# Patient Record
Sex: Female | Born: 1955 | Race: White | Hispanic: No | Marital: Married | Smoking: Never smoker
Health system: Southern US, Community
[De-identification: ages and names within clinical notes are randomized; demographics above are authoritative.]

## PROBLEM LIST (undated history)

## (undated) DIAGNOSIS — I499 Cardiac arrhythmia, unspecified: Secondary | ICD-10-CM

## (undated) DIAGNOSIS — I4891 Unspecified atrial fibrillation: Secondary | ICD-10-CM

## (undated) DIAGNOSIS — G4733 Obstructive sleep apnea (adult) (pediatric): Secondary | ICD-10-CM

## (undated) DIAGNOSIS — M1990 Unspecified osteoarthritis, unspecified site: Secondary | ICD-10-CM

## (undated) DIAGNOSIS — I480 Paroxysmal atrial fibrillation: Secondary | ICD-10-CM

## (undated) DIAGNOSIS — I4819 Other persistent atrial fibrillation: Secondary | ICD-10-CM

## (undated) DIAGNOSIS — Z8489 Family history of other specified conditions: Secondary | ICD-10-CM

## (undated) DIAGNOSIS — C50919 Malignant neoplasm of unspecified site of unspecified female breast: Secondary | ICD-10-CM

## (undated) DIAGNOSIS — Z8042 Family history of malignant neoplasm of prostate: Secondary | ICD-10-CM

## (undated) DIAGNOSIS — I890 Lymphedema, not elsewhere classified: Secondary | ICD-10-CM

## (undated) DIAGNOSIS — R112 Nausea with vomiting, unspecified: Secondary | ICD-10-CM

## (undated) DIAGNOSIS — Z9889 Other specified postprocedural states: Secondary | ICD-10-CM

## (undated) HISTORY — DX: Unspecified osteoarthritis, unspecified site: M19.90

## (undated) HISTORY — PX: ABDOMINAL HYSTERECTOMY: SHX81

## (undated) HISTORY — DX: Malignant neoplasm of unspecified site of unspecified female breast: C50.919

## (undated) HISTORY — DX: Other persistent atrial fibrillation: I48.19

## (undated) HISTORY — DX: Paroxysmal atrial fibrillation: I48.0

## (undated) HISTORY — DX: Family history of malignant neoplasm of prostate: Z80.42

## (undated) HISTORY — DX: Obstructive sleep apnea (adult) (pediatric): G47.33

## (undated) HISTORY — DX: Lymphedema, not elsewhere classified: I89.0

---

## 1999-01-14 ENCOUNTER — Other Ambulatory Visit: Admission: RE | Admit: 1999-01-14 | Discharge: 1999-01-14 | Payer: Self-pay | Admitting: Radiology

## 2003-10-16 ENCOUNTER — Other Ambulatory Visit: Admission: RE | Admit: 2003-10-16 | Discharge: 2003-10-16 | Payer: Self-pay | Admitting: Family Medicine

## 2004-02-19 ENCOUNTER — Ambulatory Visit (HOSPITAL_COMMUNITY): Admission: RE | Admit: 2004-02-19 | Discharge: 2004-02-19 | Payer: Self-pay | Admitting: Family Medicine

## 2004-11-02 ENCOUNTER — Other Ambulatory Visit: Admission: RE | Admit: 2004-11-02 | Discharge: 2004-11-02 | Payer: Self-pay | Admitting: Obstetrics and Gynecology

## 2004-11-04 ENCOUNTER — Emergency Department (HOSPITAL_COMMUNITY): Admission: EM | Admit: 2004-11-04 | Discharge: 2004-11-05 | Payer: Self-pay | Admitting: Emergency Medicine

## 2005-09-13 HISTORY — PX: KNEE ARTHROSCOPY: SHX127

## 2005-11-01 ENCOUNTER — Encounter (INDEPENDENT_AMBULATORY_CARE_PROVIDER_SITE_OTHER): Payer: Self-pay | Admitting: Specialist

## 2005-11-02 ENCOUNTER — Inpatient Hospital Stay (HOSPITAL_COMMUNITY): Admission: RE | Admit: 2005-11-02 | Discharge: 2005-11-03 | Payer: Self-pay | Admitting: Obstetrics and Gynecology

## 2005-12-07 ENCOUNTER — Emergency Department (HOSPITAL_COMMUNITY): Admission: EM | Admit: 2005-12-07 | Discharge: 2005-12-07 | Payer: Self-pay | Admitting: Emergency Medicine

## 2006-08-19 ENCOUNTER — Ambulatory Visit: Payer: Self-pay | Admitting: Oncology

## 2006-08-22 ENCOUNTER — Encounter: Admission: RE | Admit: 2006-08-22 | Discharge: 2006-08-22 | Payer: Self-pay | Admitting: Radiology

## 2006-08-24 ENCOUNTER — Ambulatory Visit (HOSPITAL_COMMUNITY): Admission: RE | Admit: 2006-08-24 | Discharge: 2006-08-24 | Payer: Self-pay | Admitting: Oncology

## 2006-08-25 ENCOUNTER — Encounter (INDEPENDENT_AMBULATORY_CARE_PROVIDER_SITE_OTHER): Payer: Self-pay | Admitting: *Deleted

## 2006-08-25 ENCOUNTER — Ambulatory Visit: Admission: RE | Admit: 2006-08-25 | Discharge: 2006-08-25 | Payer: Self-pay | Admitting: Oncology

## 2006-08-25 ENCOUNTER — Ambulatory Visit (HOSPITAL_COMMUNITY): Admission: RE | Admit: 2006-08-25 | Discharge: 2006-08-25 | Payer: Self-pay | Admitting: Oncology

## 2006-08-26 ENCOUNTER — Ambulatory Visit (HOSPITAL_COMMUNITY): Admission: RE | Admit: 2006-08-26 | Discharge: 2006-08-26 | Payer: Self-pay | Admitting: Oncology

## 2006-08-30 LAB — COMPREHENSIVE METABOLIC PANEL
ALT: 10 U/L (ref 0–35)
Albumin: 4.6 g/dL (ref 3.5–5.2)
CO2: 27 mEq/L (ref 19–32)
Glucose, Bld: 110 mg/dL — ABNORMAL HIGH (ref 70–99)
Potassium: 4.5 mEq/L (ref 3.5–5.3)
Sodium: 144 mEq/L (ref 135–145)
Total Protein: 7.1 g/dL (ref 6.0–8.3)

## 2006-08-30 LAB — LACTATE DEHYDROGENASE: LDH: 178 U/L (ref 94–250)

## 2006-08-30 LAB — CANCER ANTIGEN 27.29: CA 27.29: 35 U/mL (ref 0–39)

## 2006-08-30 LAB — CBC WITH DIFFERENTIAL/PLATELET
BASO%: 0.7 % (ref 0.0–2.0)
Eosinophils Absolute: 0.1 10*3/uL (ref 0.0–0.5)
MONO#: 0.3 10*3/uL (ref 0.1–0.9)
NEUT#: 2.2 10*3/uL (ref 1.5–6.5)
RBC: 4.49 10*6/uL (ref 3.70–5.32)
RDW: 13.2 % (ref 11.3–14.5)
WBC: 4.7 10*3/uL (ref 3.9–10.0)
lymph#: 2.1 10*3/uL (ref 0.9–3.3)

## 2006-09-03 ENCOUNTER — Encounter: Admission: RE | Admit: 2006-09-03 | Discharge: 2006-09-03 | Payer: Self-pay | Admitting: Oncology

## 2006-09-12 ENCOUNTER — Ambulatory Visit (HOSPITAL_COMMUNITY): Admission: RE | Admit: 2006-09-12 | Discharge: 2006-09-12 | Payer: Self-pay | Admitting: Oncology

## 2006-09-13 DIAGNOSIS — C50919 Malignant neoplasm of unspecified site of unspecified female breast: Secondary | ICD-10-CM

## 2006-09-13 HISTORY — PX: BREAST LUMPECTOMY: SHX2

## 2006-09-13 HISTORY — DX: Malignant neoplasm of unspecified site of unspecified female breast: C50.919

## 2006-09-14 LAB — COMPREHENSIVE METABOLIC PANEL
ALT: 19 U/L (ref 0–35)
AST: 17 U/L (ref 0–37)
Albumin: 4.3 g/dL (ref 3.5–5.2)
Alkaline Phosphatase: 97 U/L (ref 39–117)
BUN: 12 mg/dL (ref 6–23)
Calcium: 9.5 mg/dL (ref 8.4–10.5)
Chloride: 106 mEq/L (ref 96–112)
Potassium: 4.1 mEq/L (ref 3.5–5.3)
Sodium: 142 mEq/L (ref 135–145)
Total Protein: 6.9 g/dL (ref 6.0–8.3)

## 2006-09-14 LAB — CBC WITH DIFFERENTIAL/PLATELET
BASO%: 0.5 % (ref 0.0–2.0)
EOS%: 1.3 % (ref 0.0–7.0)
HCT: 38.3 % (ref 34.8–46.6)
MCH: 29.3 pg (ref 26.0–34.0)
MCHC: 33.6 g/dL (ref 32.0–36.0)
MONO#: 0.3 10*3/uL (ref 0.1–0.9)
RBC: 4.4 10*6/uL (ref 3.70–5.32)
RDW: 13.4 % (ref 11.3–14.5)
WBC: 5.6 10*3/uL (ref 3.9–10.0)
lymph#: 2.2 10*3/uL (ref 0.9–3.3)

## 2006-09-14 LAB — CANCER ANTIGEN 27.29: CA 27.29: 34 U/mL (ref 0–39)

## 2006-09-23 LAB — CBC WITH DIFFERENTIAL/PLATELET
EOS%: 3.8 % (ref 0.0–7.0)
Eosinophils Absolute: 0.1 10*3/uL (ref 0.0–0.5)
LYMPH%: 79.6 % — ABNORMAL HIGH (ref 14.0–48.0)
MCH: 29 pg (ref 26.0–34.0)
MCV: 83.7 fL (ref 81.0–101.0)
MONO%: 4.9 % (ref 0.0–13.0)
NEUT#: 0.1 10*3/uL — CL (ref 1.5–6.5)
Platelets: 150 10*3/uL (ref 145–400)
RBC: 4.6 10*6/uL (ref 3.70–5.32)

## 2006-09-29 LAB — COMPREHENSIVE METABOLIC PANEL WITH GFR
ALT: 16 U/L (ref 0–35)
AST: 18 U/L (ref 0–37)
Albumin: 4.7 g/dL (ref 3.5–5.2)
Alkaline Phosphatase: 114 U/L (ref 39–117)
BUN: 11 mg/dL (ref 6–23)
CO2: 30 meq/L (ref 19–32)
Calcium: 9.5 mg/dL (ref 8.4–10.5)
Chloride: 103 meq/L (ref 96–112)
Creatinine, Ser: 0.7 mg/dL (ref 0.40–1.20)
Glucose, Bld: 103 mg/dL — ABNORMAL HIGH (ref 70–99)
Potassium: 3.9 meq/L (ref 3.5–5.3)
Sodium: 143 meq/L (ref 135–145)
Total Bilirubin: 0.4 mg/dL (ref 0.3–1.2)
Total Protein: 7.3 g/dL (ref 6.0–8.3)

## 2006-09-29 LAB — CBC WITH DIFFERENTIAL/PLATELET
BASO%: 0.2 % (ref 0.0–2.0)
LYMPH%: 26.9 % (ref 14.0–48.0)
MCHC: 34.2 g/dL (ref 32.0–36.0)
MCV: 85.7 fL (ref 81.0–101.0)
MONO#: 0.4 10*3/uL (ref 0.1–0.9)
MONO%: 4.4 % (ref 0.0–13.0)
Platelets: 172 10*3/uL (ref 145–400)
RBC: 4.36 10*6/uL (ref 3.70–5.32)
RDW: 13 % (ref 11.3–14.5)
WBC: 9.4 10*3/uL (ref 3.9–10.0)

## 2006-10-04 ENCOUNTER — Ambulatory Visit: Payer: Self-pay | Admitting: Oncology

## 2006-10-07 LAB — CBC WITH DIFFERENTIAL/PLATELET
Basophils Absolute: 0 10*3/uL (ref 0.0–0.1)
EOS%: 0.3 % (ref 0.0–7.0)
Eosinophils Absolute: 0 10*3/uL (ref 0.0–0.5)
HGB: 11.9 g/dL (ref 11.6–15.9)
LYMPH%: 57.3 % — ABNORMAL HIGH (ref 14.0–48.0)
MCH: 29.1 pg (ref 26.0–34.0)
MCV: 85.3 fL (ref 81.0–101.0)
MONO%: 7.7 % (ref 0.0–13.0)
NEUT#: 0.5 10*3/uL — ABNORMAL LOW (ref 1.5–6.5)
NEUT%: 32.1 % — ABNORMAL LOW (ref 39.6–76.8)
Platelets: 151 10*3/uL (ref 145–400)
RDW: 11.1 % — ABNORMAL LOW (ref 11.3–14.5)

## 2006-10-13 LAB — COMPREHENSIVE METABOLIC PANEL
ALT: 34 U/L (ref 0–35)
BUN: 14 mg/dL (ref 6–23)
CO2: 25 mEq/L (ref 19–32)
Calcium: 9.1 mg/dL (ref 8.4–10.5)
Chloride: 105 mEq/L (ref 96–112)
Creatinine, Ser: 0.64 mg/dL (ref 0.40–1.20)

## 2006-10-13 LAB — CBC WITH DIFFERENTIAL/PLATELET
Basophils Absolute: 0 10*3/uL (ref 0.0–0.1)
MONO#: 0.4 10*3/uL (ref 0.1–0.9)
NEUT#: 9.3 10*3/uL — ABNORMAL HIGH (ref 1.5–6.5)
NEUT%: 79.1 % — ABNORMAL HIGH (ref 39.6–76.8)
lymph#: 2 10*3/uL (ref 0.9–3.3)

## 2006-10-21 LAB — CBC WITH DIFFERENTIAL/PLATELET
BASO%: 2.2 % — ABNORMAL HIGH (ref 0.0–2.0)
HCT: 34.4 % — ABNORMAL LOW (ref 34.8–46.6)
HGB: 12 g/dL (ref 11.6–15.9)
MCHC: 34.8 g/dL (ref 32.0–36.0)
MONO#: 0.4 10*3/uL (ref 0.1–0.9)
NEUT%: 45.7 % (ref 39.6–76.8)
RDW: 14.5 % (ref 11.3–14.5)
WBC: 5.5 10*3/uL (ref 3.9–10.0)
lymph#: 2.4 10*3/uL (ref 0.9–3.3)

## 2006-10-27 LAB — CBC WITH DIFFERENTIAL/PLATELET
BASO%: 0.6 % (ref 0.0–2.0)
EOS%: 0.9 % (ref 0.0–7.0)
HGB: 11.5 g/dL — ABNORMAL LOW (ref 11.6–15.9)
MCH: 30.3 pg (ref 26.0–34.0)
MCHC: 35.6 g/dL (ref 32.0–36.0)
MONO%: 1.1 % (ref 0.0–13.0)
RBC: 3.78 10*6/uL (ref 3.70–5.32)
RDW: 15 % — ABNORMAL HIGH (ref 11.3–14.5)
lymph#: 0.9 10*3/uL (ref 0.9–3.3)

## 2006-10-27 LAB — COMPREHENSIVE METABOLIC PANEL
ALT: 12 U/L (ref 0–35)
AST: 11 U/L (ref 0–37)
Albumin: 4.3 g/dL (ref 3.5–5.2)
Alkaline Phosphatase: 123 U/L — ABNORMAL HIGH (ref 39–117)
Calcium: 9.3 mg/dL (ref 8.4–10.5)
Chloride: 106 mEq/L (ref 96–112)
Potassium: 4.4 mEq/L (ref 3.5–5.3)
Sodium: 141 mEq/L (ref 135–145)

## 2006-11-03 LAB — COMPREHENSIVE METABOLIC PANEL
Albumin: 4.5 g/dL (ref 3.5–5.2)
CO2: 26 mEq/L (ref 19–32)
Glucose, Bld: 98 mg/dL (ref 70–99)
Potassium: 4.3 mEq/L (ref 3.5–5.3)
Sodium: 143 mEq/L (ref 135–145)
Total Bilirubin: 0.5 mg/dL (ref 0.3–1.2)
Total Protein: 7 g/dL (ref 6.0–8.3)

## 2006-11-03 LAB — CBC WITH DIFFERENTIAL/PLATELET
Eosinophils Absolute: 0.1 10*3/uL (ref 0.0–0.5)
HCT: 33.7 % — ABNORMAL LOW (ref 34.8–46.6)
LYMPH%: 22.4 % (ref 14.0–48.0)
MCH: 30 pg (ref 26.0–34.0)
MCHC: 34.7 g/dL (ref 32.0–36.0)
MCV: 86.6 fL (ref 81.0–101.0)
RDW: 16.3 % — ABNORMAL HIGH (ref 11.3–14.5)
lymph#: 1.8 10*3/uL (ref 0.9–3.3)

## 2006-11-11 LAB — CBC WITH DIFFERENTIAL/PLATELET
Eosinophils Absolute: 0 10*3/uL (ref 0.0–0.5)
MONO#: 0.1 10*3/uL (ref 0.1–0.9)
NEUT#: 2 10*3/uL (ref 1.5–6.5)
RBC: 3.62 10*6/uL — ABNORMAL LOW (ref 3.70–5.32)
RDW: 16.9 % — ABNORMAL HIGH (ref 11.3–14.5)
WBC: 3.1 10*3/uL — ABNORMAL LOW (ref 3.9–10.0)
lymph#: 0.9 10*3/uL (ref 0.9–3.3)

## 2006-11-16 ENCOUNTER — Encounter: Admission: RE | Admit: 2006-11-16 | Discharge: 2006-11-16 | Payer: Self-pay | Admitting: Oncology

## 2006-11-16 ENCOUNTER — Ambulatory Visit: Payer: Self-pay | Admitting: Oncology

## 2006-11-17 LAB — CBC WITH DIFFERENTIAL/PLATELET
Eosinophils Absolute: 0 10*3/uL (ref 0.0–0.5)
HCT: 31.6 % — ABNORMAL LOW (ref 34.8–46.6)
LYMPH%: 8.2 % — ABNORMAL LOW (ref 14.0–48.0)
MONO#: 0.1 10*3/uL (ref 0.1–0.9)
NEUT#: 11.2 10*3/uL — ABNORMAL HIGH (ref 1.5–6.5)
NEUT%: 90.8 % — ABNORMAL HIGH (ref 39.6–76.8)
Platelets: 203 10*3/uL (ref 145–400)
WBC: 12.3 10*3/uL — ABNORMAL HIGH (ref 3.9–10.0)

## 2006-11-17 LAB — COMPREHENSIVE METABOLIC PANEL
ALT: 14 U/L (ref 0–35)
Albumin: 4.8 g/dL (ref 3.5–5.2)
Alkaline Phosphatase: 120 U/L — ABNORMAL HIGH (ref 39–117)
Glucose, Bld: 108 mg/dL — ABNORMAL HIGH (ref 70–99)
Potassium: 3.8 mEq/L (ref 3.5–5.3)
Sodium: 144 mEq/L (ref 135–145)
Total Protein: 7.2 g/dL (ref 6.0–8.3)

## 2006-11-25 LAB — CBC WITH DIFFERENTIAL/PLATELET
BASO%: 0.1 % (ref 0.0–2.0)
EOS%: 0 % (ref 0.0–7.0)
MCH: 30 pg (ref 26.0–34.0)
MCHC: 34.1 g/dL (ref 32.0–36.0)
MCV: 87.9 fL (ref 81.0–101.0)
MONO%: 2.3 % (ref 0.0–13.0)
NEUT#: 19.3 10*3/uL — ABNORMAL HIGH (ref 1.5–6.5)
RBC: 3.64 10*6/uL — ABNORMAL LOW (ref 3.70–5.32)
RDW: 18.9 % — ABNORMAL HIGH (ref 11.3–14.5)

## 2006-12-01 LAB — COMPREHENSIVE METABOLIC PANEL
ALT: 20 U/L (ref 0–35)
Albumin: 4.3 g/dL (ref 3.5–5.2)
Alkaline Phosphatase: 124 U/L — ABNORMAL HIGH (ref 39–117)
CO2: 28 mEq/L (ref 19–32)
Glucose, Bld: 95 mg/dL (ref 70–99)
Potassium: 4.3 mEq/L (ref 3.5–5.3)
Sodium: 143 mEq/L (ref 135–145)
Total Protein: 6.6 g/dL (ref 6.0–8.3)

## 2006-12-01 LAB — CBC WITH DIFFERENTIAL/PLATELET
BASO%: 0.5 % (ref 0.0–2.0)
Eosinophils Absolute: 0 10*3/uL (ref 0.0–0.5)
MONO#: 0.5 10*3/uL (ref 0.1–0.9)
NEUT#: 9.7 10*3/uL — ABNORMAL HIGH (ref 1.5–6.5)
RBC: 3.39 10*6/uL — ABNORMAL LOW (ref 3.70–5.32)
RDW: 19.2 % — ABNORMAL HIGH (ref 11.3–14.5)
WBC: 12.3 10*3/uL — ABNORMAL HIGH (ref 3.9–10.0)

## 2006-12-07 LAB — CBC WITH DIFFERENTIAL/PLATELET
BASO%: 2.2 % — ABNORMAL HIGH (ref 0.0–2.0)
Eosinophils Absolute: 0 10*3/uL (ref 0.0–0.5)
LYMPH%: 23.5 % (ref 14.0–48.0)
MCHC: 35.9 g/dL (ref 32.0–36.0)
MCV: 88.8 fL (ref 81.0–101.0)
MONO%: 2 % (ref 0.0–13.0)
NEUT#: 4.8 10*3/uL (ref 1.5–6.5)
RBC: 3.16 10*6/uL — ABNORMAL LOW (ref 3.70–5.32)
RDW: 19.4 % — ABNORMAL HIGH (ref 11.3–14.5)
WBC: 6.7 10*3/uL (ref 3.9–10.0)

## 2006-12-15 LAB — CBC WITH DIFFERENTIAL/PLATELET
BASO%: 0.8 % (ref 0.0–2.0)
Basophils Absolute: 0.1 10*3/uL (ref 0.0–0.1)
EOS%: 0.6 % (ref 0.0–7.0)
HCT: 31.7 % — ABNORMAL LOW (ref 34.8–46.6)
LYMPH%: 15.9 % (ref 14.0–48.0)
MCH: 30.9 pg (ref 26.0–34.0)
MCHC: 34.5 g/dL (ref 32.0–36.0)
NEUT%: 80.3 % — ABNORMAL HIGH (ref 39.6–76.8)
Platelets: 224 10*3/uL (ref 145–400)

## 2006-12-15 LAB — COMPREHENSIVE METABOLIC PANEL
ALT: 13 U/L (ref 0–35)
AST: 15 U/L (ref 0–37)
BUN: 12 mg/dL (ref 6–23)
CO2: 24 mEq/L (ref 19–32)
Creatinine, Ser: 0.65 mg/dL (ref 0.40–1.20)
Total Bilirubin: 0.6 mg/dL (ref 0.3–1.2)

## 2006-12-23 LAB — CBC WITH DIFFERENTIAL/PLATELET
BASO%: 1.8 % (ref 0.0–2.0)
Basophils Absolute: 0 10*3/uL (ref 0.0–0.1)
Eosinophils Absolute: 0 10*3/uL (ref 0.0–0.5)
HCT: 30.5 % — ABNORMAL LOW (ref 34.8–46.6)
HGB: 10.6 g/dL — ABNORMAL LOW (ref 11.6–15.9)
LYMPH%: 46.2 % (ref 14.0–48.0)
MCHC: 34.7 g/dL (ref 32.0–36.0)
MONO#: 0.1 10*3/uL (ref 0.1–0.9)
NEUT%: 46 % (ref 39.6–76.8)
Platelets: 202 10*3/uL (ref 145–400)
WBC: 2.3 10*3/uL — ABNORMAL LOW (ref 3.9–10.0)

## 2006-12-29 LAB — COMPREHENSIVE METABOLIC PANEL
ALT: 19 U/L (ref 0–35)
AST: 18 U/L (ref 0–37)
BUN: 10 mg/dL (ref 6–23)
Calcium: 8.9 mg/dL (ref 8.4–10.5)
Creatinine, Ser: 0.57 mg/dL (ref 0.40–1.20)
Total Bilirubin: 0.7 mg/dL (ref 0.3–1.2)

## 2006-12-29 LAB — CBC WITH DIFFERENTIAL/PLATELET
BASO%: 1.4 % (ref 0.0–2.0)
Basophils Absolute: 0 10*3/uL (ref 0.0–0.1)
EOS%: 1.9 % (ref 0.0–7.0)
HCT: 31.6 % — ABNORMAL LOW (ref 34.8–46.6)
HGB: 11 g/dL — ABNORMAL LOW (ref 11.6–15.9)
LYMPH%: 45.2 % (ref 14.0–48.0)
MCH: 30.8 pg (ref 26.0–34.0)
MCHC: 34.7 g/dL (ref 32.0–36.0)
MCV: 88.7 fL (ref 81.0–101.0)
MONO%: 10.7 % (ref 0.0–13.0)
NEUT%: 40.8 % (ref 39.6–76.8)
Platelets: 240 10*3/uL (ref 145–400)

## 2007-01-02 ENCOUNTER — Ambulatory Visit: Payer: Self-pay | Admitting: Oncology

## 2007-01-05 LAB — COMPREHENSIVE METABOLIC PANEL
ALT: 20 U/L (ref 0–35)
AST: 23 U/L (ref 0–37)
Alkaline Phosphatase: 83 U/L (ref 39–117)
CO2: 26 mEq/L (ref 19–32)
Sodium: 142 mEq/L (ref 135–145)
Total Bilirubin: 0.8 mg/dL (ref 0.3–1.2)
Total Protein: 6.5 g/dL (ref 6.0–8.3)

## 2007-01-05 LAB — CBC WITH DIFFERENTIAL/PLATELET
BASO%: 1.4 % (ref 0.0–2.0)
Basophils Absolute: 0 10*3/uL (ref 0.0–0.1)
EOS%: 2.3 % (ref 0.0–7.0)
HCT: 35 % (ref 34.8–46.6)
HGB: 12 g/dL (ref 11.6–15.9)
LYMPH%: 50.8 % — ABNORMAL HIGH (ref 14.0–48.0)
MCH: 30.4 pg (ref 26.0–34.0)
MCHC: 34.3 g/dL (ref 32.0–36.0)
MONO#: 0.3 10*3/uL (ref 0.1–0.9)
NEUT%: 35.6 % — ABNORMAL LOW (ref 39.6–76.8)
Platelets: 253 10*3/uL (ref 145–400)
lymph#: 1.4 10*3/uL (ref 0.9–3.3)

## 2007-01-06 ENCOUNTER — Encounter: Admission: RE | Admit: 2007-01-06 | Discharge: 2007-01-06 | Payer: Self-pay | Admitting: Oncology

## 2007-01-12 LAB — COMPREHENSIVE METABOLIC PANEL
ALT: 55 U/L — ABNORMAL HIGH (ref 0–35)
Alkaline Phosphatase: 137 U/L — ABNORMAL HIGH (ref 39–117)
Creatinine, Ser: 0.59 mg/dL (ref 0.40–1.20)
Sodium: 141 mEq/L (ref 135–145)
Total Bilirubin: 0.5 mg/dL (ref 0.3–1.2)
Total Protein: 6.9 g/dL (ref 6.0–8.3)

## 2007-01-12 LAB — CBC WITH DIFFERENTIAL/PLATELET
BASO%: 0.2 % (ref 0.0–2.0)
LYMPH%: 20.4 % (ref 14.0–48.0)
MCH: 30.8 pg (ref 26.0–34.0)
MCHC: 34.3 g/dL (ref 32.0–36.0)
MCV: 89.8 fL (ref 81.0–101.0)
MONO%: 1 % (ref 0.0–13.0)
Platelets: 181 10*3/uL (ref 145–400)
RBC: 4.04 10*6/uL (ref 3.70–5.32)

## 2007-02-01 ENCOUNTER — Encounter (INDEPENDENT_AMBULATORY_CARE_PROVIDER_SITE_OTHER): Payer: Self-pay | Admitting: General Surgery

## 2007-02-01 ENCOUNTER — Ambulatory Visit (HOSPITAL_BASED_OUTPATIENT_CLINIC_OR_DEPARTMENT_OTHER): Admission: RE | Admit: 2007-02-01 | Discharge: 2007-02-01 | Payer: Self-pay | Admitting: General Surgery

## 2007-02-22 ENCOUNTER — Ambulatory Visit: Payer: Self-pay | Admitting: Oncology

## 2007-02-24 LAB — COMPREHENSIVE METABOLIC PANEL
Alkaline Phosphatase: 82 U/L (ref 39–117)
BUN: 9 mg/dL (ref 6–23)
Creatinine, Ser: 0.63 mg/dL (ref 0.40–1.20)
Glucose, Bld: 97 mg/dL (ref 70–99)
Sodium: 143 mEq/L (ref 135–145)
Total Bilirubin: 0.6 mg/dL (ref 0.3–1.2)
Total Protein: 6.8 g/dL (ref 6.0–8.3)

## 2007-02-24 LAB — CBC WITH DIFFERENTIAL/PLATELET
Eosinophils Absolute: 0.1 10*3/uL (ref 0.0–0.5)
HCT: 38.4 % (ref 34.8–46.6)
LYMPH%: 44.1 % (ref 14.0–48.0)
MCV: 86.8 fL (ref 81.0–101.0)
MONO%: 7 % (ref 0.0–13.0)
NEUT#: 2 10*3/uL (ref 1.5–6.5)
NEUT%: 46.9 % (ref 39.6–76.8)
Platelets: 187 10*3/uL (ref 145–400)
RBC: 4.42 10*6/uL (ref 3.70–5.32)

## 2007-02-24 LAB — FSH/LH: LH: 48.9 m[IU]/mL

## 2007-02-27 ENCOUNTER — Ambulatory Visit (HOSPITAL_COMMUNITY): Admission: RE | Admit: 2007-02-27 | Discharge: 2007-02-27 | Payer: Self-pay | Admitting: Oncology

## 2007-03-06 ENCOUNTER — Ambulatory Visit: Admission: RE | Admit: 2007-03-06 | Discharge: 2007-05-24 | Payer: Self-pay | Admitting: Radiation Oncology

## 2007-03-06 LAB — ESTRADIOL, ULTRA SENS: Estradiol, Ultra Sensitive: 17 pg/mL

## 2007-05-19 ENCOUNTER — Ambulatory Visit: Payer: Self-pay | Admitting: Oncology

## 2007-05-23 LAB — CBC WITH DIFFERENTIAL/PLATELET
BASO%: 0.8 % (ref 0.0–2.0)
EOS%: 1.6 % (ref 0.0–7.0)
HCT: 34.9 % (ref 34.8–46.6)
MCH: 30.1 pg (ref 26.0–34.0)
MCHC: 35.6 g/dL (ref 32.0–36.0)
MONO#: 0.2 10*3/uL (ref 0.1–0.9)
NEUT%: 42 % (ref 39.6–76.8)
RBC: 4.12 10*6/uL (ref 3.70–5.32)
RDW: 14.7 % — ABNORMAL HIGH (ref 11.3–14.5)
WBC: 2 10*3/uL — ABNORMAL LOW (ref 3.9–10.0)
lymph#: 0.9 10*3/uL (ref 0.9–3.3)

## 2007-05-23 LAB — LACTATE DEHYDROGENASE: LDH: 177 U/L (ref 94–250)

## 2007-05-23 LAB — COMPREHENSIVE METABOLIC PANEL
ALT: 18 U/L (ref 0–35)
AST: 22 U/L (ref 0–37)
CO2: 22 mEq/L (ref 19–32)
Calcium: 9.3 mg/dL (ref 8.4–10.5)
Chloride: 106 mEq/L (ref 96–112)
Creatinine, Ser: 0.77 mg/dL (ref 0.40–1.20)
Potassium: 3.6 mEq/L (ref 3.5–5.3)
Sodium: 142 mEq/L (ref 135–145)
Total Protein: 7.1 g/dL (ref 6.0–8.3)

## 2007-07-10 ENCOUNTER — Ambulatory Visit: Payer: Self-pay | Admitting: Oncology

## 2007-07-12 LAB — COMPREHENSIVE METABOLIC PANEL
ALT: 14 U/L (ref 0–35)
Albumin: 4.6 g/dL (ref 3.5–5.2)
CO2: 26 mEq/L (ref 19–32)
Calcium: 9.4 mg/dL (ref 8.4–10.5)
Chloride: 105 mEq/L (ref 96–112)
Creatinine, Ser: 0.66 mg/dL (ref 0.40–1.20)
Sodium: 142 mEq/L (ref 135–145)
Total Protein: 6.8 g/dL (ref 6.0–8.3)

## 2007-07-12 LAB — CBC WITH DIFFERENTIAL/PLATELET
BASO%: 0.4 % (ref 0.0–2.0)
HCT: 37.3 % (ref 34.8–46.6)
LYMPH%: 40 % (ref 14.0–48.0)
MCHC: 35.3 g/dL (ref 32.0–36.0)
MONO#: 0.3 10*3/uL (ref 0.1–0.9)
NEUT%: 51.2 % (ref 39.6–76.8)
Platelets: 194 10*3/uL (ref 145–400)
WBC: 4.1 10*3/uL (ref 3.9–10.0)

## 2007-07-12 LAB — CHCC SMEAR

## 2007-07-12 LAB — LACTATE DEHYDROGENASE: LDH: 159 U/L (ref 94–250)

## 2007-08-21 LAB — CANCER ANTIGEN 27.29: CA 27.29: 28 U/mL (ref 0–39)

## 2007-08-21 LAB — MORPHOLOGY: RBC Comments: NORMAL

## 2007-08-21 LAB — CBC WITH DIFFERENTIAL/PLATELET
EOS%: 2.5 % (ref 0.0–7.0)
MCH: 29.9 pg (ref 26.0–34.0)
MCV: 85.6 fL (ref 81.0–101.0)
MONO%: 6.8 % (ref 0.0–13.0)
RBC: 4.07 10*6/uL (ref 3.70–5.32)
RDW: 12.7 % (ref 11.3–14.5)

## 2007-08-21 LAB — COMPREHENSIVE METABOLIC PANEL
Albumin: 4 g/dL (ref 3.5–5.2)
Alkaline Phosphatase: 104 U/L (ref 39–117)
BUN: 13 mg/dL (ref 6–23)
CO2: 28 mEq/L (ref 19–32)
Glucose, Bld: 90 mg/dL (ref 70–99)
Potassium: 4.3 mEq/L (ref 3.5–5.3)
Total Bilirubin: 0.6 mg/dL (ref 0.3–1.2)

## 2007-08-21 LAB — LACTATE DEHYDROGENASE: LDH: 148 U/L (ref 94–250)

## 2007-08-28 ENCOUNTER — Ambulatory Visit: Payer: Self-pay | Admitting: Oncology

## 2007-12-07 ENCOUNTER — Encounter: Admission: RE | Admit: 2007-12-07 | Discharge: 2007-12-07 | Payer: Self-pay | Admitting: Orthopedic Surgery

## 2008-01-12 ENCOUNTER — Ambulatory Visit: Payer: Self-pay | Admitting: Oncology

## 2008-01-16 LAB — CBC WITH DIFFERENTIAL/PLATELET
Basophils Absolute: 0.1 10*3/uL (ref 0.0–0.1)
Eosinophils Absolute: 0.1 10*3/uL (ref 0.0–0.5)
HGB: 13.2 g/dL (ref 11.6–15.9)
LYMPH%: 42 % (ref 14.0–48.0)
MCV: 85.7 fL (ref 81.0–101.0)
MONO%: 7.9 % (ref 0.0–13.0)
NEUT#: 1.6 10*3/uL (ref 1.5–6.5)
Platelets: 194 10*3/uL (ref 145–400)
RBC: 4.45 10*6/uL (ref 3.70–5.32)

## 2008-01-17 LAB — VITAMIN D 25 HYDROXY (VIT D DEFICIENCY, FRACTURES): Vit D, 25-Hydroxy: 30 ng/mL (ref 30–89)

## 2008-01-17 LAB — COMPREHENSIVE METABOLIC PANEL
Alkaline Phosphatase: 97 U/L (ref 39–117)
BUN: 14 mg/dL (ref 6–23)
Glucose, Bld: 92 mg/dL (ref 70–99)
Total Bilirubin: 0.7 mg/dL (ref 0.3–1.2)

## 2008-01-17 LAB — CANCER ANTIGEN 27.29: CA 27.29: 39 U/mL (ref 0–39)

## 2008-07-09 ENCOUNTER — Ambulatory Visit: Payer: Self-pay | Admitting: Oncology

## 2008-07-11 ENCOUNTER — Encounter: Admission: RE | Admit: 2008-07-11 | Discharge: 2008-07-11 | Payer: Self-pay | Admitting: Oncology

## 2008-07-11 LAB — CBC WITH DIFFERENTIAL/PLATELET
BASO%: 0.6 % (ref 0.0–2.0)
Basophils Absolute: 0 10*3/uL (ref 0.0–0.1)
HCT: 38.7 % (ref 34.8–46.6)
HGB: 13.4 g/dL (ref 11.6–15.9)
LYMPH%: 42.5 % (ref 14.0–48.0)
MCH: 30.6 pg (ref 26.0–34.0)
MCHC: 34.7 g/dL (ref 32.0–36.0)
MONO#: 0.2 10*3/uL (ref 0.1–0.9)
NEUT%: 46.7 % (ref 39.6–76.8)
Platelets: 168 10*3/uL (ref 145–400)
WBC: 3 10*3/uL — ABNORMAL LOW (ref 3.9–10.0)
lymph#: 1.3 10*3/uL (ref 0.9–3.3)

## 2008-07-12 LAB — COMPREHENSIVE METABOLIC PANEL
ALT: 18 U/L (ref 0–35)
BUN: 21 mg/dL (ref 6–23)
CO2: 26 mEq/L (ref 19–32)
Calcium: 9.5 mg/dL (ref 8.4–10.5)
Chloride: 106 mEq/L (ref 96–112)
Creatinine, Ser: 0.7 mg/dL (ref 0.40–1.20)
Total Bilirubin: 0.8 mg/dL (ref 0.3–1.2)

## 2008-07-12 LAB — LACTATE DEHYDROGENASE: LDH: 137 U/L (ref 94–250)

## 2008-10-30 ENCOUNTER — Emergency Department (HOSPITAL_COMMUNITY): Admission: EM | Admit: 2008-10-30 | Discharge: 2008-10-30 | Payer: Self-pay | Admitting: Family Medicine

## 2009-01-10 ENCOUNTER — Ambulatory Visit: Payer: Self-pay | Admitting: Oncology

## 2009-08-12 ENCOUNTER — Ambulatory Visit: Payer: Self-pay | Admitting: Oncology

## 2009-08-15 LAB — CBC WITH DIFFERENTIAL/PLATELET
Basophils Absolute: 0 10*3/uL (ref 0.0–0.1)
EOS%: 2.1 % (ref 0.0–7.0)
Eosinophils Absolute: 0.1 10*3/uL (ref 0.0–0.5)
HCT: 39.3 % (ref 34.8–46.6)
HGB: 13.2 g/dL (ref 11.6–15.9)
MCH: 29.7 pg (ref 25.1–34.0)
MCV: 88.5 fL (ref 79.5–101.0)
MONO%: 6.8 % (ref 0.0–14.0)
NEUT%: 40 % (ref 38.4–76.8)
lymph#: 2 10*3/uL (ref 0.9–3.3)

## 2009-08-16 LAB — COMPREHENSIVE METABOLIC PANEL
AST: 17 U/L (ref 0–37)
BUN: 14 mg/dL (ref 6–23)
Calcium: 9.4 mg/dL (ref 8.4–10.5)
Chloride: 106 mEq/L (ref 96–112)
Creatinine, Ser: 0.7 mg/dL (ref 0.40–1.20)
Glucose, Bld: 96 mg/dL (ref 70–99)

## 2009-08-16 LAB — LACTATE DEHYDROGENASE: LDH: 155 U/L (ref 94–250)

## 2010-02-24 ENCOUNTER — Ambulatory Visit: Payer: Self-pay | Admitting: Oncology

## 2010-02-26 LAB — CBC WITH DIFFERENTIAL/PLATELET
BASO%: 0.5 % (ref 0.0–2.0)
Eosinophils Absolute: 0 10*3/uL (ref 0.0–0.5)
HGB: 13.2 g/dL (ref 11.6–15.9)
LYMPH%: 45.7 % (ref 14.0–49.7)
MCHC: 34.8 g/dL (ref 31.5–36.0)
NEUT#: 1.8 10*3/uL (ref 1.5–6.5)
NEUT%: 45.2 % (ref 38.4–76.8)
Platelets: 181 10*3/uL (ref 145–400)

## 2010-02-27 LAB — COMPREHENSIVE METABOLIC PANEL
ALT: 17 U/L (ref 0–35)
Albumin: 4.5 g/dL (ref 3.5–5.2)
Alkaline Phosphatase: 76 U/L (ref 39–117)
BUN: 16 mg/dL (ref 6–23)
Calcium: 9.7 mg/dL (ref 8.4–10.5)
Chloride: 104 mEq/L (ref 96–112)
Potassium: 4.8 mEq/L (ref 3.5–5.3)
Sodium: 143 mEq/L (ref 135–145)
Total Protein: 7.1 g/dL (ref 6.0–8.3)

## 2010-02-27 LAB — VITAMIN D 25 HYDROXY (VIT D DEFICIENCY, FRACTURES): Vit D, 25-Hydroxy: 31 ng/mL (ref 30–89)

## 2010-09-02 ENCOUNTER — Ambulatory Visit: Payer: Self-pay | Admitting: Oncology

## 2010-09-02 LAB — CBC WITH DIFFERENTIAL/PLATELET
BASO%: 0.8 % (ref 0.0–2.0)
Basophils Absolute: 0 10*3/uL (ref 0.0–0.1)
Eosinophils Absolute: 0.1 10*3/uL (ref 0.0–0.5)
HGB: 12.8 g/dL (ref 11.6–15.9)
LYMPH%: 40.1 % (ref 14.0–49.7)
MONO%: 9.3 % (ref 0.0–14.0)
Platelets: 170 10*3/uL (ref 145–400)
RBC: 4.16 10*6/uL (ref 3.70–5.45)
WBC: 3.3 10*3/uL — ABNORMAL LOW (ref 3.9–10.3)

## 2010-09-03 LAB — VITAMIN D 25 HYDROXY (VIT D DEFICIENCY, FRACTURES): Vit D, 25-Hydroxy: 30 ng/mL (ref 30–89)

## 2010-09-03 LAB — COMPREHENSIVE METABOLIC PANEL
AST: 23 U/L (ref 0–37)
BUN: 22 mg/dL (ref 6–23)
Calcium: 9.2 mg/dL (ref 8.4–10.5)
Glucose, Bld: 107 mg/dL — ABNORMAL HIGH (ref 70–99)
Total Bilirubin: 0.5 mg/dL (ref 0.3–1.2)
Total Protein: 6.3 g/dL (ref 6.0–8.3)

## 2010-09-03 LAB — CANCER ANTIGEN 27.29: CA 27.29: 34 U/mL (ref 0–39)

## 2010-09-03 LAB — LACTATE DEHYDROGENASE: LDH: 162 U/L (ref 94–250)

## 2010-09-24 ENCOUNTER — Encounter
Admission: RE | Admit: 2010-09-24 | Discharge: 2010-09-24 | Payer: Self-pay | Source: Home / Self Care | Attending: Oncology | Admitting: Oncology

## 2010-10-04 ENCOUNTER — Encounter: Payer: Self-pay | Admitting: Oncology

## 2010-12-29 LAB — POCT URINALYSIS DIP (DEVICE)
Glucose, UA: NEGATIVE mg/dL
Ketones, ur: NEGATIVE mg/dL
Specific Gravity, Urine: 1.02 (ref 1.005–1.030)

## 2011-01-29 NOTE — Discharge Summary (Signed)
NAME:  Linda Foster, Linda Foster NO.:  [id number]   MEDICAL RECORD NO.:  [id number]          PATIENT TYPE:  INP   LOCATION:  1610                         FACILITY:  [facility]   PHYSICIAN:  Huel Cote, M.D. DATE OF BIRTH:  01/31/1956   DATE OF ADMISSION:  11/01/2005  DATE OF DISCHARGE:  11/03/2005                                 DISCHARGE SUMMARY   DISCHARGE DIAGNOSES:  1.  Abnormal uterine bleeding.  2.  Menorrhagia.  3.  Dysmenorrhea.  4.  Status post attempted laparoscopic vaginal hysterectomy with conversion      to total abdominal hysterectomy.   DISCHARGE MEDICATIONS:  1.  Motrin 600 mg p.o. q.6h.  2.  Percocet 1-2 tablets p.o. every 4 hours p.r.n.  3.  Chromagen Plus 1 p.o. daily.   DISCHARGE FOLLOW UP:  Patient is to follow up in the office in two days for  staple removal and hemoglobin check.   HOSPITAL COURSE:  The patient is a 55 year old female who came in for a  scheduled attempted laparoscopic vaginal hysterectomy, which did require  conversion to total abdominal hysterectomy to complete the surgery.  She was  admitted for routine postoperative care.  The patient had a very good  postoperative course.  On postop day #1, she was ambulating, tolerating  clears, and passing flatus.  She had minimal vaginal bleeding.  Her  hemoglobin was noted to be 7.4, down from an initial preop hemoglobin of  essentially 10; however, she was asymptomatic with this drop, which was  expected secondary to some heavier bleeding at the time of surgery.  Her  abdomen was soft, and her incision was clear.  She advanced her diet and by  postop day #2, was feeling quite well.  She was ambulating without  difficulty.  Had no dizziness or symptoms from her anemia.  Her final  hemoglobin was 6.7 on the day of discharge; however, clinically and all  other ways, the patient was quite stable and was placed on Chromogen Plus  daily to help rebuild this count after discharge more  quickly.  She was  tolerating her pain, which was p.o. Motrin; however, was given prescriptions  for both Motrin and Percocet.  She will follow up in the office in two days  for staple removal and a repeat hemoglobin.      Huel Cote, M.D.  Electronically Signed     KR/MEDQ  D:  11/03/2005  T:  11/03/2005  Job:  [id number]

## 2011-01-29 NOTE — Op Note (Signed)
NAME:  Linda Foster, Linda Foster                 ACCOUNT NO.:  [id number]   MEDICAL RECORD NO.:  [id number]          PATIENT TYPE:  AMB   LOCATION:  DAY                          [facility]AC[facility]   PHYSICIAN:  Huel Cote, M.D. DATE OF BIRTH:  1956-03-17   DATE OF PROCEDURE:  11/01/2005  DATE OF DISCHARGE:                                 OPERATIVE REPORT   PREOPERATIVE DIAGNOSES:  1.  Abnormal uterine bleeding.  2.  Possible adenomyosis.   POSTOPERATIVE DIAGNOSES:  1.  Abnormal uterine bleeding.  2.  Possible adenomyosis.   PROCEDURE:  1.  Attempted laparoscopic assisted vaginal hysterectomy.  2.  Total abdominal hysterectomy.   SURGEON:  Dr. Huel Cote.   ASSISTANT:  Dr. Lavina Hamman.   ANESTHESIA:  General.   SPECIMENS:  Uterus and cervix were sent.   ESTIMATED BLOOD LOSS:  500 mL.   IV FLUIDS:  2700 mL.   URINE OUTPUT:  300 mL.   FINDINGS:  The uterus was 12-14 weeks in size, normal ovaries and tubes  bilaterally. The uterus was slightly immobile at the anterior cervical  junction with some scarring of the round ligaments which shortened them.  Otherwise her other anatomy was normal   DESCRIPTION OF PROCEDURE:  The patient was taken to the operating room where  general anesthesia was obtained without difficulty. She was then prepped and  draped in the normal sterile fashion in the dorsal lithotomy position. With  the Foley catheter in place, attention was then turned to the umbilicus  which was injected with 0.25% Marcaine solution and a small incision made  with scalpel. A 5 mm nonbladed trocar was then utilized after the Veress  needle was introduced and pneumoperitoneum obtained with approximately 1/2  liter CO2 gas. The nonbladed trocar was placed within and the peritoneal  cavity entered. The camera was then introduced and the abdomen and pelvis  were inspected with findings as previously stated. Two additional 5 mm  trocars were placed in each upper to mid  quadrant with the 0.25% Marcaine  utilized to inject at each site. The trocars were placed under direct  visualization with no active bleeding noted. The cornua on the patient's  left was then grasped with an atraumatic grasper and retracted medially. The  fallopian tube and the utero-ovarian ligament were then taken down with the  Harmonic 5 mm scalpel with good hemostasis noted. This was slightly  difficult due to the bulky nature of the uterus and required some retraction  medially. Attention was then turned to the round ligament which was somewhat  shortened and had the uterus scarred somewhat to the anterior abdominal wall  due to the nature of the shortness. The round ligament was then taken down  with the harmonic scalpel as well. At this point, there was some bleeding  noted along the area of the round ligament and the left lateral pelvic wall.  The harmonic scalpel was utilized to control this and was able to slow the  bleeding to a small ooze. At this point attention was turned to the  patient's right cornua which  was grasped with an atraumatic grasper and  retracted medially. The fallopian tube, utero-ovarian ligament and round  ligament were then transected with the harmonic scalpel. This dissection was  carried down to the level of the bladder flap which was then opened and the  bladder flap successfully dissected away laparoscopically. This dissection  appeared to be optimal at this point. Attention was returned to the left  however, due to bleeding, no further dissection could be performed along the  left anterior lateral junction of the uterus with the abdominal wall. At  this point, attention was turned to the vagina where the retractors were  placed within and Jacobson tenaculums were placed on the cervix. The cervix  itself was felt to be still fairly immobile and the retraction provided  minimal exposure. The Bovie cautery was utilized to attempt to  circumferentially  open the mucosa around the cervix, however this could not  be well visualized posteriorly secondary to the narrow nature of the vagina  as well as the poor visualization. At this point, it appeared that there  would not be adequate visualization to proceed with the dissection vaginally  and the uterine arteries could not be accessed to gain control of the blood  supply in order to core the uterus or fragment it to remove it given its  larger size. Therefore the vaginal approach was abandoned and the decision  made to proceed abdominally. All of the gloves and instruments were changed  as appropriate to proceed abdominally and the trocars were removed. A  Pfannenstiel skin incision was made approximately 2 cm above the symphysis  pubis and carried through to the underlying layer of fascia by sharp  dissection and Bovie cautery. The fascia was then opened in the midline and  the incision extended laterally with Mayo scissors. The inferior aspect of  the incision was grasped with Kocher clamps, elevated and dissected off the  underlying rectus muscles in a similar fashion. The superior fascia was  elevated off the rectus muscles, the rectus muscles were separated in the  midline and the peritoneal cavity entered bluntly. The peritoneal incision  was then extended both superiorly and inferiorly. At this point, it became  apparent that there was a significant amount of blood in the abdomen which  apparently had been collecting after the laparoscopic portion had been  abandoned. This appeared to be approximately 250 mL of hemoperitoneum which  was removed. The source of bleeding was quickly identified to be the  patient's left side wall which had been a difficult dissection all along. At  this point, the large wound retractor was placed within the patient's  abdomen and the bowel packed away with moist laparotomy sponges. The bleeding pedicle was grasped with a parametrial clamp and adequate   hemostasis obtained. This was then suture ligated with #0 Vicryl and no  further substantial active bleeding noted. With towel clips at each cornua  of the uterus, the uterus was elevated through the incision and parametrial  clamps utilized to take down each bite sequentially alongside the cervix  down to the level of the external os. Each step was secured with suture  ligature of zero Vicryl. The vaginal cuff itself was entered and each angle  secured with zero Vicryl suture. These were held in a hemostat. The cuff  itself was identified and the uterus and cervix completely amputated from  that area. The cuff was then completely closed with zero Vicryl in several  figure-of-eight sutures. At this  point, there was a small amount of bleeding  noted between the posterior peritoneal edge and the cuff itself. This was  closed with a 3-0 Vicryl suture, no further active bleeding at the cuff was  noted. The abdomen and pelvis were irrigated x2 and all pedicles closely  inspected. The remaining pedicles on the right and left ovary and utero-  ovarian ligaments were additionally cauterized for extra protection though  no significant active bleeding was noted. The cuff itself appeared to be  hemostatic and therefore the decision was made to close the patient. All  instruments and sponges were removed from the patient's abdomen and the  fascia was then closed with zero Vicryl in a running fashion. The  subcutaneous  tissue was closed with 3-0 Vicryl in a running stitch and the skin was  closed with staples. Each trocar site was also additionally closed at the  skin with a 3-0 Vicryl in a subcuticular stitch. Sponge, lap and needle  counts were correct x2 and the patient was awakened and taken to the  recovery room in stable condition.      Huel Cote, M.D.  Electronically Signed     KR/MEDQ  D:  11/01/2005  T:  11/02/2005  Job:  [id number]

## 2011-01-29 NOTE — Op Note (Signed)
NAME:  Linda Foster, Linda Foster                 ACCOUNT NO.:  [id number]   MEDICAL RECORD NO.:  [id number]          PATIENT TYPE:  AMB   LOCATION:  DSC                          FACILITY:  [facility]   PHYSICIAN:  Rose Phi. Maple Hudson, M.D.   DATE OF BIRTH:  02-18-1956   DATE OF PROCEDURE:  02/01/2007  DATE OF DISCHARGE:                               OPERATIVE REPORT   PREOPERATIVE DIAGNOSIS:  Cancer of left breast.   POSTOPERATIVE DIAGNOSIS:  Cancer of left breast.   OPERATION:  1. Left partial mastectomy with needle localization and specimen      mammogram.  2. Left axillary lymph node dissection.   SURGEON:  Rose Phi. Maple Hudson, M.D.   ANESTHESIA:  General.   OPERATIVE PROCEDURE:  This 55 year old female had originally presented  in early December with large palpable mass in the upper outer quadrant  of her left breast.  She was treated with neoadjuvant chemotherapy and  is now prepared for definitive procedure.  She has had a dramatic  clinical response with no palpable lesion in the breast and negative  breast MRI.   Prior to coming to the operating room, a localizing wire had been placed  in the upper outer quadrant of her left breast, the site of the tumor  and where the clip had been placed.   After suitable general anesthesia was induced, the patient was placed in  the supine position with the arms extended on the arm board.  The left  breast and axilla were prepped and draped in the usual fashion.   A curved incision was then made in the upper outer quadrant of the left  breast using the previously placed wire as a guide.  A wide excision of  the guide wire and breast tissue was carried out.  Specimen is oriented  for the pathologist and then submitted to the radiologist for specimen  mammogram.  Specimen mammogram confirmed the removal of the clip.   While that was being evaluated, a transverse axillary incision was made  with dissection through the subcutaneous tissue to the  clavipectoral  fascia.  We incised the fascia and then incised along the pectoralis  major muscle up to the level of the axillary vein and incised the fascia  over the vein.  This exposed the axillary vein and then by dissecting  along the pectoralis minor and retracting that, we dissected all the  tissue inferior to the vein and from behind the pectoralis minor muscle  as the axillary dissection.  The long thoracic thoracodorsal nerves were  identified and preserved.  Other vessels and cutaneous nerves were  clipped and divided.   Following the completion of the removal of the axillary content, we had  good hemostasis.  We thoroughly irrigated the field with saline.  One 106-  [place] Blake drain was inserted and brought out through a separate stab  wound.  The incision was closed in two layers of 3-0 Vicryl and  subcuticular 4-0 Monocryl with Dermabond.   With appropriate radiological report and good hemostasis, we then closed  the lumpectomy incision in two  layers with 3-0 Vicryl and the  subcuticular 4-0 Monocryl with Dermabond.   Light dressings were applied and the patient transferred to the recovery  room in satisfactory condition having tolerated the procedure well.      Rose Phi. Maple Hudson, M.D.  Electronically Signed     PRY/MEDQ  D:  02/01/2007  T:  02/01/2007  Job:  [id number]

## 2011-01-29 NOTE — H&P (Signed)
NAME:  Linda Foster, HOTARD NO.:  [id number]   MEDICAL RECORD NO.:  [id number]           PATIENT TYPE:   LOCATION:                                 FACILITY:   PHYSICIAN:  Huel Cote, M.D.      DATE OF BIRTH:   DATE OF ADMISSION:  11/01/2005  DATE OF DISCHARGE:                                HISTORY & PHYSICAL   Date of surgery to take place is November 01, 2005 at 7:30 a.m. at the  [facility].   The patient is a 55 year old, G3, P3, who has had an ongoing problem with  abnormal uterine bleeding for approximately six months' duration.  The  patient had initially problems with menorrhagia and flooding, however, she  was placed on birth control pills which corrected the problem temporarily,  however, approximately three to four months ago she began to have daily  bleeding and multiple attempts at adjusting pills were unsuccessful.  At  that point, the patient wishes definitive surgical therapy.  Ultrasound  reveals a globular uterus, approximately 10 to 12 weeks' in size and is  possibly consistent with adenomyosis.  She has had a workup with endometrial  biopsy and labs which were normal and for this reason wishes to proceed with  definitive therapy.   PAST MEDICAL HISTORY:  Superficial edema in her lower extremities which  responded well to Lasix.   PAST SURGICAL HISTORY:  Three cesarean sections.   PAST GYNECOLOGIC HISTORY:  No abnormal Pap smears.   PAST OBSTETRICAL HISTORY:  Just the three C-sections as stated.   CURRENT MEDICATIONS:  1.  Lasix 40 mg every day p.r.n.  2.  Loestrin __________  birth control pills.   She has no family history breast cancer, colon cancer, and does have some  heart disease in her father in his 32s.  She has had a recent mammogram, in  November 2006, which was normal.   PHYSICAL EXAMINATION:  VITAL SIGNS:  Height is 5 foot 6 inches, weight is  203 pounds, blood pressure is 148/90.  CARDIAC:  Regular rate and  rhythm.  LUNGS:  Clear.  ABDOMEN:  Soft and nontender.  BREASTS:  No masses, discharge, or adenopathy noted.  PELVIC:  On speculum exam the patient has normal external genitalia.  The  cervix has fair descensus and no lesions noted.  The uterus is globular and  approximately 10 to 12 weeks' in size.  Adnexa have no masses palpable.   The patient was counseled as to risks and benefits of proceeding with  hysterectomy including bleeding, infection, and possible damage to  bowel/bladder.  We discussed all possible options at approach and feel that  a laparoscopic-assisted vaginal hysterectomy would be the best approach.  However, if this could not be completed successfully might have to be  converted into an abdominal hysterectomy, given the size of the uterus and  her previous cesarean sections with no vaginal deliveries.  The patient  understands that should it be difficult to complete the procedure with a  vaginal approach, we will convert to an abdominal approach  and she will have  an incision which will be  slightly above her previous cesarean section incisions as those are right on  her pubic bone.  She is amenable to this and also desires to retain her  ovaries if they appear normal.  Again, the patient is agreeable to  proceeding with the surgery as stated.      Huel Cote, M.D.  Electronically Signed     KR/MEDQ  D:  10/22/2005  T:  10/22/2005  Job:  [id number]   cc:   [facility]  Fax: [phone]

## 2011-02-05 ENCOUNTER — Other Ambulatory Visit: Payer: Self-pay | Admitting: Oncology

## 2011-02-05 ENCOUNTER — Encounter (HOSPITAL_BASED_OUTPATIENT_CLINIC_OR_DEPARTMENT_OTHER): Payer: PRIVATE HEALTH INSURANCE | Admitting: Oncology

## 2011-02-05 DIAGNOSIS — C50419 Malignant neoplasm of upper-outer quadrant of unspecified female breast: Secondary | ICD-10-CM

## 2011-02-05 DIAGNOSIS — Z171 Estrogen receptor negative status [ER-]: Secondary | ICD-10-CM

## 2011-02-05 LAB — CBC WITH DIFFERENTIAL/PLATELET
BASO%: 0.7 % (ref 0.0–2.0)
Basophils Absolute: 0 10*3/uL (ref 0.0–0.1)
EOS%: 2 % (ref 0.0–7.0)
HCT: 37.1 % (ref 34.8–46.6)
HGB: 12.7 g/dL (ref 11.6–15.9)
MCH: 30.3 pg (ref 25.1–34.0)
MCHC: 34.1 g/dL (ref 31.5–36.0)
MONO#: 0.3 10*3/uL (ref 0.1–0.9)
NEUT%: 45.6 % (ref 38.4–76.8)
RDW: 13.2 % (ref 11.2–14.5)
WBC: 3.6 10*3/uL — ABNORMAL LOW (ref 3.9–10.3)
lymph#: 1.6 10*3/uL (ref 0.9–3.3)

## 2011-02-05 LAB — COMPREHENSIVE METABOLIC PANEL
ALT: 18 U/L (ref 0–35)
Alkaline Phosphatase: 76 U/L (ref 39–117)
Sodium: 143 mEq/L (ref 135–145)
Total Bilirubin: 0.6 mg/dL (ref 0.3–1.2)
Total Protein: 6.3 g/dL (ref 6.0–8.3)

## 2011-02-12 ENCOUNTER — Encounter (HOSPITAL_BASED_OUTPATIENT_CLINIC_OR_DEPARTMENT_OTHER): Payer: PRIVATE HEALTH INSURANCE | Admitting: Oncology

## 2011-02-12 DIAGNOSIS — C50419 Malignant neoplasm of upper-outer quadrant of unspecified female breast: Secondary | ICD-10-CM

## 2011-05-13 ENCOUNTER — Emergency Department (HOSPITAL_COMMUNITY): Payer: PRIVATE HEALTH INSURANCE

## 2011-05-13 ENCOUNTER — Emergency Department (HOSPITAL_COMMUNITY)
Admission: EM | Admit: 2011-05-13 | Discharge: 2011-05-13 | Discharge status: Against Medical Advice | Payer: PRIVATE HEALTH INSURANCE | Attending: Emergency Medicine | Admitting: Emergency Medicine

## 2011-05-13 DIAGNOSIS — M25476 Effusion, unspecified foot: Secondary | ICD-10-CM | POA: Insufficient documentation

## 2011-05-13 DIAGNOSIS — Z853 Personal history of malignant neoplasm of breast: Secondary | ICD-10-CM | POA: Insufficient documentation

## 2011-05-13 DIAGNOSIS — M25519 Pain in unspecified shoulder: Secondary | ICD-10-CM | POA: Insufficient documentation

## 2011-05-13 DIAGNOSIS — R002 Palpitations: Secondary | ICD-10-CM | POA: Insufficient documentation

## 2011-05-13 DIAGNOSIS — M25473 Effusion, unspecified ankle: Secondary | ICD-10-CM | POA: Insufficient documentation

## 2011-05-13 DIAGNOSIS — R000 Tachycardia, unspecified: Secondary | ICD-10-CM | POA: Insufficient documentation

## 2011-05-13 DIAGNOSIS — R51 Headache: Secondary | ICD-10-CM | POA: Insufficient documentation

## 2011-05-13 DIAGNOSIS — R0602 Shortness of breath: Secondary | ICD-10-CM | POA: Insufficient documentation

## 2011-05-13 DIAGNOSIS — R079 Chest pain, unspecified: Secondary | ICD-10-CM | POA: Insufficient documentation

## 2011-05-13 LAB — BASIC METABOLIC PANEL
Calcium: 9.7 mg/dL (ref 8.4–10.5)
GFR calc Af Amer: >60 mL/min (ref >60)
GFR calc non Af Amer: >60 mL/min (ref >60)
Sodium: 143 mEq/L (ref 135–145)

## 2011-05-13 LAB — D-DIMER, QUANTITATIVE: D-Dimer, Quant: <0.22 ug/mL-FEU (ref 0.00–0.48)

## 2011-05-13 LAB — CBC
HCT: 39 % (ref 36.0–46.0)
MCV: 85.3 fL (ref 78.0–100.0)
RBC: 4.57 MIL/uL (ref 3.87–5.11)
RDW: 12.9 % (ref 11.5–15.5)
WBC: 4.6 10*3/uL (ref 4.0–10.5)

## 2011-05-13 LAB — CK TOTAL AND CKMB (NOT AT ARMC)
CK, MB: 2.5 ng/mL (ref 0.3–4.0)
Total CK: 88 U/L (ref 7–177)

## 2011-05-13 LAB — DIFFERENTIAL
Eosinophils Relative: 1 % (ref 0–5)
Lymphocytes Relative: 50 % — ABNORMAL HIGH (ref 12–46)
Lymphs Abs: 2.3 10*3/uL (ref 0.7–4.0)

## 2011-06-30 LAB — CBC
HCT: 39.1
Hemoglobin: 13.1
MCHC: 33.6
RBC: 4.51
RDW: 14.2 — ABNORMAL HIGH

## 2011-08-17 ENCOUNTER — Telehealth: Payer: Self-pay | Admitting: *Deleted

## 2011-08-17 NOTE — Telephone Encounter (Signed)
gave patient appointment for six month check in 09-2011 lab and to see dr.rubin

## 2011-08-20 ENCOUNTER — Telehealth: Payer: Self-pay | Admitting: *Deleted

## 2011-08-20 NOTE — Telephone Encounter (Signed)
Pt left message on VM regarding swelling in the hand.Attempted to return pt call. A message was left for pt to call this desk 08/23/11. Pt follows up with MD annually. TX has been completed 05/11/2007

## 2011-09-14 DIAGNOSIS — I890 Lymphedema, not elsewhere classified: Secondary | ICD-10-CM

## 2011-09-14 HISTORY — DX: Lymphedema, not elsewhere classified: I89.0

## 2011-09-16 ENCOUNTER — Other Ambulatory Visit (HOSPITAL_BASED_OUTPATIENT_CLINIC_OR_DEPARTMENT_OTHER): Payer: PRIVATE HEALTH INSURANCE | Admitting: Lab

## 2011-09-16 ENCOUNTER — Other Ambulatory Visit: Payer: Self-pay | Admitting: Oncology

## 2011-09-16 DIAGNOSIS — C50419 Malignant neoplasm of upper-outer quadrant of unspecified female breast: Secondary | ICD-10-CM

## 2011-09-16 DIAGNOSIS — Z171 Estrogen receptor negative status [ER-]: Secondary | ICD-10-CM

## 2011-09-16 LAB — CBC WITH DIFFERENTIAL/PLATELET
Basophils Absolute: 0 10*3/uL (ref 0.0–0.1)
EOS%: 1.4 % (ref 0.0–7.0)
LYMPH%: 47.4 % (ref 14.0–49.7)
MCH: 30.6 pg (ref 25.1–34.0)
MCV: 87.5 fL (ref 79.5–101.0)
MONO%: 7 % (ref 0.0–14.0)
Platelets: 172 10*3/uL (ref 145–400)
RBC: 4.36 10*6/uL (ref 3.70–5.45)
RDW: 13.2 % (ref 11.2–14.5)

## 2011-09-16 LAB — COMPREHENSIVE METABOLIC PANEL
AST: 17 U/L (ref 0–37)
Albumin: 4.4 g/dL (ref 3.5–5.2)
Alkaline Phosphatase: 67 U/L (ref 39–117)
BUN: 22 mg/dL (ref 6–23)
Glucose, Bld: 106 mg/dL — ABNORMAL HIGH (ref 70–99)
Potassium: 4.6 mEq/L (ref 3.5–5.3)
Sodium: 143 mEq/L (ref 135–145)
Total Bilirubin: 0.7 mg/dL (ref 0.3–1.2)

## 2011-09-16 LAB — VITAMIN D 25 HYDROXY (VIT D DEFICIENCY, FRACTURES): Vit D, 25-Hydroxy: 36 ng/mL (ref 30–89)

## 2011-09-22 ENCOUNTER — Encounter (INDEPENDENT_AMBULATORY_CARE_PROVIDER_SITE_OTHER): Payer: Self-pay | Admitting: Surgery

## 2011-09-23 ENCOUNTER — Encounter: Payer: Self-pay | Admitting: Oncology

## 2011-09-28 ENCOUNTER — Telehealth: Payer: Self-pay | Admitting: Oncology

## 2011-09-28 ENCOUNTER — Ambulatory Visit (HOSPITAL_BASED_OUTPATIENT_CLINIC_OR_DEPARTMENT_OTHER): Payer: PRIVATE HEALTH INSURANCE | Admitting: Oncology

## 2011-09-28 DIAGNOSIS — C50919 Malignant neoplasm of unspecified site of unspecified female breast: Secondary | ICD-10-CM

## 2011-09-28 DIAGNOSIS — E559 Vitamin D deficiency, unspecified: Secondary | ICD-10-CM

## 2011-09-28 DIAGNOSIS — R599 Enlarged lymph nodes, unspecified: Secondary | ICD-10-CM

## 2011-09-28 NOTE — Telephone Encounter (Signed)
gve the pt her jan 2014 appt calendar °

## 2011-09-28 NOTE — Progress Notes (Signed)
Hematology and Oncology Follow Up Visit  Linda Foster [id number] 1955-10-16 56 y.o. 09/28/2011 10:43 AM PCP  Principle Diagnosis: locally advanced [place] breast cancer s/p dd FEC x4, followed by dd taxotere x 4; completed 2008, with pCr, s/p xrt completed 05/11/07  Interim History:  There have been no intercurrent illness, hospitalizations or medication changes., she has noticed some lymphedema of the lt arm. She had a mammogram and BSGI recently with 6 mo f/u recommended for the lt breast.  Medications: I have reviewed the patient's current medications.  Allergies: No Known Allergies  Past Medical History, Surgical history, Social history, and Family History were reviewed and updated.  Review of Systems: Constitutional:  Negative for fever, chills, night sweats, anorexia, weight loss, pain. Cardiovascular: no chest pain or dyspnea on exertion Respiratory: no cough, shortness of breath, or wheezing Neurological: negative Dermatological: negative ENT: negative Skin Gastrointestinal: no abdominal pain, change in bowel habits, or black or bloody stools Genito-Urinary: no dysuria, trouble voiding, or hematuria Hematological and Lymphatic: negative Breast: negative Musculoskeletal: negative Remaining ROS negative.  Physical Exam: Blood pressure 118/75, pulse 66, temperature 98.3 F (36.8 C), height 5\' 6"  (1.676 m), weight 191 lb 1.6 oz (86.682 kg). ECOG: 0 General appearance: alert, cooperative and appears stated age Head: Normocephalic, without obvious abnormality, atraumatic Neck: no adenopathy, no carotid bruit, no JVD, supple, symmetrical, trachea midline and thyroid not enlarged, symmetric, no tenderness/mass/nodules Lymph nodes: Cervical, supraclavicular, and axillary nodes normal. Cardiac : regular rate and rhythm, no murmurs or gallops Pulmonary:clear to auscultation bilaterally and normal percussion bilaterally Breasts: inspection negative, no nipple discharge or bleeding, no  masses or nodularity palpable Abdomen:soft, non-tender; bowel sounds normal; no masses,  no organomegaly Extremities negative apart from lymphedema lt arm Neuro: alert, oriented, normal speech, no focal findings or movement disorder noted  Lab Results: Lab Results  Component Value Date   WBC 3.3* 09/16/2011   HGB 13.4 09/16/2011   HCT 38.2 09/16/2011   MCV 87.5 09/16/2011   PLT 172 09/16/2011     Chemistry      Component Value Date/Time   NA 143 09/16/2011 0854   NA 143 09/16/2011 0854   NA 143 09/16/2011 0854   K 4.6 09/16/2011 0854   K 4.6 09/16/2011 0854   K 4.6 09/16/2011 0854   CL 107 09/16/2011 0854   CL 107 09/16/2011 0854   CL 107 09/16/2011 0854   CO2 27 09/16/2011 0854   CO2 27 09/16/2011 0854   CO2 27 09/16/2011 0854   BUN 22 09/16/2011 0854   BUN 22 09/16/2011 0854   BUN 22 09/16/2011 0854   CREATININE 0.65 09/16/2011 0854   CREATININE 0.65 09/16/2011 0854   CREATININE 0.65 09/16/2011 0854      Component Value Date/Time   CALCIUM 9.3 09/16/2011 0854   CALCIUM 9.3 09/16/2011 0854   CALCIUM 9.3 09/16/2011 0854   ALKPHOS 67 09/16/2011 0854   ALKPHOS 67 09/16/2011 0854   ALKPHOS 67 09/16/2011 0854   AST 17 09/16/2011 0854   AST 17 09/16/2011 0854   AST 17 09/16/2011 0854   ALT 14 09/16/2011 0854   ALT 14 09/16/2011 0854   ALT 14 09/16/2011 0854   BILITOT 0.7 09/16/2011 0854   BILITOT 0.7 09/16/2011 0854   BILITOT 0.7 09/16/2011 0854      .pathology. Radiological Studies: chest X-ray n/a Mammogram recent Bone density n/a  Impression and Plan: Ms pfeifer is doing well apart from lymphedema of the lt arm. I havereferred her to  lymphedema clinic for further evaluation. i will see her in 1 yr. Her labs are wnl, I have recommended increasing her vitamin d.  More than 50% of the visit was spent in patient-related counselling   Pierce Crane, MD 1/15/201310:43 AM

## 2011-10-01 ENCOUNTER — Telehealth: Payer: Self-pay

## 2011-10-01 NOTE — Telephone Encounter (Signed)
Received message from pt requesting prescription be sent to [facility].  Faxed prescription for pt "to be fitted for lymphedema sleeve and glove 30 - 40 mmHg compression" to [phone].  Called pt and left message on home phone vm that this was done.

## 2011-10-25 ENCOUNTER — Telehealth: Payer: Self-pay | Admitting: Oncology

## 2011-10-25 NOTE — Telephone Encounter (Signed)
S/w April from the lymphedema clinic and she stated that the pt declined the treatment and decided that she just needed to go to the store to be fitted for a compression sleeve

## 2011-11-18 ENCOUNTER — Telehealth: Payer: Self-pay

## 2011-11-18 NOTE — Telephone Encounter (Signed)
Received call from pt stating she received letter from [employer] stating they need information from Dr Donnie Coffin before they will cover her compression sleeve.   Received fax copy of the letter which states, "please have the attending physician submit a letter explaining the medical necessity of this service."  Copy of letter to Dr Renelda Loma inbox to complete letter of medical necessity. dph

## 2011-12-07 ENCOUNTER — Other Ambulatory Visit: Payer: Self-pay | Admitting: Physician Assistant

## 2011-12-07 ENCOUNTER — Telehealth: Payer: Self-pay | Admitting: *Deleted

## 2011-12-07 NOTE — Telephone Encounter (Signed)
Pt. Called to say that insurance company has not received the letter of "medical necessity"  For arm sleeve and the are still denying payment.  She needs letter faxed to [employer].  Dr. Donnie Coffin is out of the office, so am not sure if this has been done or not. Pt. Will refax this to [place] tomorrow and will give to Dr. Donnie Coffin on Thursday 3/28

## 2012-08-11 ENCOUNTER — Telehealth: Payer: Self-pay | Admitting: *Deleted

## 2012-08-11 NOTE — Telephone Encounter (Signed)
per md going on vac moved patient appointment to 10-03-2012 starting at 8:30am with lab mailed out calendar to inform the patien

## 2012-09-28 ENCOUNTER — Ambulatory Visit: Payer: PRIVATE HEALTH INSURANCE | Admitting: Oncology

## 2012-09-28 ENCOUNTER — Other Ambulatory Visit: Payer: PRIVATE HEALTH INSURANCE | Admitting: Lab

## 2012-10-02 ENCOUNTER — Telehealth: Payer: Self-pay | Admitting: Oncology

## 2012-10-02 NOTE — Telephone Encounter (Signed)
Pt called, she received her letter.  She would like to see Dr. Darnelle Catalan for follow up.   She is anxious to be seen, this is the first time her appt has been delayed.

## 2012-10-03 ENCOUNTER — Ambulatory Visit: Payer: PRIVATE HEALTH INSURANCE | Admitting: Oncology

## 2012-10-03 ENCOUNTER — Other Ambulatory Visit: Payer: PRIVATE HEALTH INSURANCE | Admitting: Lab

## 2012-10-21 ENCOUNTER — Encounter: Payer: Self-pay | Admitting: *Deleted

## 2012-10-21 ENCOUNTER — Telehealth: Payer: Self-pay | Admitting: *Deleted

## 2012-10-21 NOTE — Telephone Encounter (Signed)
Per patient reassignment I have contacted the patient to schedule appt. Per last telephone note from Tammi, patient request Dr. Darnelle Catalan. I have made the appt. Letter mailed. JMW

## 2012-11-06 ENCOUNTER — Encounter: Payer: Self-pay | Admitting: Family

## 2012-11-06 ENCOUNTER — Ambulatory Visit (HOSPITAL_BASED_OUTPATIENT_CLINIC_OR_DEPARTMENT_OTHER): Payer: BC Managed Care – PPO | Admitting: Family

## 2012-11-06 VITALS — BP 126/89 | HR 81 | Temp 98.8°F | Resp 20 | Ht 66.0 in | Wt 202.3 lb

## 2012-11-06 DIAGNOSIS — Z853 Personal history of malignant neoplasm of breast: Secondary | ICD-10-CM

## 2012-11-06 DIAGNOSIS — C50912 Malignant neoplasm of unspecified site of left female breast: Secondary | ICD-10-CM

## 2012-11-06 NOTE — Patient Instructions (Addendum)
Please contact us at [phone] if you have any questions or concerns.  Clinical breast exam annually  Mammogram annually  Report palpitations to primary care physician

## 2012-11-06 NOTE — Progress Notes (Signed)
ID: Linda Foster   DOB: [birth year]  MR#: [id number]  WUJ#:[id number]  PCP: Linda Found, MD   HISTORY OF PRESENT ILLNESS: From Dr. Theron Arista Foster's New Patient Evaluation dated 08/30/2006: "This is a delightful 57 year old woman from [facility] referred by Dr. Maple Foster for evaluation and treatment of breast cancer.  This woman has been in good health all of her life.  She really has no chronic medical problems.  She noted a left breast mass the early part of December.  Prior to that she has undergone routine screening mammography on an annual basis.  She did see Dr. Yolanda Foster as she had worked for previously.  At the time of that exam she had a firm mass at the 1-2:30 position, left axilla had palpable fullness.  Mammogram performed on the same day showed a palpable mass 3 cm bi-lobe with a centimeter focus of calcifications.  Ultrasound confirmed the presence of a hypoechoic mass measuring about 5 cm mass in maximum diameter.  There was a 2 cm hypoechoic lymph node seen.  Biopsies were performed on the same day.  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  ER/PR negative.  HER-2/neu was 1+.  FISH testing is pending.  Proliferative index was elevated at 52%.  A subsequent MRI scan was done of both breasts, which essentially showed a 4.9 cm mass in the upper outer quadrant of the left breast, enlarged lymph nodes on the left.  The right breast was normal."     INTERVAL HISTORY: Dr. Darnelle Foster and I saw Linda Foster today for follow up of her breast cancer. Since her last office visit with Dr. Donnie Foster on 09/27/2001, the patient states that she is started on a new weight loss program involving the use of Phentermine which occasionally causes her palpitations.  She states if she decreases the dose by half, she does not experience palpitations. The patient was asked to report the symptom to the prescribing provider. She is establishing herself with Dr. Darrall Foster service today.   REVIEW OF  SYSTEMS: A 10 point review of systems was completed and is negative except for occasional knee/joint pains and palpitations precipitated by the patient taking phentermine. The patient denies any other symptomatology.  PAST MEDICAL HISTORY: Past Medical History  Diagnosis Date  . Breast cancer   . Osteoarthritis   . Lymphedema 2013    PAST SURGICAL HISTORY: Past Surgical History  Procedure Laterality Date  . Breast lumpectomy Left 2008  . Abdominal hysterectomy    . Knee arthroscopy Right 2007  . Cesarean section       x 3    FAMILY HISTORY Family History  Problem Relation Age of Onset  . Hypertension Mother   . COPD Father   . Cancer Father     Prostate CA  . Cancer Brother     Prostate    GYNECOLOGIC HISTORY: Gravida 3, para 3, menarche age 63,. Age 89, hysterectomy in 10/2005 secondary to menorrhagia.  SOCIAL HISTORY: The patient has been married for over 30 years to her husband, Linda Foster who operates a Designer, jewellery. They have 3 adult children - 2 girls and one boy. She also has 3 grandsons. She is a retired Dentist. The patient reports being active in her church.   ADVANCED DIRECTIVES: Not on file  HEALTH MAINTENANCE: History  Substance Use Topics  . Smoking status: Never Smoker   . Smokeless tobacco: Never Used  . Alcohol Use: No     Colonoscopy:   The  patient reports she had a colonoscopy 2 years ago  PAP: Not on file  Bone density:  The patient had a bone density scan 09/24/2010 that was normal. She is due for repeat bone density scan.  Lipid panel: Not on file  No Known Allergies  Current Outpatient Prescriptions  Medication Sig Dispense Refill  . co-enzyme Q-10 30 MG capsule Take 30 mg by mouth daily.      . cyanocobalamin 1000 MCG tablet Take 100 mcg by mouth daily.      . furosemide (LASIX) 40 MG tablet Take 40 mg by mouth daily.      Marland Kitchen glucosamine-chondroitin 500-400 MG tablet Take 1 tablet by mouth daily. Pt. Not sure of  dose      . naproxen (NAPROSYN) 500 MG tablet Take 500 mg by mouth daily. Pt. Not sure of dose      . phentermine 37.5 MG capsule Take 37.5 mg by mouth every morning.      . Vitamin D, Ergocalciferol, (DRISDOL) 50000 UNITS CAPS Take 1,000 Units by mouth daily. Pt. Not sure of dose       No current facility-administered medications for this visit.    OBJECTIVE: Filed Vitals:   11/06/12 1527  BP: 126/89  Pulse: 81  Temp: 98.8 F (37.1 C)  Resp: 20     Body mass index is 32.67 kg/(m^2).      ECOG FS:  Grade 0 - Fully active  General appearance: Alert, cooperative, well nourished, no apparent distress Head: Normocephalic, without obvious abnormality, atraumatic Eyes: Conjunctivae/corneas clear, PERRLA, EOMI Nose: Nares, septum and mucosa are normal, no drainage or sinus tenderness Neck: No adenopathy, supple, symmetrical, trachea midline, thyroid not enlarged, no tenderness Resp: Clear to auscultation bilaterally Cardio: Regular rate and rhythm, S1, S2 normal, no murmur, click, rub or gallop Breasts: Soft and pendulous bilaterally, firm left inframammary area, radiation changes noted, well-healed surgical scar on left breast, no lymphadenopathy, slight nipple inversion on the right breast, no axilla fullness, benign breast exam GI: Soft, distended, non-tender, hypoactive bowel sounds, no organomegaly Extremities: Extremities normal, atraumatic, no cyanosis, 2+ bilateral lower chimney edema, left upper extremity lymphedema Lymph nodes: Cervical, supraclavicular, and axillary nodes normal Neurologic: Grossly normal   LAB RESULTS: Lab Results  Component Value Date   WBC 3.3* 09/16/2011   NEUTROABS 1.4* 09/16/2011   HGB 13.4 09/16/2011   HCT 38.2 09/16/2011   MCV 87.5 09/16/2011   PLT 172 09/16/2011      Chemistry      Component Value Date/Time   NA 143 09/16/2011 0854   K 4.6 09/16/2011 0854   CL 107 09/16/2011 0854   CO2 27 09/16/2011 0854   BUN 22 09/16/2011 0854   CREATININE 0.65 09/16/2011  0854      Component Value Date/Time   CALCIUM 9.3 09/16/2011 0854   ALKPHOS 67 09/16/2011 0854   AST 17 09/16/2011 0854   ALT 14 09/16/2011 0854   BILITOT 0.7 09/16/2011 0854       Lab Results  Component Value Date   LABCA2 30 09/16/2011   Urinalysis    Component Value Date/Time   LABSPEC 1.020 10/30/2008 1837   PHURINE 7.5 10/30/2008 1837   GLUCOSEU NEGATIVE 10/30/2008 1837   HGBUR LARGE* 10/30/2008 1837   BILIRUBINUR NEGATIVE 10/30/2008 1837   KETONESUR NEGATIVE 10/30/2008 1837   PROTEINUR 100* 10/30/2008 1837   UROBILINOGEN 0.2 10/30/2008 1837   NITRITE NEGATIVE 10/30/2008 1837   LEUKOCYTESUR MODERATE Biochemical Testing Only. Please order routine urinalysis  from main lab if confirmatory testing is needed.* 10/30/2008 1837    STUDIES: No results Foster.  ASSESSMENT: 57 y.o. [place] woman:   1. Status post routine screening mammogram that showed a palpable mass 3 cm by lobe with this centimeter focus of calcifications. Ultrasound confirmed the presence of a hypoechoic mass measuring about 5 cm in maximum diameter. There was a 2 cm hypoechoic lymph nodes seen. Biopsies were performed on the same day. 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 ER/PR positive, Ki-67 52%, HER-2/neu 1+.  A subsequent MRI on 08/22/2006 was done on both breasts and showed a 4.9 cm mass in the upper outer quadrant of the left breast with enlarged lymph nodes on the left.  The right breast is normal. 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  2. Status post 4 cycles of chemotherapy with FEC from 09/16/2006 through 11/04/2006..  3. Status post 2 cycles of chemotherapy with Taxotere from 11/18/2006 through 12/02/2006.  4. Status post 2 cycles of chemotherapy with Abraxane from 12/16/2006 through 01/06/2007  5.  Status post needle localization biopsy with left axillary excision on 02/01/2007.  6. Radiation therapy from 03/28/2007 through his 04/21/2007.   PLAN: Dr. Darnelle Foster  discussed the patient's entire clinical course with her extensively and gave her rationale why he believes the patient is ready to graduate from the [facility] breast cancer program. The patient agreed. The patient was encouraged to continue to have annual mammograms, and annual clinical breast examinations. Follow-up correspondence will be also be sent to Dr. Katrinka Blazing.   All questions were answered.  The patient was encouraged to contact us with any problems, questions or concerns.   Larina Bras, NP-C 11/08/2012, 6:24 PM

## 2014-06-07 ENCOUNTER — Other Ambulatory Visit: Payer: Self-pay | Admitting: Cardiology

## 2014-06-07 ENCOUNTER — Ambulatory Visit
Admission: RE | Admit: 2014-06-07 | Discharge: 2014-06-07 | Disposition: A | Discharge status: Home / Self Care | Payer: BC Managed Care – PPO | Source: Ambulatory Visit | Attending: Cardiology | Admitting: Cardiology

## 2014-06-07 DIAGNOSIS — I872 Venous insufficiency (chronic) (peripheral): Secondary | ICD-10-CM

## 2014-06-13 ENCOUNTER — Ambulatory Visit (HOSPITAL_COMMUNITY)
Admission: RE | Admit: 2014-06-13 | Discharge: 2014-06-13 | Disposition: A | Discharge status: Home / Self Care | Payer: BC Managed Care – PPO | Source: Ambulatory Visit | Attending: Vascular Surgery | Admitting: Vascular Surgery

## 2014-06-13 ENCOUNTER — Other Ambulatory Visit: Payer: Self-pay | Admitting: Surgery

## 2014-06-13 ENCOUNTER — Other Ambulatory Visit (HOSPITAL_COMMUNITY): Payer: Self-pay | Admitting: Cardiology

## 2014-06-13 DIAGNOSIS — I872 Venous insufficiency (chronic) (peripheral): Secondary | ICD-10-CM | POA: Diagnosis not present

## 2015-01-06 ENCOUNTER — Encounter: Payer: Self-pay | Admitting: Oncology

## 2015-03-14 ENCOUNTER — Ambulatory Visit (INDEPENDENT_AMBULATORY_CARE_PROVIDER_SITE_OTHER): Payer: BLUE CROSS/BLUE SHIELD | Admitting: Podiatry

## 2015-03-14 ENCOUNTER — Encounter: Payer: Self-pay | Admitting: Podiatry

## 2015-03-14 VITALS — BP 126/78 | HR 68 | Resp 15 | Ht 66.0 in | Wt 200.0 lb

## 2015-03-14 DIAGNOSIS — B078 Other viral warts: Secondary | ICD-10-CM

## 2015-03-14 DIAGNOSIS — B079 Viral wart, unspecified: Secondary | ICD-10-CM | POA: Diagnosis not present

## 2015-03-14 NOTE — Progress Notes (Signed)
   Subjective:    Patient ID: Linda Foster, female    DOB: [birth year], 59 y.o.   MRN: [id number]  HPI Patient presents with wart on their Right foot. Pt stated it is very painful when they stand, walk, or when they move their foot around. Pt stated they put duct tape on their foot for 4 days. This did not help their foot feel better. This has been going on for the past month.   Review of Systems  Musculoskeletal: Positive for joint swelling and arthralgias.  All other systems reviewed and are negative.      Objective:   Physical Exam        Assessment & Plan:

## 2015-03-16 NOTE — Progress Notes (Signed)
Subjective:     Patient ID: Linda Foster, female   DOB: 1956-08-29, 59 y.o.   MRN: [id number]  HPI patient presents with lesion plantar aspect right foot that has been painful for the last month and she does not remember it being there prior to the   Review of Systems  All other systems reviewed and are negative.      Objective:   Physical Exam  Constitutional: She is oriented to person, place, and time.  Cardiovascular: Intact distal pulses.   Musculoskeletal: Normal range of motion.  Neurological: She is oriented to person, place, and time.  Skin: Skin is warm.  Nursing note and vitals reviewed.  neurovascular status intact muscle strength adequate range of motion within normal limits and patient noted to have a plantar lesion right foot measuring around 5 x 5 mm that upon debridement shows pinpoint bleeding and is painful to lateral pressure. No other lesions upon inspection of both feet and patient is found to have good digital perfusion and is well oriented 3     Assessment:     Verruca plantaris plantar aspect right foot    Plan:     H&P performed and debride lesion and at this time apply chemical consisting of immune agent to create response with sterile dressing. Patient will be seen again in 1 month and was given instructions for what to do if any blistering or other adverse reaction should occur

## 2015-04-25 ENCOUNTER — Encounter: Payer: Self-pay | Admitting: Podiatry

## 2015-04-25 ENCOUNTER — Ambulatory Visit (INDEPENDENT_AMBULATORY_CARE_PROVIDER_SITE_OTHER): Payer: BLUE CROSS/BLUE SHIELD | Admitting: Podiatry

## 2015-04-25 VITALS — BP 128/73 | HR 72 | Resp 16

## 2015-04-25 DIAGNOSIS — B079 Viral wart, unspecified: Secondary | ICD-10-CM

## 2015-04-25 DIAGNOSIS — B070 Plantar wart: Secondary | ICD-10-CM

## 2015-04-25 DIAGNOSIS — B078 Other viral warts: Secondary | ICD-10-CM

## 2015-04-27 NOTE — Progress Notes (Signed)
Subjective:     Patient ID: Linda Foster, female   DOB: 10-26-1955, 59 y.o.   MRN: [id number]  HPI patient presents stating I think it's almost gone   Review of Systems     Objective:   Physical Exam Neurovascular status intact with multiple lesions plantar aspect of the right foot that upon debridement still show small pinpoint bleeding with pain to lateral pressure but much more superficial than previous visit    Assessment:     Verruca plantaris that is improving of the right foot    Plan:     Debride lesions and applied chemical to create destruction and sterile dressings. Gave instructions on soaks and reappoint

## 2015-12-16 DIAGNOSIS — R600 Localized edema: Secondary | ICD-10-CM | POA: Diagnosis not present

## 2015-12-16 DIAGNOSIS — E785 Hyperlipidemia, unspecified: Secondary | ICD-10-CM | POA: Diagnosis not present

## 2016-01-02 DIAGNOSIS — M1711 Unilateral primary osteoarthritis, right knee: Secondary | ICD-10-CM | POA: Diagnosis not present

## 2016-01-02 DIAGNOSIS — M25561 Pain in right knee: Secondary | ICD-10-CM | POA: Diagnosis not present

## 2016-01-02 DIAGNOSIS — M25461 Effusion, right knee: Secondary | ICD-10-CM | POA: Diagnosis not present

## 2016-01-08 DIAGNOSIS — R7309 Other abnormal glucose: Secondary | ICD-10-CM | POA: Diagnosis not present

## 2016-06-08 DIAGNOSIS — J0100 Acute maxillary sinusitis, unspecified: Secondary | ICD-10-CM | POA: Diagnosis not present

## 2016-07-05 DIAGNOSIS — Z23 Encounter for immunization: Secondary | ICD-10-CM | POA: Diagnosis not present

## 2016-09-27 ENCOUNTER — Other Ambulatory Visit (INDEPENDENT_AMBULATORY_CARE_PROVIDER_SITE_OTHER): Payer: Self-pay | Admitting: Specialist

## 2016-09-27 DIAGNOSIS — C50919 Malignant neoplasm of unspecified site of unspecified female breast: Secondary | ICD-10-CM

## 2016-09-27 DIAGNOSIS — Z1231 Encounter for screening mammogram for malignant neoplasm of breast: Secondary | ICD-10-CM | POA: Diagnosis not present

## 2016-09-27 DIAGNOSIS — E559 Vitamin D deficiency, unspecified: Secondary | ICD-10-CM

## 2016-09-27 DIAGNOSIS — Z853 Personal history of malignant neoplasm of breast: Secondary | ICD-10-CM | POA: Diagnosis not present

## 2016-10-13 ENCOUNTER — Ambulatory Visit (INDEPENDENT_AMBULATORY_CARE_PROVIDER_SITE_OTHER): Payer: BLUE CROSS/BLUE SHIELD | Admitting: Orthopedic Surgery

## 2016-11-02 ENCOUNTER — Encounter (HOSPITAL_COMMUNITY): Payer: Self-pay

## 2016-11-02 ENCOUNTER — Emergency Department (HOSPITAL_COMMUNITY): Payer: BLUE CROSS/BLUE SHIELD

## 2016-11-02 ENCOUNTER — Observation Stay (HOSPITAL_COMMUNITY)
Admission: EM | Admit: 2016-11-02 | Discharge: 2016-11-03 | Disposition: A | Discharge status: Home / Self Care | Payer: BLUE CROSS/BLUE SHIELD | Attending: Cardiology | Admitting: Cardiology

## 2016-11-02 DIAGNOSIS — R55 Syncope and collapse: Secondary | ICD-10-CM | POA: Insufficient documentation

## 2016-11-02 DIAGNOSIS — Z853 Personal history of malignant neoplasm of breast: Secondary | ICD-10-CM | POA: Insufficient documentation

## 2016-11-02 DIAGNOSIS — E876 Hypokalemia: Secondary | ICD-10-CM | POA: Insufficient documentation

## 2016-11-02 DIAGNOSIS — M1990 Unspecified osteoarthritis, unspecified site: Secondary | ICD-10-CM | POA: Diagnosis not present

## 2016-11-02 DIAGNOSIS — I890 Lymphedema, not elsewhere classified: Secondary | ICD-10-CM | POA: Diagnosis not present

## 2016-11-02 DIAGNOSIS — Z7982 Long term (current) use of aspirin: Secondary | ICD-10-CM | POA: Diagnosis not present

## 2016-11-02 DIAGNOSIS — R0602 Shortness of breath: Secondary | ICD-10-CM | POA: Diagnosis not present

## 2016-11-02 DIAGNOSIS — R000 Tachycardia, unspecified: Secondary | ICD-10-CM | POA: Diagnosis not present

## 2016-11-02 DIAGNOSIS — Z8249 Family history of ischemic heart disease and other diseases of the circulatory system: Secondary | ICD-10-CM | POA: Insufficient documentation

## 2016-11-02 DIAGNOSIS — R51 Headache: Secondary | ICD-10-CM | POA: Diagnosis not present

## 2016-11-02 DIAGNOSIS — I4891 Unspecified atrial fibrillation: Secondary | ICD-10-CM | POA: Diagnosis not present

## 2016-11-02 DIAGNOSIS — I10 Essential (primary) hypertension: Secondary | ICD-10-CM | POA: Diagnosis present

## 2016-11-02 DIAGNOSIS — R0789 Other chest pain: Secondary | ICD-10-CM | POA: Diagnosis not present

## 2016-11-02 DIAGNOSIS — R079 Chest pain, unspecified: Secondary | ICD-10-CM | POA: Diagnosis present

## 2016-11-02 LAB — MAGNESIUM: Magnesium: 2.2 mg/dL (ref 1.7–2.4)

## 2016-11-02 LAB — BASIC METABOLIC PANEL
Anion gap: 14 (ref 5–15)
BUN: 23 mg/dL — AB (ref 6–20)
CHLORIDE: 105 mmol/L (ref 101–111)
CO2: 23 mmol/L (ref 22–32)
CREATININE: 0.86 mg/dL (ref 0.44–1.00)
Calcium: 10 mg/dL (ref 8.9–10.3)
GFR calc Af Amer: >60 mL/min (ref >60)
GFR calc non Af Amer: >60 mL/min (ref >60)
GLUCOSE: 108 mg/dL — AB (ref 65–99)
POTASSIUM: 3.5 mmol/L (ref 3.5–5.1)
SODIUM: 142 mmol/L (ref 135–145)

## 2016-11-02 LAB — CBC
HEMATOCRIT: 41.4 % (ref 36.0–46.0)
Hemoglobin: 14.1 g/dL (ref 12.0–15.0)
MCH: 29.1 pg (ref 26.0–34.0)
MCHC: 34.1 g/dL (ref 30.0–36.0)
MCV: 85.5 fL (ref 78.0–100.0)
Platelets: 213 10*3/uL (ref 150–400)
RBC: 4.84 MIL/uL (ref 3.87–5.11)
RDW: 13.7 % (ref 11.5–15.5)
WBC: 8.5 10*3/uL (ref 4.0–10.5)

## 2016-11-02 LAB — TSH: TSH: 2.763 u[IU]/mL (ref 0.350–4.500)

## 2016-11-02 LAB — I-STAT TROPONIN, ED: Troponin i, poc: 0 ng/mL (ref 0.00–0.08)

## 2016-11-02 MED ORDER — ONDANSETRON HCL 4 MG/2ML IJ SOLN
INTRAMUSCULAR | Status: AC | PRN
  Administered 2016-11-02: 4 mg via INTRAVENOUS

## 2016-11-02 MED ORDER — SODIUM CHLORIDE 0.9 % IV BOLUS (SEPSIS)
500.0000 mL | Freq: Once | INTRAVENOUS | Status: AC
  Administered 2016-11-02: 500 mL via INTRAVENOUS

## 2016-11-02 MED ORDER — ATORVASTATIN CALCIUM 80 MG PO TABS: 80.0000 mg | ORAL_TABLET | Freq: Every day | ORAL | Status: DC

## 2016-11-02 MED ORDER — ETOMIDATE 2 MG/ML IV SOLN
INTRAVENOUS | Status: AC
  Filled 2016-11-02: qty 10

## 2016-11-02 MED ORDER — METOPROLOL TARTRATE 50 MG PO TABS
50.0000 mg | ORAL_TABLET | Freq: Two times a day (BID) | ORAL | Status: DC
  Administered 2016-11-03: 50 mg via ORAL
  Filled 2016-11-02: qty 1

## 2016-11-02 MED ORDER — DILTIAZEM LOAD VIA INFUSION
10.0000 mg | Freq: Once | INTRAVENOUS | Status: AC
Start: 2016-11-02 — End: 2016-11-02
  Administered 2016-11-02: 10 mg via INTRAVENOUS
  Filled 2016-11-02: qty 10

## 2016-11-02 MED ORDER — ETOMIDATE 2 MG/ML IV SOLN
10.0000 mg | Freq: Once | INTRAVENOUS | Status: AC
  Administered 2016-11-02: 10 mg via INTRAVENOUS
  Filled 2016-11-02: qty 10

## 2016-11-02 MED ORDER — FUROSEMIDE 40 MG PO TABS
40.0000 mg | ORAL_TABLET | Freq: Every day | ORAL | Status: DC
  Administered 2016-11-03: 40 mg via ORAL
  Filled 2016-11-02: qty 1

## 2016-11-02 MED ORDER — ONDANSETRON HCL 4 MG/2ML IJ SOLN
4.0000 mg | Freq: Once | INTRAMUSCULAR | Status: AC
  Administered 2016-11-02: 4 mg via INTRAVENOUS

## 2016-11-02 MED ORDER — SODIUM CHLORIDE 0.9% FLUSH
3.0000 mL | Freq: Two times a day (BID) | INTRAVENOUS | Status: DC
  Administered 2016-11-03: 3 mL via INTRAVENOUS

## 2016-11-02 MED ORDER — ASPIRIN EC 81 MG PO TBEC
81.0000 mg | DELAYED_RELEASE_TABLET | Freq: Every day | ORAL | Status: DC
  Administered 2016-11-03: 81 mg via ORAL
  Filled 2016-11-02: qty 1

## 2016-11-02 MED ORDER — DEXTROSE 5 % IV SOLN
5.0000 mg/h | INTRAVENOUS | Status: DC
  Administered 2016-11-02: 5 mg/h via INTRAVENOUS
  Filled 2016-11-02 (×2): qty 100

## 2016-11-02 MED ORDER — ONDANSETRON HCL 4 MG/2ML IJ SOLN
4.0000 mg | Freq: Once | INTRAMUSCULAR | Status: AC
  Administered 2016-11-02: 4 mg via INTRAVENOUS
  Filled 2016-11-02: qty 2

## 2016-11-02 NOTE — ED Notes (Signed)
ED Provider at bedside. 

## 2016-11-02 NOTE — Sedation Documentation (Signed)
ED MD and RT at bedside prior to starting.

## 2016-11-02 NOTE — ED Provider Notes (Signed)
[facility] DEPT Provider Note   CSN: [id number] Arrival date & time: 11/02/16  [id number]     History   Chief Complaint Chief Complaint  Patient presents with  . Chest Pain    HPI Linda Foster is a 61 y.o. female.  HPI Patient presents with some dull chest pain and feeling her heart race. Began this evening. Has some shortness of breath and dull headache. States she's felt a little dizzy today. Found to be in new onset A. fib with RVR. No swelling or legs. No fevers. No cough.     Past Medical History:  Diagnosis Date  . Breast cancer ([place])   . Lymphedema 2013  . Osteoarthritis     Patient Active Problem List   Diagnosis Date Noted  . Atrial fibrillation with RVR ([place]) 11/02/2016  . Hypertension, essential 11/02/2016  . Breast cancer ([place]) 11/06/2012    Past Surgical History:  Procedure Laterality Date  . ABDOMINAL HYSTERECTOMY    . BREAST LUMPECTOMY Left 2008  . CESAREAN SECTION      x 3  . KNEE ARTHROSCOPY Right 2007    OB History    No data available       Home Medications    Prior to Admission medications   Medication Sig Start Date End Date Taking? Authorizing Provider  B Complex Vitamins (VITAMIN-B COMPLEX PO) Take 2 tablets by mouth daily.   Yes Historical Provider, MD  Cholecalciferol (VITAMIN D3) 2000 units TABS Take 2,000 Units by mouth daily.   Yes Historical Provider, MD  furosemide (LASIX) 40 MG tablet Take 40 mg by mouth daily.   Yes Historical Provider, MD  glucosamine-chondroitin 500-400 MG tablet Take 2 tablets by mouth daily.    Yes Historical Provider, MD  Multiple Vitamins-Minerals (MULTIVITAMIN ADULT PO) Take 1 tablet by mouth daily.   Yes Historical Provider, MD  naproxen sodium (ANAPROX) 550 MG tablet Take 550 mg by mouth daily.  10/11/16  Yes Historical Provider, MD  Red Yeast Rice Extract (RED YEAST RICE PO) Take 2 capsules by mouth daily.    Yes Historical Provider, MD    Family History Family History  Problem Relation Age of  Onset  . Hypertension Mother   . COPD Father   . Cancer Father     Prostate CA  . Cancer Brother     Prostate    Social History Social History  Substance Use Topics  . Smoking status: Never Smoker  . Smokeless tobacco: Never Used  . Alcohol use No     Allergies   Patient has no known allergies.   Review of Systems Review of Systems   Physical Exam Updated Vital Signs BP 114/71   Pulse 104   Temp 98.6 F (37 C) (Oral)   Resp 25   Ht 5\' 7"  (1.702 m)   Wt 212 lb 6.4 oz (96.3 kg)   LMP  (Approximate)   SpO2 94%   BMI 33.27 kg/m   Physical Exam   ED Treatments / Results  Labs (all labs ordered are listed, but only abnormal results are displayed) Labs Reviewed  BASIC METABOLIC PANEL - Abnormal; Notable for the following:       Result Value   Glucose, Bld 108 (*)    BUN 23 (*)    All other components within normal limits  CBC  TSH  MAGNESIUM  HIV ANTIBODY (ROUTINE TESTING)  BRAIN NATRIURETIC PEPTIDE  TROPONIN I  TROPONIN I  TROPONIN I  HEMOGLOBIN A1C  COMPREHENSIVE  METABOLIC PANEL  CBC  PROTIME-INR  I-STAT TROPOININ, ED    EKG  EKG Interpretation  Date/Time:  Tuesday November 02 2016 19:22:49 EST Ventricular Rate:  179 PR Interval:    QRS Duration: 78 QT Interval:  256 QTC Calculation: 442 R Axis:   66 Text Interpretation:  Atrial fibrillation with rapid ventricular response Abnormal ECG Confirmed by Alvino Chapel  MD, Ovid Curd [phone]) on 11/02/2016 7:38:19 PM       Radiology Dg Chest Portable 1 View  Result Date: 11/02/2016 CLINICAL DATA:  61 y/o  F; atrial fibrillation and tachycardia. EXAM: PORTABLE CHEST 1 VIEW COMPARISON:  06/07/2014 chest radiograph FINDINGS: Stable heart size and mediastinal contours are within normal limits. Both lungs are clear. The visualized skeletal structures are unremarkable. Left axillary surgical clips. IMPRESSION: No active disease. Electronically Signed   By: Kristine Garbe M.D.   On: 11/02/2016 20:07      Procedures .Cardioversion Date/Time: 11/02/2016 8:00 PM Performed by: Davonna Belling Authorized by: Davonna Belling   Consent:    Consent obtained:  Verbal   Consent given by:  Patient   Risks discussed:  Cutaneous burn and death   Alternatives discussed:  No treatment and alternative treatment Pre-procedure details:    Rhythm:  Atrial fibrillation   Electrode placement:  Anterior-posterior Attempt one:    Cardioversion mode:  Synchronous   Waveform:  Biphasic   Shock (Joules):  150   Shock outcome:  No change in rhythm Attempt two:    Cardioversion mode:  Synchronous   Waveform:  Biphasic   Shock (Joules):  200   Shock outcome:  No change in rhythm Post-procedure details:    Patient status:  Awake   Patient tolerance of procedure:  Tolerated well, no immediate complications   (including critical care time)  Medications Ordered in ED Medications  diltiazem (CARDIZEM) 1 mg/mL load via infusion 10 mg (10 mg Intravenous Bolus from Bag 11/02/16 2008)    And  diltiazem (CARDIZEM) 100 mg in dextrose 5 % 100 mL (1 mg/mL) infusion (15 mg/hr Intravenous Restarted 11/02/16 2355)  furosemide (LASIX) tablet 40 mg (not administered)  sodium chloride flush (NS) 0.9 % injection 3 mL (3 mLs Intravenous Not Given 11/03/16 0000)  aspirin EC tablet 81 mg (not administered)  atorvastatin (LIPITOR) tablet 80 mg (not administered)  metoprolol (LOPRESSOR) tablet 50 mg (not administered)  sodium chloride 0.9 % bolus 500 mL (0 mLs Intravenous Stopped 11/02/16 2130)  etomidate (AMIDATE) injection 10 mg (10 mg Intravenous Given 11/02/16 2145)  ondansetron (ZOFRAN) injection 4 mg (4 mg Intravenous Given by Other 11/02/16 2158)  ondansetron (ZOFRAN) injection (4 mg Intravenous Given 11/02/16 2158)  ondansetron (ZOFRAN) injection 4 mg (4 mg Intravenous Given 11/02/16 2253)     Initial Impression / Assessment and Plan / ED Course  I have reviewed the triage vital signs and the nursing  notes.  Pertinent labs & imaging results that were available during my care of the patient were reviewed by me and considered in my medical decision making (see chart for details).    Chadsvasc of 1 for female.   Patient presents with new onset A. fib. Chads score of 1. Onset was in the last 48 hours however she was not able to be cardioverted successfully in the ER. Initial troponin negative. Will admit to cardiology.  .  Final Clinical Impressions(s) / ED Diagnoses   Final diagnoses:  Atrial fibrillation with rapid ventricular response [facility])    New Prescriptions Current Discharge Medication List  Davonna Belling, MD 11/03/16 [phone]

## 2016-11-02 NOTE — ED Triage Notes (Signed)
Pt reports central chest pain that radiates to left side of chest as well, pain described as dull and tight, onset tonight around 1900 associated with SOB, headache and states that she has felt dizzy all day today.

## 2016-11-03 ENCOUNTER — Other Ambulatory Visit: Payer: Self-pay

## 2016-11-03 ENCOUNTER — Observation Stay (HOSPITAL_BASED_OUTPATIENT_CLINIC_OR_DEPARTMENT_OTHER): Payer: BLUE CROSS/BLUE SHIELD

## 2016-11-03 DIAGNOSIS — R0602 Shortness of breath: Secondary | ICD-10-CM | POA: Diagnosis not present

## 2016-11-03 DIAGNOSIS — R55 Syncope and collapse: Secondary | ICD-10-CM | POA: Diagnosis not present

## 2016-11-03 DIAGNOSIS — E876 Hypokalemia: Secondary | ICD-10-CM

## 2016-11-03 DIAGNOSIS — I4891 Unspecified atrial fibrillation: Secondary | ICD-10-CM | POA: Diagnosis not present

## 2016-11-03 DIAGNOSIS — G479 Sleep disorder, unspecified: Secondary | ICD-10-CM | POA: Diagnosis not present

## 2016-11-03 DIAGNOSIS — R51 Headache: Secondary | ICD-10-CM | POA: Diagnosis not present

## 2016-11-03 DIAGNOSIS — R079 Chest pain, unspecified: Secondary | ICD-10-CM | POA: Diagnosis present

## 2016-11-03 LAB — COMPREHENSIVE METABOLIC PANEL
ALBUMIN: 3.7 g/dL (ref 3.5–5.0)
ALT: 29 U/L (ref 14–54)
ANION GAP: 11 (ref 5–15)
AST: 27 U/L (ref 15–41)
Alkaline Phosphatase: 64 U/L (ref 38–126)
BILIRUBIN TOTAL: 1.3 mg/dL — AB (ref 0.3–1.2)
BUN: 18 mg/dL (ref 6–20)
CO2: 25 mmol/L (ref 22–32)
Calcium: 9 mg/dL (ref 8.9–10.3)
Chloride: 105 mmol/L (ref 101–111)
Creatinine, Ser: 0.69 mg/dL (ref 0.44–1.00)
GFR calc non Af Amer: >60 mL/min (ref >60)
GLUCOSE: 105 mg/dL — AB (ref 65–99)
POTASSIUM: 3.3 mmol/L — AB (ref 3.5–5.1)
SODIUM: 141 mmol/L (ref 135–145)
TOTAL PROTEIN: 6.2 g/dL — AB (ref 6.5–8.1)

## 2016-11-03 LAB — GLUCOSE, CAPILLARY: Glucose-Capillary: 97 mg/dL (ref 65–99)

## 2016-11-03 LAB — CBC
HEMATOCRIT: 39.3 % (ref 36.0–46.0)
HEMOGLOBIN: 13 g/dL (ref 12.0–15.0)
MCH: 28.7 pg (ref 26.0–34.0)
MCHC: 33.1 g/dL (ref 30.0–36.0)
MCV: 86.8 fL (ref 78.0–100.0)
Platelets: 176 10*3/uL (ref 150–400)
RBC: 4.53 MIL/uL (ref 3.87–5.11)
RDW: 14.2 % (ref 11.5–15.5)
WBC: 6.2 10*3/uL (ref 4.0–10.5)

## 2016-11-03 LAB — HEPARIN LEVEL (UNFRACTIONATED)
HEPARIN UNFRACTIONATED: 0.53 [IU]/mL (ref 0.30–0.70)
HEPARIN UNFRACTIONATED: 0.47 [IU]/mL (ref 0.30–0.70)

## 2016-11-03 LAB — ECHOCARDIOGRAM COMPLETE
HEIGHTINCHES: 67 in
WEIGHTICAEL: 3398.4 [oz_av]

## 2016-11-03 LAB — LIPID PANEL
Cholesterol: 232 mg/dL — ABNORMAL HIGH (ref 0–200)
HDL: 74 mg/dL (ref >40)
LDL Cholesterol: 145 mg/dL — ABNORMAL HIGH (ref 0–99)
TRIGLYCERIDES: 63 mg/dL (ref <150)
Total CHOL/HDL Ratio: 3.1 RATIO
VLDL: 13 mg/dL (ref 0–40)

## 2016-11-03 LAB — PROTIME-INR
INR: 1.02
PROTHROMBIN TIME: 13.4 s (ref 11.4–15.2)

## 2016-11-03 LAB — TROPONIN I: Troponin I: <0.03 ng/mL (ref <0.03)

## 2016-11-03 LAB — BRAIN NATRIURETIC PEPTIDE: B NATRIURETIC PEPTIDE 5: 40.1 pg/mL (ref 0.0–100.0)

## 2016-11-03 LAB — HIV ANTIBODY (ROUTINE TESTING W REFLEX): HIV Screen 4th Generation wRfx: NONREACTIVE

## 2016-11-03 MED ORDER — APIXABAN 5 MG PO TABS: 5.0000 mg | ORAL_TABLET | Freq: Two times a day (BID) | ORAL | 0 refills | Status: DC

## 2016-11-03 MED ORDER — METOPROLOL TARTRATE 25 MG PO TABS
25.0000 mg | ORAL_TABLET | Freq: Two times a day (BID) | ORAL | Status: DC
  Administered 2016-11-03: 25 mg via ORAL
  Filled 2016-11-03: qty 1

## 2016-11-03 MED ORDER — METOPROLOL TARTRATE 25 MG PO TABS: 25.0000 mg | ORAL_TABLET | Freq: Once | ORAL | 2 refills | Status: DC | PRN

## 2016-11-03 MED ORDER — HEPARIN (PORCINE) IN NACL 100-0.45 UNIT/ML-% IJ SOLN
1250.0000 [IU]/h | INTRAMUSCULAR | Status: DC
  Administered 2016-11-03: 1250 [IU]/h via INTRAVENOUS
  Filled 2016-11-03: qty 250

## 2016-11-03 MED ORDER — POTASSIUM CHLORIDE CRYS ER 20 MEQ PO TBCR
40.0000 meq | EXTENDED_RELEASE_TABLET | Freq: Once | ORAL | Status: AC
  Administered 2016-11-03: 40 meq via ORAL
  Filled 2016-11-03: qty 2

## 2016-11-03 MED ORDER — HEPARIN BOLUS VIA INFUSION
4000.0000 [IU] | Freq: Once | INTRAVENOUS | Status: AC
  Administered 2016-11-03: 4000 [IU] via INTRAVENOUS
  Filled 2016-11-03: qty 4000

## 2016-11-03 MED ORDER — DIPHENHYDRAMINE HCL 25 MG PO CAPS
50.0000 mg | ORAL_CAPSULE | Freq: Once | ORAL | Status: AC
  Administered 2016-11-03: 50 mg via ORAL
  Filled 2016-11-03: qty 2

## 2016-11-03 MED ORDER — TRIAMTERENE-HCTZ 37.5-25 MG PO CAPS: 1.0000 | ORAL_CAPSULE | Freq: Every day | ORAL | 3 refills | Status: DC

## 2016-11-03 NOTE — Progress Notes (Signed)
ANTICOAGULATION CONSULT NOTE - Initial Consult  Pharmacy Consult for Heparin Indication: atrial fibrillation  No Known Allergies  Patient Measurements: Height: 5\' 7"  (170.2 cm) Weight: 212 lb 6.4 oz (96.3 kg) IBW/kg (Calculated) : 61.6 Heparin Dosing Weight: 83 kg  Vital Signs: Temp: 98.6 F (37 C) (02/20 1924) Temp Source: Oral (02/20 1924) BP: 114/71 (02/20 2320) Pulse Rate: 104 (02/20 2320)  Labs:  Recent Labs  11/02/16 1928  HGB 14.1  HCT 41.4  PLT 213  CREATININE 0.86    Estimated Creatinine Clearance: 82.9 mL/min (by C-G formula based on SCr of 0.86 mg/dL).   Medical History: Past Medical History:  Diagnosis Date  . Breast cancer ([place])   . Lymphedema 2013  . Osteoarthritis     Medications:  Prescriptions Prior to Admission  Medication Sig Dispense Refill Last Dose  . B Complex Vitamins (VITAMIN-B COMPLEX PO) Take 2 tablets by mouth daily.   11/02/2016 at am  . Cholecalciferol (VITAMIN D3) 2000 units TABS Take 2,000 Units by mouth daily.   11/02/2016 at am  . furosemide (LASIX) 40 MG tablet Take 40 mg by mouth daily.   11/02/2016 at am  . glucosamine-chondroitin 500-400 MG tablet Take 2 tablets by mouth daily.    11/02/2016 at am  . Multiple Vitamins-Minerals (MULTIVITAMIN ADULT PO) Take 1 tablet by mouth daily.   11/02/2016 at am  . naproxen sodium (ANAPROX) 550 MG tablet Take 550 mg by mouth daily.   0 11/02/2016 at am  . Red Yeast Rice Extract (RED YEAST RICE PO) Take 2 capsules by mouth daily.    11/02/2016 at am    Assessment: 61 y.o. F presents with CP. Found to be in new onset afib with RVR. To begin heparin gtt. No AC PTA. CBC ok on admission.   Goal of Therapy:  Heparin level 0.3-0.7 units/ml Monitor platelets by anticoagulation protocol: Yes   Plan:  Heparin IV bolus 4000 units Heparin gtt at 1250 units/hr Will f/u heparin level in 6 hours Daily heparin level and CBC  Sherlon Handing, PharmD, BCPS Clinical pharmacist, pager  [phone] 11/03/2016,12:07 AM

## 2016-11-03 NOTE — Progress Notes (Signed)
  Echocardiogram 2D Echocardiogram has been performed.  Linda Foster 11/03/2016, 11:28 AM

## 2016-11-03 NOTE — Consult Note (Addendum)
ELECTROPHYSIOLOGY CONSULT NOTE    Patient ID: Linda Foster MRN: [id number], DOB/AGE: 1956-03-02 61 y.o.  Admit date: 11/02/2016 Date of Consult: 11/03/2016   Primary Physician: Reginia Naas, MD Primary Cardiologist: previously Dr. Wynonia Lawman, patient prefers EP to take her case Requesting MD: Dr. Eula Fried  Reason for Consultation: AFib  HPI: Linda Foster is a 61 y.o. female with PMHx of breast cancer s/p lumpectomy/chemo/rad tx 2007 otherwise, no medical hx, she uses lasix for ankle swelling, denies HTN, no DM, prior CVA/TIA, no known CAD/vascular disease.  No other known medical hx, though she does have about 5 years of occasional palpitations.  She reports seeing Dr. Wynonia Lawman 2012 having a normal EKG and echo without further recommendations, she does not recall wearing any kind of monitor.  In the last month she has felt unusually tired, a general lack of energy that she attributed to as caring for her grandchildren (thiugh this is not new), no dizziness, near syncope or syncope.   She has had some palpitations that have lasted an hour to a couple hours here/there, self resolved with rest/relaxing.  She denies any illness/fever of late, perhaps 2 weeks ago a 24 hour GI illness with diarrhea, self resolved she thought ate something bad.  The day before coming in to the hospital she had helped her Mom move her furniture back after getting new carpet, afterwards felt extremely tired, weak.  The next day she went about her day, though having a near syncopal event, had to sit on the floor until it passed, after a minute or so, felt better and did a few things, and had another weak spell and rested some more.  Later she planned to go to dinner with family and turned to do something and became very weak, and again near syncopal, though not resolving and came in.  CP is mentioned in the chart but she denies this, she denies any CP, in-fact, no real chest symptoms, she did not perceive palpitations  either, just very weak.  In the ER attempts at cardioversion x2 were unsuccessful, she was given diltiazem bolus IV and spontaneously converted to SR and in review never started on gtt.  Thromboembolic risk factors, Gender-1) for a CHADSVASc Score of 1\   LABS K+ 3.5 BUN/Creat 23/0.86 WBC 8.5 H/H 14/41 plts 213 poc Trop 0.00 Trop I: <0.03 x3 BNP 40.1 TSH 2.763  Past Medical History:  Diagnosis Date  . Breast cancer ([place])   . Lymphedema 2013  . Osteoarthritis      Surgical History:  Past Surgical History:  Procedure Laterality Date  . ABDOMINAL HYSTERECTOMY    . BREAST LUMPECTOMY Left 2008  . CESAREAN SECTION      x 3  . KNEE ARTHROSCOPY Right 2007     Prescriptions Prior to Admission  Medication Sig Dispense Refill Last Dose  . B Complex Vitamins (VITAMIN-B COMPLEX PO) Take 2 tablets by mouth daily.   11/02/2016 at am  . Cholecalciferol (VITAMIN D3) 2000 units TABS Take 2,000 Units by mouth daily.   11/02/2016 at am  . furosemide (LASIX) 40 MG tablet Take 40 mg by mouth daily.   11/02/2016 at am  . glucosamine-chondroitin 500-400 MG tablet Take 2 tablets by mouth daily.    11/02/2016 at am  . Multiple Vitamins-Minerals (MULTIVITAMIN ADULT PO) Take 1 tablet by mouth daily.   11/02/2016 at am  . naproxen sodium (ANAPROX) 550 MG tablet Take 550 mg by mouth daily.   0 11/02/2016 at am  .  Red Yeast Rice Extract (RED YEAST RICE PO) Take 2 capsules by mouth daily.    11/02/2016 at am    Inpatient Medications:  . aspirin EC  81 mg Oral Daily  . atorvastatin  80 mg Oral q1800  . furosemide  40 mg Oral Daily  . metoprolol tartrate  25 mg Oral BID  . sodium chloride flush  3 mL Intravenous Q12H    Allergies: No Known Allergies  Social History   Social History  . Marital status: Married    Spouse name: N/A  . Number of children: N/A  . Years of education: N/A   Occupational History  . Not on file.   Social History Main Topics  . Smoking status: Never Smoker  .  Smokeless tobacco: Never Used  . Alcohol use No  . Drug use: No  . Sexual activity: Not on file   Other Topics Concern  . Not on file   Social History Narrative  . No narrative on file     Family History  Problem Relation Age of Onset  . Hypertension Mother   . COPD Father   . Cancer Father     Prostate CA  . Cancer Brother     Prostate     Review of Systems: All other systems reviewed and are otherwise negative except as noted above.  Physical Exam: Vitals:   11/02/16 2352 11/03/16 0612 11/03/16 0736 11/03/16 1202  BP: (!) 113/52 105/65 (!) 102/58 (!) 111/55  Pulse: 67 67 (!) 57 62  Resp: 20 18 18 18   Temp: 98.3 F (36.8 C) 98.5 F (36.9 C) 97.9 F (36.6 C) 98.7 F (37.1 C)  TempSrc: Oral Oral Oral Oral  SpO2: 100% 99% 95% 100%  Weight: 212 lb 6.4 oz (96.3 kg)     Height: 5\' 7"  (1.702 m)       GEN- The patient is well appearing, alert and oriented x 3 today.   HEENT: normocephalic, atraumatic; sclera clear, conjunctiva pink; hearing intact; oropharynx clear; neck supple, no JVP Lymph- no cervical lymphadenopathy Lungs- CTA, normal work of breathing.  No wheezes, rales, rhonchi Heart- RRR, no murmurs, rubs or gallops, PMI not laterally displaced GI- soft, non-tender, non-distended Extremities- no clubbing, cyanosis, or edema MS- no significant deformity or atrophy Skin- warm and dry, no rash or lesion Psych- euthymic mood, full affect Neuro- no gross deficits observed  Labs:   Lab Results  Component Value Date   WBC 6.2 11/03/2016   HGB 13.0 11/03/2016   HCT 39.3 11/03/2016   MCV 86.8 11/03/2016   PLT 176 11/03/2016    Recent Labs Lab 11/03/16 0612  NA 141  K 3.3*  CL 105  CO2 25  BUN 18  CREATININE 0.69  CALCIUM 9.0  PROT 6.2*  BILITOT 1.3*  ALKPHOS 64  ALT 29  AST 27  GLUCOSE 105*      Radiology/Studies:  Dg Chest Portable 1 View Result Date: 11/02/2016 CLINICAL DATA:  61 y/o  F; atrial fibrillation and tachycardia. EXAM: PORTABLE  CHEST 1 VIEW COMPARISON:  06/07/2014 chest radiograph FINDINGS: Stable heart size and mediastinal contours are within normal limits. Both lungs are clear. The visualized skeletal structures are unremarkable. Left axillary surgical clips. IMPRESSION: No active disease. Electronically Signed   By: Kristine Garbe M.D.   On: 11/02/2016 20:07    EKG: AFib, 179bpm, QRS 36ms 05/2011: SR, PR 115ms, QRS 15ms, QTc 465ms TELEMETRY: AFib 120-150's has converted to SR, 60's-70's Echo pending 08/25/06: TTE  SUMMARY - Overall left ventricular systolic function was normal. Left    ventricular ejection fraction was estimated to be 60 % ,    range being 55 % to 65 %. There were no left ventricular    regional wall motion abnormalities. Left ventricular    diastolic function parameters were normal.    Assessment and Plan:   1. New onset AFib, RVR     CHA2DS2Vasc is 1 for female (hx normal echo, this admit echo is pending)  2. Fatigue/presyncope  3. Hypokalemia  4. Sleep disordered breathing   She had unsuccessful DCCV x2 in the ED though did ultimately convert I will discuss with Dr. Caryl Comes if this warrants a month of a/c By symptoms perhaps she had been in AF for the last month Would plan for EM to better assess her AF burden perhaps, though low dose BB given very rapid rates may be beneficial.  Dr. Caryl Comes to see    Signed, Tommye Standard, PA-C 11/03/2016 1:01 PM  Pt with weakness and fatigue the last days and only with some palpitations-- suggesting that the afib may have been present for more than just the few hours  He will be important to determine the atrial fibrillation burden. 30 day event recorder or pulse oximeter might be useful in this regard. The latter certainly cheaper.  Her CHADS-VASc score is 1 (gender)  She will not need long-term anticoagulation but is appreciated by RU-PA anticoagulation for one month following attempted cardioversion? Successful  cardioversion particularly with the unknown antecedent history is appropriate. We will begin her on apixoban twice daily. She'll take for one month.  She has hypokalemic on her furosemide which she takes for unilateral asymmetric edema and has for years. We'll change her to triamterene hydrochlorothiazide hopes of minimizing the risks of hypokalemia  She has sleep disordered breathing and needs an outpatient sleep study

## 2016-11-03 NOTE — Progress Notes (Addendum)
ANTICOAGULATION CONSULT NOTE  Pharmacy Consult for Heparin Indication: atrial fibrillation  No Known Allergies  Patient Measurements: Height: 5\' 7"  (170.2 cm) Weight: 212 lb 6.4 oz (96.3 kg) IBW/kg (Calculated) : 61.6 Heparin Dosing Weight: 83 kg  Vital Signs: Temp: 97.9 F (36.6 C) (02/21 0736) Temp Source: Oral (02/21 0736) BP: 102/58 (02/21 0736) Pulse Rate: 57 (02/21 0736)  Labs:  Recent Labs  11/02/16 1928 11/03/16 0011 11/03/16 0612 11/03/16 1126  HGB 14.1  --  13.0  --   HCT 41.4  --  39.3  --   PLT 213  --  176  --   LABPROT  --   --  13.4  --   INR  --   --  1.02  --   HEPARINUNFRC  --   --  0.53 0.47  CREATININE 0.86  --  0.69  --   TROPONINI  --  <0.03 <0.03 <0.03    Estimated Creatinine Clearance: 89.1 mL/min (by C-G formula based on SCr of 0.69 mg/dL).   Medical History: Past Medical History:  Diagnosis Date  . Breast cancer ([place])   . Lymphedema 2013  . Osteoarthritis     Medications:  Prescriptions Prior to Admission  Medication Sig Dispense Refill Last Dose  . B Complex Vitamins (VITAMIN-B COMPLEX PO) Take 2 tablets by mouth daily.   11/02/2016 at am  . Cholecalciferol (VITAMIN D3) 2000 units TABS Take 2,000 Units by mouth daily.   11/02/2016 at am  . furosemide (LASIX) 40 MG tablet Take 40 mg by mouth daily.   11/02/2016 at am  . glucosamine-chondroitin 500-400 MG tablet Take 2 tablets by mouth daily.    11/02/2016 at am  . Multiple Vitamins-Minerals (MULTIVITAMIN ADULT PO) Take 1 tablet by mouth daily.   11/02/2016 at am  . naproxen sodium (ANAPROX) 550 MG tablet Take 550 mg by mouth daily.   0 11/02/2016 at am  . Red Yeast Rice Extract (RED YEAST RICE PO) Take 2 capsules by mouth daily.    11/02/2016 at am    Assessment: 61 y.o. F presents with CP. Found to be in new onset afib with RVR. To begin heparin gtt. No AC PTA. CBC ok on admission.  Initial HL is therapeutic at 0.53 on heparin 1250 units/hr. No issues with infusion or bleeding  noted.   Goal of Therapy:  Heparin level 0.3-0.7 units/ml Monitor platelets by anticoagulation protocol: Yes   Plan:  Heparin 1250 units/hr 6h confirmatory HL Daily heparin level and CBC   Andrey Cota. Diona Foley, PharmD, BCPS Clinical Pharmacist [phone] 11/03/2016,7:41 AM   ADDN: Confirmatory heparin level remains therapeutic. Will continue heparin 1250 units/hr and recheck level in the morning.  Andrey Cota. Diona Foley, PharmD, BCPS Clinical Pharmacist

## 2016-11-03 NOTE — Progress Notes (Signed)
Pt has orders to be discharged. Discharge instructions given and pt has no additional questions at this time. Medication regimen reviewed and pt educated. Pt verbalized understanding and has no additional questions. Telemetry box removed. IV removed and site in good condition. Pt stable and waiting for transportation.   Kazuko Clemence RN 

## 2016-11-03 NOTE — Discharge Summary (Signed)
ELECTROPHYSIOLOGY PROCEDURE DISCHARGE SUMMARY    Patient ID: Linda Foster,  MRN: [id number], DOB/AGE: 61/10/1955 61 y.o.  Admit date: 11/02/2016 Discharge date: 11/03/2016  Primary Care Physician: Reginia Naas, MD  Primary Cardiologist, historically Dr. Wynonia Lawman, patient prefers EP as cardiologist now given AF Electrophysiologist: new to Dr. Caryl Comes  Primary Discharge Diagnosis:  1. New atrial Fibrillation     RVR     CHA2DS2Vasc is one for female  Secondary Discharge Diagnosis:  1. none  No Known Allergies   Procedures This Admission:   Cardioversion Date/Time: 11/02/2016 8:00 PM Performed by: Davonna Belling Authorized by: Davonna Belling  Consent:    Consent obtained:  Verbal   Consent given by:  Patient   Risks discussed:  Cutaneous burn and death   Alternatives discussed:  No treatment and alternative treatment Pre-procedure details:    Rhythm:  Atrial fibrillation   Electrode placement:  Anterior-posterior Attempt one:    Cardioversion mode:  Synchronous   Waveform:  Biphasic   Shock (Joules):  150   Shock outcome:  No change in rhythm Attempt two:    Cardioversion mode:  Synchronous   Waveform:  Biphasic   Shock (Joules):  200   Shock outcome:  No change in rhythm Post-procedure details:    Patient status:  Awake   Patient tolerance of procedure:  Tolerated well, no immediate complications  (including critical care time)  Brief HPI: Linda Foster is a 60 y.o. female was admitted yesterday with recurrent near syncope, found in rapid AFib  Hospital Course:  The patient was found in the ER to have rapid AFib with rates 170's, there were two unsuccessful attempts at DCCV, though she was given diltiazem bolus and verbally from her RN converted without the gtt though appears she did get gtt for a couple hours and converted.  There was mention of CP but the patient denied this, and no palpitations, only weak/near syncopal epiosdes.  Her Troponins  were negative, her SR EKG without ischemic changes today.  An echo was ordered and done, pending the result.  Her VSS have been stable and remains in SR and she is without complaints.  She had been feeling unusually fatigued in the last month, this may have been AF.  She is advised ti invest in a pulse ox by Dr. Caryl Comes to try and establish how much AF she has.  She was started on lopressor 25mg  BID here, she is instructed to use this if she has AF that persists or is symptomatic with.  She takes lasix for chronic L ankle swelling (years), she was hypokalemic and is recommended to stop this and use support stocking but she prefers not to, reporting that she had high potassium when taking K+ replacement and this was stopped.  Will change her to maxide.  Given she had attempted DCCV and converted only a few hours afterwards, feel it is indicated to use 30 days of a/c given her low chadsvasc score.  I discussed this with the patient, she is agreeable, we discussed bleeding risk and signs of bleeding, to notify us if any.  We will follow up in the office in the next few weeks to f/u and review her echo result as well.  The patient was examined by Dr. Caryl Comes and considered stable for discharge to home.    Physical Exam: Vitals:   11/02/16 2352 11/03/16 0612 11/03/16 0736 11/03/16 1202  BP: (!) 113/52 105/65 (!) 102/58 (!) 111/55  Pulse: 67  67 (!) 57 62  Resp: 20 18 18 18   Temp: 98.3 F (36.8 C) 98.5 F (36.9 C) 97.9 F (36.6 C) 98.7 F (37.1 C)  TempSrc: Oral Oral Oral Oral  SpO2: 100% 99% 95% 100%  Weight: 212 lb 6.4 oz (96.3 kg)     Height: 5\' 7"  (1.702 m)        Labs:   Lab Results  Component Value Date   WBC 6.2 11/03/2016   HGB 13.0 11/03/2016   HCT 39.3 11/03/2016   MCV 86.8 11/03/2016   PLT 176 11/03/2016     Recent Labs Lab 11/03/16 0612  NA 141  K 3.3*  CL 105  CO2 25  BUN 18  CREATININE 0.69  CALCIUM 9.0  PROT 6.2*  BILITOT 1.3*  ALKPHOS 64  ALT 29  AST 27  GLUCOSE  105*    Discharge Medications:  Allergies as of 11/03/2016   No Known Allergies     Medication List    STOP taking these medications   furosemide 40 MG tablet Commonly known as:  LASIX     TAKE these medications   apixaban 5 MG Tabs tablet Commonly known as:  ELIQUIS Take 1 tablet (5 mg total) by mouth 2 (two) times daily.   glucosamine-chondroitin 500-400 MG tablet Take 2 tablets by mouth daily.   metoprolol tartrate 25 MG tablet Commonly known as:  LOPRESSOR Take 1 tablet (25 mg total) by mouth once as needed (for palpitations/AFib as directed).   MULTIVITAMIN ADULT PO Take 1 tablet by mouth daily.   naproxen sodium 550 MG tablet Commonly known as:  ANAPROX Take 550 mg by mouth daily.   RED YEAST RICE PO Take 2 capsules by mouth daily.   triamterene-hydrochlorothiazide 37.5-25 MG capsule Commonly known as:  DYAZIDE Take 1 each (1 capsule total) by mouth daily.   Vitamin D3 2000 units Tabs Take 2,000 Units by mouth daily.   VITAMIN-B COMPLEX PO Take 2 tablets by mouth daily.       Disposition:  Home Discharge Instructions    Diet - low sodium heart healthy    Complete by:  As directed    Increase activity slowly    Complete by:  As directed      Follow-up Information    Baldwin Jamaica, PA-C Follow up on 11/24/2016.   Specialty:  Cardiology Why:  8:00AM Contact information: [place] [address] [phone]           Duration of Discharge Encounter: Greater than 30 minutes including physician time.  Venetia Night, PA-C 11/03/2016 4:23 PM

## 2016-11-03 NOTE — H&P (Signed)
CARDIOLOGY INPATIENT HISTORY AND PHYSICAL EXAMINATION NOTE  Patient ID: Linda Foster MRN: [id number], DOB/AGE: [birth year]   Admit date: 11/02/2016   Primary Physician: Reginia Naas, MD Primary Cardiologist: Landry Corporal MD  Reason for admission: Presyncope  HPI: This is a 61 y.o. WF without known CAD presented with new onset atrial fibrillation to the ED.  Patient has been having palpitations since last couple of years when she saw Dr. Wynonia Lawman and was recommended monitoring device which did not demonstrate any significant arrhythmia. However for the last couple of days patient has been feeling well. Today in the morning she developed headache, chest pain and SOB. She developed a few episodes of lightheadedness as well. She denied hemoptysis, recent infection, prior history of afib.  In the ED, she was cardioverted by the ED physician several times and finally cardioverted to NSR at 200 J. Admitted for observation and r/o ACS.   Problem List: Past Medical History:  Diagnosis Date  . Breast cancer ([place])   . Lymphedema 2013  . Osteoarthritis     Past Surgical History:  Procedure Laterality Date  . ABDOMINAL HYSTERECTOMY    . BREAST LUMPECTOMY Left 2008  . CESAREAN SECTION      x 3  . KNEE ARTHROSCOPY Right 2007     Allergies: No Known Allergies   Home Medications Current Facility-Administered Medications  Medication Dose Route Frequency Provider Last Rate Last Dose  . aspirin EC tablet 81 mg  81 mg Oral Daily Flossie Dibble, MD      . atorvastatin (LIPITOR) tablet 80 mg  80 mg Oral q1800 Flossie Dibble, MD      . diltiazem (CARDIZEM) 100 mg in dextrose 5 % 100 mL (1 mg/mL) infusion  5-15 mg/hr Intravenous Continuous Davonna Belling, MD 15 mL/hr at 11/02/16 2355 15 mg/hr at 11/02/16 2355  . furosemide (LASIX) tablet 40 mg  40 mg Oral Daily Flossie Dibble, MD      . heparin ADULT infusion 100 units/mL (25000 units/259mL sodium chloride 0.45%)  1,250 Units/hr  Intravenous Continuous Franky Macho, RPH 12.5 mL/hr at 11/03/16 0114 1,250 Units/hr at 11/03/16 0114  . metoprolol (LOPRESSOR) tablet 50 mg  50 mg Oral BID Flossie Dibble, MD   50 mg at 11/03/16 0035  . sodium chloride flush (NS) 0.9 % injection 3 mL  3 mL Intravenous Q12H Flossie Dibble, MD         Family History  Problem Relation Age of Onset  . Hypertension Mother   . COPD Father   . Cancer Father     Prostate CA  . Cancer Brother     Prostate     Social History   Social History  . Marital status: Married    Spouse name: N/A  . Number of children: N/A  . Years of education: N/A   Occupational History  . Not on file.   Social History Main Topics  . Smoking status: Never Smoker  . Smokeless tobacco: Never Used  . Alcohol use No  . Drug use: No  . Sexual activity: Not on file   Other Topics Concern  . Not on file   Social History Narrative  . No narrative on file     Review of Systems: General: negative for chills, fever, night sweats or weight changes.  Cardiovascular: chest pain, dyspnea palpitations,negative for dyspnea on exertion, edema, orthopnea,  paroxysmal nocturnal dyspnea Dermatological: negative for rash Respiratory: negative for cough or wheezing Urologic: negative for  hematuria Abdominal: negative for nausea, vomiting, diarrhea, bright red blood per rectum, melena, or hematemesis Neurologic: negative for visual changes, syncope, or dizziness Endocrine: no diabetes, no hypothyroidism Immunological: no lymph adenopathy Psych: non homicidal/suicidal  Physical Exam: Vitals: BP (!) 113/52 (BP Location: Left Arm)   Pulse 67   Temp 98.3 F (36.8 C) (Oral)   Resp 20   Ht 5\' 7"  (1.702 m)   Wt 96.3 kg (212 lb 6.4 oz)   LMP  (Approximate)   SpO2 100%   BMI 33.27 kg/m  General: not in acute distress Neck: JVP flat, neck supple Heart: regular rate and rhythm, S1, S2, no murmurs  Lungs: CTAB  GI: non tender, non distended, bowel sounds  present Extremities: no edema Neuro: AAO x 3  Psych: normal affect, no anxiety   Labs:   Results for orders placed or performed during the hospital encounter of 11/02/16 (from the past 24 hour(s))  Basic metabolic panel     Status: Abnormal   Collection Time: 11/02/16  7:28 PM  Result Value Ref Range   Sodium 142 135 - 145 mmol/L   Potassium 3.5 3.5 - 5.1 mmol/L   Chloride 105 101 - 111 mmol/L   CO2 23 22 - 32 mmol/L   Glucose, Bld 108 (H) 65 - 99 mg/dL   BUN 23 (H) 6 - 20 mg/dL   Creatinine, Ser 0.86 0.44 - 1.00 mg/dL   Calcium 10.0 8.9 - 10.3 mg/dL   GFR calc non Af Amer >60 >60 mL/min   GFR calc Af Amer >60 >60 mL/min   Anion gap 14 5 - 15  CBC     Status: None   Collection Time: 11/02/16  7:28 PM  Result Value Ref Range   WBC 8.5 4.0 - 10.5 K/uL   RBC 4.84 3.87 - 5.11 MIL/uL   Hemoglobin 14.1 12.0 - 15.0 g/dL   HCT 41.4 36.0 - 46.0 %   MCV 85.5 78.0 - 100.0 fL   MCH 29.1 26.0 - 34.0 pg   MCHC 34.1 30.0 - 36.0 g/dL   RDW 13.7 11.5 - 15.5 %   Platelets 213 150 - 400 K/uL  Magnesium     Status: None   Collection Time: 11/02/16  7:28 PM  Result Value Ref Range   Magnesium 2.2 1.7 - 2.4 mg/dL  I-stat troponin, ED     Status: None   Collection Time: 11/02/16  8:01 PM  Result Value Ref Range   Troponin i, poc 0.00 0.00 - 0.08 ng/mL   Comment 3          TSH     Status: None   Collection Time: 11/02/16 10:20 PM  Result Value Ref Range   TSH 2.763 0.350 - 4.500 uIU/mL  Brain natriuretic peptide     Status: None   Collection Time: 11/03/16 12:11 AM  Result Value Ref Range   B Natriuretic Peptide 40.1 0.0 - 100.0 pg/mL  Troponin I     Status: None   Collection Time: 11/03/16 12:11 AM  Result Value Ref Range   Troponin I <0.03 <0.03 ng/mL     Radiology/Studies: Dg Chest Portable 1 View  Result Date: 11/02/2016 CLINICAL DATA:  61 y/o  F; atrial fibrillation and tachycardia. EXAM: PORTABLE CHEST 1 VIEW COMPARISON:  06/07/2014 chest radiograph FINDINGS: Stable heart  size and mediastinal contours are within normal limits. Both lungs are clear. The visualized skeletal structures are unremarkable. Left axillary surgical clips. IMPRESSION: No active disease. Electronically Signed  By: Kristine Garbe M.D.   On: 11/02/2016 20:07    EKG: atrial fibrillation with RVR   Echo: 08/25/2006 - Overall left ventricular systolic function was normal. Left    ventricular ejection fraction was estimated to be 60 % ,    range being 55 % to 65 %. There were no left ventricular    regional wall motion abnormalities. Left ventricular    diastolic function parameters were normal.   Medical decision making:  Discussed care with the patient Discussed care with the physician on the phone Reviewed labs and imaging personally Reviewed prior records  ASSESSMENT AND PLAN:  This is a 61 y.o. WF with new onset atrial fibrillation    Active Problems:   Atrial fibrillation with RVR ([facility])   Chest pain with moderate risk of acute coronary syndrome  New onset atrial fibrillation with RVR, s/p DCCV Non valvular atrial fibrillation, unclear trigger, (CHADS2VASC = 1 (female) Maintained on anticoagulation due to cardioversion attempts, discussed with her a/c which need to be started in the morning. Currently on IV heparin drip.  Echo in the AM Checking TSH, electrolytes Keep potassium >4, calcium >9, magnesium >2 Started on low dose beta blocker  Atypical cardiac chest pain, possible ACS Admit for observation Cycle troponin Serial EKGs Consider cardiac stress test to risk stratify in AM vs. D/c with outpatient follow up  NPO post midnight  Lipid panel, HbA1c, TSH Aspirin and statin  Echocardiogram in AM    Signed, Flossie Dibble, MD MS 11/03/2016, 2:05 AM

## 2016-11-04 ENCOUNTER — Ambulatory Visit (HOSPITAL_COMMUNITY): Payer: Self-pay | Admitting: Nurse Practitioner

## 2016-11-04 LAB — HEMOGLOBIN A1C
HEMOGLOBIN A1C: 5.6 % (ref 4.8–5.6)
Mean Plasma Glucose: 114

## 2016-11-05 ENCOUNTER — Telehealth: Payer: Self-pay | Admitting: Interventional Cardiology

## 2016-11-05 NOTE — Telephone Encounter (Signed)
Patient called reporting few hours of left forearm swelling, redness and pain. She was started recently on Eliquis and wonders if it's related. She denies having any bruise on this side. She says forearm is worm with some erythema and swelling. She denies fevers, chills, or other symptoms. She was advised to take some Motrin and see if it helps. Otherwise she can always come to ED for evaluation. She agrees with the plan.

## 2016-11-06 ENCOUNTER — Emergency Department (HOSPITAL_COMMUNITY)
Admission: EM | Admit: 2016-11-06 | Discharge: 2016-11-06 | Disposition: A | Discharge status: Home / Self Care | Payer: BLUE CROSS/BLUE SHIELD | Attending: Emergency Medicine | Admitting: Emergency Medicine

## 2016-11-06 ENCOUNTER — Emergency Department (HOSPITAL_BASED_OUTPATIENT_CLINIC_OR_DEPARTMENT_OTHER)
Admit: 2016-11-06 | Discharge: 2016-11-06 | Disposition: A | Discharge status: Home / Self Care | Payer: BLUE CROSS/BLUE SHIELD

## 2016-11-06 ENCOUNTER — Encounter (HOSPITAL_COMMUNITY): Payer: Self-pay | Admitting: Vascular Surgery

## 2016-11-06 DIAGNOSIS — L03114 Cellulitis of left upper limb: Secondary | ICD-10-CM | POA: Insufficient documentation

## 2016-11-06 DIAGNOSIS — Z853 Personal history of malignant neoplasm of breast: Secondary | ICD-10-CM | POA: Insufficient documentation

## 2016-11-06 DIAGNOSIS — Z7901 Long term (current) use of anticoagulants: Secondary | ICD-10-CM | POA: Diagnosis not present

## 2016-11-06 DIAGNOSIS — Z79899 Other long term (current) drug therapy: Secondary | ICD-10-CM | POA: Insufficient documentation

## 2016-11-06 DIAGNOSIS — M7989 Other specified soft tissue disorders: Secondary | ICD-10-CM | POA: Diagnosis not present

## 2016-11-06 MED ORDER — CEPHALEXIN 500 MG PO CAPS: 500.0000 mg | ORAL_CAPSULE | Freq: Four times a day (QID) | ORAL | 0 refills | Status: DC

## 2016-11-06 NOTE — Progress Notes (Signed)
*  PRELIMINARY RESULTS* Vascular Ultrasound Left upper extremity venous duplex has been completed.  Preliminary findings: The visualized veins of the left upper extremity are negative for deep and superficial vein thrombosis.  Everrett Coombe 11/06/2016, 12:49 PM

## 2016-11-06 NOTE — ED Notes (Signed)
Pt back from vascular, back on monitor.

## 2016-11-06 NOTE — ED Provider Notes (Signed)
[facility] DEPT Provider Note   CSN: [id number] Arrival date & time: 11/06/16  1113     History   Chief Complaint Chief Complaint  Patient presents with  . Arm Swelling    HPI Linda Foster is a 61 y.o. female.  HPI   61 year old female presents today with complaints of redness and swelling to her wrist and hand.  Patient notes symptoms started yesterday with swelling to the dorsal aspect of her left forearm and hand with associated redness, and warmth.  Patient reports very minimal discomfort, full active range of motion of the hand and wrist.  She denies any history of the same, denies any fever, nausea, or any other systemic complaints.  Patient was recently hospitalized with atrial fibrillation, started on Eliquis and metoprolol.  She notes she has not been in A. fib, has not had use metoprolol, and has been taking the Eliquis as directed.  She denies any placement of IV catheters in the left extremity.  Patient does note a history of breast cancer with lymphedema, notes this is similar to previous lymphedema episodes.  She has no swelling to the proximal upper extremity.  History of the same.  Past Medical History:  Diagnosis Date  . Breast cancer ([place])   . Lymphedema 2013  . Osteoarthritis     Patient Active Problem List   Diagnosis Date Noted  . Chest pain with moderate risk of acute coronary syndrome 11/03/2016  . Atrial fibrillation with rapid ventricular response ([place]) 11/02/2016  . Breast cancer ([place]) 11/06/2012    Past Surgical History:  Procedure Laterality Date  . ABDOMINAL HYSTERECTOMY    . BREAST LUMPECTOMY Left 2008  . CESAREAN SECTION      x 3  . KNEE ARTHROSCOPY Right 2007    OB History    No data available       Home Medications    Prior to Admission medications   Medication Sig Start Date End Date Taking? Authorizing Provider  apixaban (ELIQUIS) 5 MG TABS tablet Take 1 tablet (5 mg total) by mouth 2 (two) times daily. 11/03/16   Renee  Dyane Dustman, PA-C  B Complex Vitamins (VITAMIN-B COMPLEX PO) Take 2 tablets by mouth daily.    Historical Provider, MD  cephALEXin (KEFLEX) 500 MG capsule Take 1 capsule (500 mg total) by mouth 4 (four) times daily. 11/06/16   Okey Regal, PA-C  Cholecalciferol (VITAMIN D3) 2000 units TABS Take 2,000 Units by mouth daily.    Historical Provider, MD  glucosamine-chondroitin 500-400 MG tablet Take 2 tablets by mouth daily.     Historical Provider, MD  metoprolol tartrate (LOPRESSOR) 25 MG tablet Take 1 tablet (25 mg total) by mouth once as needed (for palpitations/AFib as directed). 11/03/16   Renee Dyane Dustman, PA-C  Multiple Vitamins-Minerals (MULTIVITAMIN ADULT PO) Take 1 tablet by mouth daily.    Historical Provider, MD  naproxen sodium (ANAPROX) 550 MG tablet Take 550 mg by mouth daily.  10/11/16   Historical Provider, MD  Red Yeast Rice Extract (RED YEAST RICE PO) Take 2 capsules by mouth daily.     Historical Provider, MD  triamterene-hydrochlorothiazide (DYAZIDE) 37.5-25 MG capsule Take 1 each (1 capsule total) by mouth daily. 11/03/16   Renee Dyane Dustman, PA-C    Family History Family History  Problem Relation Age of Onset  . Hypertension Mother   . COPD Father   . Cancer Father     Prostate CA  . Cancer Brother     Prostate  Social History Social History  Substance Use Topics  . Smoking status: Never Smoker  . Smokeless tobacco: Never Used  . Alcohol use No     Allergies   Patient has no known allergies.   Review of Systems Review of Systems  All other systems reviewed and are negative.    Physical Exam Updated Vital Signs BP 125/71 (BP Location: Right Arm)   Pulse 75   Temp 98.7 F (37.1 C) (Oral)   Resp 16   SpO2 100%   Physical Exam  Constitutional: She is oriented to person, place, and time. She appears well-developed and well-nourished.  HENT:  Head: Normocephalic and atraumatic.  Eyes: Conjunctivae are normal. Pupils are equal, round, and reactive to  light. Right eye exhibits no discharge. Left eye exhibits no discharge. No scleral icterus.  Neck: Normal range of motion. No JVD present. No tracheal deviation present.  Pulmonary/Chest: Effort normal. No stridor.  Musculoskeletal:  Warmth to touch of left dorsal hand and wrist   Neurological: She is alert and oriented to person, place, and time. Coordination normal.  Psychiatric: She has a normal mood and affect. Her behavior is normal. Judgment and thought content normal.  Nursing note and vitals reviewed.        ED Treatments / Results  Labs (all labs ordered are listed, but only abnormal results are displayed) Labs Reviewed - No data to display  EKG  EKG Interpretation None       Radiology No results found.  Procedures Procedures (including critical care time)  Medications Ordered in ED Medications - No data to display   Initial Impression / Assessment and Plan / ED Course  I have reviewed the triage vital signs and the nursing notes.  Pertinent labs & imaging results that were available during my care of the patient were reviewed by me and considered in my medical decision making (see chart for details).      Final Clinical Impressions(s) / ED Diagnoses   Final diagnoses:  Cellulitis of left upper extremity    Labs:    Imaging: Upper extremity venous-no DVT  Consults:  Therapeutics:  Discharge Meds:   Assessment/Plan:  Pt presents with likely cellulitis of her hand.  This is along the dorsal aspect with some extension into the palmar aspect.  No signs of joint involvement.  She is afebrile nontoxic.  No recent punctures or trauma to the skin.  No history of MRSA MSSA infections.  No history of diabetes.  Patient will be treated with Keflex, close follow-up with primary care provider in 2 days, return precautions given.  She verbalized understanding and agreement to this plan had no further questions or concerns at the time of discharge.   Pt's ring  removed, she is encouraged to keep it off until swelling improves      New Prescriptions Current Discharge Medication List    START taking these medications   Details  cephALEXin (KEFLEX) 500 MG capsule Take 1 capsule (500 mg total) by mouth 4 (four) times daily. Qty: 40 capsule, Refills: 0         Okey Regal, PA-C 11/06/16 1421    Virgel Manifold, MD 11/22/16 1426

## 2016-11-06 NOTE — Discharge Instructions (Signed)
Please read attached information. If you experience any new or worsening signs or symptoms please return to the emergency room for evaluation. Please follow-up with your primary care provider or specialist as discussed. Please use medication prescribed only as directed and discontinue taking if you have any concerning signs or symptoms.   °

## 2016-11-06 NOTE — ED Notes (Signed)
Pt transported to vascular.  °

## 2016-11-06 NOTE — ED Triage Notes (Signed)
Pt reports to the ED for eval of left arm pain and swelling. Pt reports she was seen here on 02/20 and she was found to be in atrial fibrillation and they placed her on Eloquis and a PRN metoprolol. She has not had to take metoprolol but she has been compliant with her blood thinner. She states that last night she noticed her left forearm and hand had increased swelling, erythema, warmth, and pain. She has hx of lymphedema but it usually only causes swelling. Went to [facility] and she was sent here for DVT r/o. Denies any CP or SOB.

## 2016-11-10 ENCOUNTER — Telehealth: Payer: Self-pay | Admitting: *Deleted

## 2016-11-10 DIAGNOSIS — I4891 Unspecified atrial fibrillation: Secondary | ICD-10-CM

## 2016-11-10 NOTE — Telephone Encounter (Signed)
-----   Message from Theodoro Parma, RN sent at 11/10/2016  9:58 AM EST ----- Regarding: Sleep study Gae Bon, please order sleep study for afib under [facility]. Please call the patient to schedule and review information.  Thanks!! ----- Message ----- From: Baldwin Jamaica, PA-C Sent: 11/08/2016   8:14 AM To: Theodoro Parma, RN  Can you reach out to this patient to get sleep study done?  We saw her in the hospital with new AFib, suspect OSA  Thanks Joseph Art

## 2016-11-10 NOTE — Telephone Encounter (Signed)
Scheduled

## 2016-11-17 ENCOUNTER — Encounter: Payer: Self-pay | Admitting: Physician Assistant

## 2016-11-24 ENCOUNTER — Encounter: Payer: Self-pay | Admitting: Physician Assistant

## 2016-11-30 ENCOUNTER — Other Ambulatory Visit: Payer: Self-pay | Admitting: Physician Assistant

## 2016-12-20 ENCOUNTER — Encounter: Payer: Self-pay | Admitting: Physician Assistant

## 2016-12-27 DIAGNOSIS — M1990 Unspecified osteoarthritis, unspecified site: Secondary | ICD-10-CM | POA: Diagnosis not present

## 2016-12-27 DIAGNOSIS — Z1211 Encounter for screening for malignant neoplasm of colon: Secondary | ICD-10-CM | POA: Diagnosis not present

## 2016-12-27 DIAGNOSIS — R7303 Prediabetes: Secondary | ICD-10-CM | POA: Diagnosis not present

## 2016-12-27 DIAGNOSIS — E785 Hyperlipidemia, unspecified: Secondary | ICD-10-CM | POA: Diagnosis not present

## 2016-12-27 DIAGNOSIS — Z0000 Encounter for general adult medical examination without abnormal findings: Secondary | ICD-10-CM | POA: Diagnosis not present

## 2016-12-27 DIAGNOSIS — R600 Localized edema: Secondary | ICD-10-CM | POA: Diagnosis not present

## 2016-12-30 ENCOUNTER — Encounter (HOSPITAL_BASED_OUTPATIENT_CLINIC_OR_DEPARTMENT_OTHER): Payer: BLUE CROSS/BLUE SHIELD

## 2017-01-13 NOTE — Telephone Encounter (Signed)
Called the patient and LMTCB and left message to call [facility] at [phone] ext [phone]. Patient insurance denied her split night study so a in home sleep study was ordered. Fax came today stating the sleep study has been cancelled because they were unable to reach the patient.

## 2017-01-14 NOTE — Telephone Encounter (Addendum)
Patient called in today stating she had been calling [facility] to schedule her in home sleep study but has never gotten an answer. I reached out to [facility] today spoke with Almyra Free and asked her to reach back out to the patient. She stated she would call the patient on phone number [phone] which is the best contact number the patient gave me. Waiting to get a sleep study date. Test scheduled for 02/04/2017

## 2017-01-17 NOTE — Telephone Encounter (Signed)
Fax came in today from [facility] for the approval of patients home sleep study with an authorization # [id number]. Date of service is My 7 2018.

## 2017-01-25 DIAGNOSIS — L570 Actinic keratosis: Secondary | ICD-10-CM | POA: Diagnosis not present

## 2017-01-26 ENCOUNTER — Encounter (INDEPENDENT_AMBULATORY_CARE_PROVIDER_SITE_OTHER): Payer: Self-pay

## 2017-01-26 ENCOUNTER — Ambulatory Visit (INDEPENDENT_AMBULATORY_CARE_PROVIDER_SITE_OTHER): Payer: BLUE CROSS/BLUE SHIELD | Admitting: Internal Medicine

## 2017-01-26 ENCOUNTER — Encounter: Payer: Self-pay | Admitting: Internal Medicine

## 2017-01-26 VITALS — BP 120/70 | HR 65 | Ht 66.0 in | Wt 206.0 lb

## 2017-01-26 DIAGNOSIS — I480 Paroxysmal atrial fibrillation: Secondary | ICD-10-CM

## 2017-01-26 MED ORDER — POTASSIUM CHLORIDE ER 10 MEQ PO TBCR: EXTENDED_RELEASE_TABLET | ORAL | 3 refills | Status: DC

## 2017-01-26 MED ORDER — FUROSEMIDE 40 MG PO TABS: 40.0000 mg | ORAL_TABLET | Freq: Every day | ORAL | 3 refills | Status: AC

## 2017-01-26 MED ORDER — FUROSEMIDE 40 MG PO TABS: 40.0000 mg | ORAL_TABLET | Freq: Every day | ORAL | Status: DC

## 2017-01-26 MED ORDER — POTASSIUM CHLORIDE ER 10 MEQ PO TBCR: EXTENDED_RELEASE_TABLET | ORAL | Status: DC

## 2017-01-26 NOTE — Patient Instructions (Addendum)
Medication Instructions: - Your physician has recommended you make the following change in your medication: 1) Stop dyazide (triameterene-hctz) 2) Start furosemide 40 mg- take one tablet by mouth once daily 3) Start potassium 10 meq- take one tablet by mouth once every other day   Labwork: - Your physician recommends that you return for lab work in: 3 weeks (BMP) & 8 weeks (BMP)  1) Monday 02/21/17 (7:30 am- 5:00 pm) 2) Monday 03/28/17 (7:30 am- 5:00 pm)   Procedures/Testing: - none ordered  Follow-Up: - Dr. Caryl Comes will see you back on an as needed basis.  Any Additional Special Instructions Will Be Listed Below (If Applicable).     If you need a refill on your cardiac medications before your next appointment, please call your pharmacy.

## 2017-01-26 NOTE — Progress Notes (Signed)
Patient Care Team: Carol Ada, MD as PCP - General (Family Medicine)   HPI  Linda Foster is a 61 y.o. female Seen in follow-up for an episode of atrial fibrillation 2/18 which occurred once  she had presented to the emergency room with symptoms of chest pain and lightheadedness/presyncope. Efforts to cardiovert or unsuccessful. She then converted spontaneously. CHADS-VASc score is only 1 echocardiogram/18 demonstrated normal LV function and normal left atrial size   She has a history of peripheral edema. Furosemide therapy was complicated by hypokalemia and we try Dyazide. It has not been successful.   She's had some interval palpitations. These typically lasts seconds.     Past Medical History:  Diagnosis Date  . Breast cancer ([place])   . Lymphedema 2013  . Osteoarthritis     Past Surgical History:  Procedure Laterality Date  . ABDOMINAL HYSTERECTOMY    . BREAST LUMPECTOMY Left 2008  . CESAREAN SECTION      x 3  . KNEE ARTHROSCOPY Right 2007    Current Outpatient Prescriptions  Medication Sig Dispense Refill  . B Complex Vitamins (VITAMIN-B COMPLEX PO) Take 2 tablets by mouth daily.    . Cholecalciferol (VITAMIN D3) 2000 units TABS Take 2,000 Units by mouth daily.    Marland Kitchen glucosamine-chondroitin 500-400 MG tablet Take 2 tablets by mouth daily.     . metoprolol tartrate (LOPRESSOR) 25 MG tablet Take 1 tablet (25 mg total) by mouth once as needed (for palpitations/AFib as directed). 30 tablet 2  . Multiple Vitamins-Minerals (MULTIVITAMIN ADULT PO) Take 1 tablet by mouth daily.    . naproxen sodium (ANAPROX) 550 MG tablet Take 550 mg by mouth daily.   0  . Red Yeast Rice Extract (RED YEAST RICE PO) Take 2 capsules by mouth daily.     Marland Kitchen triamterene-hydrochlorothiazide (DYAZIDE) 37.5-25 MG capsule Take 1 each (1 capsule total) by mouth daily. 30 capsule 3   No current facility-administered medications for this visit.     No Known Allergies    Review of Systems  negative except from HPI and PMH  Physical Exam BP 120/70   Pulse 65   Ht 5\' 6"  (1.676 m)   Wt 206 lb (93.4 kg)   SpO2 95%   BMI 33.25 kg/m  Well developed and nourished in no acute distress HENT normal Neck supple with JVP-flat Clear Regular rate and rhythm, no murmurs or gallops Abd-soft with active BS No Clubbing cyanosis 1+ edema Skin-warm and dry A & Oriented  Grossly normal sensory and motor function   ECG demonstrated sinus rhythm at 69 intervals 13/08/42 Otherwise normal   Assessment and  Plan  Atrial fibrillation-paroxysmal  Palpitations question mechanism  Peripheral edema  Her palpitations are relatively brief and I suspect are not atrial fibrillation. Furthermore, her ability to discern palpitations was not clear when she was having her sig significant preadmission symptoms. Hence, I instructed her to use the pulse oximeter regular time during her day where she can identify a normal and then look for variations of normal as a potential marker of atrial fibrillation  We will change her Dyazide to furosemide. We will add low-dose potassium. With her history of hyperkalemia we'll plan to check his 3 weeks and then again in 8 weeks.  Given her CHADS-VASc score of only 1-gender-no anticoagulation is indicated. She will follow-up with Dr. Tamala Julian. We will see her as needed.  We spent more than 50% of our >25 min visit in face to  face counseling regarding the above    Current medicines are reviewed at length with the patient today .  The patient does not have concerns regarding medicines.

## 2017-02-08 NOTE — Telephone Encounter (Addendum)
Informed patient of upcoming home sleep study and patient understanding was verbalized. Patient understands she will be contacted by [facility] for set up. Patient understands her sleep study is scheduled for Friday 02/04/2017. She understands to call if [facility] does not contact her with new setup in a timely manner. Patient explained that her sleep study did not arrive on Friday 02/04/2017 as expected but [facility] reached out to her to inform her it would be delivered today Tuesday 5/59/2018. Patient states she has to go out of town and would start her sleep study when she returns from her trip. Patient states her husband will be at home to receive the sleep study delivery when it arrives.  She understands she will be called once confirmation has been received from [facility] that she has received her new machine to schedule 10 week follow up appointment. She was grateful for the call and thanked me.

## 2017-02-16 ENCOUNTER — Encounter: Payer: Self-pay | Admitting: Cardiology

## 2017-02-21 ENCOUNTER — Other Ambulatory Visit: Payer: BLUE CROSS/BLUE SHIELD

## 2017-02-28 ENCOUNTER — Other Ambulatory Visit: Payer: BLUE CROSS/BLUE SHIELD | Admitting: *Deleted

## 2017-02-28 DIAGNOSIS — I480 Paroxysmal atrial fibrillation: Secondary | ICD-10-CM | POA: Diagnosis not present

## 2017-02-28 LAB — BASIC METABOLIC PANEL
BUN/Creatinine Ratio: 20 (ref 12–28)
BUN: 14 mg/dL (ref 8–27)
CALCIUM: 9.4 mg/dL (ref 8.7–10.3)
CHLORIDE: 103 mmol/L (ref 96–106)
CO2: 25 mmol/L (ref 20–29)
CREATININE: 0.71 mg/dL (ref 0.57–1.00)
GFR calc non Af Amer: 93 mL/min/{1.73_m2} (ref >59)
GFR, EST AFRICAN AMERICAN: 107 mL/min/{1.73_m2} (ref >59)
Glucose: 78 mg/dL (ref 65–99)
Potassium: 3.9 mmol/L (ref 3.5–5.2)
Sodium: 143 mmol/L (ref 134–144)

## 2017-03-09 DIAGNOSIS — G4733 Obstructive sleep apnea (adult) (pediatric): Secondary | ICD-10-CM | POA: Diagnosis not present

## 2017-03-14 ENCOUNTER — Telehealth: Payer: Self-pay | Admitting: *Deleted

## 2017-03-14 DIAGNOSIS — G4733 Obstructive sleep apnea (adult) (pediatric): Secondary | ICD-10-CM

## 2017-03-14 NOTE — Telephone Encounter (Signed)
-----   Message from Sueanne Margarita, MD sent at 03/11/2017  4:43 PM EDT ----- Home sleep study showed mild OSA with AHI of 12/hr.  Please set up CPAP titration.

## 2017-03-14 NOTE — Telephone Encounter (Signed)
Informed patient of sleep study results and patient understanding was verbalized. Patient understands Dr Radford Pax has recommended a CPAP titration to treat her OSA. Patient agrees with treatment and thanked me for the call.

## 2017-03-15 ENCOUNTER — Encounter: Payer: Self-pay | Admitting: *Deleted

## 2017-03-22 NOTE — Telephone Encounter (Signed)
Informed patient of upcoming sleep study and patient understanding was verbalized. Patient understands her CPAP titration is scheduled for Thursday May 12 2017. Patient understands her sleep study will be done at [facility] sleep lab. Patient understands she will receive a sleep packet in a week or so. Patient understands to call if she does not receive the sleep packet in a timely manner. Patient agrees with treatment and thanked me for call

## 2017-03-28 ENCOUNTER — Other Ambulatory Visit: Payer: BLUE CROSS/BLUE SHIELD

## 2017-04-15 ENCOUNTER — Ambulatory Visit (HOSPITAL_BASED_OUTPATIENT_CLINIC_OR_DEPARTMENT_OTHER): Payer: BLUE CROSS/BLUE SHIELD | Attending: Cardiology | Admitting: Cardiology

## 2017-04-15 VITALS — Ht 66.0 in | Wt 190.0 lb

## 2017-04-15 DIAGNOSIS — G4733 Obstructive sleep apnea (adult) (pediatric): Secondary | ICD-10-CM | POA: Diagnosis not present

## 2017-04-15 DIAGNOSIS — R0683 Snoring: Secondary | ICD-10-CM | POA: Diagnosis not present

## 2017-04-15 DIAGNOSIS — G4730 Sleep apnea, unspecified: Secondary | ICD-10-CM | POA: Diagnosis not present

## 2017-04-18 NOTE — Procedures (Signed)
   Patient Name: Linda Foster, Linda Foster Date: 04/15/2017 Gender: Female D.O.B: [birth year] Age (years): 44 Referring Provider: Fransico Him MD, ABSM Height (inches): 66 Interpreting Physician: Fransico Him MD, ABSM Weight (lbs): 190 RPSGT: [name] BMI: 31 MRN: [id number] Neck Size: 15.00  CLINICAL INFORMATION The patient is referred for a CPAP titration to treat sleep apnea.  SLEEP STUDY TECHNIQUE As per the AASM Manual for the Scoring of Sleep and Associated Events v2.3 (April 2016) with a hypopnea requiring 4% desaturations.  The channels recorded and monitored were frontal, central and occipital EEG, electrooculogram (EOG), submentalis EMG (chin), nasal and oral airflow, thoracic and abdominal wall motion, anterior tibialis EMG, snore microphone, electrocardiogram, and pulse oximetry. Continuous positive airway pressure (CPAP) was initiated at the beginning of the study and titrated to treat sleep-disordered breathing.  MEDICATIONS Medications self-administered by patient taken the night of the study : N/A  TECHNICIAN COMMENTS Comments added by technician: NONE  Comments added by scorer: N/A  RESPIRATORY PARAMETERS Optimal PAP Pressure (cm): 8  AHI at Optimal Pressure (/hr):0.0 Overall Minimal O2 (%):89.00 Supine % at Optimal Pressure (%):40 Minimal O2 at Optimal Pressure (%): 89.0    SLEEP ARCHITECTURE The study was initiated at 10:24:49 PM and ended at 5:30:09 AM.  Sleep onset time was 1.2 minutes and the sleep efficiency was 91.5%. The total sleep time was 389.1 minutes.  The patient spent 4.88% of the night in stage N1 sleep, 65.79% in stage N2 sleep, 0.00% in stage N3 and 29.33% in REM.Stage REM latency was 71.0 minutes  Wake after sleep onset was 35.0. Alpha intrusion was absent. Supine sleep was 32.44%.  CARDIAC DATA The 2 lead EKG demonstrated sinus rhythm. The mean heart rate was 62.29 beats per minute. Other EKG findings include: None.  LEG  MOVEMENT DATA The total Periodic Limb Movements of Sleep (PLMS) were 197. The PLMS index was 30.38. A PLMS index of <15 is considered normal in adults.  IMPRESSIONS - The optimal PAP pressure was 8 cm of water. - Central sleep apnea was not noted during this titration (CAI = 0.0/h). - Mild oxygen desaturations were observed during this titration (min O2 = 89.00%). - The patient snored with Soft snoring volume during this titration study. - No cardiac abnormalities were observed during this study. - Moderate periodic limb movements were observed during this study. Arousals associated with PLMs were rare.  DIAGNOSIS - Obstructive Sleep Apnea (327.23 [G47.33 ICD-10])  RECOMMENDATIONS - Trial of CPAP therapy on 8 cm H2O with a Small size Resmed Full Face Mask AirFit F20 mask and heated humidification. - Avoid alcohol, sedatives and other CNS depressants that may worsen sleep apnea and disrupt normal sleep architecture. - Sleep hygiene should be reviewed to assess factors that may improve sleep quality. - Weight management and regular exercise should be initiated or continued. - Return to Sleep Center for re-evaluation after 10 weeks of therapy  [place], [facility]  ELECTRONICALLY SIGNED ON:  04/18/2017, 12:07 PM [place] PH: [phone]   FX: [phone] [place]

## 2017-04-19 ENCOUNTER — Telehealth: Payer: Self-pay | Admitting: *Deleted

## 2017-04-19 NOTE — Telephone Encounter (Signed)
-----   Message from Sueanne Margarita, MD sent at 04/18/2017 12:10 PM EDT ----- Please let patient know that they had a successful PAP titration and let DME know that orders are in EPIC.  Please set up 10 week OV with me.

## 2017-04-19 NOTE — Telephone Encounter (Signed)
Informed patient of Titration results and patient understanding was verbalized. Patient understands she will be contacted by [facility] to set up her cpap. She understands to call if [facility] does not contact her with new setup in a timely manner. She understands she will be called once confirmation has been received from [facility] that she has received her new machine to schedule 10 week follow up appointment.  [place] notified of new cpap order in epic Please add to Linda Foster She was grateful for the call and thanked me

## 2017-04-26 DIAGNOSIS — H6121 Impacted cerumen, right ear: Secondary | ICD-10-CM | POA: Diagnosis not present

## 2017-04-26 DIAGNOSIS — J069 Acute upper respiratory infection, unspecified: Secondary | ICD-10-CM | POA: Diagnosis not present

## 2017-05-02 DIAGNOSIS — H524 Presbyopia: Secondary | ICD-10-CM | POA: Diagnosis not present

## 2017-05-12 ENCOUNTER — Encounter (HOSPITAL_BASED_OUTPATIENT_CLINIC_OR_DEPARTMENT_OTHER): Payer: BLUE CROSS/BLUE SHIELD

## 2017-05-18 DIAGNOSIS — G4733 Obstructive sleep apnea (adult) (pediatric): Secondary | ICD-10-CM | POA: Diagnosis not present

## 2017-06-08 NOTE — Telephone Encounter (Signed)
Called patient to schedule her 10 week follow up appointment and patient stated she could not tolerate the CPAP so she turned it back in to [facility] on 06/01/17. Patient states she could not get any sleep with the unit.

## 2017-06-09 NOTE — Telephone Encounter (Signed)
Please find out if she is interested in seeing a dentist for an oral device instead

## 2017-06-10 NOTE — Telephone Encounter (Signed)
Called patient to ask if she would be willing to see a dentist for an oral device instead of the CPAP unit. Patient states yes she is interested and is willing to try the oral device if Dr Radford Pax thinks it will work.

## 2017-06-13 NOTE — Telephone Encounter (Signed)
Please refer to Dr. Toy Cookey for oral device for  OSA

## 2017-06-23 NOTE — Telephone Encounter (Signed)
OSA referral faxed to Augustina Mood with a confirmation of information received by [facility].

## 2017-07-19 DIAGNOSIS — R7303 Prediabetes: Secondary | ICD-10-CM | POA: Diagnosis not present

## 2017-07-19 DIAGNOSIS — E785 Hyperlipidemia, unspecified: Secondary | ICD-10-CM | POA: Diagnosis not present

## 2017-07-19 DIAGNOSIS — R600 Localized edema: Secondary | ICD-10-CM | POA: Diagnosis not present

## 2017-07-19 DIAGNOSIS — I480 Paroxysmal atrial fibrillation: Secondary | ICD-10-CM | POA: Diagnosis not present

## 2017-08-31 ENCOUNTER — Encounter: Payer: Self-pay | Admitting: Podiatry

## 2017-08-31 ENCOUNTER — Ambulatory Visit (INDEPENDENT_AMBULATORY_CARE_PROVIDER_SITE_OTHER): Payer: BLUE CROSS/BLUE SHIELD | Admitting: Podiatry

## 2017-08-31 DIAGNOSIS — L600 Ingrowing nail: Secondary | ICD-10-CM | POA: Diagnosis not present

## 2017-08-31 NOTE — Patient Instructions (Signed)

## 2017-08-31 NOTE — Progress Notes (Signed)
Subjective:   Patient ID: Linda Foster, female   DOB: 61 y.o.   MRN: [id number]   HPI Patient presents with a damaged right hallux nail that is been incurvated sore and has been this way for a number years.  Has had a previous ingrown done which did not help the overall nail and it is becoming increasingly difficult for her to wear shoe gear with comfortably   Review of Systems  All other systems reviewed and are negative.       Objective:  Physical Exam  Constitutional: She appears well-developed and well-nourished.  Cardiovascular: Intact distal pulses.  Pulmonary/Chest: Effort normal.  Musculoskeletal: Normal range of motion.  Neurological: She is alert.  Skin: Skin is warm.  Nursing note and vitals reviewed.   Neurovascular status found to be intact muscle strength adequate range of motion within normal limits with patient noted to have a damaged thickened incurvated right hallux nail that is painful when pressed and make shoe gear difficult.  Patient is found to have good digital perfusion and is well oriented x3 and does not smoke and would like to be more active.     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  Patient wants this fixed permanently and I explained surgery to remove the nail with chemical application and explained the risk and allowed to sign consent form explaining the nail will not regrow.  Patient wants surgery gives approval in today I infiltrated 60 mg Xylocaine Marcaine mixture remove the hallux nail exposed matrix and applied phenol 5 applications 30 seconds followed by alcohol lavage sterile dressing and gave instructions on soaks.  Patient will be seen back and is encouraged to call with any questions she may have

## 2017-09-12 ENCOUNTER — Ambulatory Visit (INDEPENDENT_AMBULATORY_CARE_PROVIDER_SITE_OTHER): Payer: BLUE CROSS/BLUE SHIELD | Admitting: Podiatry

## 2017-09-12 ENCOUNTER — Encounter: Payer: Self-pay | Admitting: Podiatry

## 2017-09-12 DIAGNOSIS — L03031 Cellulitis of right toe: Secondary | ICD-10-CM

## 2017-09-12 MED ORDER — CEPHALEXIN 500 MG PO CAPS: 500.0000 mg | ORAL_CAPSULE | Freq: Three times a day (TID) | ORAL | 0 refills | Status: DC

## 2017-09-13 HISTORY — PX: BREAST RECONSTRUCTION: SHX9

## 2017-10-03 DIAGNOSIS — Z1231 Encounter for screening mammogram for malignant neoplasm of breast: Secondary | ICD-10-CM | POA: Diagnosis not present

## 2017-10-03 DIAGNOSIS — Z853 Personal history of malignant neoplasm of breast: Secondary | ICD-10-CM | POA: Diagnosis not present

## 2017-10-13 DIAGNOSIS — R921 Mammographic calcification found on diagnostic imaging of breast: Secondary | ICD-10-CM | POA: Diagnosis not present

## 2017-10-13 DIAGNOSIS — R922 Inconclusive mammogram: Secondary | ICD-10-CM | POA: Diagnosis not present

## 2017-10-20 ENCOUNTER — Other Ambulatory Visit: Payer: Self-pay | Admitting: Radiology

## 2017-10-20 DIAGNOSIS — Z853 Personal history of malignant neoplasm of breast: Secondary | ICD-10-CM | POA: Diagnosis not present

## 2017-10-20 DIAGNOSIS — Z0000 Encounter for general adult medical examination without abnormal findings: Secondary | ICD-10-CM | POA: Diagnosis not present

## 2017-10-20 DIAGNOSIS — R920 Mammographic microcalcification found on diagnostic imaging of breast: Secondary | ICD-10-CM | POA: Diagnosis not present

## 2017-10-20 DIAGNOSIS — R921 Mammographic calcification found on diagnostic imaging of breast: Secondary | ICD-10-CM | POA: Diagnosis not present

## 2017-10-20 DIAGNOSIS — D0512 Intraductal carcinoma in situ of left breast: Secondary | ICD-10-CM | POA: Diagnosis not present

## 2017-10-20 DIAGNOSIS — D0592 Unspecified type of carcinoma in situ of left breast: Secondary | ICD-10-CM | POA: Diagnosis not present

## 2017-10-24 ENCOUNTER — Encounter: Payer: Self-pay | Admitting: *Deleted

## 2017-10-24 ENCOUNTER — Telehealth: Payer: Self-pay | Admitting: Hematology

## 2017-10-24 DIAGNOSIS — D0512 Intraductal carcinoma in situ of left breast: Secondary | ICD-10-CM | POA: Insufficient documentation

## 2017-10-24 NOTE — Telephone Encounter (Signed)
Spoke to patient to confirm afternoon [facility] appointment for 2/13, patient is a [facility] patient no packet sent

## 2017-10-25 NOTE — Progress Notes (Signed)
[place]  [phone] [phone]  Clinic New Consult Note   Patient Care Team: Carol Ada, MD as PCP - General (Family Medicine) Lavora Compton, MD as Consulting Physician (Obstetrics and Gynecology) 10/26/2017  CHIEF COMPLAINTS/PURPOSE OF CONSULTATION:  Cancer Staging Ductal carcinoma in situ (DCIS) of left breast Staging form: Breast, AJCC 8th Edition - Clinical stage from 10/20/2017: Stage 0 (cTis (DCIS), cN0, cM0, ER: Positive, PR: Positive, HER2: Not assessed ) - Signed by Truitt Merle, MD on 10/25/2017  SUMMARY OF ONCOLOGIC HISTORY:   Oncology History   Cancer Staging Ductal carcinoma in situ (DCIS) of left breast Staging form: Breast, AJCC 8th Edition - Clinical stage from 10/20/2017: Stage 0 (cTis (DCIS), cN0, cM0, ER: Positive, PR: Positive, HER2: Not assessed ) - Signed by Truitt Merle, MD on 10/25/2017  2008, left breast pathological stage: T2 N1  triple negative invasive ductal carcinoma, grade 3.     History of left breast cancer   08/25/2006 Imaging    MR breast bilateral IMPRESSION: 4.9 cm mass within the upper outer quadrant of the left breast, consistent with known malignancy. Enlarged lower axillary lymph node on the left, consistent with known metastatic disease.      08/27/2006 Imaging    PET Scan IMPRESSION: The left breast primary and left axillary adenopathy are both hypermetabolic. No other areas of hypermetabolism suggest metastatic disease. Neither of the two large liver lesions is hypermetabolic.  Further, a left hepatic dome lesion is partially imaged on the breast MR is more consistent with a hemangioma or less likely cyst.  These lesions could be more entirely evaluated with dedicated hepatic MR or reevaluated on follow-up PET.       09/16/2006 - 11/04/2006 Chemotherapy    The patient had 4 cycles of chemotherapy with FEC for chemotherapy treatment.        11/17/2006 Imaging    MR breast bilateral IMPRESSION: No  residual enhancement of the left upper outer quadrant breast cancer.  The previously seen left axillary adenopathy has also resolved.      11/18/2006 - 12/02/2006 Chemotherapy    The patient had 2 cycles of chemotherapy with Taxotere for chemotherapy treatment.        12/16/2006 - 01/06/2007 Chemotherapy    The patient had  2 cycles of chemotherapy with Abraxane for chemotherapy treatment.        01/09/2007 Imaging    MR breast bilateral IMPRESSION: No residual enhancement in the upper outer quadrant of the left breast.  Left axillary adenopathy is no longer present.      02/01/2007 Initial Diagnosis    History of left breast cancer      2008 Mammogram    Routine screening mammogram IMPRESSION: Palpable mass 3 cm by lobe with this centimeter focus of calcifications. Ultrasound confirmed the presence of a hypoechoic mass measuring about 5 cm in maximum diameter. There was a 2 cm hypoechoic lymph nodes seen.       02/01/2007 Pathology Results    LEFT BREAST, NEEDLE LOCALIZATION BIOPSY IMPRESSION: DENSE FIBROSIS. NO RESIDUAL TUMOR IDENTIFIED. LYMPH NODE, LEFT AXILLARY, EXCISION: NO TUMOR IDENTIFIED IN TEN LYMPH NODES, SEE COMMENT      2008 Pathology Results     Biopsy: Both breasts and lymph nodes showed metastatic carcinoma. ER/PR positive, Ki-67 52%, HER-2/neu 1+.        03/28/2007 - 04/21/2007 Radiation Therapy    Radiation Therapy treatment       Ductal carcinoma in situ (DCIS) of left  breast   10/13/2017 Mammogram    Unilateral left diagnostic mammography with tomography and left breast ultrasonography IMPRESSION: The new grouped calcifications in the left breast are suspicious of malignancy.      10/20/2017 Pathology Results    Left needle core biopsy with pathology showing: IMPRESSION: Breast, left, needle core biopsy with ductal carcinoma in situ (DCIS), intermediate nuclear grade, solid and cribriform type with central necrosis and associated microcalcifications. PROGNOSTIC  INDICATORS Results: IMMUNOHISTOCHEMICAL AND MORPHOMETRIC ANALYSIS PERFORMED MANUALLY Estrogen Receptor: 95%, POSITIVE, STRONG STAINING INTENSITY Progesterone Receptor: 80%, POSITIVE, STRONG STAINING INTENSITY      10/24/2017 Initial Diagnosis    Ductal carcinoma in situ (DCIS) of left breast       HISTORY OF PRESENTING ILLNESS:  Linda Foster 62 y.o. female is here because of newly diagnosed left breast cancer. She is accompanied by her husband today. She initially presented for a routine screening due to personal history of breast cancer with a left lumpectomy UOQ in 2008. She denies feeling a lump prior to the mammogram. She was initially seen by Dr. Truddie Coco at the [facility] over 10 years ago and she was discharged from the practice by Dr. Jana Hakim. She reports that for the cancer in 2008, she had chemotherapy, surgery, and radiation with mammograms since. She denies having any issues with chemotherapy.   She had routine screening mammography on 10/03/2017 at [facility] with results showing: IMPRESSION: The 0.7 cm group of calcifications in the left breast is indeterminate. She underwent unilateral left diagnostic mammography with tomography and left breast ultrasonography at [facility] on 10/13/2017 showing: IMPRESSION: The new grouped calcifications in the left breast are suspicious of malignancy.  Accordingly on 10/20/2017 she proceeded to left needle core biopsy with pathology showing: IMPRESSION: Breast, left, needle core biopsy with ductal carcinoma in situ (DCIS), intermediate nuclear grade, solid and cribriform type with central necrosis and associated microcalcifications. Prognostic indicators significant for: ER, 95% positive and PR, 80% positive, both with strong staining intensity.   She reports a PMHx of sleep apnea and notes that she doesn't wear her CPAP machine. She had a partial hysterectomy with only one ovary removed in 2007 due to increased bleeding. She denies cardiac issues or HTN. She  has a family hx of her father with prostate cancer at age 83, her brother had prostate cancer at age 57 and lung cancer at age 70. She denies a family hx of breast cancer. She had a lumpectomy in 2008 to her left breast. She had knee surgery in 2007. She had 3 C-sections. 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   On review of systems, pt reports 6-7/10 intermittent knee pain, arthritic pain, chronic left foot swelling that she treats with Lasix, occasional palpitations, and lymphedema to the let arm s/p treatment in 2008.  She reports that she didn't follow up for the lymphedema clinic following her treatment in 2008. She takes glucosamine for her joint pain. She used to take naprosyn for her arthritic pain. She denies nipple pain/nipple discharge, breast pain, and any other symptoms.   GYN HISTORY  Menarchal: xx 62 years old LMP: xx 2007 following partial hysterectomy Contraceptive:  HRT: GP: G3P3    MEDICAL HISTORY:  Past Medical History:  Diagnosis Date  . Breast cancer ([place])   . Lymphedema 2013  . OSA (obstructive sleep apnea)   . Osteoarthritis     SURGICAL HISTORY: Past Surgical History:  Procedure Laterality Date  .  ABDOMINAL HYSTERECTOMY    . BREAST LUMPECTOMY Left 2008  . CESAREAN SECTION      x 3  . KNEE ARTHROSCOPY Right 2007    SOCIAL HISTORY: Social History   Socioeconomic History  . Marital status: Married    Spouse name: Not on file  . Number of children: Not on file  . Years of education: Not on file  . Highest education level: Not on file  Social Needs  . Financial resource strain: Not on file  . Food insecurity - worry: Not on file  . Food insecurity - inability: Not on file  . Transportation needs - medical: Not on file  . Transportation needs - non-medical: Not on file  Occupational History  . Not on file  Tobacco Use  . Smoking status: Never Smoker  . Smokeless tobacco: Never Used  Substance and  Sexual Activity  . Alcohol use: No  . Drug use: No  . Sexual activity: Not on file  Other Topics Concern  . Not on file  Social History Narrative  . Not on file    FAMILY HISTORY: Family History  Problem Relation Age of Onset  . Hypertension Mother   . COPD Father   . Cancer Father 8       Prostate CA  . Cancer Brother 60       Prostate  . Lung cancer Brother     ALLERGIES:  has No Known Allergies.  MEDICATIONS:  Current Outpatient Medications  Medication Sig Dispense Refill  . B Complex Vitamins (VITAMIN-B COMPLEX PO) Take 2 tablets by mouth daily.    . Cholecalciferol (VITAMIN D3) 2000 units TABS Take 2,000 Units by mouth daily.    . furosemide (LASIX) 40 MG tablet Take 40 mg by mouth daily.    Marland Kitchen glucosamine-chondroitin 500-400 MG tablet Take 2 tablets by mouth daily.     . naproxen sodium (ANAPROX) 550 MG tablet Take 550 mg by mouth daily.   0  . potassium chloride (K-DUR) 10 MEQ tablet Take one tablet (10 meq) by mouth once every other day 45 tablet 3  . furosemide (LASIX) 40 MG tablet Take 1 tablet (40 mg total) by mouth daily. 90 tablet 3   No current facility-administered medications for this visit.     REVIEW OF SYSTEMS:   Constitutional: Denies fevers, chills or abnormal night sweats Eyes: Denies blurriness of vision, double vision or watery eyes Ears, nose, mouth, throat, and face: Denies mucositis or sore throat Respiratory: Denies cough, dyspnea or wheezes Cardiovascular: Denies palpitation, chest discomfort or lower extremity swelling Gastrointestinal:  Denies nausea, heartburn or change in bowel habits Skin: Denies abnormal skin rashes Lymphatics: Denies new lymphadenopathy or easy bruising Neurological:Denies numbness, tingling or new weaknesses Behavioral/Psych: Mood is stable, no new changes  All other systems were reviewed with the patient and are negative.  PHYSICAL EXAMINATION:  ECOG PERFORMANCE STATUS: 0 - Asymptomatic  Vitals:   10/26/17  1238  BP: 131/76  Pulse: 77  Resp: 18  Temp: 98.8 F (37.1 C)  SpO2: 100%   Filed Weights   10/26/17 1238  Weight: 192 lb 3.2 oz (87.2 kg)    GENERAL:alert, no distress and comfortable SKIN: skin color, texture, turgor are normal, no rashes or significant lesions EYES: normal, conjunctiva are pink and non-injected, sclera clear OROPHARYNX:no exudate, no erythema and lips, buccal mucosa, and tongue normal  NECK: supple, thyroid normal size, non-tender, without nodularity LYMPH:  no palpable lymphadenopathy in the cervical, axillary or inguinal.  Lymphedema to the left arm. LUNGS: clear to auscultation and percussion with normal breathing effort HEART: regular rate & rhythm and no murmurs and no lower extremity edema ABDOMEN:abdomen soft, non-tender and normal bowel sounds Musculoskeletal:no cyanosis of digits and no clubbing  PSYCH: alert & oriented x 3 with fluent speech NEURO: no focal motor/sensory deficits Breasts: Breast inspection showed them to be symmetrical with no nipple discharge. Palpation of the breasts and axilla revealed no obvious mass that I could appreciate.     LABORATORY DATA:  I have reviewed the data as listed CBC Latest Ref Rng & Units 10/26/2017 11/03/2016 11/02/2016  WBC 3.9 - 10.3 K/uL 5.8 6.2 8.5  Hemoglobin 12.0 - 15.0 g/dL - 13.0 14.1  Hematocrit 34.8 - 46.6 % 41.0 39.3 41.4  Platelets 145 - 400 K/uL 198 176 213    CMP Latest Ref Rng & Units 10/26/2017 02/28/2017 11/03/2016  Glucose 70 - 140 mg/dL 95 78 105(H)  BUN 7 - 26 mg/dL 23 14 18   Creatinine 0.60 - 1.10 mg/dL 0.82 0.71 0.69  Sodium 136 - 145 mmol/L 142 143 141  Potassium 3.5 - 5.1 mmol/L 3.8 3.9 3.3(L)  Chloride 98 - 109 mmol/L 103 103 105  CO2 22 - 29 mmol/L 27 25 25   Calcium 8.4 - 10.4 mg/dL 9.9 9.4 9.0  Total Protein 6.4 - 8.3 g/dL 7.5 - 6.2(L)  Total Bilirubin 0.2 - 1.2 mg/dL 0.9 - 1.3(H)  Alkaline Phos 40 - 150 U/L 84 - 64  AST 5 - 34 U/L 18 - 27  ALT 0 - 55 U/L 15 - 29     PATHOLOGY:  Left needle core biopsy, 10/20/2017 IMPRESSION: Breast, left, needle core biopsy with ductal carcinoma in situ (DCIS), intermediate nuclear grade, solid and cribriform type with central necrosis and associated microcalcifications.  PROGNOSTIC INDICATORS Results: IMMUNOHISTOCHEMICAL AND MORPHOMETRIC ANALYSIS PERFORMED MANUALLY Estrogen Receptor: 95%, POSITIVE, STRONG STAINING INTENSITY Progesterone Receptor: 80%, POSITIVE, STRONG STAINING INTENSITY  RADIOGRAPHIC STUDIES: I have personally reviewed the radiological images as listed and agreed with the findings in the report. No results found.  Routine screening mammography on 10/03/2017 IMPRESSION: The 0.7 cm group of calcifications in the left breast is indeterminate.   Unilateral left diagnostic mammography with tomography and left breast ultrasonography on 10/13/2017 IMPRESSION: The new grouped calcifications in the left breast are suspicious of malignancy.  ASSESSMENT & PLAN:  Linda Foster is a 62 y.o. menopausal female presented with screening discovered to DCIS with a history of lymphedema, osteoarthritis, left breast cancer treated with chemotherapy, radiation, and surgery in 2008   1. Left breast DCIS, Stage 0 (cTis (DCIS), cN0, cM0, ER /PR: Positive, HER2: Not assessed  -I discussed her breast imaging and needle biopsy results with patient and her family members in great detail. -Due to her history of breast cancer and radiation in the same breast, we recommend left breast mastectomy.  Patient was seen by breast surgeon Dr. Marlou Starks today. She is a little reluctant to do mastectomy, and would like to have a second surgical opinion to see if she can hav lumpectomy.  -Given her prior triple negative breast cancer in 20018, and a family history of prostate cancer, we recommend her to undergo genetic testing to ruled out inheritable breast cancer. -Discussed that if the patient's genetic testing returns with BRCA mutation, then  it is recommended that the patient undergo bilateral mastectomy.  -Her DCIS will be cured by complete surgical resection. Any form of adjuvant therapy is preventive. -Given her  strongly positive ER and PR, and prior history of breast cancer, I do recommend antiestrogen therapy with either Tamoxifen or anastrozole once daily for 5 years, which decrease her risk of future breast cancer by ~40%.  -Discussed the side effects of antiestrogen therapy in great detail with the patient and her husband today.  -Given her history of hysterectomy, and arthritis, I think tamoxifen would be a better choice for her. -Discussed the side effects of antiestrogen therapy with the patient and her daughter in detail today. The potential side effects of tamoxifen, which includes but not limited to, hot flash, skin and vaginal dryness, slightly increased risk of cardiovascular disease and cataract, small risk of thrombosis and endometrial cancer, were discussed with her in great details. Preventive strategies for thrombosis, such as being physically active, using compression stocks, avoid cigarette smoking, etc., were reviewed with her.  She has had a hysterectomy, no risk for endometrial cancer. She seems to be interested.  -Due to her prior left breast treatment with radiation in 2008, she is probably not a candidate for radiation.  She will be seen by Dr. Lisbeth Renshaw today.  -We also discussed that biopsy may have sampling limitation, we will review her surgical path, to see if she has any invasive carcinoma components. -We discussed breast cancer surveillance after she completes treatment, Including annual mammogram, breast exam every 6-12 months. -Advised the patient and her husband to eat and maintain a healthy diet and increase with exercise during treatment.   2. History of left breast cancer, cT2N1M0, ER-/PR-/HER2- -status post neoadjuvant chemotherapy with complete pathological response -Status post lumpectomy and  adjuvant radiation -No evidence of recurrence so far   3. Arthritis of knees  -continue NSADs as needed  Plan -She wants to have a second opinion for surgery before proceed  -Genetic refer -I'll see her 2-3 weeks after her surgery to review her pathology result and finalize her antiestrogen therapy.     No orders of the defined types were placed in this encounter.   All questions were answered. The patient knows to call the clinic with any problems, questions or concerns. I spent 45 minutes counseling the patient face to face. The total time spent in the appointment was 60 minutes and more than 50% was on counseling.     Truitt Merle, MD 10/26/2017 6:08 PM   This document serves as a record of services personally performed by Truitt Merle, MD. It was created on her behalf by Steva Colder, a trained medical scribe. The creation of this record is based on the scribe's personal observations and the provider's statements to them.   I have reviewed the above documentation for accuracy and completeness, and I agree with the above.

## 2017-10-26 ENCOUNTER — Encounter: Payer: Self-pay | Admitting: Hematology

## 2017-10-26 ENCOUNTER — Inpatient Hospital Stay: Payer: BLUE CROSS/BLUE SHIELD | Attending: Hematology | Admitting: Hematology

## 2017-10-26 ENCOUNTER — Ambulatory Visit
Admission: RE | Admit: 2017-10-26 | Discharge: 2017-10-26 | Disposition: A | Discharge status: Home / Self Care | Payer: BLUE CROSS/BLUE SHIELD | Source: Ambulatory Visit | Attending: Radiation Oncology | Admitting: Radiation Oncology

## 2017-10-26 ENCOUNTER — Encounter: Payer: Self-pay | Admitting: Physical Therapy

## 2017-10-26 ENCOUNTER — Encounter: Payer: Self-pay | Admitting: General Practice

## 2017-10-26 ENCOUNTER — Inpatient Hospital Stay: Payer: BLUE CROSS/BLUE SHIELD

## 2017-10-26 ENCOUNTER — Ambulatory Visit: Payer: BLUE CROSS/BLUE SHIELD | Attending: General Surgery | Admitting: Physical Therapy

## 2017-10-26 ENCOUNTER — Other Ambulatory Visit: Payer: Self-pay

## 2017-10-26 ENCOUNTER — Telehealth: Payer: Self-pay

## 2017-10-26 VITALS — BP 131/76 | HR 77 | Temp 98.8°F | Resp 18 | Ht 66.0 in | Wt 192.2 lb

## 2017-10-26 DIAGNOSIS — Z170 Estrogen receptor positive status [ER+]: Secondary | ICD-10-CM | POA: Insufficient documentation

## 2017-10-26 DIAGNOSIS — I890 Lymphedema, not elsewhere classified: Secondary | ICD-10-CM | POA: Insufficient documentation

## 2017-10-26 DIAGNOSIS — D0512 Intraductal carcinoma in situ of left breast: Secondary | ICD-10-CM

## 2017-10-26 DIAGNOSIS — Z853 Personal history of malignant neoplasm of breast: Secondary | ICD-10-CM

## 2017-10-26 DIAGNOSIS — M1990 Unspecified osteoarthritis, unspecified site: Secondary | ICD-10-CM | POA: Diagnosis not present

## 2017-10-26 DIAGNOSIS — R293 Abnormal posture: Secondary | ICD-10-CM | POA: Diagnosis not present

## 2017-10-26 LAB — CBC WITH DIFFERENTIAL (CANCER CENTER ONLY)
BASOS ABS: 0 10*3/uL (ref 0.0–0.1)
BASOS PCT: 0 %
Eosinophils Absolute: 0.1 10*3/uL (ref 0.0–0.5)
Eosinophils Relative: 1 %
HEMATOCRIT: 41 % (ref 34.8–46.6)
Hemoglobin: 13.6 g/dL (ref 11.6–15.9)
LYMPHS PCT: 45 %
Lymphs Abs: 2.6 10*3/uL (ref 0.9–3.3)
MCH: 29.7 pg (ref 25.1–34.0)
MCHC: 33.2 g/dL (ref 31.5–36.0)
MCV: 89.5 fL (ref 79.5–101.0)
MONO ABS: 0.3 10*3/uL (ref 0.1–0.9)
Monocytes Relative: 5 %
NEUTROS ABS: 2.8 10*3/uL (ref 1.5–6.5)
Neutrophils Relative %: 49 %
Platelet Count: 198 10*3/uL (ref 145–400)
RBC: 4.58 MIL/uL (ref 3.70–5.45)
RDW: 13.8 % (ref 11.2–14.5)
WBC: 5.8 10*3/uL (ref 3.9–10.3)

## 2017-10-26 LAB — CMP (CANCER CENTER ONLY)
ALBUMIN: 4.5 g/dL (ref 3.5–5.0)
ALT: 15 U/L (ref 0–55)
AST: 18 U/L (ref 5–34)
Alkaline Phosphatase: 84 U/L (ref 40–150)
Anion gap: 12 — ABNORMAL HIGH (ref 3–11)
BUN: 23 mg/dL (ref 7–26)
CHLORIDE: 103 mmol/L (ref 98–109)
CO2: 27 mmol/L (ref 22–29)
CREATININE: 0.82 mg/dL (ref 0.60–1.10)
Calcium: 9.9 mg/dL (ref 8.4–10.4)
GFR, Est AFR Am: >60 mL/min (ref >60)
GFR, Estimated: >60 mL/min (ref >60)
GLUCOSE: 95 mg/dL (ref 70–140)
POTASSIUM: 3.8 mmol/L (ref 3.5–5.1)
Sodium: 142 mmol/L (ref 136–145)
Total Bilirubin: 0.9 mg/dL (ref 0.2–1.2)
Total Protein: 7.5 g/dL (ref 6.4–8.3)

## 2017-10-26 NOTE — Progress Notes (Signed)
Nutrition Assessment  Reason for Assessment:  Pt seen in Breast Clinic  ASSESSMENT:   62 year old female with new diagnosis of breast cancer.  Past medical history reviewed.    Patient reports normal appetite.  Medications:  reviewed  Labs: reviewed  Anthropometrics:   Height: 66 inches Weight: 192 lb 3.2 oz BMI: 31   NUTRITION DIAGNOSIS: Food and nutrition related knowledge deficit related to new diagnosis of breast cancer as evidenced by no prior need for nutrition related information.  INTERVENTION:   Discussed and provided packet of information regarding nutritional tips for breast cancer patients.  Questions answered.  Teachback method used.  Contact information provided and patient knows to contact me with questions/concerns.    MONITORING, EVALUATION, and GOAL: Pt will consume a healthy plant based diet to maintain lean body mass throughout treatment.   Hinley Brimage B. Zenia Resides, [place], [place] Registered Dietitian [phone] (pager)

## 2017-10-26 NOTE — Patient Instructions (Signed)

## 2017-10-26 NOTE — Progress Notes (Signed)
[facility] Psychosocial Distress Screening Spiritual Care  Met with Destenee and husband Konrad Dolores in [facility] to introduce [facility] team/resources, reviewing distress screen per protocol.  The patient scored a 0 on the Psychosocial Distress Thermometer which indicates [minimal] distress. Also assessed for distress and other psychosocial needs.   ONCBCN DISTRESS SCREENING 10/26/2017  Screening Type Initial Screening  Distress experienced in past week (1-10) 0  Referral to support programs Yes   Per pt, this dx is much less stressful than the more involved tx for her previous triple negative dx/tx ten years ago. Per couple, they plan to seek a second opinion as they discern how to move forward.  [place] team and programming available through [employer]. They report good support from family, faith, and church. In fact, each Wednesday they host their seven children and seven grandchildren (all 6y and under) for dinner before going to church together. This is an important meaning-making ritual for them.  Follow up needed: No. Per couple, no other needs at this time. They know to contact team with any needs, questions, or interests, but please also page if immediate needs arise or circumstances change. Thank you.   [place], [place], [facility] Pager [phone] Voicemail [phone]

## 2017-10-26 NOTE — Therapy (Signed)
[place], [place], [address] Phone: [phone]   Fax:  [phone]  Physical Therapy Evaluation  Patient Details  Name: Linda Foster MRN: [id number] Date of Birth: 1956-06-07 Referring Provider: Dr. Autumn Messing   Encounter Date: 10/26/2017  PT End of Session - 10/26/17 1435    Visit Number  1    Number of Visits  2    Date for PT Re-Evaluation  12/21/17    PT Start Time  1310    PT Stop Time  1340    PT Time Calculation (min)  30 min    Activity Tolerance  Patient tolerated treatment well    Behavior During Therapy  [facility] for tasks assessed/performed       Past Medical History:  Diagnosis Date  . Breast cancer ([place])   . Lymphedema 2013  . OSA (obstructive sleep apnea)   . Osteoarthritis     Past Surgical History:  Procedure Laterality Date  . ABDOMINAL HYSTERECTOMY    . BREAST LUMPECTOMY Left 2008  . CESAREAN SECTION      x 3  . KNEE ARTHROSCOPY Right 2007    There were no vitals filed for this visit.   Subjective Assessment - 10/26/17 1406    Subjective  Patient presents to [facility] today to be seen by her medical team for her newly diagnosed left breast cancer.    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 She underwent chemotherapy, a left lumpectomy and axillary lymph node dissection (10 nodes removed), and radiation. She has also had left upper extremity lymphedema since 2009 but has not done treatment for that.    Patient Stated Goals  Reduce lymphedema risk and learn post op shoulder ROM HEP    Currently in Pain?  No/denies         [facility] PT Assessment - 10/26/17 0001      Assessment   Medical Diagnosis  Left breast cancer    Referring Provider  Dr. Autumn Messing    Onset  Date/Surgical Date  10/03/17    Hand Dominance  Right    Prior Therapy  none      Precautions   Precautions  Other (comment)    Precaution Comments  active cancer; left arm lymphedema; previous breast CA      Restrictions   Weight Bearing Restrictions  No      Balance Screen   Has the patient fallen in the past 6 months  No    Has the patient had a decrease in activity level because of a fear of falling?   No    Is the patient reluctant to leave their home because of a fear of falling?   No      Home Environment   Living Environment  Private residence    Living Arrangements  Spouse/significant other    Available Help at Discharge  Family      Prior Function   Level of Reserve  Retired    Leisure  She walks "some"      Cognition   Overall Cognitive Status  Within Functional Limits for tasks assessed      Posture/Postural Control   Posture/Postural Control  Postural limitations    Postural  Limitations  Rounded Shoulders;Forward head      ROM / Strength   AROM / PROM / Strength  AROM;Strength      AROM   AROM Assessment Site  Shoulder;Cervical    Right/Left Shoulder  Right;Left    Right Shoulder Extension  53 Degrees    Right Shoulder Flexion  135 Degrees    Right Shoulder ABduction  139 Degrees    Right Shoulder Internal Rotation  76 Degrees    Right Shoulder External Rotation  88 Degrees    Left Shoulder Extension  52 Degrees    Left Shoulder Flexion  135 Degrees    Left Shoulder ABduction  148 Degrees    Left Shoulder Internal Rotation  77 Degrees    Left Shoulder External Rotation  85 Degrees    Cervical Flexion  WNL    Cervical Extension  WNL    Cervical - Right Side Bend  WNL    Cervical - Left Side Bend  WNL    Cervical - Right Rotation  WNL    Cervical - Left Rotation  WNL      Strength   Overall Strength  Within functional limits for tasks performed        LYMPHEDEMA/ONCOLOGY QUESTIONNAIRE - 10/26/17 1434      Type    Cancer Type  Left breast cancer      Lymphedema Assessments   Lymphedema Assessments  Upper extremities      Right Upper Extremity Lymphedema   10 cm Proximal to Olecranon Process  31.2 cm    Olecranon Process  27 cm    10 cm Proximal to Ulnar Styloid Process  23.4 cm    Just Proximal to Ulnar Styloid Process  17 cm    Across Hand at [employer]  19.1 cm    At [place] of 2nd Digit  6.7 cm      Left Upper Extremity Lymphedema   10 cm Proximal to Olecranon Process  32.1 cm    Olecranon Process  28.3 cm    10 cm Proximal to Ulnar Styloid Process  23 cm    Just Proximal to Ulnar Styloid Process  19.2 cm    Across Hand at [employer]  20.1 cm    At [place] of 2nd Digit  7 cm          Objective measurements completed on examination: See above findings.    Patient was instructed today in a home exercise program today for post op shoulder range of motion. These included active assist shoulder flexion in sitting, scapular retraction, wall walking with shoulder abduction, and hands behind head external rotation.  She was encouraged to do these twice a day, holding 3 seconds and repeating 5 times when permitted by her physician.      PT Education - 10/26/17 1435    Education provided  Yes    Education Details  Lymphedema risk reduction and post op shoulder ROM HEP    Person(s) Educated  Patient;Spouse    Methods  Explanation;Demonstration;Handout    Comprehension  Returned demonstration;Verbalized understanding          PT Long Term Goals - 10/26/17 1439      PT LONG TERM GOAL #1   Title  Patient will demonstrate she has returned to baseline related to shoulder ROM and function since breast surgery.    Time  8    Period  Weeks    Status  New  Breast Clinic Goals - 10/26/17 1439      Patient will be able to verbalize understanding of pertinent lymphedema risk reduction practices relevant to her diagnosis specifically related to skin care.   Time  1    Period  Days     Status  Achieved      Patient will be able to return demonstrate and/or verbalize understanding of the post-op home exercise program related to regaining shoulder range of motion.   Time  1    Period  Days    Status  Achieved      Patient will be able to verbalize understanding of the importance of attending the postoperative After Breast Cancer Class for further lymphedema risk reduction education and therapeutic exercise.   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 She underwent chemotherapy, a left lumpectomy and axillary lymph node dissection (10 nodes removed), and radiation. She has also had left upper extremity lymphedema since 2009 but has not done treatment for that. Her multidisciplinary medical team met prior to her assessments to determine a recommended treatment plan. She is planning to have a left mastectomy followed by anti-estrogen therapy. She will benefit from post op PT to reduce her lymphedema and instruct her with self-care.    History and Personal Factors relevant to plan of care:  Previous left breast cancer 2008; lymphedema in left upper extremity    Clinical Presentation  Stable    Clinical Decision Making  Low    Rehab Potential  Excellent    Clinical Impairments Affecting Rehab Potential  Lymphedema has been present for 10 years without treatment    PT Frequency  -- Eval and 1 f/u visit    PT Treatment/Interventions  ADLs/Self Care Home Management;Therapeutic exercise;Patient/family education    PT Next Visit Plan  Will reassess 3-4 weeks post op    PT Home Exercise Plan  Post op shoulder ROM HEP    Consulted and Agree with Plan of Care  Patient;Family member/caregiver    Family Member Consulted  Husband       Patient will  benefit from skilled therapeutic intervention in order to improve the following deficits and impairments:  Pain, Decreased knowledge of precautions, Impaired UE functional use, Decreased range of motion, Postural dysfunction  Visit Diagnosis: Ductal carcinoma in situ (DCIS) of left breast - Plan: PT plan of care cert/re-cert  Abnormal posture - Plan: PT plan of care cert/re-cert  Lymphedema, not elsewhere classified - Plan: PT plan of care cert/re-cert   Patient will follow up at outpatient cancer rehab 3-4 weeks following surgery.  If the patient requires physical therapy at that time, a specific plan will be dictated and sent to the referring physician for approval. The patient was educated today on appropriate basic range of motion exercises to begin post operatively and the importance of attending the After Breast Cancer class following surgery.  Patient was educated today on lymphedema risk reduction practices as it pertains to recommendations that will benefit the patient immediately following surgery.  She verbalized good understanding.     Problem List Patient Active Problem List   Diagnosis Date Noted  . Ductal carcinoma in situ (DCIS)  of left breast 10/24/2017  . Chest pain with moderate risk of acute coronary syndrome 11/03/2016  . Atrial fibrillation with rapid ventricular response ([place]) 11/02/2016  . Breast cancer ([place]) 11/06/2012    Annia Friendly, PT 10/26/17 3:31 PM   [place], [place], [address] Phone: [phone]   Fax:  [phone]  Name: Linda Foster MRN: [id number] Date of Birth: 12-27-1955

## 2017-10-26 NOTE — Progress Notes (Signed)
Radiation Oncology         [phone] ________________________________  Name: Linda Foster        MRN: [id number]  Date of Service: 10/26/2017 DOB: 03-15-1956  QJ:JHERD, Hal Hope, MD  Jovita Kussmaul, MD     REFERRING PHYSICIAN: Jovita Kussmaul, MD   DIAGNOSIS: The encounter diagnosis was Ductal carcinoma in situ (DCIS) of left breast.   HISTORY OF PRESENT ILLNESS: Linda Foster is a 62 y.o. female seen in the multidisciplinary breast clinic for a new diagnosis of left breast cancer. The patient was noted to have a history of left breast cancer treated with neoadjuvant chemotherapy, followed by lumpectomy and ALND in 2008 for a triple negative left breast cancer. She recently returned to screening examinations with mammography and this revealed calcifications in the left lateral breast in the lower outer quadrant. These were noted on diagnostic imaging to span 7 mm. No ultrasound was performed. A biopsy on 10/20/17 revealed a intermediate DCIS, ER/PR positive. She comes today to discuss options of treatment for her cancer.     PREVIOUS RADIATION THERAPY: Yes   2008: Radiotherapy to the left breast    PAST MEDICAL HISTORY:  Past Medical History:  Diagnosis Date  . Breast cancer ([place])   . Lymphedema 2013  . Osteoarthritis        PAST SURGICAL HISTORY: Past Surgical History:  Procedure Laterality Date  . ABDOMINAL HYSTERECTOMY    . BREAST LUMPECTOMY Left 2008  . CESAREAN SECTION      x 3  . KNEE ARTHROSCOPY Right 2007     FAMILY HISTORY:  Family History  Problem Relation Age of Onset  . Hypertension Mother   . COPD Father   . Cancer Father        Prostate CA  . Cancer Brother        Prostate     SOCIAL HISTORY:  reports that  has never smoked. she has never used smokeless tobacco. She reports that she does not drink alcohol or use drugs. The patient is married and lives in [place].    ALLERGIES: Patient has no known allergies.   MEDICATIONS:  Current  Outpatient Medications  Medication Sig Dispense Refill  . B Complex Vitamins (VITAMIN-B COMPLEX PO) Take 2 tablets by mouth daily.    . cephALEXin (KEFLEX) 500 MG capsule Take 1 capsule (500 mg total) by mouth 3 (three) times daily. 30 capsule 0  . Cholecalciferol (VITAMIN D3) 2000 units TABS Take 2,000 Units by mouth daily.    . furosemide (LASIX) 40 MG tablet Take 1 tablet (40 mg total) by mouth daily. 90 tablet 3  . glucosamine-chondroitin 500-400 MG tablet Take 2 tablets by mouth daily.     . metoprolol tartrate (LOPRESSOR) 25 MG tablet Take 1 tablet (25 mg total) by mouth once as needed (for palpitations/AFib as directed). 30 tablet 2  . Multiple Vitamins-Minerals (MULTIVITAMIN ADULT PO) Take 1 tablet by mouth daily.    . naproxen sodium (ANAPROX) 550 MG tablet Take 550 mg by mouth daily.   0  . potassium chloride (K-DUR) 10 MEQ tablet Take one tablet (10 meq) by mouth once every other day 45 tablet 3  . Red Yeast Rice Extract (RED YEAST RICE PO) Take 2 capsules by mouth daily.      No current facility-administered medications for this encounter.      REVIEW OF SYSTEMS: On review of systems, the patient reports that she is doing well overall. She denies  any chest pain, shortness of breath, cough, fevers, chills, night sweats, unintended weight changes. She denies any bowel or bladder disturbances, and denies abdominal pain, nausea or vomiting. She denies any new musculoskeletal or joint aches or pains. A complete review of systems is obtained and is otherwise negative.     PHYSICAL EXAM:  Wt Readings from Last 3 Encounters:  04/15/17 190 lb (86.2 kg)  01/26/17 206 lb (93.4 kg)  11/02/16 212 lb 6.4 oz (96.3 kg)   Temp Readings from Last 3 Encounters:  11/06/16 98.7 F (37.1 C) (Oral)  11/03/16 98.7 F (37.1 C) (Oral)  11/06/12 98.8 F (37.1 C) (Oral)   BP Readings from Last 3 Encounters:  01/26/17 120/70  11/06/16 116/74  11/03/16 (!) 111/55   Pulse Readings from Last 3  Encounters:  01/26/17 65  11/06/16 64  11/03/16 62     In general this is a well appearing white female in no acute distress. She is alert and oriented x4 and appropriate throughout the examination. HEENT reveals that the patient is normocephalic, atraumatic. EOMs are intact. PERRLA. Skin is intact without any evidence of gross lesions.   ECOG = 0  0 - Asymptomatic (Fully active, able to carry on all predisease activities without restriction)  1 - Symptomatic but completely ambulatory (Restricted in physically strenuous activity but ambulatory and able to carry out work of a light or sedentary nature. For example, light housework, office work)  2 - Symptomatic, <50% in bed during the day (Ambulatory and capable of all self care but unable to carry out any work activities. Up and about more than 50% of waking hours)  3 - Symptomatic, >50% in bed, but not bedbound (Capable of only limited self-care, confined to bed or chair 50% or more of waking hours)  4 - Bedbound (Completely disabled. Cannot carry on any self-care. Totally confined to bed or chair)  5 - Death   Eustace Pen MM, Creech RH, Tormey DC, et al. [phone]). "Toxicity and response criteria of the [facility]". [place] Oncol. 5 (6): 649-55    LABORATORY DATA:  Lab Results  Component Value Date   WBC 6.2 11/03/2016   HGB 13.0 11/03/2016   HCT 39.3 11/03/2016   MCV 86.8 11/03/2016   PLT 176 11/03/2016   Lab Results  Component Value Date   NA 143 02/28/2017   K 3.9 02/28/2017   CL 103 02/28/2017   CO2 25 02/28/2017   Lab Results  Component Value Date   ALT 29 11/03/2016   AST 27 11/03/2016   ALKPHOS 64 11/03/2016   BILITOT 1.3 (H) 11/03/2016      RADIOGRAPHY: No results found.     IMPRESSION/PLAN: 1. 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 2. Possible genetic predisposition to malignancy. The patient is interested in genetic testing and is a candidate for testing.    The patient was seen today in breast clinic and we discussed the recommendation from conference that proceeding with a mastectomy would be standard treatment.  The patient appears somewhat motivated to consider breast conservation treatment.  I discussed with her my opinion that the next most standard option would be lumpectomy with hopefully wide margins, followed by anti-hormonal treatment.  I would tend to forego radiation in this setting, especially given the diagnosis of DCIS and what appears to represent a small area of disease.  If margins were an issue and would otherwise force mastectomy, that may be a time to consider  adjuvant radiation treatment.  Given her current presentation, I really would not favor going into surgery thinking that radiation would be given no matter what pathologic findings result.  The patient is going to think about all of her options and let us know how to proceed.  Tentatively, I do not have any plans to proceed with radiation treatment but certainly would be happy to follow the patient's case and review her upcoming decisions and final pathology report.  The patient was seen today for 45 minutes, with the majority of the time spent counseling the patient on his diagnosis of cancer and coordinating his care.

## 2017-11-01 DIAGNOSIS — Z853 Personal history of malignant neoplasm of breast: Secondary | ICD-10-CM | POA: Diagnosis not present

## 2017-11-01 DIAGNOSIS — Z923 Personal history of irradiation: Secondary | ICD-10-CM | POA: Diagnosis not present

## 2017-11-01 DIAGNOSIS — D0512 Intraductal carcinoma in situ of left breast: Secondary | ICD-10-CM | POA: Diagnosis not present

## 2017-11-03 ENCOUNTER — Telehealth: Payer: Self-pay | Admitting: *Deleted

## 2017-11-03 NOTE — Telephone Encounter (Signed)
Patient wants an appointment with Dr. Donne Hazel as 2nd opinion.  Appt 11/04/17 with Dr. Donne Hazel

## 2017-11-04 ENCOUNTER — Other Ambulatory Visit: Payer: Self-pay | Admitting: General Surgery

## 2017-11-04 DIAGNOSIS — D0512 Intraductal carcinoma in situ of left breast: Secondary | ICD-10-CM

## 2017-11-06 ENCOUNTER — Other Ambulatory Visit: Payer: BLUE CROSS/BLUE SHIELD

## 2017-11-08 ENCOUNTER — Other Ambulatory Visit: Payer: BLUE CROSS/BLUE SHIELD

## 2017-11-11 ENCOUNTER — Other Ambulatory Visit: Payer: BLUE CROSS/BLUE SHIELD

## 2017-11-14 ENCOUNTER — Encounter: Payer: Self-pay | Admitting: Genetics

## 2017-11-14 ENCOUNTER — Inpatient Hospital Stay: Payer: BLUE CROSS/BLUE SHIELD | Attending: Hematology | Admitting: Genetics

## 2017-11-14 ENCOUNTER — Inpatient Hospital Stay: Payer: BLUE CROSS/BLUE SHIELD

## 2017-11-14 DIAGNOSIS — Z7183 Encounter for nonprocreative genetic counseling: Secondary | ICD-10-CM

## 2017-11-14 DIAGNOSIS — Z853 Personal history of malignant neoplasm of breast: Secondary | ICD-10-CM

## 2017-11-14 DIAGNOSIS — Z809 Family history of malignant neoplasm, unspecified: Secondary | ICD-10-CM

## 2017-11-14 DIAGNOSIS — Z801 Family history of malignant neoplasm of trachea, bronchus and lung: Secondary | ICD-10-CM

## 2017-11-14 DIAGNOSIS — Z8042 Family history of malignant neoplasm of prostate: Secondary | ICD-10-CM

## 2017-11-14 DIAGNOSIS — D0512 Intraductal carcinoma in situ of left breast: Secondary | ICD-10-CM | POA: Diagnosis not present

## 2017-11-14 NOTE — Progress Notes (Signed)
REFERRING PROVIDER:  PRIMARY PROVIDER:  Carol Ada, MD  PRIMARY REASON FOR VISIT:  1. Ductal carcinoma in situ (DCIS) of left breast   2. Family history of prostate cancer   3. History of left breast cancer     HISTORY OF PRESENT ILLNESS:   Ms. Ige, a 62 y.o. female, was seen for a [place] cancer genetics consultation at the request of Dr. Tamala Julian due to a personal and family history of cancer.  Ms. Blea presents to clinic today to discuss the possibility of a hereditary predisposition to cancer, genetic testing, and to further clarify her future cancer risks, as well as potential cancer risks for family members.   At the age of 44, Ms. Overholt was diagnosed with triple negative Invasive ductal carcinoma of the left breast.  Recently in Feb 2019, she has been diagnosed with Ductal carcinoma in situ ER+, PR+ at the age of 32. She previously had lumpectomy chemotherapy, and radiation.  For her current diagnosis of DCIS she is contemplating surgical and treatment options at this time.  Her decisions will be based on the results of her genetic testing as well as her upcoming breast MRI.     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     History of left breast cancer   08/25/2006 Imaging    MR breast bilateral IMPRESSION: 4.9 cm mass within the upper outer quadrant of the left breast, consistent with known malignancy. Enlarged lower axillary lymph node on the left, consistent with known metastatic disease.      08/27/2006 Imaging    PET Scan IMPRESSION: The left breast primary and left axillary adenopathy are both hypermetabolic. No other areas  of hypermetabolism suggest metastatic disease. Neither of the two large liver lesions is hypermetabolic.  Further, a left hepatic dome lesion is partially imaged on the breast MR is more consistent with a hemangioma or less likely cyst.  These lesions could be more entirely evaluated with dedicated hepatic MR or reevaluated on follow-up PET.       09/16/2006 - 11/04/2006 Chemotherapy    The patient had 4 cycles of chemotherapy with FEC for chemotherapy treatment.        11/17/2006 Imaging    MR breast bilateral IMPRESSION: No residual enhancement of the left upper outer quadrant breast cancer.  The previously seen left axillary adenopathy has also resolved.      11/18/2006 - 12/02/2006 Chemotherapy    The patient had 2 cycles of chemotherapy with Taxotere for chemotherapy treatment.        12/16/2006 - 01/06/2007 Chemotherapy    The patient had  2 cycles of chemotherapy with Abraxane for chemotherapy treatment.        01/09/2007 Imaging    MR breast bilateral IMPRESSION: No residual enhancement in the upper outer quadrant of the left breast.  Left axillary adenopathy is no longer present.      02/01/2007 Initial Diagnosis    History of left breast cancer      2008 Mammogram    Routine screening mammogram IMPRESSION: Palpable mass 3 cm by lobe with this centimeter focus of calcifications. Ultrasound confirmed the presence of  a hypoechoic mass measuring about 5 cm in maximum diameter. There was a 2 cm hypoechoic lymph nodes seen.       02/01/2007 Pathology Results    LEFT BREAST, NEEDLE LOCALIZATION BIOPSY IMPRESSION: DENSE FIBROSIS. NO RESIDUAL TUMOR IDENTIFIED. LYMPH NODE, LEFT AXILLARY, EXCISION: NO TUMOR IDENTIFIED IN TEN LYMPH NODES, SEE COMMENT      2008 Pathology Results     Biopsy: Both breasts and lymph nodes showed metastatic carcinoma. ER/PR positive, Ki-67 52%, HER-2/neu 1+.        03/28/2007 - 04/21/2007 Radiation Therapy    Radiation Therapy treatment       Ductal carcinoma  in situ (DCIS) of left breast   10/13/2017 Mammogram    Unilateral left diagnostic mammography with tomography and left breast ultrasonography IMPRESSION: The new grouped calcifications in the left breast are suspicious of malignancy.      10/20/2017 Pathology Results    Left needle core biopsy with pathology showing: IMPRESSION: Breast, left, needle core biopsy with ductal carcinoma in situ (DCIS), intermediate nuclear grade, solid and cribriform type with central necrosis and associated microcalcifications. PROGNOSTIC INDICATORS Results: IMMUNOHISTOCHEMICAL AND MORPHOMETRIC ANALYSIS PERFORMED MANUALLY Estrogen Receptor: 95%, POSITIVE, STRONG STAINING INTENSITY Progesterone Receptor: 80%, POSITIVE, STRONG STAINING INTENSITY      10/24/2017 Initial Diagnosis    Ductal carcinoma in situ (DCIS) of left breast        HORMONAL RISK FACTORS:  Menarche was at age 63.  First live birth at age 29.  Ovaries intact: yes. 1 intact, 1 removed in 2007 during hysterectomy Hysterectomy: yes. 2007 Menopausal status: postmenopausal.  Colonoscopy: yes; reportedly normal, and reportedly due to another one soon. Mammogram within the last year: yes.  Past Medical History:  Diagnosis Date  . Breast cancer ([place])   . Family history of prostate cancer   . Lymphedema 2013  . OSA (obstructive sleep apnea)   . Osteoarthritis     Past Surgical History:  Procedure Laterality Date  . ABDOMINAL HYSTERECTOMY    . BREAST LUMPECTOMY Left 2008  . CESAREAN SECTION      x 3  . KNEE ARTHROSCOPY Right 2007    Social History   Socioeconomic History  . Marital status: Married    Spouse name: None  . Number of children: None  . Years of education: None  . Highest education level: None  Social Needs  . Financial resource strain: None  . Food insecurity - worry: None  . Food insecurity - inability: None  . Transportation needs - medical: None  . Transportation needs - non-medical: None  Occupational  History  . None  Tobacco Use  . Smoking status: Never Smoker  . Smokeless tobacco: Never Used  Substance and Sexual Activity  . Alcohol use: No  . Drug use: No  . Sexual activity: None  Other Topics Concern  . None  Social History Narrative  . None     FAMILY HISTORY:  We obtained a detailed, 4-generation family history.  Significant diagnoses are listed below: Family History  Problem Relation Age of Onset  . Hypertension Mother   . COPD Father   . Prostate cancer Father 42  . Lung cancer Brother 57  . Prostate cancer Brother 71  . Cancer Cousin        type unk dx. >50  . Cancer Cousin 58       type unk  . Cancer Cousin        type and age dx unkown   Ms.  Seabolt has 2 daughters and 1 son in their 33's with no history of cancer.  Ms. Lamaster has 7 grandchildren.  Ms. Snooks has a brother who developed prostate cancer at 20 and lung cancer at 31.  He is currently in his 32's receiving treatment for his lung cancer.  He has 2 children.  Ms. Hattery also has a paternal half-brother who is 39 with no history of cancer.   Ms. Sabia' father had prostate cancer at 55, he did not have treatment for this and died at 20 due to COPD.  Ms. Hoheisel has 1 paternal uncle who died in his 25's due to heart disease.  Ms. Lemley has 3 other paternal uncles and 2 paternal aunts who all died in 54old age71 with no history of cancer. Ms. Martorana has several paternal cousins.  2 of these cousins (children of her aunt) had cancer.  She does not know what type, and reports it was probably diagnosed later than age 35 for them.  Ms. Kannan' paternal grandparents died in their 61's with no history of cancer.   Ms. Raspberry' mother is 41 with no history of cancer.   Ms. Spates thinks she had a hysterectomy, but is not sure.  Ms. Papa has 75 maternal aunts/uncles with no history of cancer.  They all died due to age related disease/heart disease.  Ms. Slutsky' has many maternal cousins.  73 female maternal cousin died at 25 due to  cancer dx in her late 71's.  Ms. Dangler reports 5 other maternal cousins had cancer but she does not know at what age and what type. Ms. Cervantez' maternal grandfather died in his 72's with no history of cancer.  Ms. Zertuche' maternal grandmother died I her 53's with no history of cancer.   Ms. Lords is unaware of previous family history of genetic testing for hereditary cancer risks. Patient's maternal ancestors are of Northern European descent, and paternal ancestors are of Northern European descent. There is no reported Ashkenazi Jewish ancestry. There is no known consanguinity.  GENETIC COUNSELING ASSESSMENT: LEALA BRYAND is a 62 y.o. female with a personal and family history which is somewhat suggestive of a Hereditary Cancer Predisposition Syndrome. We, therefore, discussed and recommended the following at today's visit.   DISCUSSION: We reviewed the characteristics, features and inheritance patterns of hereditary cancer syndromes. We also discussed genetic testing, including the appropriate family members to test, the process of testing, insurance coverage and turn-around-time for results. We discussed the implications of a negative, positive and/or variant of uncertain significant result. In order to get genetic test results in a timely manner so that Ms. Schwarzkopf can use these genetic test results for surgical decisions, we recommended Ms. Liao pursue genetic testing for the Breast Cancer STAT Panel.The STAT Breast cancer panel offered by [facility] includes sequencing and rearrangement analysis for the following 9 genes:  ATM, BRCA1, BRCA2, CDH1, CHEK2, PALB2, PTEN, STK11 and TP53.   We then recommend Ms. Math pursue reflex genetic testing to the Common Hereditary Cancers gene panel. The Common Hereditary Cancer Panel offered by [facility] includes sequencing and/or deletion duplication testing of the following 47 genes: APC, ATM, AXIN2, BARD1, BMPR1A, BRCA1, BRCA2, BRIP1, CDH1, CDKN2A (p14ARF), CDKN2A  (p16INK4a), CKD4, CHEK2, CTNNA1, DICER1, EPCAM (Deletion/duplication testing only), GREM1 (promoter region deletion/duplication testing only), KIT, MEN1, MLH1, MSH2, MSH3, MSH6, MUTYH, NBN, NF1, NHTL1, PALB2, PDGFRA, PMS2, POLD1, POLE, PTEN, RAD50, RAD51C, RAD51D, SDHB, SDHC, SDHD, SMAD4, SMARCA4. STK11, TP53, TSC1, TSC2, and VHL.  The  following genes were evaluated for sequence changes only: SDHA and HOXB13 c.251G>A variant only.  We discussed that only 5-10% of cancers are associated with a Hereditary cancer predisposition syndrome.  One of the most common hereditary cancer syndromes that increases breast cancer risk is called Hereditary Breast and Ovarian Cancer (HBOC) syndrome.  This syndrome is caused by mutations in the BRCA1 and BRCA2 genes.  This syndrome increases an individual's lifetime risk to develop breast, ovarian, pancreatic, and other types of cancer.  There are also many other cancer predisposition syndromes caused by mutations in several other genes.  We discussed that if she is found to have a mutation in one of these genes, it may impact surgical decisions, and alter future medical management recommendations such as increased cancer screenings and consideration of risk reducing surgeries.  A positive result could also have implications for the patient's family members.  A Negative result would mean we were unable to identify a hereditary component to her cancer, but does not rule out the possibility of a hereditary basis for her cancer.  There could be mutations that are undetectable by current technology, or in genes not yet tested or identified to increase cancer risk.    We discussed the potential to find a Variant of Uncertain Significance or VUS.  These are variants that have not yet been identified as pathogenic or benign, and it is unknown if this variant is associated with increased cancer risk or if this is a normal finding.  Most VUS's are reclassified to benign or likely  benign.   It should not be used to make medical management decisions. With time, we suspect the lab will determine the significance of any VUS's identified if any.   Based on Ms. Bugarin's personal and family history of cancer, she meets medical criteria for genetic testing. Despite that she meets criteria, she may still have an out of pocket cost. We discussed that if her out of pocket cost for testing is over $100, the laboratory will call and confirm whether she wants to proceed with testing.  If the out of pocket cost of testing is less than $100 she will be billed by the genetic testing laboratory.   PLAN: After considering the risks, benefits, and limitations, Ms. Fruge  provided informed consent to pursue genetic testing and the blood sample was sent to [facility] for analysis of the Breast Cancer STAT panel with plans to reflex to the Common Hereditary Cancers Panel. Preliminary results should be available within approximately 5-12 days' time, at which point they will be disclosed by telephone to Ms. Meidinger, as will any additional recommendations warranted by these results. Ms. Pew will receive a summary of her genetic counseling visit and a copy of her results once available. This information will also be available in Epic. We encouraged Ms. Offutt to remain in contact with cancer genetics annually so that we can continuously update the family history and inform her of any changes in cancer genetics and testing that may be of benefit for her family. Ms. Yokum questions were answered to her satisfaction today. Our contact information was provided should additional questions or concerns arise.  Lastly, we encouraged Ms. Blumer to remain in contact with cancer genetics annually so that we can continuously update the family history and inform her of any changes in cancer genetics and testing that may be of benefit for this family.   Ms.  Bannister questions were answered to her satisfaction  today. Our contact information  was provided should additional questions or concerns arise. Thank you for the referral and allowing [place] to share in the care of your patient.   Tana Felts, MS, [facility] Certified Genetic Counselor [email or web address] phone: [phone]  The patient was seen for a total of 35 minutes in face-to-face genetic counseling.  This patient was discussed with Drs. Magrinat, Lindi Adie and/or Burr Medico who agrees with the above.

## 2017-11-16 ENCOUNTER — Ambulatory Visit
Admission: RE | Admit: 2017-11-16 | Discharge: 2017-11-16 | Disposition: A | Discharge status: Home / Self Care | Payer: BLUE CROSS/BLUE SHIELD | Source: Ambulatory Visit | Attending: General Surgery | Admitting: General Surgery

## 2017-11-16 DIAGNOSIS — D0512 Intraductal carcinoma in situ of left breast: Secondary | ICD-10-CM

## 2017-11-16 DIAGNOSIS — Z853 Personal history of malignant neoplasm of breast: Secondary | ICD-10-CM | POA: Diagnosis not present

## 2017-11-16 MED ORDER — GADOBENATE DIMEGLUMINE 529 MG/ML IV SOLN
18.0000 mL | Freq: Once | INTRAVENOUS | Status: AC | PRN
  Administered 2017-11-16: 18 mL via INTRAVENOUS

## 2017-11-24 ENCOUNTER — Telehealth: Payer: Self-pay | Admitting: Genetics

## 2017-11-24 NOTE — Telephone Encounter (Addendum)
  Revealed preliminary genetic test results were negative/normal.  No mutations identified in the 9 high risk breast cancer genes.  Results from the full panel are still pending, but these are the genes that have potential impact on breast surgical choices and are most informative for a woman's breast surgical decision. I will call when full panel results are available.

## 2017-11-29 ENCOUNTER — Other Ambulatory Visit: Payer: Self-pay

## 2017-11-29 ENCOUNTER — Encounter (HOSPITAL_BASED_OUTPATIENT_CLINIC_OR_DEPARTMENT_OTHER): Payer: Self-pay | Admitting: *Deleted

## 2017-11-29 ENCOUNTER — Other Ambulatory Visit: Payer: Self-pay | Admitting: General Surgery

## 2017-11-29 DIAGNOSIS — D0512 Intraductal carcinoma in situ of left breast: Secondary | ICD-10-CM

## 2017-11-30 ENCOUNTER — Encounter (HOSPITAL_BASED_OUTPATIENT_CLINIC_OR_DEPARTMENT_OTHER)
Admission: RE | Admit: 2017-11-30 | Discharge: 2017-11-30 | Disposition: A | Discharge status: Home / Self Care | Payer: BLUE CROSS/BLUE SHIELD | Source: Ambulatory Visit | Attending: General Surgery | Admitting: General Surgery

## 2017-11-30 DIAGNOSIS — Z01818 Encounter for other preprocedural examination: Secondary | ICD-10-CM | POA: Diagnosis not present

## 2017-11-30 LAB — BASIC METABOLIC PANEL
ANION GAP: 7 (ref 5–15)
BUN: 23 mg/dL — ABNORMAL HIGH (ref 6–20)
CHLORIDE: 105 mmol/L (ref 101–111)
CO2: 29 mmol/L (ref 22–32)
Calcium: 9.4 mg/dL (ref 8.9–10.3)
Creatinine, Ser: 0.74 mg/dL (ref 0.44–1.00)
GFR calc non Af Amer: >60 mL/min (ref >60)
Glucose, Bld: 128 mg/dL — ABNORMAL HIGH (ref 65–99)
POTASSIUM: 4.3 mmol/L (ref 3.5–5.1)
SODIUM: 141 mmol/L (ref 135–145)

## 2017-11-30 NOTE — Progress Notes (Signed)
Ensure pre surgery drink given with instructions to complete by 0830 dos, pt verbalized understanding. 

## 2017-12-01 ENCOUNTER — Encounter: Payer: Self-pay | Admitting: *Deleted

## 2017-12-01 ENCOUNTER — Telehealth: Payer: Self-pay | Admitting: Genetics

## 2017-12-01 DIAGNOSIS — D0512 Intraductal carcinoma in situ of left breast: Secondary | ICD-10-CM

## 2017-12-01 DIAGNOSIS — D0592 Unspecified type of carcinoma in situ of left breast: Secondary | ICD-10-CM | POA: Diagnosis not present

## 2017-12-01 NOTE — Telephone Encounter (Signed)
Revealed negative genetic testing.  Revealed that a VUS's in APC and BRIP1 were identified.   This normal result means we did not find a hereditary cause for Linda Foster's cancer.  This result is reassuring.  However, genetic testing is not perfect, and cannot definitively rule out a hereditary cause.  It will be important for her to keep in contact with genetics to learn if any additional testing may be needed in the future.  Recommended her children and other relatives inform their doctors about the family history of cancer so their doctors can make the most appropriate cancer screening recommendations for them.

## 2017-12-05 ENCOUNTER — Ambulatory Visit (HOSPITAL_BASED_OUTPATIENT_CLINIC_OR_DEPARTMENT_OTHER)
Admission: RE | Admit: 2017-12-05 | Discharge: 2017-12-05 | Disposition: A | Discharge status: Home / Self Care | Payer: BLUE CROSS/BLUE SHIELD | Source: Ambulatory Visit | Attending: General Surgery | Admitting: General Surgery

## 2017-12-05 ENCOUNTER — Ambulatory Visit (HOSPITAL_BASED_OUTPATIENT_CLINIC_OR_DEPARTMENT_OTHER): Payer: BLUE CROSS/BLUE SHIELD | Admitting: Anesthesiology

## 2017-12-05 ENCOUNTER — Other Ambulatory Visit: Payer: Self-pay

## 2017-12-05 ENCOUNTER — Encounter (HOSPITAL_BASED_OUTPATIENT_CLINIC_OR_DEPARTMENT_OTHER)
Admission: RE | Disposition: A | Discharge status: Home / Self Care | Payer: Self-pay | Source: Ambulatory Visit | Attending: General Surgery

## 2017-12-05 ENCOUNTER — Encounter (HOSPITAL_BASED_OUTPATIENT_CLINIC_OR_DEPARTMENT_OTHER): Payer: Self-pay | Admitting: Anesthesiology

## 2017-12-05 DIAGNOSIS — C50512 Malignant neoplasm of lower-outer quadrant of left female breast: Secondary | ICD-10-CM | POA: Insufficient documentation

## 2017-12-05 DIAGNOSIS — Z79899 Other long term (current) drug therapy: Secondary | ICD-10-CM | POA: Diagnosis not present

## 2017-12-05 DIAGNOSIS — Z170 Estrogen receptor positive status [ER+]: Secondary | ICD-10-CM | POA: Diagnosis not present

## 2017-12-05 DIAGNOSIS — D0512 Intraductal carcinoma in situ of left breast: Secondary | ICD-10-CM | POA: Diagnosis not present

## 2017-12-05 DIAGNOSIS — G4733 Obstructive sleep apnea (adult) (pediatric): Secondary | ICD-10-CM | POA: Diagnosis not present

## 2017-12-05 DIAGNOSIS — I4891 Unspecified atrial fibrillation: Secondary | ICD-10-CM | POA: Insufficient documentation

## 2017-12-05 DIAGNOSIS — C50912 Malignant neoplasm of unspecified site of left female breast: Secondary | ICD-10-CM | POA: Diagnosis not present

## 2017-12-05 HISTORY — PX: BREAST LUMPECTOMY WITH RADIOACTIVE SEED LOCALIZATION: SHX6424

## 2017-12-05 HISTORY — DX: Unspecified atrial fibrillation: I48.91

## 2017-12-05 SURGERY — BREAST LUMPECTOMY WITH RADIOACTIVE SEED LOCALIZATION
Anesthesia: General | Site: Breast | Laterality: Left

## 2017-12-05 MED ORDER — CEFAZOLIN SODIUM-DEXTROSE 2-4 GM/100ML-% IV SOLN
INTRAVENOUS | Status: AC
  Filled 2017-12-05: qty 100

## 2017-12-05 MED ORDER — MIDAZOLAM HCL 2 MG/2ML IJ SOLN: 1.0000 mg | INTRAMUSCULAR | Status: DC | PRN

## 2017-12-05 MED ORDER — FENTANYL CITRATE (PF) 100 MCG/2ML IJ SOLN
INTRAMUSCULAR | Status: AC
  Filled 2017-12-05: qty 2

## 2017-12-05 MED ORDER — DEXAMETHASONE SODIUM PHOSPHATE 4 MG/ML IJ SOLN
INTRAMUSCULAR | Status: DC | PRN
  Administered 2017-12-05: 10 mg via INTRAVENOUS

## 2017-12-05 MED ORDER — MIDAZOLAM HCL 5 MG/5ML IJ SOLN
INTRAMUSCULAR | Status: DC | PRN
  Administered 2017-12-05: 2 mg via INTRAVENOUS

## 2017-12-05 MED ORDER — PROPOFOL 10 MG/ML IV BOLUS
INTRAVENOUS | Status: AC
  Filled 2017-12-05: qty 20

## 2017-12-05 MED ORDER — LACTATED RINGERS IV SOLN: INTRAVENOUS | Status: DC

## 2017-12-05 MED ORDER — MEPERIDINE HCL 25 MG/ML IJ SOLN: 6.2500 mg | INTRAMUSCULAR | Status: DC | PRN

## 2017-12-05 MED ORDER — SCOPOLAMINE 1 MG/3DAYS TD PT72: 1.0000 | MEDICATED_PATCH | Freq: Once | TRANSDERMAL | Status: DC | PRN

## 2017-12-05 MED ORDER — BUPIVACAINE HCL (PF) 0.25 % IJ SOLN
INTRAMUSCULAR | Status: DC | PRN
  Administered 2017-12-05: 10 mL

## 2017-12-05 MED ORDER — MIDAZOLAM HCL 2 MG/2ML IJ SOLN
INTRAMUSCULAR | Status: AC
  Filled 2017-12-05: qty 2

## 2017-12-05 MED ORDER — ENSURE PRE-SURGERY PO LIQD: 296.0000 mL | Freq: Once | ORAL | Status: DC

## 2017-12-05 MED ORDER — EPHEDRINE SULFATE 50 MG/ML IJ SOLN
INTRAMUSCULAR | Status: DC | PRN
  Administered 2017-12-05: 25 mg via INTRAVENOUS

## 2017-12-05 MED ORDER — ACETAMINOPHEN 500 MG PO TABS
1000.0000 mg | ORAL_TABLET | ORAL | Status: AC
  Administered 2017-12-05: 1000 mg via ORAL

## 2017-12-05 MED ORDER — FENTANYL CITRATE (PF) 100 MCG/2ML IJ SOLN: 25.0000 ug | INTRAMUSCULAR | Status: DC | PRN

## 2017-12-05 MED ORDER — LIDOCAINE HCL (CARDIAC) 20 MG/ML IV SOLN
INTRAVENOUS | Status: DC | PRN
  Administered 2017-12-05: 30 mg via INTRAVENOUS

## 2017-12-05 MED ORDER — ONDANSETRON HCL 4 MG/2ML IJ SOLN
INTRAMUSCULAR | Status: AC
  Filled 2017-12-05: qty 2

## 2017-12-05 MED ORDER — LACTATED RINGERS IV SOLN
INTRAVENOUS | Status: DC
  Administered 2017-12-05 (×2): via INTRAVENOUS

## 2017-12-05 MED ORDER — FENTANYL CITRATE (PF) 100 MCG/2ML IJ SOLN: 50.0000 ug | INTRAMUSCULAR | Status: DC | PRN

## 2017-12-05 MED ORDER — ONDANSETRON HCL 4 MG/2ML IJ SOLN
INTRAMUSCULAR | Status: DC | PRN
  Administered 2017-12-05: 4 mg via INTRAVENOUS

## 2017-12-05 MED ORDER — FENTANYL CITRATE (PF) 100 MCG/2ML IJ SOLN
INTRAMUSCULAR | Status: DC | PRN
  Administered 2017-12-05: 100 ug via INTRAVENOUS

## 2017-12-05 MED ORDER — DEXAMETHASONE SODIUM PHOSPHATE 10 MG/ML IJ SOLN
INTRAMUSCULAR | Status: AC
  Filled 2017-12-05: qty 1

## 2017-12-05 MED ORDER — PROPOFOL 10 MG/ML IV BOLUS
INTRAVENOUS | Status: DC | PRN
  Administered 2017-12-05: 150 mg via INTRAVENOUS

## 2017-12-05 MED ORDER — CEFAZOLIN SODIUM-DEXTROSE 2-4 GM/100ML-% IV SOLN
2.0000 g | INTRAVENOUS | Status: AC
  Administered 2017-12-05: 2 g via INTRAVENOUS

## 2017-12-05 MED ORDER — IBUPROFEN 200 MG PO TABS: 600.0000 mg | ORAL_TABLET | Freq: Three times a day (TID) | ORAL | 2 refills | Status: DC | PRN

## 2017-12-05 MED ORDER — ACETAMINOPHEN 500 MG PO TABS
ORAL_TABLET | ORAL | Status: AC
  Filled 2017-12-05: qty 2

## 2017-12-05 MED ORDER — LIDOCAINE HCL (CARDIAC) 20 MG/ML IV SOLN
INTRAVENOUS | Status: AC
  Filled 2017-12-05: qty 5

## 2017-12-05 MED ORDER — METOCLOPRAMIDE HCL 5 MG/ML IJ SOLN: 10.0000 mg | Freq: Once | INTRAMUSCULAR | Status: DC | PRN

## 2017-12-05 SURGICAL SUPPLY — 50 items
ADH SKN CLS APL DERMABOND .7 (GAUZE/BANDAGES/DRESSINGS) ×1
BINDER BREAST XLRG (GAUZE/BANDAGES/DRESSINGS) IMPLANT
BINDER BREAST XXLRG (GAUZE/BANDAGES/DRESSINGS) IMPLANT
BLADE SURG 15 STRL LF DISP TIS (BLADE) ×1 IMPLANT
BLADE SURG 15 STRL SS (BLADE) ×2
CANISTER SUCT 1200ML W/VALVE (MISCELLANEOUS) IMPLANT
CHLORAPREP W/TINT 26ML (MISCELLANEOUS) ×2 IMPLANT
CLIP VESOCCLUDE SM WIDE 6/CT (CLIP) ×1 IMPLANT
COVER BACK TABLE 60X90IN (DRAPES) ×2 IMPLANT
COVER MAYO STAND STRL (DRAPES) ×2 IMPLANT
COVER PROBE W GEL 5X96 (DRAPES) ×2 IMPLANT
DERMABOND ADVANCED (GAUZE/BANDAGES/DRESSINGS) ×1
DERMABOND ADVANCED .7 DNX12 (GAUZE/BANDAGES/DRESSINGS) ×1 IMPLANT
DEVICE DUBIN W/COMP PLATE 8390 (MISCELLANEOUS) ×2 IMPLANT
DRAPE LAPAROSCOPIC ABDOMINAL (DRAPES) ×2 IMPLANT
DRAPE UTILITY XL STRL (DRAPES) ×2 IMPLANT
ELECT COATED BLADE 2.86 ST (ELECTRODE) ×2 IMPLANT
ELECT REM PT RETURN 9FT ADLT (ELECTROSURGICAL) ×2
ELECTRODE REM PT RTRN 9FT ADLT (ELECTROSURGICAL) ×1 IMPLANT
GLOVE BIO SURGEON STRL SZ 6.5 (GLOVE) ×1 IMPLANT
GLOVE BIO SURGEON STRL SZ7 (GLOVE) ×4 IMPLANT
GLOVE BIOGEL PI IND STRL 7.0 (GLOVE) IMPLANT
GLOVE BIOGEL PI IND STRL 7.5 (GLOVE) ×1 IMPLANT
GLOVE BIOGEL PI INDICATOR 7.0 (GLOVE) ×1
GLOVE BIOGEL PI INDICATOR 7.5 (GLOVE) ×1
GOWN STRL REUS W/ TWL LRG LVL3 (GOWN DISPOSABLE) ×2 IMPLANT
GOWN STRL REUS W/TWL LRG LVL3 (GOWN DISPOSABLE) ×4
HEMOSTAT ARISTA ABSORB 3G PWDR (MISCELLANEOUS) IMPLANT
KIT MARKER MARGIN INK (KITS) ×2 IMPLANT
NDL HYPO 25X1 1.5 SAFETY (NEEDLE) ×1 IMPLANT
NEEDLE HYPO 25X1 1.5 SAFETY (NEEDLE) ×2 IMPLANT
NS IRRIG 1000ML POUR BTL (IV SOLUTION) ×1 IMPLANT
PACK BASIN DAY SURGERY FS (CUSTOM PROCEDURE TRAY) ×2 IMPLANT
PENCIL BUTTON HOLSTER BLD 10FT (ELECTRODE) ×2 IMPLANT
SLEEVE SCD COMPRESS KNEE MED (MISCELLANEOUS) ×2 IMPLANT
SPONGE LAP 4X18 X RAY DECT (DISPOSABLE) ×2 IMPLANT
STRIP CLOSURE SKIN 1/2X4 (GAUZE/BANDAGES/DRESSINGS) ×2 IMPLANT
SUT MNCRL AB 4-0 PS2 18 (SUTURE) ×2 IMPLANT
SUT MON AB 5-0 PS2 18 (SUTURE) IMPLANT
SUT SILK 2 0 SH (SUTURE) IMPLANT
SUT VIC AB 2-0 SH 27 (SUTURE) ×2
SUT VIC AB 2-0 SH 27XBRD (SUTURE) ×1 IMPLANT
SUT VIC AB 3-0 SH 27 (SUTURE) ×2
SUT VIC AB 3-0 SH 27X BRD (SUTURE) ×1 IMPLANT
SUT VIC AB 5-0 PS2 18 (SUTURE) IMPLANT
SYR CONTROL 10ML LL (SYRINGE) ×2 IMPLANT
TOWEL OR 17X24 6PK STRL BLUE (TOWEL DISPOSABLE) ×2 IMPLANT
TOWEL OR NON WOVEN STRL DISP B (DISPOSABLE) ×2 IMPLANT
TUBE CONNECTING 20X1/4 (TUBING) IMPLANT
YANKAUER SUCT BULB TIP NO VENT (SUCTIONS) IMPLANT

## 2017-12-05 NOTE — Anesthesia Postprocedure Evaluation (Signed)
Anesthesia Post Note  Patient: MIRAKLE TOMLIN  Procedure(s) Performed: BREAST LUMPECTOMY WITH RADIOACTIVE SEED LOCALIZATION (Left Breast)     Patient location during evaluation: PACU Anesthesia Type: General Level of consciousness: awake and alert Pain management: pain level controlled Vital Signs Assessment: post-procedure vital signs reviewed and stable Respiratory status: spontaneous breathing, nonlabored ventilation, respiratory function stable and patient connected to nasal cannula oxygen Cardiovascular status: blood pressure returned to baseline and stable Postop Assessment: no apparent nausea or vomiting Anesthetic complications: no    Last Vitals:  Vitals:   12/05/17 1015 12/05/17 1100  BP: 125/70 129/67  Pulse: 64 71  Resp: (!) 21 18  Temp:  36.8 C  SpO2: 98% 100%    Last Pain:  Vitals:   12/05/17 1100  TempSrc:   PainSc: 0-No pain                 Montez Hageman

## 2017-12-05 NOTE — Anesthesia Preprocedure Evaluation (Signed)
Anesthesia Evaluation  Patient identified by MRN, date of birth, ID band Patient awake    Reviewed: Allergy & Precautions, NPO status , Patient's Chart, lab work & pertinent test results  Airway Mallampati: II  TM Distance: >3 FB Neck ROM: Full    Dental no notable dental hx.    Pulmonary sleep apnea ,    Pulmonary exam normal breath sounds clear to auscultation       Cardiovascular Normal cardiovascular exam+ dysrhythmias Atrial Fibrillation  Rhythm:Regular Rate:Normal     Neuro/Psych negative neurological ROS  negative psych ROS   GI/Hepatic negative GI ROS, Neg liver ROS,   Endo/Other  negative endocrine ROS  Renal/GU negative Renal ROS  negative genitourinary   Musculoskeletal negative musculoskeletal ROS (+)   Abdominal   Peds negative pediatric ROS (+)  Hematology negative hematology ROS (+)   Anesthesia Other Findings   Reproductive/Obstetrics negative OB ROS                             Anesthesia Physical Anesthesia Plan  ASA: II  Anesthesia Plan: General   Post-op Pain Management:    Induction: Intravenous  PONV Risk Score and Plan: 3 and Ondansetron, Dexamethasone, Midazolam and Treatment may vary due to age or medical condition  Airway Management Planned: LMA  Additional Equipment:   Intra-op Plan:   Post-operative Plan: Extubation in OR  Informed Consent: I have reviewed the patients History and Physical, chart, labs and discussed the procedure including the risks, benefits and alternatives for the proposed anesthesia with the patient or authorized representative who has indicated his/her understanding and acceptance.   Dental advisory given  Plan Discussed with: CRNA  Anesthesia Plan Comments:         Anesthesia Quick Evaluation

## 2017-12-05 NOTE — Transfer of Care (Signed)
Immediate Anesthesia Transfer of Care Note  Patient: Linda Foster  Procedure(s) Performed: BREAST LUMPECTOMY WITH RADIOACTIVE SEED LOCALIZATION (Left Breast)  Patient Location: PACU  Anesthesia Type:General  Level of Consciousness: awake and patient cooperative  Airway & Oxygen Therapy: Patient Spontanous Breathing and Patient connected to face mask oxygen  Post-op Assessment: Report given to RN and Post -op Vital signs reviewed and stable  Post vital signs: Reviewed and stable  Last Vitals:  Vitals Value Taken Time  BP    Temp    Pulse    Resp    SpO2      Last Pain:  Vitals:   12/05/17 0807  TempSrc: Oral         Complications: No apparent anesthesia complications

## 2017-12-05 NOTE — H&P (Signed)
Linda Foster is an 62 y.o. female.   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 she then underwent radiotherapy for this. some mild lue swelling that she isnt really treating at all now. she underwent screening mm. she did not have a mass or dc. her density is c. she was noted to have a 7 mm area of calcs in the loq of the left breast. this underwent stereotactic core biopsy that shows intermediate grade dcis that is er pos at 95% and pr pos at 80%. since last visit she has mri that shows only postbiopsy changes and genetics is negative.   Past Medical History:  Diagnosis Date  . A-fib [facility])    one time episode 2018  . Breast cancer ([place]) 2008   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  . Breast cancer ([place]) 2019   left ER/PR +  . Family history of prostate cancer   . Lymphedema 2013  . OSA (obstructive sleep apnea)    does not use CPAP  . Osteoarthritis     Past Surgical History:  Procedure Laterality Date  . ABDOMINAL HYSTERECTOMY    . BREAST LUMPECTOMY Left 2008  . CESAREAN SECTION      x 3  . KNEE ARTHROSCOPY Right 2007    Family History  Problem Relation Age of Onset  . Hypertension Mother   . COPD Father   . Prostate cancer Father 81  . Lung cancer Brother 51  . Prostate cancer Brother 65  . Cancer Cousin        type unk dx. >50  . Cancer Cousin 58       type unk  . Cancer Cousin        type and age dx unkown   Social History:  reports that she has never smoked. She has never used smokeless tobacco. She reports that she does not drink alcohol or use drugs.  Allergies: No Known Allergies  Medications Prior to Admission  Medication Sig Dispense Refill  . B Complex Vitamins (VITAMIN-B COMPLEX PO) Take 2 tablets by mouth daily.    . Cholecalciferol (VITAMIN D3) 2000 units TABS Take 2,000 Units by mouth daily.    .  furosemide (LASIX) 40 MG tablet Take 1 tablet (40 mg total) by mouth daily. 90 tablet 3  . glucosamine-chondroitin 500-400 MG tablet Take 2 tablets by mouth daily.     . naproxen sodium (ANAPROX) 550 MG tablet Take 550 mg by mouth daily.   0  . metoprolol tartrate (LOPRESSOR) 25 MG tablet Take 25 mg by mouth 2 (two) times daily.    . potassium chloride (K-DUR) 10 MEQ tablet Take one tablet (10 meq) by mouth once every other day 45 tablet 3    No results found for this or any previous visit (from the past 48 hour(s)). No results found.  Review of Systems  All other systems reviewed and are negative.   Blood pressure 121/69, pulse 67, temperature 98.2 F (36.8 C), temperature source Oral, resp. rate 18, height 5\' 6"  (1.676 m), weight 88 kg (194 lb), SpO2 100 %. Physical Exam  Physical Exam Rolm Bookbinder MD; 11/04/2017 3:28 PM) General Mental Status-Alert. Orientation-Oriented X3. Head and Neck Trachea-midline. Eye Sclera/Conjunctiva - Bilateral-No scleral icterus. Chest and Lung Exam Chest and lung exam reveals -quiet, even and easy respiratory effort with no use of  accessory muscles and on auscultation, normal breath sounds, no adventitious sounds and normal vocal resonance. Breast Nipples-No Discharge. Breast Lump-No Palpable Breast Mass. Healed incisions Cardiovascular Cardiovascular examination reveals -normal heart sounds, regular rate and rhythm with no murmurs and no digital clubbing, cyanosis, edema, increased warmth or tenderness. Lymphatic Head & Neck General Head & Neck Lymphatics: Bilateral - Description - Normal. Axillary General Axillary Region: Bilateral - Description - Normal. Note: no [place] adenopathy  Assessment/Plan DUCTAL CARCINOMA IN SITU (DCIS) OF LEFT BREAST (D05.12) Story: Left breast seed guided lumpectomy she has a new left breast cancer after being treated with lump/radiation for a tnbc over a decade ago. we discussed the  recommendation from me as well as guidelines is for mastectomy after completing successful bct in the past. we discussed a total mastectomy (would forgo attempt at nodal eval given alnd and dcis). this could either be standard mastectomy or consider nsm. she has seen plastic surgery and discussed options. I think reasonable to place expander at time of surgery. we discussed later flap/implant or diep flap. if she is interested in diep flap would discuss sending her to [facility] as she may benefit from combo diep flap and nodal surgery due to longstanding lymphedema. she is considering these options. they are both very interested in lumpectomy also. they understand this is not first line or standard. it would also lead to likely increased local recurrence and this could be invasive. although this is int grade it is not this dcis but the remainder of breast that is concerning. if we are going to consider anything but mastectomy.with negative mri she is very interested in trying lumpectomy. she understands this is not first recomendation and that she has higher local recurrence rate. she would like to do lumpectomy and then antiestrogen. I dont think this is completely unreasonable so we will proceed.   Rolm Bookbinder, MD 12/05/2017, 8:38 AM

## 2017-12-05 NOTE — Interval H&P Note (Signed)
History and Physical Interval Note:  12/05/2017 8:41 AM  Linda Foster  has presented today for surgery, with the diagnosis of LEFT BREAST CANCER  The various methods of treatment have been discussed with the patient and family. After consideration of risks, benefits and other options for treatment, the patient has consented to  Procedure(s): BREAST LUMPECTOMY WITH RADIOACTIVE SEED LOCALIZATION (Left) as a surgical intervention .  The patient's history has been reviewed, patient examined, no change in status, stable for surgery.  I have reviewed the patient's chart and labs.  Questions were answered to the patient's satisfaction.     Rolm Bookbinder

## 2017-12-05 NOTE — Anesthesia Procedure Notes (Signed)
Procedure Name: LMA Insertion Date/Time: 12/05/2017 8:56 AM Performed by: Marrianne Mood, CRNA Pre-anesthesia Checklist: Patient identified, Emergency Drugs available, Suction available, Patient being monitored and Timeout performed Patient Re-evaluated:Patient Re-evaluated prior to induction Oxygen Delivery Method: Circle system utilized Preoxygenation: Pre-oxygenation with 100% oxygen Induction Type: IV induction Ventilation: Mask ventilation without difficulty LMA: LMA inserted LMA Size: 4.0 Number of attempts: 1 Airway Equipment and Method: Bite block Placement Confirmation: positive ETCO2 Tube secured with: Tape Dental Injury: Teeth and Oropharynx as per pre-operative assessment

## 2017-12-05 NOTE — Discharge Instructions (Signed)
[facility] Office Phone Number [phone]  POST OP INSTRUCTIONS  Always review your discharge instruction sheet given to you by the facility where your surgery was performed.  IF YOU HAVE DISABILITY OR FAMILY LEAVE FORMS, YOU MUST BRING THEM TO THE OFFICE FOR PROCESSING.  DO NOT GIVE THEM TO YOUR DOCTOR.  1. A prescription for pain medication may be given to you upon discharge.  Take your pain medication as prescribed, if needed.  If narcotic pain medicine is not needed, then you may take acetaminophen (Tylenol), naprosyn (Alleve) or ibuprofen (Advil) as needed. Next Dose of Tylenol at 2:30pm if needed. 2. Take your usually prescribed medications unless otherwise directed 3. If you need a refill on your pain medication, please contact your pharmacy.  They will contact our office to request authorization.  Prescriptions will not be filled after 5pm or on week-ends. 4. You should eat very light the first 24 hours after surgery, such as soup, crackers, pudding, etc.  Resume your normal diet the day after surgery. 5. Most patients will experience some swelling and bruising in the breast.  Ice packs and a good support bra will help.  Wear the breast binder provided or a sports bra for 72 hours day and night.  After that wear a sports bra during the day until you return to the office. Swelling and bruising can take several days to resolve.  6. It is common to experience some constipation if taking pain medication after surgery.  Increasing fluid intake and taking a stool softener will usually help or prevent this problem from occurring.  A mild laxative (Milk of Magnesia or Miralax) should be taken according to package directions if there are no bowel movements after 48 hours. 7. Unless discharge instructions indicate otherwise, you may remove your bandages 48 hours after surgery and you may shower at that time.  You may have steri-strips (small skin tapes) in place directly over the  incision.  These strips should be left on the skin for 7-10 days and will come off on their own.  If your surgeon used skin glue on the incision, you may shower in 24 hours.  The glue will flake off over the next 2-3 weeks.  Any sutures or staples will be removed at the office during your follow-up visit. 8. ACTIVITIES:  You may resume regular daily activities (gradually increasing) beginning the next day.  Wearing a good support bra or sports bra minimizes pain and swelling.  You may have sexual intercourse when it is comfortable. a. You may drive when you no longer are taking prescription pain medication, you can comfortably wear a seatbelt, and you can safely maneuver your car and apply brakes. b. RETURN TO WORK:  ______________________________________________________________________________________ 9. You should see your doctor in the office for a follow-up appointment approximately two weeks after your surgery.  Your doctors nurse will typically make your follow-up appointment when she calls you with your pathology report.  Expect your pathology report 3-4 business days after your surgery.  You may call to check if you do not hear from [place] after three days. 10. OTHER INSTRUCTIONS: _______________________________________________________________________________________________ _____________________________________________________________________________________________________________________________________ _____________________________________________________________________________________________________________________________________ _____________________________________________________________________________________________________________________________________  WHEN TO CALL DR WAKEFIELD: 1. Fever over 101.0 2. Nausea and/or vomiting. 3. Extreme swelling or bruising. 4. Continued bleeding from incision. 5. Increased pain, redness, or drainage from the incision.  The clinic staff is  available to answer your questions during regular business hours.  Please dont hesitate to call and ask to speak to one  of the nurses for clinical concerns.  If you have a medical emergency, go to the nearest emergency room or call 911.  A surgeon from [facility] is always on call at the hospital.  For further questions, please visit [email or web address]   Post Anesthesia Home Care Instructions  Activity: Get plenty of rest for the remainder of the day. A responsible individual must stay with you for 24 hours following the procedure.  For the next 24 hours, DO NOT: -Drive a car -Paediatric nurse -Drink alcoholic beverages -Take any medication unless instructed by your physician -Make any legal decisions or sign important papers.  Meals: Start with liquid foods such as gelatin or soup. Progress to regular foods as tolerated. Avoid greasy, spicy, heavy foods. If nausea and/or vomiting occur, drink only clear liquids until the nausea and/or vomiting subsides. Call your physician if vomiting continues.  Special Instructions/Symptoms: Your throat may feel dry or sore from the anesthesia or the breathing tube placed in your throat during surgery. If this causes discomfort, gargle with warm salt water. The discomfort should disappear within 24 hours.  If you had a scopolamine patch placed behind your ear for the management of post- operative nausea and/or vomiting:  1. The medication in the patch is effective for 72 hours, after which it should be removed.  Wrap patch in a tissue and discard in the trash. Wash hands thoroughly with soap and water. 2. You may remove the patch earlier than 72 hours if you experience unpleasant side effects which may include dry mouth, dizziness or visual disturbances. 3. Avoid touching the patch. Wash your hands with soap and water after contact with the patch.

## 2017-12-05 NOTE — Op Note (Signed)
Preoperative diagnosis: Left breast cancer, clinical stage 0. Prior history left breast tnbc treated with bct Postoperative diagnosis: same as above Procedure:Leftbreast seed guided lumpectomy Surgeon: Dr Serita Grammes TOI:[id number] Anes: general  Specimens: leftbreast tissue marked with paint Complications none Drains none Sponge count correct Dispo to pacu stable  Indications: This is an 36 yof with a prior left breast tn cancer treated with lumpectomy/alnd/radiotherapy/chemotherapy.  She has 7 mm calcifications on her mm. 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 We discussed options and have elected to proceed with lumpectomy.She understands that mastectomy is standard for this disease and recurrence rate is higher with lumpectomy.  she understands she will need antiestrogen postop as well. She had radioactive seed placed prior to beginning and the mammograms were available for review.  Procedure: After informed consent was obtained the patient was taken to the operating room. She was given antibiotics. Sequential compression devices were on her legs. She was then placed under general anesthesia with an LMA. Then she was prepped and draped in the standard sterile surgical fashion. Surgical timeout was then performed.  I then located the seed in thelower outer leftbreastI infiltrated marcaine in the skin and then made a curvilinear incision in the loq.  The seed was very close to the skin so I wanted to make sure I remove all of this and did not hide the scar.I then used the neoprobe to remove the seed and the surrounding tissue with attempt to get clear margins. I marked this with paint. MM confirmed removal of seed,clip and calcifications.I then obtained hemostasis. I placed a clip in this area. This was marked as above.I closed with with 2-0 vicryl to approximate breast tissue. The skin was closed with 3-0 vicryl and 4-0 monocryl. Glue and steristrips were  placed.

## 2017-12-06 ENCOUNTER — Encounter (HOSPITAL_BASED_OUTPATIENT_CLINIC_OR_DEPARTMENT_OTHER): Payer: Self-pay | Admitting: General Surgery

## 2017-12-07 ENCOUNTER — Other Ambulatory Visit: Payer: BLUE CROSS/BLUE SHIELD

## 2017-12-07 ENCOUNTER — Encounter: Payer: BLUE CROSS/BLUE SHIELD | Admitting: Genetic Counselor

## 2017-12-14 ENCOUNTER — Inpatient Hospital Stay: Payer: BLUE CROSS/BLUE SHIELD | Attending: Hematology | Admitting: Oncology

## 2017-12-14 ENCOUNTER — Encounter: Payer: Self-pay | Admitting: Oncology

## 2017-12-14 ENCOUNTER — Telehealth: Payer: Self-pay | Admitting: *Deleted

## 2017-12-14 VITALS — BP 122/61 | HR 78 | Temp 98.5°F | Resp 17 | Ht 66.0 in | Wt 193.4 lb

## 2017-12-14 DIAGNOSIS — Z170 Estrogen receptor positive status [ER+]: Secondary | ICD-10-CM | POA: Diagnosis not present

## 2017-12-14 DIAGNOSIS — I4891 Unspecified atrial fibrillation: Secondary | ICD-10-CM

## 2017-12-14 DIAGNOSIS — D0512 Intraductal carcinoma in situ of left breast: Secondary | ICD-10-CM

## 2017-12-14 DIAGNOSIS — C50512 Malignant neoplasm of lower-outer quadrant of left female breast: Secondary | ICD-10-CM

## 2017-12-14 NOTE — Telephone Encounter (Signed)
Spoke with patient and confirmed appointment for 12/14/17 with Dr. Jana Hakim.

## 2017-12-14 NOTE — Progress Notes (Signed)
[place]  [phone] Fax:([phone]     ID: Linda Foster DOB: [birth year]  MR#: [id number]  SJG#:[id number]  Patient Care Team: Carol Ada, MD as PCP - General (Family Medicine) Takila Compton, MD as Consulting Physician (Obstetrics and Gynecology) Jovita Kussmaul, MD as Consulting Physician (General Surgery) Kyung Rudd, MD as Consulting Physician (Radiation Oncology) Truitt Merle, MD as Consulting Physician (Hematology) Marlou Sa Tonna Corner, MD as Consulting Physician (Orthopedic Surgery) OTHER MD:   CHIEF COMPLAINT: estrogen receptor positive breast cancer  CURRENT TREATMENT: awaiting definitive surgery   HISTORY OF CURRENT ILLNESS: Linda Foster had routine screening mammography on 10/03/2017 showing a possible abnormality in the left breast. She underwent unilateral left breast diagnostic mammography with tomography at [facility] on 10/13/2017 showing: breast density category C. There are new grouped calcifications in the left breast lower outer quadrant and middle depth. Ultrasound of the left axilla on 10/20/2017 showed no abnormalities.   Accordingly on 10/20/2017 she proceeded to biopsy of the left breast area in question. The pathology from this procedure showed ([id number]): Ductal carcinoma in situ, intermediate grade. The largest focus measures 4 mm. The carcinoma is cribriform type with necrosis and associated microcalcifications. Prognostic indicators significant for: estrogen receptor, 95% positive and progesterone receptor, 80% positive, both with strong staining intensity.  Accordingly on 12/05/2017 she proceeded to left lumpectomy.  The pathology [id number]) showed invasive ductal carcinoma, grade 1, measuring 0.3 cm, in addition to low-grade ductal carcinoma in situ.  Both the invasive and in situ components were focally less than a millimeter from the inferior margin.  Note that Linda Foster has a history of prior breast cancer, status post  left lumpectomy for what seems to have been a T2 N3 invasive ductal carcinoma, estrogen and progesterone receptor negative.  She was treated with chemotherapy followed by radiation.  Accordingly the patient is not a candidate for repeat radiation and the feasibility or value of sentinel lymph node sampling is questionable.  Linda Foster has been advised by Dr. Donne Hazel that mastectomy is standard in this situation and she has met with Dr. Iran Planas to consider reconstruction options.  However the patient is interested in exploring the possibility of lumpectomy.  Her subsequent history is as detailed below.  INTERVAL HISTORY: Linda Foster was evaluated in the breast cancer clinic on 12/14/2017 accompanied by her husband. Her case was also presented at the multidisciplinary breast cancer conference on the same day at that time a preliminary plan was proposed: Mastectomy, and if the invasive tumor is estrogen receptor positive, consideration of antiestrogen therapy.  If the patient refused mastectomy, she would at the very least need to have further surgery to improve the inferior margin, and we would also recommend intensified screening with yearly mammogram and yearly breast MRI indefinitely.  We would also recommend antiestrogens   REVIEW OF SYSTEMS: Linda Foster reports that her previous tumor was about 4 cm and 10 lymph nodes were involved. She received "several months" of chemotherapy followed by radiation. Currently, she had routine mammography, and she did not have any symptoms leading up to her mammogram. She enjoys spending time with her grandchildren. For exercise, she tries to walk. She occasionally goes to [employer] with her husband. She used to enjoy running, until she had knee surgery. She also has a treadmill at home. She denies unusual headaches, visual changes, nausea, vomiting, or dizziness. There has been no unusual cough, phlegm production, or pleurisy. This been no change in bowel or bladder habits.  She denies unexplained  fatigue or unexplained weight loss, bleeding, rash, or fever. A detailed review of systems was otherwise stable.    PAST MEDICAL HISTORY: Past Medical History:  Diagnosis Date  . A-fib [facility])    one time episode 2018  . 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 Breast cancer ([place]) 2019   left ER/PR +  . Family history of prostate cancer   . Lymphedema 2013  . OSA (obstructive sleep apnea)    does not use CPAP  . Osteoarthritis     PAST SURGICAL HISTORY: Past Surgical History:  Procedure Laterality Date  . ABDOMINAL HYSTERECTOMY    . BREAST LUMPECTOMY Left 2008  . BREAST LUMPECTOMY WITH RADIOACTIVE SEED LOCALIZATION Left 12/05/2017   Procedure: BREAST LUMPECTOMY WITH RADIOACTIVE SEED LOCALIZATION;  Surgeon: Rolm Bookbinder, MD;  Location: [place];  Service: General;  Laterality: Left;  . CESAREAN SECTION      x 3  . KNEE ARTHROSCOPY Right 2007  She had an episode of  A Fib in 2018. She was being seen by Dr. Oran Rein about 10 years ago. Hysterectomy with USO  FAMILY HISTORY Family History  Problem Relation Age of Onset  . Hypertension Mother   . COPD Father   . Prostate cancer Father 93  . Lung cancer Brother 27  . Prostate cancer Brother 53  . Cancer Cousin        type unk dx. >50  . Cancer Cousin 58       type unk  . Cancer Cousin        type and age dx unkown  Very detailed family history is available in the genetics counseling note from 11/14/2017.  The patient has been genetically tested and no pathogenic mutation was identified (see below).  GYNECOLOGIC HISTORY:  Menarche: 62 years old Age at first live birth: 62 years old The patient is GXP3. She is s/p hysterectomy and USO in 2007. She did not receive HRT. She took oral contraceptives for more than a year remotely with no complications.   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 Konrad Dolores, her husband of 46 years, owns [employer]. The patient's daughter, Loma Sousa, is an Psychologist, prison and probation services at [employer]. The patient's daughter, Jerene Pitch, is a Probation officer with her own business. The patient's son, Lurena Joiner, works with Konrad Dolores at [employer], but he also has a degree in Therapist, sports. The patient has 7 grandchildren. She attends [employer].     ADVANCED DIRECTIVES:    HEALTH MAINTENANCE: Social History   Tobacco Use  . Smoking status: Never Smoker  . Smokeless tobacco: Never Used  Substance Use Topics  . Alcohol use: No  . Drug use: No     Colonoscopy: 10 years ago/ [facility]   PAP:  Bone density: January 2012/ normal    No Known Allergies  Current Outpatient Medications  Medication Sig Dispense Refill  . B Complex Vitamins (VITAMIN-B COMPLEX PO) Take 2 tablets by mouth daily.    . Cholecalciferol (VITAMIN D3) 2000 units TABS Take 2,000 Units by mouth daily.    Marland Kitchen glucosamine-chondroitin 500-400 MG tablet Take 2 tablets by mouth daily.     . potassium chloride (K-DUR) 10 MEQ tablet Take one tablet (10 meq) by mouth once every other day 45 tablet 3  . furosemide (LASIX) 40 MG tablet Take 1 tablet (40 mg total) by mouth daily. 90 tablet 3  No current facility-administered medications for this visit.     OBJECTIVE: Middle-aged white Foster who appears well  Vitals:   12/14/17 1601  BP: 122/61  Pulse: 78  Resp: 17  Temp: 98.5 F (36.9 C)  SpO2: 100%     Body mass index is 31.22 kg/m.   Wt Readings from Last 3 Encounters:  12/14/17 193 lb 6.4 oz (87.7 kg)  12/05/17 194 lb (88 kg)  10/26/17 192 lb 3.2 oz (87.2 kg)      ECOG FS:0 - Asymptomatic  Ocular: Sclerae unicteric, pupils round and equal Ear-nose-throat: Oropharynx clear and moist Lymphatic: No cervical or supraclavicular adenopathy Lungs no rales or rhonchi Heart regular rate and rhythm Abd soft, nontender, positive bowel sounds MSK no focal spinal tenderness, no joint edema Neuro: non-focal,  well-oriented, appropriate affect Breasts: The right breast is unremarkable.  The left breast is status post remote lumpectomy and radiation and recent biopsy.  There is a minimal ecchymosis.  I do not palpate a mass.  Both axillae are benign.   LAB RESULTS:  CMP     Component Value Date/Time   NA 141 11/30/2017 0726   NA 143 02/28/2017 0937   K 4.3 11/30/2017 0726   CL 105 11/30/2017 0726   CO2 29 11/30/2017 0726   GLUCOSE 128 (H) 11/30/2017 0726   BUN 23 (H) 11/30/2017 0726   BUN 14 02/28/2017 0937   CREATININE 0.74 11/30/2017 0726   CREATININE 0.82 10/26/2017 1218   CALCIUM 9.4 11/30/2017 0726   PROT 7.5 10/26/2017 1218   ALBUMIN 4.5 10/26/2017 1218   AST 18 10/26/2017 1218   ALT 15 10/26/2017 1218   ALKPHOS 84 10/26/2017 1218   BILITOT 0.9 10/26/2017 1218   GFRNONAA >60 11/30/2017 0726   GFRNONAA >60 10/26/2017 1218   GFRAA >60 11/30/2017 0726   GFRAA >60 10/26/2017 1218    No results found for: TOTALPROTELP, ALBUMINELP, A1GS, A2GS, BETS, BETA2SER, GAMS, MSPIKE, SPEI  No results found for: KPAFRELGTCHN, LAMBDASER, [facility]  Lab Results  Component Value Date   WBC 5.8 10/26/2017   NEUTROABS 2.8 10/26/2017   HGB 13.0 11/03/2016   HCT 41.0 10/26/2017   MCV 89.5 10/26/2017   PLT 198 10/26/2017    @LASTCHEMISTRY @  Lab Results  Component Value Date   LABCA2 30 09/16/2011    No components found for: RJJOAC166  No results for input(s): INR in the last 168 hours.  Lab Results  Component Value Date   LABCA2 30 09/16/2011    No results found for: AYT016  No results found for: WFU932  No results found for: TFT732  No results found for: CA2729  No components found for: HGQUANT  No results found for: CEA1 / No results found for: CEA1   No results found for: AFPTUMOR  No results found for: [place]  No results found for: PSA1  No visits with results within 3 Day(s) from this visit.  Latest known visit with results is:  Admission on  12/05/2017, Discharged on 12/05/2017  Component Date Value Ref Range Status  . Sodium 11/30/2017 141  135 - 145 mmol/L Final  . Potassium 11/30/2017 4.3  3.5 - 5.1 mmol/L Final  . Chloride 11/30/2017 105  101 - 111 mmol/L Final  . CO2 11/30/2017 29  22 - 32 mmol/L Final  . Glucose, Bld 11/30/2017 128* 65 - 99 mg/dL Final  . BUN 11/30/2017 23* 6 - 20 mg/dL Final  . Creatinine, Ser 11/30/2017 0.74  0.44 - 1.00 mg/dL Final  .  Calcium 11/30/2017 9.4  8.9 - 10.3 mg/dL Final  . GFR calc non Af Amer 11/30/2017 >60  >60 mL/min Final  . GFR calc Af Amer 11/30/2017 >60  >60 mL/min Final   Comment: (NOTE) The eGFR has been calculated using the CKD EPI equation. This calculation has not been validated in all clinical situations. eGFR's persistently <60 mL/min signify possible Chronic Kidney Disease.   Georgiann Hahn gap 11/30/2017 7  5 - 15 Final   Performed at [facility], [place] [address], [place], [place] [address]    (this displays the last labs from the last 3 days)  No results found for: TOTALPROTELP, ALBUMINELP, A1GS, A2GS, BETS, BETA2SER, GAMS, MSPIKE, SPEI (this displays SPEP labs)  No results found for: KPAFRELGTCHN, LAMBDASER, KAPLAMBRATIO (kappa/lambda light chains)  No results found for: HGBA, HGBA2QUANT, HGBFQUANT, HGBSQUAN (Hemoglobinopathy evaluation)   Lab Results  Component Value Date   LDH 162 09/02/2010    No results found for: IRON, TIBC, IRONPCTSAT (Iron and TIBC)  No results found for: FERRITIN  Urinalysis    Component Value Date/Time   LABSPEC 1.020 10/30/2008 1837   PHURINE 7.5 10/30/2008 1837   GLUCOSEU NEGATIVE 10/30/2008 1837   HGBUR LARGE (A) 10/30/2008 [place] 10/30/2008 [place] 10/30/2008 1837   PROTEINUR 100 (A) 10/30/2008 1837   UROBILINOGEN 0.2 10/30/2008 1837   NITRITE NEGATIVE 10/30/2008 1837   LEUKOCYTESUR (A) 10/30/2008 1837    MODERATE Biochemical Testing Only. Please order routine urinalysis from  main lab if confirmatory testing is needed.     STUDIES: Mr Breast Bilateral W Wo Contrast Inc Cad  Result Date: 11/17/2017 CLINICAL DATA:  History of left breast cancer in 2007. Calcifications biopsied in the outer lower left breast represent DCIS in February 2019. LABS:  None available EXAM: BILATERAL BREAST MRI WITH AND WITHOUT CONTRAST TECHNIQUE: Multiplanar, multisequence MR images of both breasts were obtained prior to and following the intravenous administration of 18 ml of MultiHance. THREE-DIMENSIONAL MR IMAGE RENDERING ON INDEPENDENT WORKSTATION: Three-dimensional MR images were rendered by post-processing of the original MR data on an independent workstation. The three-dimensional MR images were interpreted, and findings are reported in the following complete MRI report for this study. Three dimensional images were evaluated at the independent DynaCad workstation COMPARISON:  Previous exam(s). FINDINGS: Breast composition: c. Heterogeneous fibroglandular tissue. Background parenchymal enhancement: Mild Right breast: No mass or abnormal enhancement. Left breast: There is enhancement at the biopsy site in the lower outer left breast spanning 2.2 cm. Most of this enhancement is likely post biopsy in nature. Lymph nodes: No abnormal appearing lymph nodes. Ancillary findings:  None. IMPRESSION: There is enhancement spanning 2.2 cm at the biopsy site in the lower outer left breast. Most of this enhancement is likely post biopsy in nature. No other sites of malignancy in either breast. RECOMMENDATION: Recommend continued surgical consultation with excision of the known DCIS. BI-RADS CATEGORY  6: Known biopsy-proven malignancy. Electronically Signed   By: Dorise Bullion III M.D   On: 11/17/2017 11:19    ELIGIBLE FOR AVAILABLE RESEARCH PROTOCOL: no  ASSESSMENT: 62 y.o. Linda Foster  (1) status post left lumpectomy and axillary lymph node dissection in 2008 for a T2N3 invasive ductal carcinoma,  triple negative, pathology and treatment details not available at present--being retrieved  (a) status post adjuvant chemotherapy  (b) status post radiation  (2) status post left lumpectomy 12/05/2017 for a pT1a cN0, stage IA invasive ductal carcinoma, prognostic panel  pending  (a) pathology also showed ductal carcinoma in situ, estrogen and progesterone receptor strongly positive  (b) both invasive and noninvasive components are focally less than 1 mm from the inferior margin  (3) genetics testing 11/14/2017 through the STAT Breast cancer panel offered by [facility] found no deleterious mutations in ATM, BRCA1, BRCA2, CDH1, CHEK2, PALB2, PTEN, STK11 and TP53.   (a) reflex genetic testing through the Common Hereditary Cancer Panel offered by [facility] additionally found no deleterious mutations in APC, ATM, AXIN2, BARD1, BMPR1A, BRCA1, BRCA2, BRIP1, CDH1, CDKN2A (p14ARF), CDKN2A (p16INK4a), CKD4, CHEK2, CTNNA1, DICER1, EPCAM (Deletion/duplication testing only), GREM1 (promoter region deletion/duplication testing only), KIT, MEN1, MLH1, MSH2, MSH3, MSH6, MUTYH, NBN, NF1, NHTL1, PALB2, PDGFRA, PMS2, POLD1, POLE, PTEN, RAD50, RAD51C, RAD51D, SDHB, SDHC, SDHD, SMAD4, SMARCA4. STK11, TP53, TSC1, TSC2, and VHL.  The following genes were evaluated for sequence changes only: SDHA and HOXB13 c.251G>A variant only.  (c) variants of uncertain significance and APC and BR IP 1 were identified  PLAN: We spent the better part of today's hour-long appointment discussing the biology of her diagnosis and the specifics of her situation.  Linda Foster understands that the standard of care for local recurrence in an irradiated breast is mastectomy.  If she does have mastectomy she is pretty much done with this cancer.  The chance of cure of noninvasive breast cancer with mastectomy approaches 100%.  Her invasive component is less than 5 mm, and in such a small, low-grade tumors the possibility of systemic spread is less than 5%.  If  the invasive tumor is estrogen receptor positive she may further reduce that very low risk of recurrence by taking antiestrogens.  Alternatively, if it is estrogen receptor negative, she could consider antiestrogens for prophylaxis of developing a new cancer in the contralateral breast, that risk being approximately 1/2 %/year  If Linda Foster has a mastectomy she is strongly considering reconstruction.  She is attracted by the idea of nipple sparing mastectomy with implant reconstruction in particular.  She did meet with 1 of our plastic surgeons but is requesting a second opinion and they would like [place] to facilitate that, which I am glad to do.  She has not completely given out the possibility of lumpectomy.  She understands that she would be at higher risk of recurrence because she cannot receive radiation.  If the invasive tumor were estrogen receptor negative (we should know within the next 48 hours) I would strongly discourage this option.  If the tumor is estrogen receptor positive, it becomes a little bit more feasible, but I would still recommend intensified screening with yearly breast MRI alternating with yearly breast mammography, 6 months apart, indefinitely.  She understands that even if she did decide for breast conservation she would need further surgery for margin clearance.  At this point Linda Foster is not yet ready to make a definitive decision.  She is hoping after meeting with a second plastic surgeon she will be able to decide.  That is accordingly her next step  Tentatively I have scheduled her to return to see me in about 2 months, by which time she should be done with whatever surgery she chose and should be able to make a decision regarding antiestrogens.  In preparation for that meeting I gave her some written information on possible side effects, toxicities and complications of the available agents  She will call me with any questions or concerns that may develop before her next  visit.   Magrinat, Virgie Dad, MD  12/14/17 4:41 PM Medical Oncology and Hematology [facility] [address] [place], [place] [address] Tel. [phone]    Fax. [phone]  This document serves as a record of services personally performed by Lurline Del, MD. It was created on his behalf by Sheron Nightingale, a trained medical scribe. The creation of this record is based on the scribe's personal observations and the provider's statements to them.   I have reviewed the above documentation for accuracy and completeness, and I agree with the above.

## 2017-12-15 ENCOUNTER — Telehealth: Payer: Self-pay | Admitting: *Deleted

## 2017-12-15 ENCOUNTER — Ambulatory Visit: Payer: Self-pay | Admitting: Genetics

## 2017-12-15 ENCOUNTER — Telehealth: Payer: Self-pay | Admitting: Oncology

## 2017-12-15 ENCOUNTER — Encounter: Payer: Self-pay | Admitting: Genetics

## 2017-12-15 DIAGNOSIS — C50512 Malignant neoplasm of lower-outer quadrant of left female breast: Secondary | ICD-10-CM

## 2017-12-15 DIAGNOSIS — Z853 Personal history of malignant neoplasm of breast: Secondary | ICD-10-CM

## 2017-12-15 DIAGNOSIS — Z8042 Family history of malignant neoplasm of prostate: Secondary | ICD-10-CM

## 2017-12-15 DIAGNOSIS — Z1379 Encounter for other screening for genetic and chromosomal anomalies: Secondary | ICD-10-CM | POA: Insufficient documentation

## 2017-12-15 DIAGNOSIS — D0512 Intraductal carcinoma in situ of left breast: Secondary | ICD-10-CM

## 2017-12-15 NOTE — Telephone Encounter (Signed)
Per 4/3 no los

## 2017-12-15 NOTE — Telephone Encounter (Signed)
This RN contacted Dr [facility] office at [phone] per MD request for consult for surgery, mastectomy with reconstruction.  Note pt is hoping to be seen before leaving town on 12/23/2017 . Return date is 12/31/2017.  Per contact with above office - requested for records to be faxed to [phone] with attention to [facility].  Records obtained faxed per above.

## 2017-12-15 NOTE — Progress Notes (Signed)
HPI: Ms. Amstutz was previously seen in the [facility] on 11/14/2017 due to a personal and family history of cancer and concerns regarding a hereditary predisposition to cancer. Please refer to our prior cancer genetics clinic note for more information regarding Ms. Wenz's medical, social and family histories, and our assessment and recommendations, at the time. Ms. Gitlin recent genetic test results were disclosed to her, as well as recommendations warranted by these results. These results and recommendations are discussed in more detail below.  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     History of left breast cancer   08/25/2006 Imaging    MR breast bilateral IMPRESSION: 4.9 cm mass within the upper outer quadrant of the left breast, consistent with known malignancy. Enlarged lower axillary lymph node on the left, consistent with known metastatic disease.      08/27/2006 Imaging    PET Scan IMPRESSION: The left breast primary and left axillary adenopathy are both hypermetabolic. No other areas of hypermetabolism suggest metastatic disease. Neither of the two large liver lesions is hypermetabolic.  Further, a left hepatic dome lesion is partially imaged on the breast MR is more consistent with a hemangioma or less likely cyst.  These lesions could be more entirely evaluated with dedicated hepatic MR or reevaluated on follow-up PET.       09/16/2006 - 11/04/2006 Chemotherapy    The patient had 4 cycles of chemotherapy with FEC for chemotherapy treatment.        11/17/2006 Imaging    MR breast bilateral IMPRESSION: No residual  enhancement of the left upper outer quadrant breast cancer.  The previously seen left axillary adenopathy has also resolved.      11/18/2006 - 12/02/2006 Chemotherapy    The patient had 2 cycles of chemotherapy with Taxotere for chemotherapy treatment.        12/16/2006 - 01/06/2007 Chemotherapy    The patient had  2 cycles of chemotherapy with Abraxane for chemotherapy treatment.        01/09/2007 Imaging    MR breast bilateral IMPRESSION: No residual enhancement in the upper outer quadrant of the left breast.  Left axillary adenopathy is no longer present.      02/01/2007 Initial Diagnosis    History of left breast cancer      2008 Mammogram    Routine screening mammogram IMPRESSION: Palpable mass 3 cm by lobe with this centimeter focus of calcifications. Ultrasound confirmed the presence of a hypoechoic mass measuring about 5 cm in maximum diameter. There was a 2 cm hypoechoic lymph nodes seen.       02/01/2007 Pathology Results    LEFT BREAST, NEEDLE LOCALIZATION BIOPSY IMPRESSION: DENSE FIBROSIS. NO RESIDUAL TUMOR IDENTIFIED. LYMPH NODE, LEFT AXILLARY, EXCISION: NO TUMOR IDENTIFIED IN TEN LYMPH NODES, SEE COMMENT      2008 Pathology Results     Biopsy: Both breasts and lymph nodes showed metastatic carcinoma. ER/PR positive, Ki-67 52%, HER-2/neu 1+.        03/28/2007 - 04/21/2007 Radiation Therapy    Radiation Therapy treatment       Ductal carcinoma in situ (DCIS) of left breast  10/13/2017 Mammogram    Unilateral left diagnostic mammography with tomography and left breast ultrasonography IMPRESSION: The new grouped calcifications in the left breast are suspicious of malignancy.      10/20/2017 Pathology Results    Left needle core biopsy with pathology showing: IMPRESSION: Breast, left, needle core biopsy with ductal carcinoma in situ (DCIS), intermediate nuclear grade, solid and cribriform type with central necrosis and associated microcalcifications. PROGNOSTIC INDICATORS  Results: IMMUNOHISTOCHEMICAL AND MORPHOMETRIC ANALYSIS PERFORMED MANUALLY Estrogen Receptor: 95%, POSITIVE, STRONG STAINING INTENSITY Progesterone Receptor: 80%, POSITIVE, STRONG STAINING INTENSITY      10/24/2017 Initial Diagnosis    Ductal carcinoma in situ (DCIS) of left breast      11/26/2017 Genetic Testing    The Common Hereditary Cancer Panel offered by [facility] includes sequencing and/or deletion duplication testing of the following 47 genes: APC, ATM, AXIN2, BARD1, BMPR1A, BRCA1, BRCA2, BRIP1, CDH1, CDKN2A (p14ARF), CDKN2A (p16INK4a), CKD4, CHEK2, CTNNA1, DICER1, EPCAM (Deletion/duplication testing only), GREM1 (promoter region deletion/duplication testing only), KIT, MEN1, MLH1, MSH2, MSH3, MSH6, MUTYH, NBN, NF1, NHTL1, PALB2, PDGFRA, PMS2, POLD1, POLE, PTEN, RAD50, RAD51C, RAD51D, SDHB, SDHC, SDHD, SMAD4, SMARCA4. STK11, TP53, TSC1, TSC2, and VHL.  The following genes were evaluated for sequence changes only: SDHA and HOXB13 c.251G>A variant only.  Results: No pathogenic variants identified.  Variants of uncertain significance were identified in the genes APC c.4088A>G (p.Lys1363Arg) and BRIP1 c.1655T>C (p.Ile552Thr).  These variants should not be used to alter medical management.  The date of this test report is 11/26/2017.         FAMILY HISTORY:  We obtained a detailed, 4-generation family history.  Significant diagnoses are listed below: Family History  Problem Relation Age of Onset  . Hypertension Mother   . COPD Father   . Prostate cancer Father 34  . Lung cancer Brother 25  . Prostate cancer Brother 75  . Cancer Cousin        type unk dx. >50  . Cancer Cousin 58       type unk  . Cancer Cousin        type and age dx unkown    Ms. Whitlatch has 2 daughters and 1 son in their 21's with no history of cancer.  Ms. Pangallo has 7 grandchildren.  Ms. Guadiana has a brother who developed prostate cancer at 32 and lung cancer at 42.  He is currently in his 81's receiving treatment for  his lung cancer.  He has 2 children.  Ms. Hollingworth also has a paternal half-brother who is 39 with no history of cancer.   Ms. Lavallie' father had prostate cancer at 60, he did not have treatment for this and died at 16 due to COPD.  Ms. Ratcliffe has 1 paternal uncle who died in his 53's due to heart disease.  Ms. Trnka has 3 other paternal uncles and 2 paternal aunts who all died in 31old age42 with no history of cancer. Ms. Alatorre has several paternal cousins.  2 of these cousins (children of her aunt) had cancer.  She does not know what type, and reports it was probably diagnosed later than age 75 for them.  Ms. Furlan' paternal grandparents died in their 35's with no history of cancer.   Ms. Briones' mother is 33 with no history of cancer.   Ms. Turri thinks she had a hysterectomy, but is not sure.  Ms. Servantes has 74 maternal aunts/uncles with no history of cancer.  They all died due to age related disease/heart  disease.  Ms. Pritt' has many maternal cousins.  50 female maternal cousin died at 66 due to cancer dx in her late 47's.  Ms. Sneed reports 5 other maternal cousins had cancer but she does not know at what age and what type. Ms. Deetz' maternal grandfather died in his 60's with no history of cancer.  Ms. Marksberry' maternal grandmother died I her 36's with no history of cancer.   Ms. Casimir is unaware of previous family history of genetic testing for hereditary cancer risks. Patient's maternal ancestors are of [place] descent, and paternal ancestors are of [place] descent. There is no reported Ashkenazi Jewish ancestry. There is no known consanguinity.  GENETIC TEST RESULTS: Genetic testing performed through [facility]'s Common Hereditary Cancers Panel reported out on 11/26/2017 showed no pathogenic mutations. The Common Hereditary Cancer Panel offered by [facility] includes sequencing and/or deletion duplication testing of the following 47 genes: APC, ATM, AXIN2, BARD1, BMPR1A, BRCA1, BRCA2, BRIP1,  CDH1, CDKN2A (p14ARF), CDKN2A (p16INK4a), CKD4, CHEK2, CTNNA1, DICER1, EPCAM (Deletion/duplication testing only), GREM1 (promoter region deletion/duplication testing only), KIT, MEN1, MLH1, MSH2, MSH3, MSH6, MUTYH, NBN, NF1, NHTL1, PALB2, PDGFRA, PMS2, POLD1, POLE, PTEN, RAD50, RAD51C, RAD51D, SDHB, SDHC, SDHD, SMAD4, SMARCA4. STK11, TP53, TSC1, TSC2, and VHL.  The following genes were evaluated for sequence changes only: SDHA and HOXB13 c.251G>A variant only..  A variant of uncertain significance (VUS) in a gene called APC was also noted. c.4088A>G (p.Lys1363Arg). A variant of uncertain significance (VUS) in a gene called BRIP1 was also noted c.1655T>C (p.Ile552Thr).   The test report will be scanned into EPIC and will be located under the Molecular Pathology section of the Results Review tab.A portion of the result report is included below for reference.      We discussed with Ms. Maybee that because current genetic testing is not perfect, it is possible there may be a gene mutation in one of these genes that current testing cannot detect, but that chance is small. We also discussed, that there could be another gene that has not yet been discovered, or that we have not yet tested, that is responsible for the cancer diagnoses in the family. It is also possible there is a hereditary cause for the cancer in the family that Ms. Bristow did not inherit and therefore was not identified in her testing.  Therefore, it is important to remain in touch with cancer genetics in the future so that we can continue to offer Ms. Violet the most up to date genetic testing.   Regarding the VUS's in APC and BRIP1: At this time, it is unknown if these variants are associated with increased cancer risk or if they are normal findings, but most variants such as these get reclassified to being inconsequential. They should not be used to make medical management decisions. With time, we suspect the lab will determine the  significance of these variants, if any. If we do learn more about them, we will try to contact Ms. Perfecto to discuss it further. However, it is important to stay in touch with [place] periodically and keep the address and phone number up to date.  ADDITIONAL GENETIC TESTING: We discussed with Ms. Amor that there are other genes that are associated with increased cancer risk that can be analyzed. The laboratories that offer this testing look at these additional genes via a hereditary cancer gene panel. Should Ms. Fuerte wish to pursue additional genetic testing, we are happy to discuss and coordinate this testing, at any  time.    CANCER SCREENING RECOMMENDATIONS: Ms. Wojtas test result is considered negative (normal).  This means that we have not identified a hereditary cause for her personal and family history of cancer at this time.   While reassuring, this does not definitively rule out a hereditary basis for her cancer. It is still possible that there could be genetic mutations that are undetectable by current technology, or genetic mutations in genes that have not been tested or identified to increase cancer risk.  There could also be a hereditary cause for the cancer in the family that Ms. Qu did not inherit and therefore was not found in her testing.   RECOMMENDATIONS FOR FAMILY MEMBERS: Individuals in this family might be at some increased risk of developing cancer, over the general population risk, simply due to the family history of cancer. We recommended women in this family have a yearly mammogram beginning at age 66, or 12 years younger than the earliest onset of cancer, an annual clinical breast exam, and perform monthly breast self-exams. Women in this family should also have a gynecological exam as recommended by their primary provider. All family members should have a colonoscopy by age 29.  Men in the family should make sure their doctors are aware of the family history of prostate cancer  to best reccommend prostate cancer screening.  All family members should inform their physicians about the family history of cancer so their doctors can make the most appropriate screening recommendations for them. Her daughter's doctors may recommend additional breast cancer screening depending on their personal history and the family history.   FOLLOW-UP: Lastly, we discussed with Ms. Fails that cancer genetics is a rapidly advancing field and it is possible that new genetic tests will be appropriate for her and/or her family members in the future. We encouraged her to remain in contact with cancer genetics on an annual basis so we can update her personal and family histories and let her know of advances in cancer genetics that may benefit this family.   Our contact number was provided. Ms. Cogan questions were answered to her satisfaction, and she knows she is welcome to call us at anytime with additional questions or concerns.   Ferol Luz, MS, [facility] Certified Genetic Counselor [email or web address]

## 2017-12-19 ENCOUNTER — Other Ambulatory Visit: Payer: Self-pay | Admitting: Oncology

## 2017-12-19 ENCOUNTER — Telehealth: Payer: Self-pay | Admitting: *Deleted

## 2017-12-19 NOTE — Telephone Encounter (Signed)
This RN spoke with pt per her call with several inquiries -  She needs the pathology report from the first biopsy ( not the surgical )- she gave this RN a fax number for her husband at [phone].  She is also calling to follow up on ER/PR status of biopsy - which was obtained and shows as positive.  This RN discussed above including referral to [facility] - which would also entail seeing a surgeon at [facility] to coordinate mastectomy with reconstruction.  Note Linda Foster also received a call from Dr Donne Hazel - and per above and with his discussion she is leaning towards proceeding to a mastectomy with reconstruction at a later date.  She is scheduled to see Donne Hazel for further surgical decisions.  No other needs at this time.

## 2017-12-23 ENCOUNTER — Telehealth: Payer: Self-pay | Admitting: *Deleted

## 2017-12-25 ENCOUNTER — Encounter: Payer: Self-pay | Admitting: Oncology

## 2017-12-25 ENCOUNTER — Other Ambulatory Visit: Payer: Self-pay | Admitting: Oncology

## 2017-12-25 MED ORDER — ANASTROZOLE 1 MG PO TABS: 1.0000 mg | ORAL_TABLET | Freq: Every day | ORAL | 4 refills | Status: DC

## 2017-12-25 NOTE — Progress Notes (Signed)
Obtain results from [facility].  The patient had a clinical T3N1 breast cancer, estrogen and progesterone receptor negative, HER-2 negative at 1+, she was treated with neoadjuvant FEC followed by Taxotere after which she had surgery and then radiation, the radiation being completed 05/11/2007.  I do not have a copy of her final pathology.  Dr. Audelia Hives does not mention results in his notes.

## 2017-12-25 NOTE — Progress Notes (Signed)
Send a letter to [facility] regarding starting anastrozole and put the prescription in for her.

## 2017-12-26 DIAGNOSIS — C50912 Malignant neoplasm of unspecified site of left female breast: Secondary | ICD-10-CM | POA: Diagnosis not present

## 2017-12-26 DIAGNOSIS — D0512 Intraductal carcinoma in situ of left breast: Secondary | ICD-10-CM | POA: Diagnosis not present

## 2017-12-27 ENCOUNTER — Telehealth: Payer: Self-pay | Admitting: Oncology

## 2017-12-27 NOTE — Telephone Encounter (Signed)
Faxed records to [facility] on 12/27/17, Release ID [id number]

## 2018-01-02 ENCOUNTER — Ambulatory Visit: Payer: BLUE CROSS/BLUE SHIELD | Admitting: Oncology

## 2018-01-02 DIAGNOSIS — Z8042 Family history of malignant neoplasm of prostate: Secondary | ICD-10-CM | POA: Insufficient documentation

## 2018-01-02 DIAGNOSIS — C50911 Malignant neoplasm of unspecified site of right female breast: Secondary | ICD-10-CM | POA: Diagnosis not present

## 2018-01-02 DIAGNOSIS — R921 Mammographic calcification found on diagnostic imaging of breast: Secondary | ICD-10-CM | POA: Diagnosis not present

## 2018-01-02 DIAGNOSIS — C50912 Malignant neoplasm of unspecified site of left female breast: Secondary | ICD-10-CM | POA: Diagnosis not present

## 2018-01-02 DIAGNOSIS — R922 Inconclusive mammogram: Secondary | ICD-10-CM | POA: Diagnosis not present

## 2018-01-03 DIAGNOSIS — L598 Other specified disorders of the skin and subcutaneous tissue related to radiation: Secondary | ICD-10-CM | POA: Insufficient documentation

## 2018-01-03 DIAGNOSIS — Y842 Radiological procedure and radiotherapy as the cause of abnormal reaction of the patient, or of later complication, without mention of misadventure at the time of the procedure: Secondary | ICD-10-CM | POA: Insufficient documentation

## 2018-01-03 NOTE — Telephone Encounter (Signed)
error opening 

## 2018-01-09 DIAGNOSIS — E785 Hyperlipidemia, unspecified: Secondary | ICD-10-CM | POA: Diagnosis not present

## 2018-01-09 DIAGNOSIS — R7303 Prediabetes: Secondary | ICD-10-CM | POA: Diagnosis not present

## 2018-01-09 DIAGNOSIS — R600 Localized edema: Secondary | ICD-10-CM | POA: Diagnosis not present

## 2018-01-09 DIAGNOSIS — M129 Arthropathy, unspecified: Secondary | ICD-10-CM | POA: Diagnosis not present

## 2018-01-09 DIAGNOSIS — Z0000 Encounter for general adult medical examination without abnormal findings: Secondary | ICD-10-CM | POA: Diagnosis not present

## 2018-01-10 ENCOUNTER — Ambulatory Visit: Payer: BLUE CROSS/BLUE SHIELD | Admitting: Radiation Oncology

## 2018-01-10 ENCOUNTER — Ambulatory Visit: Payer: BLUE CROSS/BLUE SHIELD

## 2018-01-16 ENCOUNTER — Ambulatory Visit: Payer: BLUE CROSS/BLUE SHIELD | Attending: General Surgery | Admitting: Physical Therapy

## 2018-01-16 ENCOUNTER — Encounter: Payer: Self-pay | Admitting: Physical Therapy

## 2018-01-16 DIAGNOSIS — I890 Lymphedema, not elsewhere classified: Secondary | ICD-10-CM

## 2018-01-16 DIAGNOSIS — R293 Abnormal posture: Secondary | ICD-10-CM | POA: Insufficient documentation

## 2018-01-16 DIAGNOSIS — Z483 Aftercare following surgery for neoplasm: Secondary | ICD-10-CM | POA: Diagnosis not present

## 2018-01-16 DIAGNOSIS — C50512 Malignant neoplasm of lower-outer quadrant of left female breast: Secondary | ICD-10-CM | POA: Diagnosis not present

## 2018-01-16 DIAGNOSIS — Z170 Estrogen receptor positive status [ER+]: Secondary | ICD-10-CM | POA: Insufficient documentation

## 2018-01-16 DIAGNOSIS — C50912 Malignant neoplasm of unspecified site of left female breast: Secondary | ICD-10-CM | POA: Diagnosis not present

## 2018-01-16 NOTE — Therapy (Signed)
[place], [place], [address] Phone: [phone]   Fax:  [phone]  Physical Therapy Treatment  Patient Details  Name: Linda Foster MRN: [id number] Date of Birth: 06/17/1956 Referring Provider: Dr. Rolm Bookbinder   Encounter Date: 01/16/2018  PT End of Session - 01/16/18 1411    Visit Number  2    Number of Visits  2    PT Start Time  0923    PT Stop Time  1350    PT Time Calculation (min)  47 min    Activity Tolerance  Patient tolerated treatment well    Behavior During Therapy  [facility] for tasks assessed/performed       Past Medical History:  Diagnosis Date  . A-fib [facility])    one time episode 2018  . Breast cancer ([place]) 2008   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  . Breast cancer ([place]) 2019   left ER/PR +  . Family history of prostate cancer   . Lymphedema 2013  . OSA (obstructive sleep apnea)    does not use CPAP  . Osteoarthritis     Past Surgical History:  Procedure Laterality Date  . ABDOMINAL HYSTERECTOMY    . BREAST LUMPECTOMY Left 2008  . BREAST LUMPECTOMY WITH RADIOACTIVE SEED LOCALIZATION Left 12/05/2017   Procedure: BREAST LUMPECTOMY WITH RADIOACTIVE SEED LOCALIZATION;  Surgeon: Rolm Bookbinder, MD;  Location: [place];  Service: General;  Laterality: Left;  . CESAREAN SECTION      x 3  . KNEE ARTHROSCOPY Right 2007    There were no vitals filed for this visit.  Subjective Assessment - 01/16/18 1309    Subjective  Patient underwent a left lumpectomy on 12/05/17 for her second breast cancer diagnosis. In 2008, she underwent a left lumpectomy and axillary node dissection. Because her cnacer was invasive this time and they can not repeat radiation, a mastectomy has been recommended. She wants reconstruction and plans to undergo a left mastectomy and DIEP flap on 03/07/18 at [facility]. She is currently taking Anastrozole.     Pertinent History  Left lumpectomy, ALND,  chemo, and radiation in 2008 for triple negative breast cancer. Left lumpectomy 12/05/17 for ER/PR positive breast cancer.    Patient Stated Goals  Reduce lymphedema risk and learn post op shoulder ROM HEP    Currently in Pain?  No/denies         [facility] PT Assessment - 01/16/18 0001      Assessment   Medical Diagnosis  s/p left lumpectomy    Referring Provider  Dr. Rolm Bookbinder    Onset Date/Surgical Date  12/05/17    Hand Dominance  Right    Prior Therapy  Baseline assessment      Precautions   Precautions  Other (comment)    Precaution Comments  Recent cancer surgery; left arm lymphedema      Restrictions   Weight Bearing Restrictions  No      Balance Screen   Has the patient fallen in the past 6 months  No    Has the patient had a decrease in activity level because of a fear of falling?   No    Is the patient reluctant to leave their home because of a fear of falling?   No      Home Environment   Living Environment  Private residence    Living Arrangements  Spouse/significant other    Available Help at Discharge  Family  Prior Function   Level of Independence  Independent    Vocation  Retired    Leisure  She reports she is not walking yet for exercise      Cognition   Overall Cognitive Status  Within Functional Limits for tasks assessed      Posture/Postural Control   Posture/Postural Control  Postural limitations    Postural Limitations  Rounded Shoulders;Forward head      ROM / Strength   AROM / PROM / Strength  AROM      AROM   AROM Assessment Site  Shoulder    Right/Left Shoulder  Left    Left Shoulder Extension  42 Degrees    Left Shoulder Flexion  150 Degrees    Left Shoulder ABduction  148 Degrees    Left Shoulder Internal Rotation  59 Degrees    Left Shoulder External Rotation  85 Degrees        LYMPHEDEMA/ONCOLOGY QUESTIONNAIRE - 01/16/18 1329      Type   Cancer Type  Left breast cancer      Surgeries   Lumpectomy Date  12/05/17     Sentinel Lymph Node Biopsy Date  12/05/17    Axillary Lymph Node Dissection Date  09/13/06    Number Lymph Nodes Removed  10 in 2008      Treatment   Active Chemotherapy Treatment  No    Past Chemotherapy Treatment  Yes    Date  09/13/06    Active Radiation Treatment  No    Past Radiation Treatment  Yes    Date  09/13/06    Body Site  left breast and axilla    Current Hormone Treatment  Yes    Date  01/02/18    Drug Name  Anastrozole      What other symptoms do you have   Are you Having Heaviness or Tightness  No    Are you having Pain  No    Are you having pitting edema  No    Is it Hard or Difficult finding clothes that fit  No    Do you have infections  No    Is there Decreased scar mobility  No    Stemmer Sign  No      Lymphedema Stage   Stage  STAGE 2 SPONTANEOUSLY IRREVERSIBLE      Lymphedema Assessments   Lymphedema Assessments  Upper extremities      Right Upper Extremity Lymphedema   10 cm Proximal to Olecranon Process  31.3 cm    Olecranon Process  27.5 cm    10 cm Proximal to Ulnar Styloid Process  24 cm    Just Proximal to Ulnar Styloid Process  17 cm    Across Hand at [employer]  19 cm    At [place] of 2nd Digit  6.5 cm      Left Upper Extremity Lymphedema   10 cm Proximal to Olecranon Process  32.7 cm    Olecranon Process  28.2 cm    10 cm Proximal to Ulnar Styloid Process  23.7 cm    Just Proximal to Ulnar Styloid Process  19.2 cm    Across Hand at [employer]  20 cm    At [place] of 2nd Digit  7 cm        Quick Dash - 01/16/18 0001    Open a tight or new jar  No difficulty    Do heavy household chores (wash walls, wash floors)  No difficulty    Carry a shopping bag or briefcase  No difficulty    Wash your back  Mild difficulty    Use a knife to cut food  No difficulty    Recreational activities in which you take some force or impact through your arm, shoulder, or hand (golf, hammering, tennis)  No difficulty    During the past week, to what  extent has your arm, shoulder or hand problem interfered with your normal social activities with family, friends, neighbors, or groups?  Not at all    During the past week, to what extent has your arm, shoulder or hand problem limited your work or other regular daily activities  Not at all    Arm, shoulder, or hand pain.  None    Tingling (pins and needles) in your arm, shoulder, or hand  None    Difficulty Sleeping  No difficulty    DASH Score  2.27 %                     PT Education - 01/16/18 1411    Education provided  Yes    Education Details  Educated pt on getting a compression sleeve and glove prior to surgery as she already has untreated lymphedema    Person(s) Educated  Patient    Methods  Explanation    Comprehension  Verbalized understanding          PT Long Term Goals - 01/16/18 1416      PT LONG TERM GOAL #1   Title  Patient will demonstrate she has returned to baseline related to shoulder ROM and function since breast surgery.    Time  8    Period  Weeks    Status  Achieved      Breast Clinic Goals - 10/26/17 1439      Patient will be able to verbalize understanding of pertinent lymphedema risk reduction practices relevant to her diagnosis specifically related to skin care.   Time  1    Period  Days    Status  Achieved      Patient will be able to return demonstrate and/or verbalize understanding of the post-op home exercise program related to regaining shoulder range of motion.   Time  1    Period  Days    Status  Achieved      Patient will be able to verbalize understanding of the importance of attending the postoperative After Breast Cancer Class for further lymphedema risk reduction education and therapeutic exercise.   Time  1    Period  Days    Status  Achieved           Plan - 01/16/18 1411    Clinical Impression Statement  Patient has a pre-existing lymphedema in her left arm from a left lumpectomy and ALND in 2008. She  underwent another left lumpectomy 12/05/17 for a new breast cancer. It was believed to be DCIS but on final pathology was actually invasive cancer and because she can not undergo radiation, a mastectomy is being recommended. She plans to undergo a left mastectomy with reconstruction (DIEP Flap) on 03/07/18 at [facility]. She was educated today on available treatments for her left arm lymphedema and she plans to pursue that with [place] after she heals from her next surgery. For now, a compression sleeve and glove was recommended and the order will be faxed to [facility] when it is returned.    Rehab Potential  Excellent    Clinical Impairments Affecting Rehab Potential  Lymphedema has been present for 10 years without treatment    PT Next Visit Plan  Will begin lymphedema treatment if she decides to do that after her mastectomy. Will fax compression garment order to [facility] after we receive it and notify the patient.    Consulted and Agree with Plan of Care  Patient       Patient will benefit from skilled therapeutic intervention in order to improve the following deficits and impairments:  Pain, Decreased knowledge of precautions, Impaired UE functional use, Decreased range of motion, Postural dysfunction  Visit Diagnosis: Malignant neoplasm of lower-outer quadrant of left breast of female, estrogen receptor positive (HCC)  Abnormal posture  Lymphedema, not elsewhere classified  Aftercare following surgery for neoplasm     Problem List Patient Active Problem List   Diagnosis Date Noted  . Genetic testing 12/15/2017  . Primary malignant neoplasm of lower outer quadrant of female breast, left ([place]) 12/14/2017  . Family history of prostate cancer   . 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 Chest pain with moderate risk of acute coronary syndrome 11/03/2016  . Atrial fibrillation with rapid ventricular response ([place]) 11/02/2016  . History of left breast cancer  11/06/2012   PHYSICAL THERAPY DISCHARGE SUMMARY  Visits from Start of Care: 2  Current functional level related to goals / functional outcomes: She has regained full shoulder ROM and function since surgery.   Remaining deficits: Persistent lymphedema in left arm    Education / Equipment: Education on lymphedema treatment  Plan: Patient agrees to discharge.  Patient goals were met. Patient is being discharged due to meeting the stated rehab goals.  ?????    Annia Friendly, [place] 01/16/18 2:17 PM  [place], [place], [address] Phone: [phone]   Fax:  [phone]  Name: Linda Foster MRN: [id number] Date of Birth: 29-Jan-1956

## 2018-01-31 NOTE — Telephone Encounter (Signed)
No entry 

## 2018-02-13 DIAGNOSIS — C50012 Malignant neoplasm of nipple and areola, left female breast: Secondary | ICD-10-CM | POA: Diagnosis not present

## 2018-02-13 DIAGNOSIS — Z01818 Encounter for other preprocedural examination: Secondary | ICD-10-CM | POA: Diagnosis not present

## 2018-02-13 DIAGNOSIS — C50512 Malignant neoplasm of lower-outer quadrant of left female breast: Secondary | ICD-10-CM | POA: Diagnosis not present

## 2018-02-13 DIAGNOSIS — G4733 Obstructive sleep apnea (adult) (pediatric): Secondary | ICD-10-CM | POA: Diagnosis not present

## 2018-02-13 DIAGNOSIS — Z853 Personal history of malignant neoplasm of breast: Secondary | ICD-10-CM | POA: Diagnosis not present

## 2018-02-13 DIAGNOSIS — L598 Other specified disorders of the skin and subcutaneous tissue related to radiation: Secondary | ICD-10-CM | POA: Diagnosis not present

## 2018-02-13 DIAGNOSIS — C50912 Malignant neoplasm of unspecified site of left female breast: Secondary | ICD-10-CM | POA: Diagnosis not present

## 2018-02-13 DIAGNOSIS — I4891 Unspecified atrial fibrillation: Secondary | ICD-10-CM | POA: Diagnosis not present

## 2018-02-13 DIAGNOSIS — D0512 Intraductal carcinoma in situ of left breast: Secondary | ICD-10-CM | POA: Diagnosis not present

## 2018-02-15 ENCOUNTER — Ambulatory Visit (INDEPENDENT_AMBULATORY_CARE_PROVIDER_SITE_OTHER): Payer: BLUE CROSS/BLUE SHIELD | Admitting: Internal Medicine

## 2018-02-15 ENCOUNTER — Encounter: Payer: Self-pay | Admitting: Internal Medicine

## 2018-02-15 VITALS — BP 112/58 | HR 75 | Ht 66.0 in | Wt 201.0 lb

## 2018-02-15 DIAGNOSIS — Z01810 Encounter for preprocedural cardiovascular examination: Secondary | ICD-10-CM

## 2018-02-15 DIAGNOSIS — I480 Paroxysmal atrial fibrillation: Secondary | ICD-10-CM | POA: Diagnosis not present

## 2018-02-15 MED ORDER — METOPROLOL TARTRATE 25 MG PO TABS: 25.0000 mg | ORAL_TABLET | Freq: Two times a day (BID) | ORAL | 11 refills | Status: DC

## 2018-02-15 NOTE — Patient Instructions (Addendum)
Medication Instructions:  Your physician has recommended you make the following change in your medication:  1.  Metoprolol tartrate 25 mg- Take one tablet by mouth twice a day.  Labwork: None ordered.  Testing/Procedures: None ordered.  Follow-Up: Your physician wants you to follow-up in: 8 weeks with Roderic Palau, NP at the Afib clinic.    Any Other Special Instructions Will Be Listed Below (If Applicable).  If you need a refill on your cardiac medications before your next appointment, please call your pharmacy.

## 2018-02-15 NOTE — Progress Notes (Signed)
Electrophysiology Office Note   Date:  02/20/2018   ID:  Marithza, Malachi [birth year], MRN [id number]  PCP:  Carol Ada, MD  Cardiologist:  Dr Radford Pax Primary Electrophysiologist: Dr Caryl Comes  CC: afib   History of Present Illness: Linda Foster is a 62 y.o. female who presents today for urgent add on electrophysiology evaluation.   She has a h/o atrial fibrillation for which she was seen by Dr Caryl Comes 5/18 (his note reviewed).  She reports doing well since that time. She has more recently been found to have breast cancer.  She presented to [facility] 02/13/18 for evaluation of her breast cancer and was documented to have afib with RVR.  Her V rates were 130s.  She was unaware.  Today, she denies symptoms of palpitations, chest pain, shortness of breath, orthopnea, PND, lower extremity edema, claudication, dizziness, presyncope, syncope, bleeding, or neurologic sequela. The patient is tolerating medications without difficulties and is otherwise without complaint today.    Past Medical History:  Diagnosis Date  . A-fib ([place])   . Breast cancer ([place]) 2008   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  . Breast cancer ([place]) 2019   left ER/PR +  . Family history of prostate cancer   . Lymphedema 2013  . OSA (obstructive sleep apnea)    does not use CPAP  . Osteoarthritis    Past Surgical History:  Procedure Laterality Date  . ABDOMINAL HYSTERECTOMY    . BREAST LUMPECTOMY Left 2008  . BREAST LUMPECTOMY WITH RADIOACTIVE SEED LOCALIZATION Left 12/05/2017   Procedure: BREAST LUMPECTOMY WITH RADIOACTIVE SEED LOCALIZATION;  Surgeon: Rolm Bookbinder, MD;  Location: [place];  Service: General;  Laterality: Left;  . CESAREAN SECTION      x 3  . KNEE ARTHROSCOPY Right 2007     Current Outpatient Medications  Medication Sig Dispense Refill  . anastrozole (ARIMIDEX) 1 MG tablet Take 1 tablet (1 mg total) by mouth daily. 90 tablet 4  . B Complex Vitamins (VITAMIN-B COMPLEX PO) Take 2  tablets by mouth daily.    . Cholecalciferol (VITAMIN D3) 2000 units TABS Take 2,000 Units by mouth daily.    . furosemide (LASIX) 40 MG tablet Take 1 tablet (40 mg total) by mouth daily. 90 tablet 3  . glucosamine-chondroitin 500-400 MG tablet Take 2 tablets by mouth daily.     . MULTIPLE VITAMIN PO Take 2 tablets by mouth daily.    . naproxen (NAPROSYN) 500 MG tablet TAKE 1 TABLET BY MOUTH WITH FOOD OR MILK EVERY 12 HOURS AS NEEDED    . potassium chloride (K-DUR) 10 MEQ tablet Take one tablet (10 meq) by mouth once every other day 45 tablet 3  . Red Yeast Rice Extract (RED YEAST RICE PO) Take by mouth daily.    . metoprolol tartrate (LOPRESSOR) 25 MG tablet Take 1 tablet (25 mg total) by mouth 2 (two) times daily. 60 tablet 11   No current facility-administered medications for this visit.     Allergies:   Patient has no known allergies.   Social History:  The patient  reports that she has never smoked. She has never used smokeless tobacco. She reports that she does not drink alcohol or use drugs.   Family History:  The patient's  family history includes COPD in her father; Cancer in her cousin and cousin; Cancer (age of onset: 50) in her cousin; Hypertension in her mother; Lung cancer (age of onset: 53) in her brother; Prostate cancer (age  of onset: 35) in her brother; Prostate cancer (age of onset: 17) in her father.    ROS:  Please see the history of present illness.   All other systems are personally reviewed and negative.    PHYSICAL EXAM: VS:  BP (!) 112/58   Pulse 75   Ht 5\' 6"  (1.676 m)   Wt 201 lb (91.2 kg)   BMI 32.44 kg/m  , BMI Body mass index is 32.44 kg/m. GEN: Well nourished, well developed, in no acute distress  HEENT: normal  Neck: no JVD, carotid bruits, or masses Cardiac: RRR; no murmurs, rubs, or gallops,no edema  Respiratory:  clear to auscultation bilaterally, normal work of breathing GI: soft, nontender, nondistended, + BS MS: no deformity or atrophy    Skin: warm and dry  Neuro:  Strength and sensation are intact Psych: euthymic mood, full affect  EKG:  EKG is ordered today. The ekg ordered today is personally reviewed and shows sinus rhythm 75 bpm, PR 134 msec, QRS 80 msec, Qtc 444 msec.   Recent Labs: 10/26/2017: ALT 15; Hemoglobin 13.6; Platelet Count 198 11/30/2017: BUN 23; Creatinine, Ser 0.74; Potassium 4.3; Sodium 141  personally reviewed   Lipid Panel     Component Value Date/Time   CHOL 232 (H) 11/03/2016 0612   TRIG 63 11/03/2016 0612   HDL 74 11/03/2016 0612   CHOLHDL 3.1 11/03/2016 0612   VLDL 13 11/03/2016 0612   LDLCALC 145 (H) 11/03/2016 0612   personally reviewed   Wt Readings from Last 3 Encounters:  02/15/18 201 lb (91.2 kg)  12/14/17 193 lb 6.4 oz (87.7 kg)  12/05/17 194 lb (88 kg)      Other studies personally reviewed: Additional studies/ records that were reviewed today include: Notes from primary care, ekg from [place], Dr Olin Pia prior notes, echo 11/03/16 Review of the above records today demonstrates: EF 55%, mild LA enlargement   ASSESSMENT AND PLAN:  1.  Paroxysmal atrial fibrillation The patient has minimally symptomatic afib.  She is now back in sinus rhythm.  Her chads2vasc score is 1.  I would therefore not advise anticoagulation at this time.  Ok to stop ASA also given her upcoming surgery. Increase metoprolol to 25mg  BID perioperatively.  She can wean off of this post operatively if no further issues with afib.  2. preop OK to proceed with surgery if medically indicated OK to stop ASA   Follow-up:  AF clinic in 8 weeks Dr Caryl Comes to follow along with the AF clinic thereafter.  Current medicines are reviewed at length with the patient today.   The patient does not have concerns regarding her medicines.  The following changes were made today:  none  Labs/ tests ordered today include:  Orders Placed This Encounter  Procedures  . EKG 12-Lead     Signed, Thompson Grayer, MD     [place] [address] [phone] (office) [phone] (fax)

## 2018-02-17 ENCOUNTER — Telehealth: Payer: Self-pay | Admitting: Internal Medicine

## 2018-02-17 NOTE — Telephone Encounter (Signed)
Patient wants Dr Rayann Heman to know that she is having a 6-8 hour Breast Reconstruction with Free Flap surgery later this month and that the [facility] physician wants her to take ASA 81 mg up until the day of the surgery.  She forgot to mention this at last OV and just wants to inform him.  Thank you.

## 2018-02-17 NOTE — Telephone Encounter (Signed)
New Message   Patient is calling in reference to her appointment that she recently had. She says her doctor at [facility] gave her some conflicting instructions that she wants to discuss. Please call.

## 2018-02-20 ENCOUNTER — Encounter: Payer: Self-pay | Admitting: Internal Medicine

## 2018-02-21 ENCOUNTER — Telehealth: Payer: Self-pay | Admitting: Oncology

## 2018-02-21 ENCOUNTER — Inpatient Hospital Stay: Payer: BLUE CROSS/BLUE SHIELD | Admitting: Oncology

## 2018-02-21 NOTE — Telephone Encounter (Signed)
Patient called to cancel °

## 2018-02-21 NOTE — Telephone Encounter (Signed)
Returned call to Pt. Advised per Dr. Rayann Heman note it was ok for Pt to hold ASA prior to her breast reconstruction surgery. Advised Pt if her surgeon DOES NOT want her to stop her ASA she should listen to her surgeon.   Pt indicates understanding.

## 2018-02-27 ENCOUNTER — Other Ambulatory Visit: Payer: Self-pay | Admitting: *Deleted

## 2018-02-27 MED ORDER — METOPROLOL TARTRATE 25 MG PO TABS: 25.0000 mg | ORAL_TABLET | Freq: Two times a day (BID) | ORAL | 11 refills | Status: DC

## 2018-02-27 NOTE — Telephone Encounter (Signed)
Patient left a msg on the refill vm request a refill on metoprolol 25 mg bid [facility]. Sig was changed on her chart but an updated rx was not sent in.

## 2018-03-06 DIAGNOSIS — Z923 Personal history of irradiation: Secondary | ICD-10-CM | POA: Diagnosis not present

## 2018-03-06 DIAGNOSIS — Z170 Estrogen receptor positive status [ER+]: Secondary | ICD-10-CM | POA: Diagnosis not present

## 2018-03-06 DIAGNOSIS — Z86000 Personal history of in-situ neoplasm of breast: Secondary | ICD-10-CM | POA: Diagnosis not present

## 2018-03-06 DIAGNOSIS — I890 Lymphedema, not elsewhere classified: Secondary | ICD-10-CM | POA: Diagnosis not present

## 2018-03-06 DIAGNOSIS — C50912 Malignant neoplasm of unspecified site of left female breast: Secondary | ICD-10-CM | POA: Diagnosis not present

## 2018-03-06 DIAGNOSIS — I482 Chronic atrial fibrillation: Secondary | ICD-10-CM | POA: Diagnosis not present

## 2018-03-06 DIAGNOSIS — Z90710 Acquired absence of both cervix and uterus: Secondary | ICD-10-CM | POA: Diagnosis not present

## 2018-03-06 DIAGNOSIS — C50512 Malignant neoplasm of lower-outer quadrant of left female breast: Secondary | ICD-10-CM | POA: Diagnosis not present

## 2018-03-06 DIAGNOSIS — G8929 Other chronic pain: Secondary | ICD-10-CM | POA: Diagnosis not present

## 2018-03-06 DIAGNOSIS — G4733 Obstructive sleep apnea (adult) (pediatric): Secondary | ICD-10-CM | POA: Diagnosis not present

## 2018-03-06 DIAGNOSIS — Z421 Encounter for breast reconstruction following mastectomy: Secondary | ICD-10-CM | POA: Diagnosis not present

## 2018-03-06 DIAGNOSIS — Z9221 Personal history of antineoplastic chemotherapy: Secondary | ICD-10-CM | POA: Diagnosis not present

## 2018-03-07 DIAGNOSIS — Z421 Encounter for breast reconstruction following mastectomy: Secondary | ICD-10-CM | POA: Diagnosis not present

## 2018-03-07 DIAGNOSIS — Z170 Estrogen receptor positive status [ER+]: Secondary | ICD-10-CM | POA: Diagnosis not present

## 2018-03-07 DIAGNOSIS — C50512 Malignant neoplasm of lower-outer quadrant of left female breast: Secondary | ICD-10-CM | POA: Diagnosis not present

## 2018-03-07 DIAGNOSIS — C50912 Malignant neoplasm of unspecified site of left female breast: Secondary | ICD-10-CM | POA: Diagnosis not present

## 2018-03-07 DIAGNOSIS — Z923 Personal history of irradiation: Secondary | ICD-10-CM | POA: Diagnosis not present

## 2018-03-15 DIAGNOSIS — L598 Other specified disorders of the skin and subcutaneous tissue related to radiation: Secondary | ICD-10-CM | POA: Diagnosis not present

## 2018-03-15 DIAGNOSIS — Z9889 Other specified postprocedural states: Secondary | ICD-10-CM | POA: Diagnosis not present

## 2018-03-15 DIAGNOSIS — C50512 Malignant neoplasm of lower-outer quadrant of left female breast: Secondary | ICD-10-CM | POA: Diagnosis not present

## 2018-03-15 DIAGNOSIS — C50012 Malignant neoplasm of nipple and areola, left female breast: Secondary | ICD-10-CM | POA: Diagnosis not present

## 2018-03-20 DIAGNOSIS — C50911 Malignant neoplasm of unspecified site of right female breast: Secondary | ICD-10-CM | POA: Diagnosis not present

## 2018-03-23 ENCOUNTER — Telehealth: Payer: Self-pay | Admitting: Oncology

## 2018-03-23 DIAGNOSIS — C50512 Malignant neoplasm of lower-outer quadrant of left female breast: Secondary | ICD-10-CM | POA: Diagnosis not present

## 2018-03-23 DIAGNOSIS — L598 Other specified disorders of the skin and subcutaneous tissue related to radiation: Secondary | ICD-10-CM | POA: Diagnosis not present

## 2018-03-23 DIAGNOSIS — D0512 Intraductal carcinoma in situ of left breast: Secondary | ICD-10-CM | POA: Diagnosis not present

## 2018-03-23 NOTE — Telephone Encounter (Signed)
Made appt per sch msg.  Called patient and left message.  Sent calendar as well.

## 2018-03-27 DIAGNOSIS — C50012 Malignant neoplasm of nipple and areola, left female breast: Secondary | ICD-10-CM | POA: Diagnosis not present

## 2018-04-04 NOTE — Progress Notes (Signed)
[place]  [phone] Fax:([phone]) [phone]     ID: Linda Foster DOB: Nov 26, 1955  MR#: [id number]  DJM#:[id number]  Patient Care Team: Carol Ada, MD as PCP - General (Family Medicine) Katerine Compton, MD as Consulting Physician (Obstetrics and Gynecology) Marlou Sa Tonna Corner, MD as Consulting Physician (Orthopedic Surgery) Rolm Bookbinder, MD as Consulting Physician (General Surgery) Maciah Schweigert, Virgie Dad, MD as Consulting Physician (Oncology) Irene Limbo, MD as Consulting Physician (Plastic Surgery) Jacinto Reap, MD (Plastic Surgery) Belva Crome, MD as Consulting Physician (Cardiology) Belva Crome, MD as Consulting Physician (Cardiology) OTHER MD:   CHIEF COMPLAINT: estrogen receptor positive breast cancer  CURRENT TREATMENT: anastrozole   HISTORY OF CURRENT ILLNESS: Linda Foster had routine screening mammography on 10/03/2017 showing a possible abnormality in the left breast. She underwent unilateral left breast diagnostic mammography with tomography at [facility] on 10/13/2017 showing: breast density category C. There are new grouped calcifications in the left breast lower outer quadrant and middle depth. Ultrasound of the left axilla on 10/20/2017 showed no abnormalities.   Accordingly on 10/20/2017 she proceeded to biopsy of the left breast area in question. The pathology from this procedure showed ([id number]): Ductal carcinoma in situ, intermediate grade. The largest focus measures 4 mm. The carcinoma is cribriform type with necrosis and associated microcalcifications. Prognostic indicators significant for: estrogen receptor, 95% positive and progesterone receptor, 80% positive, both with strong staining intensity.  Accordingly on 12/05/2017 she proceeded to left lumpectomy.  The pathology [id number]) showed invasive ductal carcinoma, grade 1, measuring 0.3 cm, in addition to low-grade ductal carcinoma in situ.  Both the invasive  and in situ components were focally less than a millimeter from the inferior margin.  Note that Linda Foster has a history of prior breast cancer, status post left lumpectomy for what seems to have been a T2 N3 invasive ductal carcinoma, estrogen and progesterone receptor negative.  She was treated with chemotherapy followed by radiation.  Accordingly the patient is not a candidate for repeat radiation and the feasibility or value of sentinel lymph node sampling is questionable.  Linda Foster has been advised by Dr. Donne Hazel that mastectomy is standard in this situation and she has met with Dr. Iran Planas to consider reconstruction options.  However the patient is interested in exploring the possibility of lumpectomy.  Her subsequent history is as detailed below.  INTERVAL HISTORY: Linda Foster returns today for follow up of her estrogen receptor positive breast cancer accompanied by her husband. She continues on anastrozole, with good tolerance. She has some hot flashes but there has been no significant change. She also has some vaginal dryness. She obtains this at a reasonable cost.    Since her last visit, she was seen by Drs. Green[place] and Hollenbeck at [facility] and underwent simple mastectomy with D IEP reconstruction on 03/07/2018.  The pathology from this procedure ([id number]) showed no residual carcinoma.  Of note as part of her preop evaluation  an ECG was performed and the patient was found to have atrial fibrilation. Linda Foster was placed on metoprolol and she followed up with her cardiologist Dr. Tamala Julian on 02/15/2018.   She had DIEP breast reconstruction surgery under Dr. Abelino Derrick and Dr. Carvel Getting at [facility] on 03/07/2018. She notes that her experience was great and she didn't have any complications.    REVIEW OF SYSTEMS: Linda Foster reports that she has not had any episodes of afib since her breast recontruction until today. She notes that when she stood up today, she felt dizzy, but this  resolved. She is walking for  exercise and avoiding heavy lifting. She was able to do her laundry. She denies unusual headaches, visual changes, nausea, vomiting, or dizziness. There has been no unusual cough, phlegm production, or pleurisy. This been no change in bowel or bladder habits. She denies unexplained fatigue or unexplained weight loss, bleeding, rash, or fever. A detailed review of systems was otherwise stable.    PAST MEDICAL HISTORY: Past Medical History:  Diagnosis Date  . A-fib ([place])   . 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 Breast cancer ([place]) 2019   left ER/PR +  . Family history of prostate cancer   . Lymphedema 2013  . OSA (obstructive sleep apnea)    does not use CPAP  . Osteoarthritis     PAST SURGICAL HISTORY: Past Surgical History:  Procedure Laterality Date  . ABDOMINAL HYSTERECTOMY    . BREAST LUMPECTOMY Left 2008  . BREAST LUMPECTOMY WITH RADIOACTIVE SEED LOCALIZATION Left 12/05/2017   Procedure: BREAST LUMPECTOMY WITH RADIOACTIVE SEED LOCALIZATION;  Surgeon: Rolm Bookbinder, MD;  Location: [facility];  Service: General;  Laterality: Left;  . CESAREAN SECTION      x 3  . KNEE ARTHROSCOPY Right 2007  She had an episode of  A Fib in 2018. She was being seen by Dr. Oran Rein about 10 years ago. Hysterectomy with USO  FAMILY HISTORY Family History  Problem Relation Age of Onset  . Hypertension Mother   . COPD Father   . Prostate cancer Father 39  . Lung cancer Brother 41  . Prostate cancer Brother 43  . Cancer Cousin        type unk dx. >50  . Cancer Cousin 58       type unk  . Cancer Cousin        type and age dx unkown  Very detailed family history is available in the genetics counseling note from 11/14/2017.  The patient has been genetically tested and no pathogenic mutation was identified (see below).  GYNECOLOGIC HISTORY:  Menarche: 62 years old Age at first live birth: 62 years old The patient is GXP3. She is s/p hysterectomy and USO  in 2007. She did not receive HRT. She took oral contraceptives for more than a year remotely with no complications.   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 Linda Foster, her husband of 62 years, owns [employer]. The patient's daughter, Linda Foster, is an Psychologist, prison and probation services at [employer]. The patient's daughter, Linda Foster, is a Probation officer with her own business. The patient's son, Linda Foster, works with Linda Foster at [employer], but he also has a degree in Therapist, sports. The patient has 7 grandchildren. She attends [employer].     ADVANCED DIRECTIVES:    HEALTH MAINTENANCE: Social History   Tobacco Use  . Smoking status: Never Smoker  . Smokeless tobacco: Never Used  Substance Use Topics  . Alcohol use: No  . Drug use: No     Colonoscopy: 10 years ago/ [facility]   PAP:  Bone density: January 2012/ normal    No Known Allergies  Current Outpatient Medications  Medication Sig Dispense Refill  . anastrozole (ARIMIDEX) 1 MG tablet Take 1 tablet (1 mg total) by mouth daily. 90 tablet 4  . B Complex Vitamins (VITAMIN-B COMPLEX PO) Take 2 tablets by mouth daily.    . Cholecalciferol (VITAMIN D3) 2000 units TABS Take 2,000 Units  by mouth daily.    . furosemide (LASIX) 40 MG tablet Take 1 tablet (40 mg total) by mouth daily. 90 tablet 3  . glucosamine-chondroitin 500-400 MG tablet Take 2 tablets by mouth daily.     . metoprolol tartrate (LOPRESSOR) 25 MG tablet Take 1 tablet (25 mg total) by mouth 2 (two) times daily. 60 tablet 11  . MULTIPLE VITAMIN PO Take 2 tablets by mouth daily.    . naproxen (NAPROSYN) 500 MG tablet TAKE 1 TABLET BY MOUTH WITH FOOD OR MILK EVERY 12 HOURS AS NEEDED    . potassium chloride (K-DUR) 10 MEQ tablet Take one tablet (10 meq) by mouth once every other day 45 tablet 3  . Red Yeast Rice Extract (RED YEAST RICE PO) Take by mouth daily.     No current facility-administered medications for this visit.      OBJECTIVE: Middle-aged white woman in no acute distress  Vitals:   04/06/18 1130  BP: 112/78  Pulse: (!) 172  Resp: 20  Temp: 98.5 F (36.9 C)  SpO2: 100%     Body mass index is 32.91 kg/m.   Wt Readings from Last 3 Encounters:  04/06/18 203 lb 14.4 oz (92.5 kg)  02/15/18 201 lb (91.2 kg)  12/14/17 193 lb 6.4 oz (87.7 kg)      ECOG FS:1 - Symptomatic but completely ambulatory  Sclerae unicteric, EOMs intact Oropharynx clear and moist No cervical or supraclavicular adenopathy Lungs no rales or rhonchi Heart initial heart rate was greater than 170, but later spontaneously converted to sinus r with a rate of 83  abd soft, nontender, positive bowel sounds MSK no focal spinal tenderness, no upper extremity lymphedema Neuro: nonfocal, well oriented, appropriate affect Breasts: Right breast is benign.  The left breast is status post mastectomy with DIP reconstruction.  There is no evidence of necrosis, dehiscence, erythema, or swelling.  The incision is healing very nicely.  The cosmetic result is very good.  There is no evidence of residual recurrent disease.  Both axillae are benign.  EKG: This rhythm at 83 beats an hour  LAB RESULTS:  CMP     Component Value Date/Time   NA 141 11/30/2017 0726   NA 143 02/28/2017 0937   K 4.3 11/30/2017 0726   CL 105 11/30/2017 0726   CO2 29 11/30/2017 0726   GLUCOSE 128 (H) 11/30/2017 0726   BUN 23 (H) 11/30/2017 0726   BUN 14 02/28/2017 0937   CREATININE 0.74 11/30/2017 0726   CREATININE 0.82 10/26/2017 1218   CALCIUM 9.4 11/30/2017 0726   PROT 7.5 10/26/2017 1218   ALBUMIN 4.5 10/26/2017 1218   AST 18 10/26/2017 1218   ALT 15 10/26/2017 1218   ALKPHOS 84 10/26/2017 1218   BILITOT 0.9 10/26/2017 1218   GFRNONAA >60 11/30/2017 0726   GFRNONAA >60 10/26/2017 1218   GFRAA >60 11/30/2017 0726   GFRAA >60 10/26/2017 1218    No results found for: TOTALPROTELP, ALBUMINELP, A1GS, A2GS, BETS, BETA2SER, GAMS, MSPIKE, SPEI  No  results found for: KPAFRELGTCHN, LAMBDASER, KAPLAMBRATIO  Lab Results  Component Value Date   WBC 5.8 10/26/2017   NEUTROABS 2.8 10/26/2017   HGB 13.6 10/26/2017   HCT 41.0 10/26/2017   MCV 89.5 10/26/2017   PLT 198 10/26/2017    _0 @  Lab Results  Component Value Date   LABCA2 30 09/16/2011    No components found for: [id number]  No results for input(s): INR in the last 168 hours.  Lab Results  Component Value Date   LABCA2 30 09/16/2011    No results found for: ZJI967  No results found for: ELF810  No results found for: FBP102  No results found for: CA2729  No components found for: HGQUANT  No results found for: CEA1 / No results found for: CEA1   No results found for: AFPTUMOR  No results found for: [place]  No results found for: PSA1  No visits with results within 3 Day(s) from this visit.  Latest known visit with results is:  Admission on 12/05/2017, Discharged on 12/05/2017  Component Date Value Ref Range Status  . Sodium 11/30/2017 141  135 - 145 mmol/L Final  . Potassium 11/30/2017 4.3  3.5 - 5.1 mmol/L Final  . Chloride 11/30/2017 105  101 - 111 mmol/L Final  . CO2 11/30/2017 29  22 - 32 mmol/L Final  . Glucose, Bld 11/30/2017 128* 65 - 99 mg/dL Final  . BUN 11/30/2017 23* 6 - 20 mg/dL Final  . Creatinine, Ser 11/30/2017 0.74  0.44 - 1.00 mg/dL Final  . Calcium 11/30/2017 9.4  8.9 - 10.3 mg/dL Final  . GFR calc non Af Amer 11/30/2017 >60  >60 mL/min Final  . GFR calc Af Amer 11/30/2017 >60  >60 mL/min Final   Comment: (NOTE) The eGFR has been calculated using the CKD EPI equation. This calculation has not been validated in all clinical situations. eGFR's persistently <60 mL/min signify possible Chronic Kidney Disease.   Georgiann Hahn gap 11/30/2017 7  5 - 15 Final   Performed at [facility], [place] [address], [place], [place] [address]    (this displays the last labs from the last 3 days)  No results found for: TOTALPROTELP,  ALBUMINELP, A1GS, A2GS, BETS, BETA2SER, GAMS, MSPIKE, SPEI (this displays SPEP labs)  No results found for: KPAFRELGTCHN, LAMBDASER, KAPLAMBRATIO (kappa/lambda light chains)  No results found for: HGBA, HGBA2QUANT, HGBFQUANT, HGBSQUAN (Hemoglobinopathy evaluation)   Lab Results  Component Value Date   LDH 162 09/02/2010    No results found for: IRON, TIBC, IRONPCTSAT (Iron and TIBC)  No results found for: FERRITIN  Urinalysis    Component Value Date/Time   LABSPEC 1.020 10/30/2008 1837   PHURINE 7.5 10/30/2008 1837   GLUCOSEU NEGATIVE 10/30/2008 1837   HGBUR LARGE (A) 10/30/2008 [place] 10/30/2008 [place] 10/30/2008 1837   PROTEINUR 100 (A) 10/30/2008 1837   UROBILINOGEN 0.2 10/30/2008 1837   NITRITE NEGATIVE 10/30/2008 1837   LEUKOCYTESUR (A) 10/30/2008 1837    MODERATE Biochemical Testing Only. Please order routine urinalysis from main lab if confirmatory testing is needed.     STUDIES: Outside results discussed with the patient  ELIGIBLE FOR AVAILABLE RESEARCH PROTOCOL: no  ASSESSMENT: 62 y.o. [place]  woman  (1) status post left lumpectomy and axillary lymph node dissection in 2008 for a T2N3 invasive ductal carcinoma, triple negative, pathology and treatment details not available at present--being retrieved  (a) status post adjuvant chemotherapy  (b) status post radiation  (2) status post left lumpectomy 12/05/2017 for a pT1a cN0, stage IA invasive ductal carcinoma, prognostic panel pending  (a) pathology also showed ductal carcinoma in situ, estrogen and progesterone receptor strongly positive  (b) both invasive and noninvasive components are focally less than 1 mm from the inferior margin  (3) genetics testing 11/14/2017 through the STAT Breast cancer panel offered by [facility] found no deleterious mutations in ATM, BRCA1, BRCA2, CDH1, CHEK2, PALB2, PTEN, STK11 and TP53.   (a)  reflex genetic testing through the Common  Hereditary Cancer Panel offered by [facility] additionally found no deleterious mutations in APC, ATM, AXIN2, BARD1, BMPR1A, BRCA1, BRCA2, BRIP1, CDH1, CDKN2A (p14ARF), CDKN2A (p16INK4a), CKD4, CHEK2, CTNNA1, DICER1, EPCAM (Deletion/duplication testing only), GREM1 (promoter region deletion/duplication testing only), KIT, MEN1, MLH1, MSH2, MSH3, MSH6, MUTYH, NBN, NF1, NHTL1, PALB2, PDGFRA, PMS2, POLD1, POLE, PTEN, RAD50, RAD51C, RAD51D, SDHB, SDHC, SDHD, SMAD4, SMARCA4. STK11, TP53, TSC1, TSC2, and VHL.  The following genes were evaluated for sequence changes only: SDHA and HOXB13 c.251G>A variant only.  (c) variants of uncertain significance and APC and BR IP 1 were identified  (4) anastrozole started 12/25/2017  (5) status post left simple mastectomy with D IEP reconstruction at [facility] 03/07/2018  (a) pathology showed no residual cancer in the surgical sample  (6) atrial fibrillation  PLAN:  From a breast cancer standpoint Eddie Dibbles is doing great.  She has a very good prognosis.  She is tolerating anastrozole without any unusual side effects.  We discussed the fact that this can thin her bones and I have put her in for a bone density to be done at [facility] next year when she has her mammography  She had an episode of A. fib on the way to the examination room.  By the time we got the EKG it had reverted to sinus rhythm.  I gave her a copy of the EKG.  I tried to reach Dr. Tamala Julian by phone but was not able to get through and sent him a note suggesting that she should probably see him sooner rather than later.  In the meantime I suggested she go back on metoprolol and start a baby aspirin  As far as breast cancer follow-up is concerned she will return to see me in May of next year  She knows to call for any other issues that may develop before the next visit.   Lanson Randle, Virgie Dad, MD  04/06/18 12:10 PM Medical Oncology and Hematology [facility] [address] [place], [place]  [address] Tel. [phone]    Fax. [phone]  Alice Rieger, am acting as scribe for Chauncey Cruel MD.  I, Lurline Del MD, have reviewed the above documentation for accuracy and completeness, and I agree with the above.

## 2018-04-06 ENCOUNTER — Telehealth: Payer: Self-pay | Admitting: Oncology

## 2018-04-06 ENCOUNTER — Other Ambulatory Visit: Payer: Self-pay

## 2018-04-06 ENCOUNTER — Inpatient Hospital Stay: Payer: BLUE CROSS/BLUE SHIELD | Attending: Oncology | Admitting: Oncology

## 2018-04-06 VITALS — BP 112/78 | HR 172 | Temp 98.5°F | Resp 20 | Ht 66.0 in | Wt 203.9 lb

## 2018-04-06 DIAGNOSIS — D0512 Intraductal carcinoma in situ of left breast: Secondary | ICD-10-CM

## 2018-04-06 DIAGNOSIS — Z170 Estrogen receptor positive status [ER+]: Secondary | ICD-10-CM

## 2018-04-06 DIAGNOSIS — C50512 Malignant neoplasm of lower-outer quadrant of left female breast: Secondary | ICD-10-CM | POA: Insufficient documentation

## 2018-04-06 DIAGNOSIS — Z79811 Long term (current) use of aromatase inhibitors: Secondary | ICD-10-CM | POA: Diagnosis not present

## 2018-04-06 DIAGNOSIS — I4891 Unspecified atrial fibrillation: Secondary | ICD-10-CM | POA: Diagnosis not present

## 2018-04-06 NOTE — Telephone Encounter (Signed)
Per 7/25 los, faxed order to [facility] for bone density and mailed patient calendar.

## 2018-04-06 NOTE — Telephone Encounter (Signed)
Per 7/25 los.  Could not give patient AVS or calendar due to power outage.  Will fax order to [facility] for bone density and send patient calendar once printer is working.

## 2018-04-07 ENCOUNTER — Telehealth: Payer: Self-pay | Admitting: *Deleted

## 2018-04-07 NOTE — Telephone Encounter (Signed)
-----   Message from Belva Crome, MD sent at 04/07/2018  9:01 AM EDT ----- Thanks Gus. I will get her worked in within the next month or earlier if problems.  Anderson Malta please see if we can have her seen within the next 2 to 4 weeks.  Let her know to call if she has sensation of rapid heartbeat that is persisting, dizziness, shortness of breath, or chest pain.  A 48-hour Holter. ----- Message ----- From: Chauncey Cruel, MD Sent: 04/06/2018  12:05 PM To: Belva Crome, MD  Hi Hank! I saw Ms Wombles today for her breast cancer which is fine. While in our office she had a HR of >170. When I got an EKG it was back to normal (83, NSR). I told her to restart her metoprolol and start a baby ASA but I wonder if she should see you sooner rather than later-- they tell me they are going to see you but still have no appointment  Thanks!  Gus

## 2018-04-07 NOTE — Telephone Encounter (Signed)
Spoke with pt to schedule.  She states that she has never actually seen Dr. Tamala Julian but was seen by another provider in this office but couldn't remember his name yesterday.  Pt's husband said Dr. Thompson Caul name and that's why he was contacted.  Advised pt she had seen Dr. Rayann Heman before.  Pt states she would like to see Dr. Rayann Heman again if that's ok since he had seen her for this same issue.  Advised that would be fine and I would make the EP scheduler aware so she can reach out with an appt.  Pt verbalized understanding and was appreciative for call.

## 2018-04-10 DIAGNOSIS — L598 Other specified disorders of the skin and subcutaneous tissue related to radiation: Secondary | ICD-10-CM | POA: Diagnosis not present

## 2018-04-10 DIAGNOSIS — C50012 Malignant neoplasm of nipple and areola, left female breast: Secondary | ICD-10-CM | POA: Diagnosis not present

## 2018-04-10 DIAGNOSIS — C50512 Malignant neoplasm of lower-outer quadrant of left female breast: Secondary | ICD-10-CM | POA: Diagnosis not present

## 2018-04-10 DIAGNOSIS — Z853 Personal history of malignant neoplasm of breast: Secondary | ICD-10-CM | POA: Diagnosis not present

## 2018-04-11 ENCOUNTER — Other Ambulatory Visit: Payer: Self-pay | Admitting: *Deleted

## 2018-04-11 DIAGNOSIS — D0512 Intraductal carcinoma in situ of left breast: Secondary | ICD-10-CM

## 2018-04-11 DIAGNOSIS — Z853 Personal history of malignant neoplasm of breast: Secondary | ICD-10-CM

## 2018-04-11 NOTE — Progress Notes (Signed)
Pt contacted this RN to state she will be completing reconstructive surgery on her left breast in October which involves cosmetic reconstruction on her right breast for symmetry. Saya states surgeon would like for her to obtain a new baseline mammogram on her right breast prior to reconstruction.  Pt will need above approximately mid Sept 2019.  Order placed and faxed to [facility].

## 2018-04-14 ENCOUNTER — Encounter (HOSPITAL_COMMUNITY): Payer: Self-pay | Admitting: Nurse Practitioner

## 2018-04-14 ENCOUNTER — Ambulatory Visit (HOSPITAL_COMMUNITY)
Admission: RE | Admit: 2018-04-14 | Discharge: 2018-04-14 | Disposition: A | Discharge status: Home / Self Care | Payer: BLUE CROSS/BLUE SHIELD | Source: Ambulatory Visit | Attending: Nurse Practitioner | Admitting: Nurse Practitioner

## 2018-04-14 VITALS — BP 112/78 | HR 64 | Ht 66.0 in | Wt 203.0 lb

## 2018-04-14 DIAGNOSIS — M1990 Unspecified osteoarthritis, unspecified site: Secondary | ICD-10-CM | POA: Diagnosis not present

## 2018-04-14 DIAGNOSIS — Z8249 Family history of ischemic heart disease and other diseases of the circulatory system: Secondary | ICD-10-CM | POA: Diagnosis not present

## 2018-04-14 DIAGNOSIS — Z79899 Other long term (current) drug therapy: Secondary | ICD-10-CM | POA: Diagnosis not present

## 2018-04-14 DIAGNOSIS — Z170 Estrogen receptor positive status [ER+]: Secondary | ICD-10-CM | POA: Insufficient documentation

## 2018-04-14 DIAGNOSIS — C50912 Malignant neoplasm of unspecified site of left female breast: Secondary | ICD-10-CM | POA: Diagnosis not present

## 2018-04-14 DIAGNOSIS — I480 Paroxysmal atrial fibrillation: Secondary | ICD-10-CM | POA: Diagnosis not present

## 2018-04-14 DIAGNOSIS — Z801 Family history of malignant neoplasm of trachea, bronchus and lung: Secondary | ICD-10-CM | POA: Insufficient documentation

## 2018-04-14 DIAGNOSIS — Z825 Family history of asthma and other chronic lower respiratory diseases: Secondary | ICD-10-CM | POA: Diagnosis not present

## 2018-04-14 DIAGNOSIS — Z90710 Acquired absence of both cervix and uterus: Secondary | ICD-10-CM | POA: Diagnosis not present

## 2018-04-14 DIAGNOSIS — Z79811 Long term (current) use of aromatase inhibitors: Secondary | ICD-10-CM | POA: Insufficient documentation

## 2018-04-14 DIAGNOSIS — Z853 Personal history of malignant neoplasm of breast: Secondary | ICD-10-CM | POA: Insufficient documentation

## 2018-04-14 DIAGNOSIS — G4733 Obstructive sleep apnea (adult) (pediatric): Secondary | ICD-10-CM | POA: Insufficient documentation

## 2018-04-14 DIAGNOSIS — L570 Actinic keratosis: Secondary | ICD-10-CM | POA: Diagnosis not present

## 2018-04-14 MED ORDER — METOPROLOL TARTRATE 25 MG PO TABS: 12.5000 mg | ORAL_TABLET | Freq: Two times a day (BID) | ORAL | 11 refills | Status: DC

## 2018-04-14 NOTE — Patient Instructions (Signed)
Metoprolol 1/2 tab twice a day

## 2018-04-14 NOTE — Progress Notes (Signed)
Primary Care Physician: Carol Ada, MD Referring Physician:Dr. Allred( previous Dr. Caryl Comes)   Linda Foster is a 62 y.o. female with a h/o paroxysmal afib that is in the afib clinic for f/u of his visit in June. She is in SR. She is pending reconstructive surgery, h/o breast , in October. She is on BB and would recommend continuation of its use perioperatively to prevent break through Afib. She has not noted  any irregular rhythm.Not on anticoagulation with CHA2DS2VASc score of 1.   Today, she denies symptoms of palpitations, chest pain, shortness of breath, orthopnea, PND, lower extremity edema, dizziness, presyncope, syncope, or neurologic sequela. The patient is tolerating medications without difficulties and is otherwise without complaint today.   Past Medical History:  Diagnosis Date  . A-fib ([place])   . 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 Breast cancer ([place]) 2019   left ER/PR +  . Family history of prostate cancer   . Lymphedema 2013  . OSA (obstructive sleep apnea)    does not use CPAP  . Osteoarthritis    Past Surgical History:  Procedure Laterality Date  . ABDOMINAL HYSTERECTOMY    . BREAST LUMPECTOMY Left 2008  . BREAST LUMPECTOMY WITH RADIOACTIVE SEED LOCALIZATION Left 12/05/2017   Procedure: BREAST LUMPECTOMY WITH RADIOACTIVE SEED LOCALIZATION;  Surgeon: Rolm Bookbinder, MD;  Location: [facility];  Service: General;  Laterality: Left;  . CESAREAN SECTION      x 3  . KNEE ARTHROSCOPY Right 2007    Current Outpatient Medications  Medication Sig Dispense Refill  . anastrozole (ARIMIDEX) 1 MG tablet Take 1 tablet (1 mg total) by mouth daily. 90 tablet 4  . Cholecalciferol (VITAMIN D3) 2000 units TABS Take 2,000 Units by mouth daily.    . furosemide (LASIX) 40 MG tablet Take 1 tablet (40 mg total) by mouth daily. 90 tablet 3  . metoprolol tartrate (LOPRESSOR) 25 MG tablet Take 0.5 tablets (12.5 mg total) by mouth 2 (two) times  daily. 60 tablet 11  . MULTIPLE VITAMIN PO Take 2 tablets by mouth daily.    . naproxen (NAPROSYN) 500 MG tablet TAKE 1 TABLET BY MOUTH WITH FOOD OR MILK EVERY 12 HOURS AS NEEDED    . potassium chloride (K-DUR) 10 MEQ tablet Take one tablet (10 meq) by mouth once every other day 45 tablet 3  . B Complex Vitamins (VITAMIN-B COMPLEX PO) Take 2 tablets by mouth daily.    Marland Kitchen glucosamine-chondroitin 500-400 MG tablet Take 2 tablets by mouth daily.     . Red Yeast Rice Extract (RED YEAST RICE PO) Take by mouth daily.     No current facility-administered medications for this encounter.     No Known Allergies  Social History   Socioeconomic History  . Marital status: Married    Spouse name: Not on file  . Number of children: Not on file  . Years of education: Not on file  . Highest education level: Not on file  Occupational History  . Not on file  Social Needs  . Financial resource strain: Not on file  . Food insecurity:    Worry: Not on file    Inability: Not on file  . Transportation needs:    Medical: Not on file    Non-medical: Not on file  Tobacco Use  . Smoking status: Never Smoker  . Smokeless tobacco: Never Used  Substance and Sexual Activity  . Alcohol use: No  .  Drug use: No  . Sexual activity: Not on file  Lifestyle  . Physical activity:    Days per week: Not on file    Minutes per session: Not on file  . Stress: Not on file  Relationships  . Social connections:    Talks on phone: Not on file    Gets together: Not on file    Attends religious service: Not on file    Active member of club or organization: Not on file    Attends meetings of clubs or organizations: Not on file    Relationship status: Not on file  . Intimate partner violence:    Fear of current or ex partner: Not on file    Emotionally abused: Not on file    Physically abused: Not on file    Forced sexual activity: Not on file  Other Topics Concern  . Not on file  Social History Narrative  .  Not on file    Family History  Problem Relation Age of Onset  . Hypertension Mother   . COPD Father   . Prostate cancer Father 51  . Lung cancer Brother 46  . Prostate cancer Brother 55  . Cancer Cousin        type unk dx. >50  . Cancer Cousin 58       type unk  . Cancer Cousin        type and age dx unkown    ROS- All systems are reviewed and negative except as per the HPI above  Physical Exam: Vitals:   04/14/18 1112  BP: 112/78  Pulse: 64  Weight: 203 lb (92.1 kg)  Height: 5\' 6"  (1.676 m)   Wt Readings from Last 3 Encounters:  04/14/18 203 lb (92.1 kg)  04/06/18 203 lb 14.4 oz (92.5 kg)  02/15/18 201 lb (91.2 kg)    Labs: Lab Results  Component Value Date   NA 141 11/30/2017   K 4.3 11/30/2017   CL 105 11/30/2017   CO2 29 11/30/2017   GLUCOSE 128 (H) 11/30/2017   BUN 23 (H) 11/30/2017   CREATININE 0.74 11/30/2017   CALCIUM 9.4 11/30/2017   MG 2.2 11/02/2016   Lab Results  Component Value Date   INR 1.02 11/03/2016   Lab Results  Component Value Date   CHOL 232 (H) 11/03/2016   HDL 74 11/03/2016   LDLCALC 145 (H) 11/03/2016   TRIG 63 11/03/2016     GEN- The patient is well appearing, alert and oriented x 3 today.   Head- normocephalic, atraumatic Eyes-  Sclera clear, conjunctiva pink Ears- hearing intact Oropharynx- clear Neck- supple, no JVP Lymph- no cervical lymphadenopathy Lungs- Clear to ausculation bilaterally, normal work of breathing Heart- Regular rate and rhythm, no murmurs, rubs or gallops, PMI not laterally displaced GI- soft, NT, ND, + BS Extremities- no clubbing, cyanosis, or edema MS- no significant deformity or atrophy Skin- no rash or lesion Psych- euthymic mood, full affect Neuro- strength and sensation are intact  EKG-NSR at 64 bpm, PR int 144 ms, qrs int 72 ms, qtc 431 ms Epic records reviewed    Assessment and Plan: 1. Paroxysmal afib Currently quiet Continue metoprolol, suppose to be 25 mg bid, pt is only  taking 25 mg in am, suggested to take 1/2 tab bid to increase 24 hour coverage, especially with upcoming surgery, she should continue perioperatively to discourage afib at  time of surgery Low   CHA2DS2VASc score, does not require anticoagulation at this  time  F/u with Dr. Rayann Heman in 6 months, pt was a pt of Dr. Caryl Comes, wants to stay with Dr. Lawrence Marse[place] C. Juliahna Wiswell, [facility] [address] [place], [place] [address] [phone]

## 2018-05-05 ENCOUNTER — Ambulatory Visit: Payer: BLUE CROSS/BLUE SHIELD | Attending: Plastic Surgery | Admitting: Physical Therapy

## 2018-05-05 DIAGNOSIS — R262 Difficulty in walking, not elsewhere classified: Secondary | ICD-10-CM

## 2018-05-05 DIAGNOSIS — Z483 Aftercare following surgery for neoplasm: Secondary | ICD-10-CM | POA: Diagnosis not present

## 2018-05-05 DIAGNOSIS — R293 Abnormal posture: Secondary | ICD-10-CM

## 2018-05-05 DIAGNOSIS — I890 Lymphedema, not elsewhere classified: Secondary | ICD-10-CM | POA: Insufficient documentation

## 2018-05-05 NOTE — Therapy (Signed)
[place], [place], [address] Phone: [phone]   Fax:  [phone]  Physical Therapy Evaluation  Patient Details  Name: Linda Foster MRN: [id number] Date of Birth: 1955-09-20 Referring Provider: Kermit Balo PA   Encounter Date: 05/05/2018  PT End of Session - 05/05/18 1108    Visit Number  1    Number of Visits  9    Date for PT Re-Evaluation  06/09/18   pt will start a week late as she is going on vacation next week   PT Start Time  0940    PT Stop Time  1020    PT Time Calculation (min)  40 min    Activity Tolerance  Patient tolerated treatment well    Behavior During Therapy  [facility] for tasks assessed/performed       Past Medical History:  Diagnosis Date  . A-fib ([place])   . Breast cancer ([place]) 2008   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  . Breast cancer ([place]) 2019   left ER/PR +  . Family history of prostate cancer   . Lymphedema 2013  . OSA (obstructive sleep apnea)    does not use CPAP  . Osteoarthritis     Past Surgical History:  Procedure Laterality Date  . ABDOMINAL HYSTERECTOMY    . BREAST LUMPECTOMY Left 2008  . BREAST LUMPECTOMY WITH RADIOACTIVE SEED LOCALIZATION Left 12/05/2017   Procedure: BREAST LUMPECTOMY WITH RADIOACTIVE SEED LOCALIZATION;  Surgeon: Rolm Bookbinder, MD;  Location: [facility];  Service: General;  Laterality: Left;  . CESAREAN SECTION      x 3  . KNEE ARTHROSCOPY Right 2007    There were no vitals filed for this visit.   Subjective Assessment - 05/05/18 0948    Subjective  She only has a little bit of trouble leaning forward when she walks.  she is complaining of pain in left axilla with decreased shoulder range fo motion. She has had some problem with lymphedema in her left arm that she has just not done anything about it before. She wants to learn what she needs to do about .     Pertinent History  Left lumpectomy, ALND, chemo, and  radiation in 2008 for triple negative breast cancer. Left lumpectomy 12/05/17 for ER/PR positive breast cancer but neede more surgery.  Pt underwent a mastectomy with immediate DIEP flap on the left  She has longstanding lymphedema in her legs that she has had for many years. most in her left leg     Patient Stated Goals  to improve the left shoulder range of motion and walk straight again and do something about the lymphedema in her left arm     Currently in Pain?  No/denies         [facility] PT Assessment - 05/05/18 0001      Assessment   Medical Diagnosis  mastectomy with immediate DIEP flap     Referring Provider  Kermit Balo PA    Onset Date/Surgical Date  03/07/18    Hand Dominance  Right    Prior Therapy  Baseline assessment      Precautions   Precautions  Other (comment)   no lifting > 5-10 pounds, pushing or pulling    Precaution Comments  Recent cancer surgery; left arm lymphedema      Restrictions   Weight Bearing Restrictions  No      Balance Screen   Has the patient fallen in the past  6 months  No    Has the patient had a decrease in activity level because of a fear of falling?   No    Is the patient reluctant to leave their home because of a fear of falling?   No      Home Film/video editor residence    Living Arrangements  Spouse/significant other    Available Help at Discharge  Family    Additional Comments  with husband with daughter living at the end of the stretch       Prior Function   Level of Independence  Independent    Vocation  Retired    Biomedical scientist  pt keeps her grandchild 52 1/2 years old 66 or 4 days a week, other 6 grandchildren are in an out     Leisure  She reports she is not walking yet for exercise      Cognition   Overall Cognitive Status  Within Functional Limits for tasks assessed      Observation/Other Assessments   Observations  pt has healing incisions across lower abdomen and on left breast mound      Skin Integrity  no open areas    Quick DASH   9.09      Coordination   Gross Motor Movements are Fluid and Coordinated  No   diffiuclty with walking erect, hips and knees in slight flex     Sit to Stand   Comments  17 repetions in 30 seconds. unable to stand fully erect       Posture/Postural Control   Posture/Postural Control  Postural limitations    Postural Limitations  Rounded Shoulders;Forward head      AROM   Overall AROM Comments  bilateral hip flexion contracture     Left Shoulder Extension  42 Degrees    Left Shoulder Flexion  140 Degrees    Left Shoulder ABduction  150 Degrees    Left Shoulder Internal Rotation  50 Degrees   50   Left Shoulder External Rotation  85 Degrees   some pulling at end range    Right Hip Flexion  --   lacks about 30 degrees to neutral with leg over edge of mat    Left Hip Flexion  --   lacks about 30 degrees to neutral with leg over edge of mat      Palpation   Palpation comment  lymphostatic lymphedema in left arm, non pitting. Boggy lymphedema left leg up to to thigh, and to lesser extent in right leg.  Pt wants to get treatment for lymphedema in left arm              Quick Dash - 05/05/18 0001    Open a tight or new jar  No difficulty    Do heavy household chores (wash walls, wash floors)  No difficulty    Carry a shopping bag or briefcase  No difficulty    Wash your back  Mild difficulty    Use a knife to cut food  No difficulty    Recreational activities in which you take some force or impact through your arm, shoulder, or hand (golf, hammering, tennis)  Mild difficulty    During the past week, to what extent has your arm, shoulder or hand problem interfered with your normal social activities with family, friends, neighbors, or groups?  Not at all    During the past week, to what extent has your arm, shoulder  or hand problem limited your work or other regular daily activities  Not at all    Arm, shoulder, or hand pain.  Mild     Tingling (pins and needles) in your arm, shoulder, or hand  Mild    Difficulty Sleeping  No difficulty    DASH Score  9.09 %        Objective measurements completed on examination: See above findings.      [place] [facility] Adult PT Treatment/Exercise - 05/05/18 0001      Lumbar Exercises: Supine   Pelvic Tilt  10 reps   anterior/posterior/ side to side    Bent Knee Raise  10 reps   alternating mini marches      Shoulder Exercises: Supine   Other Supine Exercises  dowel rod stretching into shoulder flexion              PT Education - 05/05/18 1108    Education provided  Yes    Education Details  pelvic tilts , supine dowel stretches     Person(s) Educated  Patient    Methods  Explanation    Comprehension  Verbalized understanding          PT Long Term Goals - 05/05/18 1121      PT LONG TERM GOAL #1   Title  Left shoulder AROM will be 150 degrees demonstrating she has returned to baseline     Time  4    Period  Weeks    Status  New      PT LONG TERM GOAL #2   Title  Pt will report that she has returned to her normal gait without difficulty     Time  4    Period  Weeks    Status  New      PT LONG TERM GOAL #3   Title  Pt will report she is able to manage her left UE lymphedema at home with manual lymph drainage and compression     Time  4    Period  Weeks    Status  New             Plan - 05/05/18 1110    Clinical Impression Statement  Pt presents 2 months after a DIEP flap with difficulty standing erect when she is walking, pain and stiffness in her left shoulder and longstanding lymphedema in her left arm. She also very longstanding lymphedemed in both legs, left > right that has not changed recently and she is not interested in having that treated at this time.  Requested Ok to treatmet left shoulder and lymphedema in left arm from [facility]. PA     History and Personal Factors relevant to plan of care:  previous left breast cancer in 2008 with  treatment including lymph node removal and radiation, untreated lymphedema in left arm     Clinical Presentation  Stable    Clinical Presentation due to:  no further treatment     Clinical Decision Making  Low    Rehab Potential  Excellent    Clinical Impairments Affecting Rehab Potential  Lymphedema in UE  has been present for 10 years without treatment, also lymphedema in both legs    PT Frequency  2x / week    PT Duration  4 weeks   once treatment starts    PT Treatment/Interventions  ADLs/Self Care Home Management    PT Next Visit Plan  continue pelvic mobility and teach transverse abdominal series,  stretch  hip flexors bilaterally, if ok, work on left shoulder ROM and strength.  Measure arm circumference and start lymphedema treatment     PT Home Exercise Plan  pelvic tilts     Consulted and Agree with Plan of Care  Patient       Patient will benefit from skilled therapeutic intervention in order to improve the following deficits and impairments:  Pain, Decreased knowledge of precautions, Impaired UE functional use, Decreased range of motion, Postural dysfunction, Increased fascial restricitons, Decreased knowledge of use of DME, Abnormal gait, Decreased activity tolerance, Difficulty walking, Increased edema, Obesity  Visit Diagnosis: Difficulty in walking, not elsewhere classified  Abnormal posture  Lymphedema, not elsewhere classified  Aftercare following surgery for neoplasm     Problem List Patient Active Problem List   Diagnosis Date Noted  . 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 Family history of prostate cancer 01/02/2018  . Breast cancer, left breast ([place]) 01/02/2018  . Genetic testing 12/15/2017  . Primary malignant neoplasm of lower outer quadrant of female breast, left ([place]) 12/14/2017  . History of therapeutic radiation 11/01/2017  . 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 Chest pain with moderate risk  of acute coronary syndrome 11/03/2016  . Atrial fibrillation with rapid ventricular response ([place]) 11/02/2016  . History of invasive breast cancer 11/06/2012  Donato Heinz. Owens Shark PT  Norwood Levo 05/05/2018, 11:27 AM  [place], [place], [address] Phone: [phone]   Fax:  [phone]  Name: Linda Foster MRN: [id number] Date of Birth: May 05, 1956

## 2018-05-05 NOTE — Addendum Note (Signed)
Addended by: Kipp Laurence on: 05/05/2018 11:45 AM   Modules accepted: Orders

## 2018-05-05 NOTE — Therapy (Signed)
[place], [place], [address] Phone: [phone]   Fax:  [phone]  Physical Therapy Evaluation  Patient Details  Name: Linda Foster MRN: [id number] Date of Birth: [birth year] Referring Provider: Kermit Balo PA   Encounter Date: 05/05/2018  PT End of Session - 05/05/18 1108    Visit Number  1    Number of Visits  9    Date for PT Re-Evaluation  06/09/18   pt will start a week late as she is going on vacation next week   PT Start Time  0940    PT Stop Time  1020    PT Time Calculation (min)  40 min    Activity Tolerance  Patient tolerated treatment well    Behavior During Therapy  [facility] for tasks assessed/performed       Past Medical History:  Diagnosis Date  . A-fib ([place])   . 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 Breast cancer ([place]) 2019   left ER/PR +  . Family history of prostate cancer   . Lymphedema 2013  . OSA (obstructive sleep apnea)    does not use CPAP  . Osteoarthritis     Past Surgical History:  Procedure Laterality Date  . ABDOMINAL HYSTERECTOMY    . BREAST LUMPECTOMY Left 2008  . BREAST LUMPECTOMY WITH RADIOACTIVE SEED LOCALIZATION Left 12/05/2017   Procedure: BREAST LUMPECTOMY WITH RADIOACTIVE SEED LOCALIZATION;  Surgeon: Rolm Bookbinder, MD;  Location: [facility];  Service: General;  Laterality: Left;  . CESAREAN SECTION      x 3  . KNEE ARTHROSCOPY Right 2007    There were no vitals filed for this visit.   Subjective Assessment - 05/05/18 0948    Subjective  She only has a little bit of trouble leaning forward when she walks.  she is complaining of pain in left axilla with decreased shoulder range fo motion. She has had some problem with lymphedema in her left arm that she has just not done anything about it before. She wants to learn what she needs to do about .     Pertinent History  Left lumpectomy, ALND, chemo, and  radiation in 2008 for triple negative breast cancer. Left lumpectomy 12/05/17 for ER/PR positive breast cancer but neede more surgery.  Pt underwent a mastectomy with immediate DIEP flap on the left  She has longstanding lymphedema in her legs that she has had for many years. most in her left leg     Patient Stated Goals  to improve the left shoulder range of motion and walk straight again and do something about the lymphedema in her left arm     Currently in Pain?  No/denies         [facility] PT Assessment - 05/05/18 0001      Assessment   Medical Diagnosis  mastectomy with immediate DIEP flap     Referring Provider  Kermit Balo PA    Onset Date/Surgical Date  03/07/18    Hand Dominance  Right    Prior Therapy  Baseline assessment      Precautions   Precautions  Other (comment)   no lifting > 5-10 pounds, pushing or pulling    Precaution Comments  Recent cancer surgery; left arm lymphedema      Restrictions   Weight Bearing Restrictions  No      Balance Screen   Has the patient fallen in the past  6 months  No    Has the patient had a decrease in activity level because of a fear of falling?   No    Is the patient reluctant to leave their home because of a fear of falling?   No      Home Film/video editor residence    Living Arrangements  Spouse/significant other    Available Help at Discharge  Family    Additional Comments  with husband with daughter living at the end of the street      Prior Function   Level of Independence  Independent    Vocation  Retired    Biomedical scientist  pt keeps her grandchild 33 1/2 years old 31 or 4 days a week, other 6 grandchildren are in an out     Leisure  She reports she is not walking yet for exercise      Cognition   Overall Cognitive Status  Within Functional Limits for tasks assessed      Observation/Other Assessments   Observations  pt has healing incisions across lower abdomen and on left breast mound      Skin Integrity  no open areas    Quick DASH   9.09      Coordination   Gross Motor Movements are Fluid and Coordinated  No   diffiuclty with walking erect, hips and knees in slight flex     Sit to Stand   Comments  17 repetions in 30 seconds. unable to stand fully erect       Posture/Postural Control   Posture/Postural Control  Postural limitations    Postural Limitations  Rounded Shoulders;Forward head      AROM   Overall AROM Comments  bilateral hip flexion contracture     Left Shoulder Extension  42 Degrees    Left Shoulder Flexion  140 Degrees    Left Shoulder ABduction  150 Degrees    Left Shoulder Internal Rotation  50 Degrees   50   Left Shoulder External Rotation  85 Degrees   some pulling at end range    Right Hip Flexion  --   lacks about 30 degrees to neutral with leg over edge of mat    Left Hip Flexion  --   lacks about 30 degrees to neutral with leg over edge of mat      Palpation   Palpation comment  lymphostatic lymphedema in left arm, non pitting. Boggy lymphedema left leg up to to thigh, and to lesser extent in right leg.  Pt wants to get treatment for lymphedema in left arm              Quick Dash - 05/05/18 0001    Open a tight or new jar  No difficulty    Do heavy household chores (wash walls, wash floors)  No difficulty    Carry a shopping bag or briefcase  No difficulty    Wash your back  Mild difficulty    Use a knife to cut food  No difficulty    Recreational activities in which you take some force or impact through your arm, shoulder, or hand (golf, hammering, tennis)  Mild difficulty    During the past week, to what extent has your arm, shoulder or hand problem interfered with your normal social activities with family, friends, neighbors, or groups?  Not at all    During the past week, to what extent has your arm, shoulder or  hand problem limited your work or other regular daily activities  Not at all    Arm, shoulder, or hand pain.  Mild     Tingling (pins and needles) in your arm, shoulder, or hand  Mild    Difficulty Sleeping  No difficulty    DASH Score  9.09 %        Objective measurements completed on examination: See above findings.      [facility] Adult PT Treatment/Exercise - 05/05/18 0001      Lumbar Exercises: Supine   Pelvic Tilt  10 reps   anterior/posterior/ side to side    Bent Knee Raise  10 reps   alternating mini marches      Shoulder Exercises: Supine   Other Supine Exercises  dowel rod stretching into shoulder flexion              PT Education - 05/05/18 1108    Education provided  Yes    Education Details  pelvic tilts , supine dowel stretches     Person(s) Educated  Patient    Methods  Explanation    Comprehension  Verbalized understanding          PT Long Term Goals - 05/05/18 1121      PT LONG TERM GOAL #1   Title  Left shoulder AROM will be 150 degrees demonstrating she has returned to baseline     Time  4    Period  Weeks    Status  New      PT LONG TERM GOAL #2   Title  Pt will report that she has returned to her normal gait without difficulty     Time  4    Period  Weeks    Status  New      PT LONG TERM GOAL #3   Title  Pt will report she is able to manage her left UE lymphedema at home with manual lymph drainage and compression     Time  4    Period  Weeks    Status  New             Plan - 05/05/18 1110    Clinical Impression Statement  Pt presents 2 months after a DIEP flap with difficulty standing erect when she is walking, pain and stiffness in her left shoulder and longstanding lymphedema in her left arm. She also very longstanding lymphedemed in both legs, left > right that has not changed recently and she is not interested in having that treated at this time.  Requested Ok to treatmet left shoulder and lymphedema in left arm from [facility]. PA     History and Personal Factors relevant to plan of care:  previous left breast cancer in 2008 with  treatment including lymph node removal and radiation, untreated lymphedema in left arm     Clinical Presentation  Stable    Clinical Presentation due to:  no further treatment     Clinical Decision Making  Low    Rehab Potential  Excellent    Clinical Impairments Affecting Rehab Potential  Lymphedema in UE  has been present for 10 years without treatment, also lymphedema in both legs    PT Frequency  2x / week    PT Duration  4 weeks   once treatment starts    PT Treatment/Interventions  ADLs/Self Care Home Management    PT Next Visit Plan  continue pelvic mobility and teach transverse abdominal series,  stretch hip  flexors bilaterally, if ok, work on left shoulder ROM and strength.  Measure arm circumference and start lymphedema treatment     PT Home Exercise Plan  pelvic tilts     Consulted and Agree with Plan of Care  Patient       Patient will benefit from skilled therapeutic intervention in order to improve the following deficits and impairments:  Pain, Decreased knowledge of precautions, Impaired UE functional use, Decreased range of motion, Postural dysfunction, Increased fascial restricitons, Decreased knowledge of use of DME, Abnormal gait, Decreased activity tolerance, Difficulty walking, Increased edema, Obesity  Visit Diagnosis: Difficulty in walking, not elsewhere classified  Abnormal posture  Lymphedema, not elsewhere classified  Aftercare following surgery for neoplasm     Problem List Patient Active Problem List   Diagnosis Date Noted  . 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 Family history of prostate cancer 01/02/2018  . Breast cancer, left breast ([place]) 01/02/2018  . Genetic testing 12/15/2017  . Primary malignant neoplasm of lower outer quadrant of female breast, left ([place]) 12/14/2017  . History of therapeutic radiation 11/01/2017  . 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 Chest pain with moderate risk  of acute coronary syndrome 11/03/2016  . Atrial fibrillation with rapid ventricular response ([place]) 11/02/2016  . History of invasive breast cancer 11/06/2012  Donato Heinz. Owens Shark PT  Norwood Levo 05/05/2018, 11:30 AM  [place], [place], [address] Phone: [phone]   Fax:  [phone]  Name: Linda Foster MRN: [id number] Date of Birth: 1956-08-11

## 2018-05-16 ENCOUNTER — Ambulatory Visit: Payer: BLUE CROSS/BLUE SHIELD | Attending: Plastic Surgery | Admitting: Physical Therapy

## 2018-05-16 DIAGNOSIS — R262 Difficulty in walking, not elsewhere classified: Secondary | ICD-10-CM | POA: Insufficient documentation

## 2018-05-16 DIAGNOSIS — R293 Abnormal posture: Secondary | ICD-10-CM | POA: Insufficient documentation

## 2018-05-16 DIAGNOSIS — Z483 Aftercare following surgery for neoplasm: Secondary | ICD-10-CM | POA: Insufficient documentation

## 2018-05-16 DIAGNOSIS — I890 Lymphedema, not elsewhere classified: Secondary | ICD-10-CM | POA: Insufficient documentation

## 2018-05-18 ENCOUNTER — Ambulatory Visit: Payer: BLUE CROSS/BLUE SHIELD | Admitting: Physical Therapy

## 2018-05-18 ENCOUNTER — Encounter: Payer: Self-pay | Admitting: Physical Therapy

## 2018-05-18 DIAGNOSIS — R262 Difficulty in walking, not elsewhere classified: Secondary | ICD-10-CM | POA: Diagnosis not present

## 2018-05-18 DIAGNOSIS — Z483 Aftercare following surgery for neoplasm: Secondary | ICD-10-CM | POA: Diagnosis not present

## 2018-05-18 DIAGNOSIS — R293 Abnormal posture: Secondary | ICD-10-CM

## 2018-05-18 DIAGNOSIS — I890 Lymphedema, not elsewhere classified: Secondary | ICD-10-CM | POA: Diagnosis not present

## 2018-05-18 NOTE — Therapy (Signed)
[place], [place], [address] Phone: [phone]   Fax:  [phone]  Physical Therapy Treatment  Patient Details  Name: Linda Foster MRN: [id number] Date of Birth: [birth year] Referring Provider: Wonda Cheng    Encounter Date: 05/18/2018  PT End of Session - 05/18/18 1221    Visit Number  2    Number of Visits  9    Date for PT Re-Evaluation  06/09/18    PT Start Time  1100    PT Stop Time  1145    PT Time Calculation (min)  45 min    Activity Tolerance  Patient tolerated treatment well    Behavior During Therapy  [facility] for tasks assessed/performed       Past Medical History:  Diagnosis Date  . A-fib ([place])   . 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 Breast cancer ([place]) 2019   left ER/PR +  . Family history of prostate cancer   . Lymphedema 2013  . OSA (obstructive sleep apnea)    does not use CPAP  . Osteoarthritis     Past Surgical History:  Procedure Laterality Date  . ABDOMINAL HYSTERECTOMY    . BREAST LUMPECTOMY Left 2008  . BREAST LUMPECTOMY WITH RADIOACTIVE SEED LOCALIZATION Left 12/05/2017   Procedure: BREAST LUMPECTOMY WITH RADIOACTIVE SEED LOCALIZATION;  Surgeon: Rolm Bookbinder, MD;  Location: [facility];  Service: General;  Laterality: Left;  . CESAREAN SECTION      x 3  . KNEE ARTHROSCOPY Right 2007    There were no vitals filed for this visit.                    [facility] Adult PT Treatment/Exercise - 05/18/18 0001      Self-Care   Self-Care  Other Self-Care Comments    Other Self-Care Comments   talked about options to get compression sleeve and difference between flat and circular knit. Gave pt prescription for sleeve       Neck Exercises: Seated   Other Seated Exercise  neck and upper thoracic ROM with lymphatic flow series.  gave pt link so she could watch it on youtube       Lumbar Exercises: Supine   Pelvic Tilt  10 reps    Pelvic Tilt Limitations  also 10 reps of pelvic tilt with perineal pull ups     Glut Set  10 reps    Glut Set Limitations  in all positions for glute strength and to stretch hip flexors     Clam  10 reps    Clam Limitations  cues to stabalize core    Heel Slides  10 reps    Bent Knee Raise  10 reps      Knee/Hip Exercises: Stretches   Hip Flexor Stretch  Left;4 reps    Hip Flexor Stretch Limitations  showed pt mulitple options.  She was unable to  tolerate dropping leg off the side of the mat due to pain. so used folded up towel under pelvis with right leg bent up and left leg exetended out to stretch anterior hip  Also did sitting sideways in chair for hip and thigh stretch and standing at counter with split stance with pelvic tuck to stretch anterior hip, glute sets with pelvic tilt.              PT Education - 05/18/18 1221    Education provided  Yes    Education Details  transverse abdominal series, hip flexor stretches     Person(s) Educated  Patient    Methods  Explanation;Handout    Comprehension  Verbalized understanding;Returned demonstration          PT Long Term Goals - 05/05/18 1121      PT LONG TERM GOAL #1   Title  Left shoulder AROM will be 150 degrees demonstrating she has returned to baseline     Time  4    Period  Weeks    Status  New      PT LONG TERM GOAL #2   Title  Pt will report that she has returned to her normal gait without difficulty     Time  4    Period  Weeks    Status  New      PT LONG TERM GOAL #3   Title  Pt will report she is able to manage her left UE lymphedema at home with manual lymph drainage and compression     Time  4    Period  Weeks    Status  New            Plan - 05/18/18 1222    Clinical Impression Statement  Pt appears to be making gains, but still feels that she is having difficulty walking erect.  Worked on transvers abdominal exercise and hip flexor stretching today Pt did well with  exercises     Clinical Impairments Affecting Rehab Potential  Lymphedema in UE  has been present for 10 years without treatment, also lymphedema in both legs    PT Frequency  2x / week    PT Duration  4 weeks    PT Next Visit Plan  Review  transverse abdominal series,  stretch hip flexors bilaterally, progress exercise to sidleying and prone if tolerated  if ok, work on left shoulder ROM and strength.  Measure arm circumference and start lymphedema treatment     PT Home Exercise Plan  pelvic tilts , transverse abdominal series     Consulted and Agree with Plan of Care  Patient       Patient will benefit from skilled therapeutic intervention in order to improve the following deficits and impairments:  Pain, Decreased knowledge of precautions, Impaired UE functional use, Decreased range of motion, Postural dysfunction, Increased fascial restricitons, Decreased knowledge of use of DME, Abnormal gait, Decreased activity tolerance, Difficulty walking, Increased edema, Obesity  Visit Diagnosis: Difficulty in walking, not elsewhere classified  Abnormal posture  Lymphedema, not elsewhere classified  Aftercare following surgery for neoplasm     Problem List Patient Active Problem List   Diagnosis Date Noted  . 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 Family history of prostate cancer 01/02/2018  . Breast cancer, left breast ([place]) 01/02/2018  . Genetic testing 12/15/2017  . Primary malignant neoplasm of lower outer quadrant of female breast, left ([place]) 12/14/2017  . History of therapeutic radiation 11/01/2017  . 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 Chest pain with moderate risk of acute coronary syndrome 11/03/2016  . Atrial fibrillation with rapid ventricular response ([place]) 11/02/2016  . History of invasive breast cancer 11/06/2012   Donato Heinz. Owens Shark PT  Norwood Levo 05/18/2018, 12:24 PM  [place], [place], [address] Phone: [phone]   Fax:  [phone]  Name: Linda Foster MRN: [id number] Date  of Birth: 06/16/1956

## 2018-05-18 NOTE — Patient Instructions (Addendum)
  PELVIC TILT  Lie on back, legs bent. Exhale, tilting top of pelvis back, pubic bone up, to flatten lower back. Inhale, rolling pelvis opposite way, top forward, pubic bone down, arch in back. Repeat __10__ times. Do __2__ sessions per day. Copyright  VHI. All rights reserved.     Lie with hips and knees bent. Slowly inhale, and then exhale. Pull navel toward spine and tighten pelvic floor. Hold for __10_ seconds. Continue to breathe in and out during hold. Rest for _10__ seconds. Repeat __10_ times. Do __2-3_ times a day.   Copyright  VHI. All rights reserved.  Knee Fold   Lie on back, legs bent, arms by sides. Exhale, lifting knee to chest. Inhale, returning. Keep abdominals flat, navel to spine. Repeat __10__ times, alternating legs. Do __2__ sessions per day.  Copyright  VHI. All rights reserved.  Knee Drop   Keep pelvis stable. Without rotating hips, slowly drop knee to side, pause, return to center, bring knee across midline toward opposite hip. Feel obliques engaging. Repeat for ___10_ times each leg.  Copyright  VHI. All rights reserved.  Isometric Hold With Pelvic Floor (Hook-Lying)    Copyright  VHI. All rights reserved.  Heel Slide to Straight   Slide one leg down to straight. Return. Be sure pelvis does not rock forward, tilt, rotate, or tip to side. Do _10__ times. Restabilize pelvis. Repeat with other leg. Do __1-2_ sets, __2_ times per day.  http://ss.exer.us/16   Copyright  VHI. All rights reserved.   Www.youtube.com Lymphatic flow series with Shoosh Lettick Crotzer      First of all, check with your insurance company to see if provider is in [place] (for wigs and compression sleeves / gloves/gauntlets )  [place], [place] [address] [phone]  Will file some insurances --- call for appointment   Second to [facility] (for mastectomy prosthetics and garments) [place], [place] [address] [phone] Will  file some insurances --- call for appointment  [facility]  [address]  [place], [place] [address] [phone] Lower extremity garments  [facility] [address] [place], [place]  [address] [place] ( Medicaid certified lymphedema fitter) [phone] [email or web address]  [employer] [place] [place], [place] [address] [phone]  Other Resources: National Lymphedema Network:  www.lymphnet.org www.Klosetraining.com for patient articles and self manual lymph drainage information www.lymphedemablog.com has informative articles.  DishTag.es.com www.lymphedemaproducts.com www.[email or web address]

## 2018-05-22 DIAGNOSIS — C50911 Malignant neoplasm of unspecified site of right female breast: Secondary | ICD-10-CM | POA: Diagnosis not present

## 2018-05-23 ENCOUNTER — Ambulatory Visit: Payer: BLUE CROSS/BLUE SHIELD

## 2018-05-23 DIAGNOSIS — R293 Abnormal posture: Secondary | ICD-10-CM

## 2018-05-23 DIAGNOSIS — Z483 Aftercare following surgery for neoplasm: Secondary | ICD-10-CM

## 2018-05-23 DIAGNOSIS — R262 Difficulty in walking, not elsewhere classified: Secondary | ICD-10-CM | POA: Diagnosis not present

## 2018-05-23 DIAGNOSIS — I890 Lymphedema, not elsewhere classified: Secondary | ICD-10-CM | POA: Diagnosis not present

## 2018-05-23 NOTE — Therapy (Signed)
[place], [place], [address] Phone: [phone]   Fax:  [phone]  Physical Therapy Treatment  Patient Details  Name: Linda Foster MRN: [id number] Date of Birth: 27-Oct-1955 Referring Provider: Wonda Cheng    Encounter Date: 05/23/2018  PT End of Session - 05/23/18 1130    Visit Number  3    Number of Visits  9    Date for PT Re-Evaluation  06/09/18    PT Start Time  1016    PT Stop Time  1111    PT Time Calculation (min)  55 min    Activity Tolerance  Patient tolerated treatment well    Behavior During Therapy  [facility] for tasks assessed/performed       Past Medical History:  Diagnosis Date  . A-fib ([place])   . Breast cancer ([place]) 2008   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  . Breast cancer ([place]) 2019   left ER/PR +  . Family history of prostate cancer   . Lymphedema 2013  . OSA (obstructive sleep apnea)    does not use CPAP  . Osteoarthritis     Past Surgical History:  Procedure Laterality Date  . ABDOMINAL HYSTERECTOMY    . BREAST LUMPECTOMY Left 2008  . BREAST LUMPECTOMY WITH RADIOACTIVE SEED LOCALIZATION Left 12/05/2017   Procedure: BREAST LUMPECTOMY WITH RADIOACTIVE SEED LOCALIZATION;  Surgeon: Rolm Bookbinder, MD;  Location: [facility];  Service: General;  Laterality: Left;  . CESAREAN SECTION      x 3  . KNEE ARTHROSCOPY Right 2007    There were no vitals filed for this visit.  Subjective Assessment - 05/23/18 1021    Subjective  I've only got to do my exercises about 2 times since I was here last and they are going well. I did walk alot yesterday and they was okay but I did have some sharp pains in my Rt calf towards the end of the day (walked at [facility] for appts and then took grandkids to the fair). It feels better today. I think I'm ready to address my lymphedema.     Pertinent History  Left lumpectomy, ALND, chemo, and radiation in 2008 for triple negative breast  cancer. Left lumpectomy 12/05/17 for ER/PR positive breast cancer but neede more surgery.  Pt underwent a mastectomy with immediate DIEP flap on the left  She has longstanding lymphedema in her legs that she has had for many years. most in her left leg     Patient Stated Goals  to improve the left shoulder range of motion and walk straight again and do something about the lymphedema in her left arm     Currently in Pain?  No/denies            LYMPHEDEMA/ONCOLOGY QUESTIONNAIRE - 05/23/18 1023      Lymphedema Assessments   Lymphedema Assessments  Upper extremities      Right Upper Extremity Lymphedema   15 cm Proximal to Olecranon Process  33.5 cm    10 cm Proximal to Olecranon Process  32.6 cm    Olecranon Process  28.4 cm    15 cm Proximal to Ulnar Styloid Process  27.2 cm    10 cm Proximal to Ulnar Styloid Process  24.4 cm    Just Proximal to Ulnar Styloid Process  17.3 cm    Across Hand at [employer]  19.1 cm    At [place] of 2nd Digit  6.6 cm  Left Upper Extremity Lymphedema   15 cm Proximal to Olecranon Process  33.8 cm    10 cm Proximal to Olecranon Process  33.2 cm    Olecranon Process  30.5 cm    15 cm Proximal to Ulnar Styloid Process  27.2 cm    10 cm Proximal to Ulnar Styloid Process  24 cm    Just Proximal to Ulnar Styloid Process  19.4 cm    Across Hand at [employer]  19.9 cm    At [place] of 2nd Digit  6.9 cm                [facility] Adult PT Treatment/Exercise - 05/23/18 0001      Manual Therapy   Manual Therapy  Manual Lymphatic Drainage (MLD)    Manual Lymphatic Drainage (MLD)  In supine and instructing pt throughout with having her return demonstration and hand over hand technique used for correct pressure and direction of stretch/technique: Short neck, 5 diaphragmatic breaths, Lt inguinal and Rt axillary nodes, Lt axillo-inguinal and anterior inter-axillary anatsomosis, then Lt UE from lateral upper arm to dorsal hand working from proximal to  distal then retracing all steps.              PT Education - 05/23/18 1126    Education provided  Yes    Education Details  Self manual lymph drainage    Person(s) Educated  Patient    Methods  Explanation;Demonstration;Handout    Comprehension  Verbalized understanding;Returned demonstration;Need further instruction          PT Long Term Goals - 05/05/18 1121      PT LONG TERM GOAL #1   Title  Left shoulder AROM will be 150 degrees demonstrating she has returned to baseline     Time  4    Period  Weeks    Status  New      PT LONG TERM GOAL #2   Title  Pt will report that she has returned to her normal gait without difficulty     Time  4    Period  Weeks    Status  New      PT LONG TERM GOAL #3   Title  Pt will report she is able to manage her left UE lymphedema at home with manual lymph drainage and compression     Time  4    Period  Weeks    Status  New            Plan - 05/23/18 1131    Clinical Impression Statement  Pt reported feeling good about exercises issued to her at last visit and would like to begin focusing on her lymphedema today. Took inital circumferential measurements of bil UE's and Lt is increased comparitively at hand/wrist and elbow. After discussing options with pt regarding bandaging and manual lymph drainage and seeing how her swelling is right at or less than 2 cm difference that it would be reasonable to try self MLD first if pt will be compliant with this and perform this 2-3x/day for a week or 2 to work on attaining reductions. if so then she can measured for compression. Pt was very agreeable to this instead of bandaging so she was instructed in manual lymph drainage today. With tactile and VCs offered throughout pt did reasonably well but will benefit from further reinforcement of technique.     Rehab Potential  Excellent    Clinical Impairments Affecting Rehab Potential  Lymphedema  in UE  has been present for 10 years without  treatment, also lymphedema in both legs    PT Frequency  2x / week    PT Duration  4 weeks    PT Treatment/Interventions  ADLs/Self Care Home Management    PT Next Visit Plan  Review self MLD with pt and possibly remeasure circumference again. Review any recently issued HEP prn, though overall pt feels she is doing well.     PT Home Exercise Plan  pelvic tilts , transverse abdominal series ; self MLD    Consulted and Agree with Plan of Care  Patient       Patient will benefit from skilled therapeutic intervention in order to improve the following deficits and impairments:  Pain, Decreased knowledge of precautions, Impaired UE functional use, Decreased range of motion, Postural dysfunction, Increased fascial restricitons, Decreased knowledge of use of DME, Abnormal gait, Decreased activity tolerance, Difficulty walking, Increased edema, Obesity  Visit Diagnosis: Difficulty in walking, not elsewhere classified  Abnormal posture  Lymphedema, not elsewhere classified  Aftercare following surgery for neoplasm     Problem List Patient Active Problem List   Diagnosis Date Noted  . 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 Family history of prostate cancer 01/02/2018  . Breast cancer, left breast ([place]) 01/02/2018  . Genetic testing 12/15/2017  . Primary malignant neoplasm of lower outer quadrant of female breast, left ([place]) 12/14/2017  . History of therapeutic radiation 11/01/2017  . 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 Chest pain with moderate risk of acute coronary syndrome 11/03/2016  . Atrial fibrillation with rapid ventricular response ([place]) 11/02/2016  . History of invasive breast cancer 11/06/2012    Otelia Limes, PTA 05/23/2018, 11:42 AM  [place], [place], [address] Phone: [phone]   Fax:  [phone]  Name: Linda Foster MRN: [id number] Date of Birth: 1956-02-12

## 2018-05-23 NOTE — Patient Instructions (Signed)
Start with circles near the neck, above collarbones 5-10 times each side.   Cancer Rehab [phone] Deep Effective Breath   Standing, sitting, or laying down, place both hands on the belly. Take a deep breath IN, expanding the belly; then breath OUT, contracting the belly. Repeat __5__ times. Do __2-3__ sessions per day and before your self massage.  Axilla to Axilla - Sweep   On uninvolved side make 5 circles in the armpit, then pump _5__ times from involved armpit across chest to uninvolved armpit, making a pathway. Do _1__ time per day.  Copyright  VHI. All rights reserved.  Axilla to Inguinal Nodes - Sweep   On involved side, make 5 circles at groin at panty line, then pump _5__ times from armpit along side of trunk to outer hip, making your other pathway. Do __1_ time per day.  Copyright  VHI. All rights reserved.  Arm Posterior: Elbow to Shoulder - Sweep   Pump _5__ times from back of elbow to top of shoulder. Then inner to outer upper arm _5_ times, then outer arm again _5_ times. Then back to the pathways _2-3_ times. Do _1__ time per day.  Copyright  VHI. All rights reserved.  ARM: [place] Wrist to Elbow - Sweep   Pump or stationary circles _5__ times from wrist to elbow making sure to do both sides of the forearm. Then retrace your steps to the outer upper arm, and the pathways _2-3_ times each. Do _1__ time per day.  Copyright  VHI. All rights reserved.  ARM: Dorsum of Hand to Shoulder - Sweep   Pump or stationary circles _5__ times on back of hand including knuckle spaces and individual fingers if needed working up towards the wrist, then retrace all your steps working back up the forearm, doing both sides; upper outer arm and back to your pathways _2-3_ times each. Then do 5 circles again at uninvolved armpit and involved groin where you started! Good job!! Do __1_ time per day.

## 2018-05-25 ENCOUNTER — Ambulatory Visit: Payer: BLUE CROSS/BLUE SHIELD

## 2018-05-25 DIAGNOSIS — Z483 Aftercare following surgery for neoplasm: Secondary | ICD-10-CM | POA: Diagnosis not present

## 2018-05-25 DIAGNOSIS — R293 Abnormal posture: Secondary | ICD-10-CM

## 2018-05-25 DIAGNOSIS — R262 Difficulty in walking, not elsewhere classified: Secondary | ICD-10-CM | POA: Diagnosis not present

## 2018-05-25 DIAGNOSIS — I890 Lymphedema, not elsewhere classified: Secondary | ICD-10-CM | POA: Diagnosis not present

## 2018-05-25 NOTE — Therapy (Signed)
[place], [place], [address] Phone: [phone]   Fax:  [phone]  Physical Therapy Treatment  Patient Details  Name: Linda Foster MRN: [id number] Date of Birth: 1956-09-07 Referring Provider: Wonda Cheng    Encounter Date: 05/25/2018  PT End of Session - 05/25/18 1108    Visit Number  4    Number of Visits  9    Date for PT Re-Evaluation  06/09/18    PT Start Time  1020    PT Stop Time  1104    PT Time Calculation (min)  44 min    Activity Tolerance  Patient tolerated treatment well    Behavior During Therapy  [facility] for tasks assessed/performed       Past Medical History:  Diagnosis Date  . A-fib ([place])   . Breast cancer ([place]) 2008   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  . Breast cancer ([place]) 2019   left ER/PR +  . Family history of prostate cancer   . Lymphedema 2013  . OSA (obstructive sleep apnea)    does not use CPAP  . Osteoarthritis     Past Surgical History:  Procedure Laterality Date  . ABDOMINAL HYSTERECTOMY    . BREAST LUMPECTOMY Left 2008  . BREAST LUMPECTOMY WITH RADIOACTIVE SEED LOCALIZATION Left 12/05/2017   Procedure: BREAST LUMPECTOMY WITH RADIOACTIVE SEED LOCALIZATION;  Surgeon: Rolm Bookbinder, MD;  Location: [facility];  Service: General;  Laterality: Left;  . CESAREAN SECTION      x 3  . KNEE ARTHROSCOPY Right 2007    There were no vitals filed for this visit.  Subjective Assessment - 05/25/18 1023    Subjective  I've done the self manual lymph drainage a few times since I was here last and I think I'm doing okay but I'm not really sure so want you to do it today. I want to just focus on my lymphedema now as I feel my legs and walking is much better.     Pertinent History  Left lumpectomy, ALND, chemo, and radiation in 2008 for triple negative breast cancer. Left lumpectomy 12/05/17 for ER/PR positive breast cancer but neede more surgery.  Pt  underwent a mastectomy with immediate DIEP flap on the left  She has longstanding lymphedema in her legs that she has had for many years. most in her left leg     Patient Stated Goals  to improve the left shoulder range of motion and walk straight again and do something about the lymphedema in her left arm     Currently in Pain?  No/denies                       [facility] Adult PT Treatment/Exercise - 05/25/18 0001      Manual Therapy   Manual Lymphatic Drainage (MLD)  In supine and instructing pt throughout with having her return demonstration and hand over hand technique used for correct pressure and direction of stretch/technique: Short neck, 5 diaphragmatic breaths, Lt inguinal and Rt axillary nodes, Lt axillo-inguinal and anterior inter-axillary anatsomosis, then Lt UE from lateral upper arm to dorsal hand working from proximal to distal                   PT Long Term Goals - 05/05/18 1121      PT LONG TERM GOAL #1   Title  Left shoulder AROM will be 150 degrees demonstrating she has returned to  baseline     Time  4    Period  Weeks    Status  New      PT LONG TERM GOAL #2   Title  Pt will report that she has returned to her normal gait without difficulty     Time  4    Period  Weeks    Status  New      PT LONG TERM GOAL #3   Title  Pt will report she is able to manage her left UE lymphedema at home with manual lymph drainage and compression     Time  4    Period  Weeks    Status  New            Plan - 05/25/18 1208    Clinical Impression Statement  Continued withmanual lymph draiange with pt today reviewing proper pressure throughout. She reports noting how she has can lessen her pressure and review today was beneficial. She plans to cont working on this over weekend.     Rehab Potential  Excellent    Clinical Impairments Affecting Rehab Potential  Lymphedema in UE  has been present for 10 years without treatment, also lymphedema in both legs     PT Frequency  2x / week    PT Duration  4 weeks    PT Treatment/Interventions  ADLs/Self Care Home Management;Therapeutic exercise;Manual lymph drainage;Orthotic Fit/Training;Patient/family education;Manual techniques    PT Next Visit Plan  Remeasure circumference and if pt reducing have her get measured for a flat knit sleeve and glove and Tribute nighttime garment with powersleeve (??bil knee high flat knit??)     Consulted and Agree with Plan of Care  Patient       Patient will benefit from skilled therapeutic intervention in order to improve the following deficits and impairments:  Pain, Decreased knowledge of precautions, Impaired UE functional use, Decreased range of motion, Postural dysfunction, Increased fascial restricitons, Decreased knowledge of use of DME, Abnormal gait, Decreased activity tolerance, Difficulty walking, Increased edema, Obesity  Visit Diagnosis: Difficulty in walking, not elsewhere classified  Abnormal posture  Lymphedema, not elsewhere classified  Aftercare following surgery for neoplasm     Problem List Patient Active Problem List   Diagnosis Date Noted  . 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 Family history of prostate cancer 01/02/2018  . Breast cancer, left breast ([place]) 01/02/2018  . Genetic testing 12/15/2017  . Primary malignant neoplasm of lower outer quadrant of female breast, left ([place]) 12/14/2017  . History of therapeutic radiation 11/01/2017  . 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 Chest pain with moderate risk of acute coronary syndrome 11/03/2016  . Atrial fibrillation with rapid ventricular response ([place]) 11/02/2016  . History of invasive breast cancer 11/06/2012    Otelia Limes, PTA 05/25/2018, 12:23 PM  [place], [place], [address] Phone: [phone]   Fax:  [phone]  Name:  Linda Foster MRN: [id number] Date of Birth: 03/27/1956

## 2018-05-29 ENCOUNTER — Encounter: Payer: Self-pay | Admitting: Oncology

## 2018-05-29 DIAGNOSIS — R922 Inconclusive mammogram: Secondary | ICD-10-CM | POA: Diagnosis not present

## 2018-05-29 DIAGNOSIS — Z853 Personal history of malignant neoplasm of breast: Secondary | ICD-10-CM | POA: Diagnosis not present

## 2018-05-30 ENCOUNTER — Ambulatory Visit: Payer: BLUE CROSS/BLUE SHIELD | Admitting: Physical Therapy

## 2018-05-30 DIAGNOSIS — R262 Difficulty in walking, not elsewhere classified: Secondary | ICD-10-CM | POA: Diagnosis not present

## 2018-05-30 DIAGNOSIS — I890 Lymphedema, not elsewhere classified: Secondary | ICD-10-CM

## 2018-05-30 DIAGNOSIS — R293 Abnormal posture: Secondary | ICD-10-CM

## 2018-05-30 DIAGNOSIS — Z483 Aftercare following surgery for neoplasm: Secondary | ICD-10-CM | POA: Diagnosis not present

## 2018-05-30 NOTE — Therapy (Signed)
[place], [place], [address] Phone: [phone]   Fax:  [phone]  Physical Therapy Treatment  Patient Details  Name: Linda Foster MRN: [id number] Date of Birth: 1956/08/10 Referring Provider: Wonda Cheng    Encounter Date: 05/30/2018  PT End of Session - 05/30/18 1106    Visit Number  5    Number of Visits  9    Date for PT Re-Evaluation  06/09/18    PT Start Time  1884    PT Stop Time  1100    PT Time Calculation (min)  45 min    Activity Tolerance  Patient tolerated treatment well    Behavior During Therapy  [facility] for tasks assessed/performed       Past Medical History:  Diagnosis Date  . A-fib ([place])   . Breast cancer ([place]) 2008   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  . Breast cancer ([place]) 2019   left ER/PR +  . Family history of prostate cancer   . Lymphedema 2013  . OSA (obstructive sleep apnea)    does not use CPAP  . Osteoarthritis     Past Surgical History:  Procedure Laterality Date  . ABDOMINAL HYSTERECTOMY    . BREAST LUMPECTOMY Left 2008  . BREAST LUMPECTOMY WITH RADIOACTIVE SEED LOCALIZATION Left 12/05/2017   Procedure: BREAST LUMPECTOMY WITH RADIOACTIVE SEED LOCALIZATION;  Surgeon: Rolm Bookbinder, MD;  Location: [facility];  Service: General;  Laterality: Left;  . CESAREAN SECTION      x 3  . KNEE ARTHROSCOPY Right 2007    There were no vitals filed for this visit.  Subjective Assessment - 05/30/18 1016    Subjective  Pt says she thinks she is doing well and the problems with walking have to do with her knees and not her recent surgery     Pertinent History  Left lumpectomy, ALND, chemo, and radiation in 2008 for triple negative breast cancer. Left lumpectomy 12/05/17 for ER/PR positive breast cancer but neede more surgery.  Pt underwent a mastectomy with immediate DIEP flap on the left  She has longstanding lymphedema in her legs that she has had for  many years. most in her left leg     Patient Stated Goals  to improve the left shoulder range of motion and walk straight again and do something about the lymphedema in her left arm          [facility] PT Assessment - 05/30/18 0001      AROM   Left Shoulder Flexion  154 Degrees        LYMPHEDEMA/ONCOLOGY QUESTIONNAIRE - 05/30/18 1048      Left Upper Extremity Lymphedema   15 cm Proximal to Olecranon Process  35 cm    10 cm Proximal to Olecranon Process  34 cm    Olecranon Process  30 cm    15 cm Proximal to Ulnar Styloid Process  28 cm    10 cm Proximal to Ulnar Styloid Process  24.7 cm    Just Proximal to Ulnar Styloid Process  18.8 cm    Across Hand at [employer]  19.9 cm    At [place] of 2nd Digit  7 cm                [facility] Adult PT Treatment/Exercise - 05/30/18 0001      Exercises   Exercises  Other Exercises    Other Exercises   Pt given information about  [employer] program, free classes at the [facility], and free water classes through the [facility] program       Manual Therapy   Manual Therapy  Edema management    Edema Management  Remeasuared arm.  Recommend that pt get a flat knit sleeve and glove ( off the shelf if possible) tribute night with variable  compression sleeve. Pt given information to contact [facility] or [facility] ( her choice for when she wants to order )                   PT Long Term Goals - 05/30/18 1057      PT LONG TERM GOAL #1   Title  Left shoulder AROM will be 150 degrees demonstrating she has returned to baseline     Status  Achieved      PT LONG TERM GOAL #2   Title  Pt will report that she has returned to her normal gait without difficulty     Status  Achieved      PT LONG TERM GOAL #3   Title  Pt will report she is able to manage her left UE lymphedema at home with manual lymph drainage and compression     Baseline  Pt does not want to bandage. she  knows where to go to get her garments to be measured  and ordered     Status  Achieved            Plan - 05/30/18 1107    Clinical Impression Statement  Pt reports she is doing well with recovery from her recent surgery and feels that the difficulty walking is from arthritis in her knees.  She was encouraged to increase her exercise for general stretngth and lymphatic flow and was given several free community class options.  She has achieved her goals, although she has increased the circimference of her left arm.  She defers bandaging at this time and was given information to get measured for day and nighttime garments should she agree to get them.  She feels that she is ready to discharge from this episode     Rehab Potential  Excellent    Clinical Impairments Affecting Rehab Potential  Lymphedema in UE  has been present for 10 years without treatment, also lymphedema in both legs    PT Treatment/Interventions  ADLs/Self Care Home Management;Therapeutic exercise;Manual lymph drainage;Orthotic Fit/Training;Patient/family education;Manual techniques    PT Next Visit Plan  Discharge     Consulted and Agree with Plan of Care  Patient       Patient will benefit from skilled therapeutic intervention in order to improve the following deficits and impairments:  Pain, Decreased knowledge of precautions, Impaired UE functional use, Decreased range of motion, Postural dysfunction, Increased fascial restricitons, Decreased knowledge of use of DME, Abnormal gait, Decreased activity tolerance, Difficulty walking, Increased edema, Obesity  Visit Diagnosis: Difficulty in walking, not elsewhere classified  Abnormal posture  Lymphedema, not elsewhere classified  Aftercare following surgery for neoplasm     Problem List Patient Active Problem List   Diagnosis Date Noted  . 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 Family history of prostate cancer 01/02/2018  . Breast cancer, left breast ([place]) 01/02/2018  . Genetic  testing 12/15/2017  . Primary malignant neoplasm of lower outer quadrant of female breast, left ([place]) 12/14/2017  . History of therapeutic radiation 11/01/2017  . 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  Chest pain with moderate risk of acute coronary syndrome 11/03/2016  . Atrial fibrillation with rapid ventricular response ([place]) 11/02/2016  . History of invasive breast cancer 11/06/2012   PHYSICAL THERAPY DISCHARGE SUMMARY  Visits from Start of Care: 5  Current functional level related to goals / functional outcomes: As above    Remaining deficits: Lymphedema in left arm    Education / Equipment: Home exercise program, how to get compression garments  Plan: Patient agrees to discharge.  Patient goals were met. Patient is being discharged due to being pleased with the current functional level.  ?????    Donato Heinz. Owens Shark PT  Norwood Levo 05/30/2018, 12:24 PM  [place], [place], [address] Phone: [phone]   Fax:  [phone]  Name: Linda Foster MRN: [id number] Date of Birth: June 01, 1956

## 2018-05-30 NOTE — Patient Instructions (Signed)
First of all, check with your insurance company to see if provider is in network    [facility] (for wigs and compression sleeves / gloves/gauntlets )  [address] [place], [place] [address] [phone]  Will file some insurances --- call for appointment   [facility] (for mastectomy prosthetics and garments) [address] [place], [place] [address] [phone] Will file some insurances --- call for appointment  [facility]  [address]  [place], [place] [address] [phone] Lower extremity garments  [facility] [address] [place], [place]  [address] [phone]  Cathy Rubel ( Medicaid certified lymphedema fitter) [phone] [email or web address]  [facility] [address] [place], [place] [address] [phone]  Other Resources: National Lymphedema Network:  www.lymphnet.org www.Klosetraining.com for patient articles and self manual lymph drainage information www.lymphedemablog.com has informative articles.  www.compressionguru.com [email or web address].com [email or web address] 

## 2018-06-06 ENCOUNTER — Encounter: Payer: BLUE CROSS/BLUE SHIELD | Admitting: Physical Therapy

## 2018-06-08 ENCOUNTER — Encounter: Payer: BLUE CROSS/BLUE SHIELD | Admitting: Physical Therapy

## 2018-06-12 ENCOUNTER — Ambulatory Visit (HOSPITAL_COMMUNITY)
Admission: RE | Admit: 2018-06-12 | Discharge: 2018-06-12 | Disposition: A | Discharge status: Home / Self Care | Payer: BLUE CROSS/BLUE SHIELD | Source: Ambulatory Visit | Attending: Nurse Practitioner | Admitting: Nurse Practitioner

## 2018-06-12 ENCOUNTER — Encounter (HOSPITAL_COMMUNITY): Payer: Self-pay | Admitting: Nurse Practitioner

## 2018-06-12 VITALS — BP 110/74 | HR 70 | Ht 66.0 in | Wt 217.0 lb

## 2018-06-12 DIAGNOSIS — M1990 Unspecified osteoarthritis, unspecified site: Secondary | ICD-10-CM | POA: Diagnosis not present

## 2018-06-12 DIAGNOSIS — I480 Paroxysmal atrial fibrillation: Secondary | ICD-10-CM | POA: Diagnosis not present

## 2018-06-12 DIAGNOSIS — Z853 Personal history of malignant neoplasm of breast: Secondary | ICD-10-CM | POA: Diagnosis not present

## 2018-06-12 DIAGNOSIS — G4733 Obstructive sleep apnea (adult) (pediatric): Secondary | ICD-10-CM | POA: Diagnosis not present

## 2018-06-12 DIAGNOSIS — Z79899 Other long term (current) drug therapy: Secondary | ICD-10-CM | POA: Diagnosis not present

## 2018-06-12 DIAGNOSIS — I4891 Unspecified atrial fibrillation: Secondary | ICD-10-CM | POA: Diagnosis present

## 2018-06-12 NOTE — Progress Notes (Addendum)
Primary Care Physician: Carol Ada, MD Referring Physician:Dr. Allred( previous Dr. Caryl Comes)   Linda Foster is a 62 y.o. female with a h/o paroxysmal afib that is in the afib clinic for episode of afib yesterday. She is in SR today. The episode lasted around 4-5 hours. No known trigger yesterday. This is the first episode that she has noted in some time. Afib has been quiet.  She wanted to be checked today as she is pending  reconstructive surgery, this Wednesday, h/o breast CA , in October 2018.  She is on BB and would recommend continuation of its use perioperatively to prevent break through Afib. Not on anticoagulation with CHA2DS2VASc score of 1.   Today, she denies symptoms of palpitations, chest pain, shortness of breath, orthopnea, PND, lower extremity edema, dizziness, presyncope, syncope, or neurologic sequela. The patient is tolerating medications without difficulties and is otherwise without complaint today.   Past Medical History:  Diagnosis Date  . A-fib ([place])   . 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 Breast cancer ([place]) 2019   left ER/PR +  . Family history of prostate cancer   . Lymphedema 2013  . OSA (obstructive sleep apnea)    does not use CPAP  . Osteoarthritis    Past Surgical History:  Procedure Laterality Date  . ABDOMINAL HYSTERECTOMY    . BREAST LUMPECTOMY Left 2008  . BREAST LUMPECTOMY WITH RADIOACTIVE SEED LOCALIZATION Left 12/05/2017   Procedure: BREAST LUMPECTOMY WITH RADIOACTIVE SEED LOCALIZATION;  Surgeon: Rolm Bookbinder, MD;  Location: [facility];  Service: General;  Laterality: Left;  . CESAREAN SECTION      x 3  . KNEE ARTHROSCOPY Right 2007    Current Outpatient Medications  Medication Sig Dispense Refill  . anastrozole (ARIMIDEX) 1 MG tablet Take 1 tablet (1 mg total) by mouth daily. 90 tablet 4  . B Complex Vitamins (VITAMIN-B COMPLEX PO) Take 2 tablets by mouth daily.    . Cholecalciferol (VITAMIN  D3) 2000 units TABS Take 2,000 Units by mouth daily.    . furosemide (LASIX) 40 MG tablet Take 1 tablet (40 mg total) by mouth daily. 90 tablet 3  . glucosamine-chondroitin 500-400 MG tablet Take 2 tablets by mouth daily.     . metoprolol tartrate (LOPRESSOR) 25 MG tablet Take 0.5 tablets (12.5 mg total) by mouth 2 (two) times daily. (Patient taking differently: Take 25 mg by mouth 2 (two) times daily. ) 60 tablet 11  . MULTIPLE VITAMIN PO Take 2 tablets by mouth daily.    . naproxen (NAPROSYN) 500 MG tablet TAKE 1 TABLET BY MOUTH WITH FOOD OR MILK EVERY 12 HOURS AS NEEDED    . potassium chloride (K-DUR) 10 MEQ tablet Take one tablet (10 meq) by mouth once every other day 45 tablet 3   No current facility-administered medications for this encounter.     No Known Allergies  Social History   Socioeconomic History  . Marital status: Married    Spouse name: Not on file  . Number of children: Not on file  . Years of education: Not on file  . Highest education level: Not on file  Occupational History  . Not on file  Social Needs  . Financial resource strain: Not on file  . Food insecurity:    Worry: Not on file    Inability: Not on file  . Transportation needs:    Medical: Not on file    Non-medical: Not  on file  Tobacco Use  . Smoking status: Never Smoker  . Smokeless tobacco: Never Used  Substance and Sexual Activity  . Alcohol use: No  . Drug use: No  . Sexual activity: Not on file  Lifestyle  . Physical activity:    Days per week: Not on file    Minutes per session: Not on file  . Stress: Not on file  Relationships  . Social connections:    Talks on phone: Not on file    Gets together: Not on file    Attends religious service: Not on file    Active member of club or organization: Not on file    Attends meetings of clubs or organizations: Not on file    Relationship status: Not on file  . Intimate partner violence:    Fear of current or ex partner: Not on file     Emotionally abused: Not on file    Physically abused: Not on file    Forced sexual activity: Not on file  Other Topics Concern  . Not on file  Social History Narrative  . Not on file    Family History  Problem Relation Age of Onset  . Hypertension Mother   . COPD Father   . Prostate cancer Father 34  . Lung cancer Brother 62  . Prostate cancer Brother 28  . Cancer Cousin        type unk dx. >50  . Cancer Cousin 58       type unk  . Cancer Cousin        type and age dx unkown    ROS- All systems are reviewed and negative except as per the HPI above  Physical Exam: Vitals:   06/12/18 1527  BP: 110/74  Pulse: 70  Weight: 98.4 kg  Height: 5\' 6"  (1.676 m)   Wt Readings from Last 3 Encounters:  06/12/18 98.4 kg  04/14/18 92.1 kg  04/06/18 92.5 kg    Labs: Lab Results  Component Value Date   NA 141 11/30/2017   K 4.3 11/30/2017   CL 105 11/30/2017   CO2 29 11/30/2017   GLUCOSE 128 (H) 11/30/2017   BUN 23 (H) 11/30/2017   CREATININE 0.74 11/30/2017   CALCIUM 9.4 11/30/2017   MG 2.2 11/02/2016   Lab Results  Component Value Date   INR 1.02 11/03/2016   Lab Results  Component Value Date   CHOL 232 (H) 11/03/2016   HDL 74 11/03/2016   LDLCALC 145 (H) 11/03/2016   TRIG 63 11/03/2016     GEN- The patient is well appearing, alert and oriented x 3 today.   Head- normocephalic, atraumatic Eyes-  Sclera clear, conjunctiva pink Ears- hearing intact Oropharynx- clear Neck- supple, no JVP Lymph- no cervical lymphadenopathy Lungs- Clear to ausculation bilaterally, normal work of breathing Heart- Regular rate and rhythm, no murmurs, rubs or gallops, PMI not laterally displaced GI- soft, NT, ND, + BS Extremities- no clubbing, cyanosis, or edema MS- no significant deformity or atrophy Skin- no rash or lesion Psych- euthymic mood, full affect Neuro- strength and sensation are intact  EKG-NSR at 70 bpm, PR int 134 ms, qrs int 80 ms, qtc 442 ms Epic records  reviewed    Assessment and Plan: 1. Paroxysmal afib Had an non sustained epiosde yesterday, had been very quiet  In SR today Continue metoprolol 25 mg bid,  she should continue perioperatively to discourage afib at  time of surgery, 10/3.  Chadsvasc score of  1, does not require anticoagulation  at this time  F/u with Dr. Rayann Heman pending in January,  was a pt of Dr. Caryl Comes, wants to stay with Dr. Lawrence Marseilles C. Airlie Blumenberg, [facility] [address] [place], [place] [address] [phone]

## 2018-06-14 DIAGNOSIS — C50912 Malignant neoplasm of unspecified site of left female breast: Secondary | ICD-10-CM | POA: Diagnosis not present

## 2018-06-14 DIAGNOSIS — L905 Scar conditions and fibrosis of skin: Secondary | ICD-10-CM | POA: Diagnosis not present

## 2018-06-14 DIAGNOSIS — I4891 Unspecified atrial fibrillation: Secondary | ICD-10-CM | POA: Diagnosis not present

## 2018-06-14 DIAGNOSIS — Z421 Encounter for breast reconstruction following mastectomy: Secondary | ICD-10-CM | POA: Diagnosis not present

## 2018-06-14 DIAGNOSIS — Z9012 Acquired absence of left breast and nipple: Secondary | ICD-10-CM | POA: Diagnosis not present

## 2018-06-14 DIAGNOSIS — Z79899 Other long term (current) drug therapy: Secondary | ICD-10-CM | POA: Diagnosis not present

## 2018-06-14 DIAGNOSIS — E669 Obesity, unspecified: Secondary | ICD-10-CM | POA: Diagnosis not present

## 2018-06-14 DIAGNOSIS — Z853 Personal history of malignant neoplasm of breast: Secondary | ICD-10-CM | POA: Diagnosis not present

## 2018-06-14 DIAGNOSIS — G4733 Obstructive sleep apnea (adult) (pediatric): Secondary | ICD-10-CM | POA: Diagnosis not present

## 2018-06-14 DIAGNOSIS — N641 Fat necrosis of breast: Secondary | ICD-10-CM | POA: Diagnosis not present

## 2018-06-14 DIAGNOSIS — M1990 Unspecified osteoarthritis, unspecified site: Secondary | ICD-10-CM | POA: Diagnosis not present

## 2018-06-14 DIAGNOSIS — D0512 Intraductal carcinoma in situ of left breast: Secondary | ICD-10-CM | POA: Diagnosis not present

## 2018-06-14 DIAGNOSIS — M958 Other specified acquired deformities of musculoskeletal system: Secondary | ICD-10-CM | POA: Diagnosis not present

## 2018-06-19 DIAGNOSIS — C50512 Malignant neoplasm of lower-outer quadrant of left female breast: Secondary | ICD-10-CM | POA: Diagnosis not present

## 2018-06-19 DIAGNOSIS — C50912 Malignant neoplasm of unspecified site of left female breast: Secondary | ICD-10-CM | POA: Diagnosis not present

## 2018-06-19 DIAGNOSIS — Z923 Personal history of irradiation: Secondary | ICD-10-CM | POA: Diagnosis not present

## 2018-06-19 DIAGNOSIS — D0512 Intraductal carcinoma in situ of left breast: Secondary | ICD-10-CM | POA: Diagnosis not present

## 2018-06-19 DIAGNOSIS — Z853 Personal history of malignant neoplasm of breast: Secondary | ICD-10-CM | POA: Diagnosis not present

## 2018-06-19 DIAGNOSIS — L598 Other specified disorders of the skin and subcutaneous tissue related to radiation: Secondary | ICD-10-CM | POA: Diagnosis not present

## 2018-07-03 DIAGNOSIS — C50512 Malignant neoplasm of lower-outer quadrant of left female breast: Secondary | ICD-10-CM | POA: Diagnosis not present

## 2018-07-03 DIAGNOSIS — L598 Other specified disorders of the skin and subcutaneous tissue related to radiation: Secondary | ICD-10-CM | POA: Diagnosis not present

## 2018-07-03 DIAGNOSIS — D0512 Intraductal carcinoma in situ of left breast: Secondary | ICD-10-CM | POA: Diagnosis not present

## 2018-07-03 DIAGNOSIS — Z853 Personal history of malignant neoplasm of breast: Secondary | ICD-10-CM | POA: Diagnosis not present

## 2018-07-03 DIAGNOSIS — C50912 Malignant neoplasm of unspecified site of left female breast: Secondary | ICD-10-CM | POA: Diagnosis not present

## 2018-07-03 DIAGNOSIS — Z923 Personal history of irradiation: Secondary | ICD-10-CM | POA: Diagnosis not present

## 2018-08-02 DIAGNOSIS — J209 Acute bronchitis, unspecified: Secondary | ICD-10-CM | POA: Diagnosis not present

## 2018-08-03 DIAGNOSIS — G4733 Obstructive sleep apnea (adult) (pediatric): Secondary | ICD-10-CM | POA: Diagnosis not present

## 2018-08-09 ENCOUNTER — Telehealth: Payer: Self-pay | Admitting: Internal Medicine

## 2018-08-09 DIAGNOSIS — R0789 Other chest pain: Secondary | ICD-10-CM | POA: Diagnosis not present

## 2018-08-09 DIAGNOSIS — H6123 Impacted cerumen, bilateral: Secondary | ICD-10-CM | POA: Diagnosis not present

## 2018-08-09 DIAGNOSIS — R7303 Prediabetes: Secondary | ICD-10-CM | POA: Diagnosis not present

## 2018-08-09 DIAGNOSIS — J9801 Acute bronchospasm: Secondary | ICD-10-CM | POA: Diagnosis not present

## 2018-08-09 NOTE — Telephone Encounter (Signed)
Patient calling and states that she has had tightness in her chest that worsens when she takes a deep breath. She states that her discomfort does not worsen with activity. No Hx of CAD. She states that she has been SOB and has had a cough. She states that she was recently treated for bronchitis. She states that she has had some nausea and an earache.  She denies irregular heartbeats or palpitations. Instructed the patient to follow up with PCP and let us know if her Sx change or worsen. Patient verbalized understanding.

## 2018-08-09 NOTE — Telephone Encounter (Signed)
New message    Pt c/o of Chest Pain: STAT if CP now or developed within 24 hours  1. Are you having CP right now? Pt states she is having chest tightness.   2. Are you experiencing any other symptoms (ex. SOB, nausea, vomiting, sweating)? Nausea, SOB with exertion   3. How long have you been experiencing CP? Since yesterday   4. Is your CP continuous or coming and going? Continuous   5. Have you taken Nitroglycerin? No  ?

## 2018-08-22 DIAGNOSIS — Z48817 Encounter for surgical aftercare following surgery on the skin and subcutaneous tissue: Secondary | ICD-10-CM | POA: Diagnosis not present

## 2018-08-22 DIAGNOSIS — L598 Other specified disorders of the skin and subcutaneous tissue related to radiation: Secondary | ICD-10-CM | POA: Diagnosis not present

## 2018-08-22 DIAGNOSIS — Z9012 Acquired absence of left breast and nipple: Secondary | ICD-10-CM | POA: Diagnosis not present

## 2018-09-14 DIAGNOSIS — R7303 Prediabetes: Secondary | ICD-10-CM | POA: Diagnosis not present

## 2018-09-14 DIAGNOSIS — E785 Hyperlipidemia, unspecified: Secondary | ICD-10-CM | POA: Diagnosis not present

## 2018-09-14 DIAGNOSIS — M129 Arthropathy, unspecified: Secondary | ICD-10-CM | POA: Diagnosis not present

## 2018-09-14 DIAGNOSIS — R600 Localized edema: Secondary | ICD-10-CM | POA: Diagnosis not present

## 2018-09-18 ENCOUNTER — Ambulatory Visit (INDEPENDENT_AMBULATORY_CARE_PROVIDER_SITE_OTHER): Payer: Self-pay

## 2018-09-18 ENCOUNTER — Ambulatory Visit (INDEPENDENT_AMBULATORY_CARE_PROVIDER_SITE_OTHER): Payer: BLUE CROSS/BLUE SHIELD | Admitting: Family Medicine

## 2018-09-18 ENCOUNTER — Encounter (INDEPENDENT_AMBULATORY_CARE_PROVIDER_SITE_OTHER): Payer: Self-pay | Admitting: Family Medicine

## 2018-09-18 DIAGNOSIS — M25552 Pain in left hip: Secondary | ICD-10-CM

## 2018-09-18 NOTE — Progress Notes (Signed)
Office Visit Note   Patient: Linda Foster           Date of Birth: November 23, 1955           MRN: [id number] Visit Date: 09/18/2018 Requested by: Carol Ada, [place], [place] [address] PCP: Carol Ada, MD  Subjective: Chief Complaint  Patient presents with  . Left Hip - Pain    Feels a "catch" in the hip with walking, has immediate pain that quickly goes away - started 09/15/18. Feels like hip will give way.    HPI: She is a 63 year old with left hip pain.  Symptoms started a few days ago, she woke up feeling discomfort in her hip and it started catching intermittently with weightbearing.  At times the pain was so severe she thought her hip is going to give out.  Yesterday she rested most of the afternoon and today she is feeling much better although still having some pain in the groin area.  She has had minor troubles with her hip over the past several months but nothing like this.  She is not taking any medication for her pain.  She has not had any trauma to cause this.              ROS: She is otherwise in good health, other systems were noncontributory  Objective: Vital Signs: There were no vitals taken for this visit.  Physical Exam:  Left hip: No significant tenderness over the greater trochanter.  No pain with isometric flexion, abduction or adduction against resistance.  She does have some pain with passive flexion and internal rotation, but her range of motion is still good.  Imaging: X-rays left hip: Moderate osteoarthritis with periarticular spurring.  No sign of stress fracture or neoplasm.    Assessment & Plan: 1.  Left groin pain, possibly due to osteoarthritis but could be degenerative acetabular labrum tear. -She is clinically improved in the last couple days.  She will use naproxen as needed.  She already takes glucosamine and vitamin D3.  She will start doing straight leg raises for strengthening and low impact aerobic exercises.   If flareup occurs again, we could consider one-time cortisone injection.  If still no improvement, then possibly MRI scan.   Follow-Up Instructions: No follow-ups on file.      Procedures: No procedures performed  No notes on file    PMFS History: Patient Active Problem List   Diagnosis Date Noted  . OSA (obstructive sleep apnea) 02/13/2018  . 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 Family history of prostate cancer 01/02/2018  . Breast cancer, left breast ([place]) 01/02/2018  . Genetic testing 12/15/2017  . Primary malignant neoplasm of lower outer quadrant of female breast, left ([place]) 12/14/2017  . History of therapeutic radiation 11/01/2017  . 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 Chest pain with moderate risk of acute coronary syndrome 11/03/2016  . Atrial fibrillation with rapid ventricular response ([place]) 11/02/2016  . History of invasive breast cancer 11/06/2012   Past Medical History:  Diagnosis Date  . A-fib ([place])   . 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 Breast cancer ([facility]) 2019   left ER/PR +  . Family history of prostate cancer   . Lymphedema 2013  . OSA (obstructive sleep apnea)    does not use CPAP  . Osteoarthritis     Family History  Problem Relation Age of Onset  . Hypertension Mother   . COPD Father   . Prostate cancer Father 60  . Lung cancer Brother 50  . Prostate cancer Brother 63  . Cancer Cousin        type unk dx. >50  . Cancer Cousin 58       type unk  . Cancer Cousin        type and age dx unkown    Past Surgical History:  Procedure Laterality Date  . ABDOMINAL HYSTERECTOMY    . BREAST LUMPECTOMY Left 2008  . BREAST LUMPECTOMY WITH RADIOACTIVE SEED LOCALIZATION Left 12/05/2017   Procedure: BREAST LUMPECTOMY WITH RADIOACTIVE SEED LOCALIZATION;  Surgeon: Rolm Bookbinder, MD;  Location: [facility];  Service: General;  Laterality: Left;  .  CESAREAN SECTION      x 3  . KNEE ARTHROSCOPY Right 2007   Social History   Occupational History  . Not on file  Tobacco Use  . Smoking status: Never Smoker  . Smokeless tobacco: Never Used  Substance and Sexual Activity  . Alcohol use: No  . Drug use: No  . Sexual activity: Not on file

## 2018-10-04 ENCOUNTER — Encounter (INDEPENDENT_AMBULATORY_CARE_PROVIDER_SITE_OTHER): Payer: Self-pay | Admitting: Family Medicine

## 2018-10-04 ENCOUNTER — Ambulatory Visit (INDEPENDENT_AMBULATORY_CARE_PROVIDER_SITE_OTHER): Payer: BLUE CROSS/BLUE SHIELD | Admitting: Family Medicine

## 2018-10-04 DIAGNOSIS — M25552 Pain in left hip: Secondary | ICD-10-CM | POA: Diagnosis not present

## 2018-10-04 MED ORDER — PREDNISONE 10 MG PO TABS: ORAL_TABLET | ORAL | 0 refills | Status: DC

## 2018-10-04 NOTE — Progress Notes (Signed)
Office Visit Note   Patient: Linda Foster           Date of Birth: [birth year]           MRN: [id number] Visit Date: 10/04/2018 Requested by: Carol Ada, [place], [place] [address] PCP: Carol Ada, MD  Subjective: Chief Complaint  Patient presents with  . Left Hip - Pain    Hip is "catching" more often and now feeling a pain in the groin area since approximately 09/21/18.  This is worse first a.m. with upstart and with bending over at the waist.    HPI: She is here with persistent left hip pain.  Since last visit it has become more of a constant ache rather than intermittent catching.  Able to sleep at night but it wakes her up when she rolls over.  She walks with a slight limp, painful when she first gets up to walk.  She is reluctant to have an injection.              ROS: Noncontributory  Objective: Vital Signs: There were no vitals taken for this visit.  Physical Exam:  Left hip: Mild tenderness over the greater trochanter, moderate pain with passive flexion and internal rotation.  No pain with isometric lection, abduction or adduction.  Imaging: None today.  Assessment & Plan: 1.  Persistent left hip pain probably due to DJD -Elected to try a prednisone taper.  If symptoms persist could inject with ultrasound guidance, or order MRI scan to look for stress fracture although I think it would be unlikely.   Follow-Up Instructions: No follow-ups on file.      Procedures: No procedures performed  No notes on file    PMFS History: Patient Active Problem List   Diagnosis Date Noted  . OSA (obstructive sleep apnea) 02/13/2018  . 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 Family history of prostate cancer 01/02/2018  . Breast cancer, left breast ([place]) 01/02/2018  . Genetic testing 12/15/2017  . Primary malignant neoplasm of lower outer quadrant of female breast, left ([place] [facility]) 12/14/2017  . History of  therapeutic radiation 11/01/2017  . 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 Chest pain with moderate risk of acute coronary syndrome 11/03/2016  . Atrial fibrillation with rapid ventricular response ([place]) 11/02/2016  . History of invasive breast cancer 11/06/2012   Past Medical History:  Diagnosis Date  . A-fib ([place])   . 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 Breast cancer ([facility]) 2019   left ER/PR +  . Family history of prostate cancer   . Lymphedema 2013  . OSA (obstructive sleep apnea)    does not use CPAP  . Osteoarthritis     Family History  Problem Relation Age of Onset  . Hypertension Mother   . COPD Father   . Prostate cancer Father 61  . Lung cancer Brother 56  . Prostate cancer Brother 81  . Cancer Cousin        type unk dx. >50  . Cancer Cousin 58       type unk  . Cancer Cousin        type and age dx unkown    Past Surgical History:  Procedure Laterality Date  . ABDOMINAL HYSTERECTOMY    . BREAST LUMPECTOMY Left 2008  . BREAST LUMPECTOMY WITH RADIOACTIVE SEED LOCALIZATION Left 12/05/2017   Procedure:  BREAST LUMPECTOMY WITH RADIOACTIVE SEED LOCALIZATION;  Surgeon: Rolm Bookbinder, MD;  Location: [facility];  Service: General;  Laterality: Left;  . CESAREAN SECTION      x 3  . KNEE ARTHROSCOPY Right 2007   Social History   Occupational History  . Not on file  Tobacco Use  . Smoking status: Never Smoker  . Smokeless tobacco: Never Used  Substance and Sexual Activity  . Alcohol use: No  . Drug use: No  . Sexual activity: Not on file

## 2018-10-16 DIAGNOSIS — Z780 Asymptomatic menopausal state: Secondary | ICD-10-CM | POA: Diagnosis not present

## 2018-10-16 DIAGNOSIS — M8589 Other specified disorders of bone density and structure, multiple sites: Secondary | ICD-10-CM | POA: Diagnosis not present

## 2018-10-16 DIAGNOSIS — Z90710 Acquired absence of both cervix and uterus: Secondary | ICD-10-CM | POA: Diagnosis not present

## 2018-10-16 DIAGNOSIS — Z853 Personal history of malignant neoplasm of breast: Secondary | ICD-10-CM | POA: Diagnosis not present

## 2018-10-23 ENCOUNTER — Telehealth (INDEPENDENT_AMBULATORY_CARE_PROVIDER_SITE_OTHER): Payer: Self-pay | Admitting: Radiology

## 2018-10-23 DIAGNOSIS — M25552 Pain in left hip: Secondary | ICD-10-CM

## 2018-10-23 NOTE — Telephone Encounter (Signed)
I called patient and advised MRI scan ordered.

## 2018-10-23 NOTE — Telephone Encounter (Signed)
MRI ordered.  Could come in for injection instead if she prefers.

## 2018-10-23 NOTE — Addendum Note (Signed)
Addended by: Hortencia Pilar on: 10/23/2018 01:20 PM   Modules accepted: Orders

## 2018-10-23 NOTE — Telephone Encounter (Signed)
Patient reached out to me last week and says that the hip is no better after the meds.  She is asking to go ahead and do MRI vs the injections at this time.  Is it ok to order the MRI?

## 2018-10-25 ENCOUNTER — Telehealth (INDEPENDENT_AMBULATORY_CARE_PROVIDER_SITE_OTHER): Payer: Self-pay | Admitting: Family Medicine

## 2018-10-25 ENCOUNTER — Ambulatory Visit
Admission: RE | Admit: 2018-10-25 | Discharge: 2018-10-25 | Disposition: A | Discharge status: Home / Self Care | Payer: BLUE CROSS/BLUE SHIELD | Source: Ambulatory Visit | Attending: Family Medicine | Admitting: Family Medicine

## 2018-10-25 DIAGNOSIS — M25552 Pain in left hip: Secondary | ICD-10-CM

## 2018-10-25 DIAGNOSIS — M1612 Unilateral primary osteoarthritis, left hip: Secondary | ICD-10-CM | POA: Diagnosis not present

## 2018-10-25 MED ORDER — ETODOLAC 400 MG PO TABS: 400.0000 mg | ORAL_TABLET | Freq: Two times a day (BID) | ORAL | 3 refills | Status: DC | PRN

## 2018-10-25 NOTE — Telephone Encounter (Signed)
I spoke to the patient about MRI results.  No sign of stress fracture in her hip but she does have moderate to severe DJD.  Degenerative labrum tears as well.  Her pain did not improve much with prednisone.  She is still reluctant to try an injection.  We will switch to a different anti-inflammatory to see how this helps.  Follow-up as needed.

## 2018-10-27 ENCOUNTER — Other Ambulatory Visit: Payer: BLUE CROSS/BLUE SHIELD

## 2018-11-01 ENCOUNTER — Encounter (INDEPENDENT_AMBULATORY_CARE_PROVIDER_SITE_OTHER): Payer: Self-pay | Admitting: Family Medicine

## 2018-11-01 ENCOUNTER — Ambulatory Visit (INDEPENDENT_AMBULATORY_CARE_PROVIDER_SITE_OTHER): Payer: BLUE CROSS/BLUE SHIELD | Admitting: Family Medicine

## 2018-11-01 DIAGNOSIS — M25552 Pain in left hip: Secondary | ICD-10-CM | POA: Diagnosis not present

## 2018-11-01 MED ORDER — LIDOCAINE HCL 1 % IJ SOLN
5.0000 mL | INTRAMUSCULAR | Status: AC | PRN
  Administered 2018-11-01: 5 mL

## 2018-11-01 MED ORDER — METHYLPREDNISOLONE ACETATE 40 MG/ML IJ SUSP
40.0000 mg | INTRAMUSCULAR | Status: AC | PRN
  Administered 2018-11-01: 40 mg via INTRA_ARTICULAR

## 2018-11-01 NOTE — Progress Notes (Signed)
Office Visit Note   Patient: Linda Foster           Date of Birth: 10-28-1955           MRN: [id number] Visit Date: 11/01/2018 Requested by: Carol Ada, [place], [place] [address] PCP: Carol Ada, MD  Subjective: Chief Complaint  Patient presents with  . Left Hip - Pain    Cortisone injection     HPI: She is here with persistent left hip pain.  Oral medications have not helped.  MRI scan showed arthritis and labral degeneration but no stress fracture.  Sometimes her leg feels okay for minute, other times when she puts weight on it it feels like is going to give out.  She is very frustrated by her ongoing pain.              ROS: Noncontributory  Objective: Vital Signs: There were no vitals taken for this visit.  Physical Exam:  Left hip: She does have pain with passive flexion and internal rotation.  She walks with a limp.  No tenderness over the greater trochanter.  Imaging: None   Assessment & Plan: 1.  Left hip DJD -Discussed options with patient and elected to try a cortisone injection intra-articular, ultrasound-guided.  She fails to improve consider surgical consult for hip replacement.     Procedures: Large Joint Inj: L hip joint on 11/01/2018 1:20 PM Indications: pain Details: 22 G 3.5 in needle, ultrasound-guided lateral approach Medications: 5 mL lidocaine 1 %; 40 mg methylPREDNISolone acetate 40 MG/ML Consent was given by the patient.      No notes on file     PMFS History: Patient Active Problem List   Diagnosis Date Noted  . OSA (obstructive sleep apnea) 02/13/2018  . 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 Family history of prostate cancer 01/02/2018  . Breast cancer, left breast ([facility]) 01/02/2018  . Genetic testing 12/15/2017  . Primary malignant neoplasm of lower outer quadrant of female breast, left ([facility]) 12/14/2017  . History of therapeutic radiation 11/01/2017  .  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 Chest pain with moderate risk of acute coronary syndrome 11/03/2016  . Atrial fibrillation with rapid ventricular response ([place]) 11/02/2016  . History of invasive breast cancer 11/06/2012   Past Medical History:  Diagnosis Date  . A-fib ([place])   . 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 Breast cancer ([facility]) 2019   left ER/PR +  . Family history of prostate cancer   . Lymphedema 2013  . OSA (obstructive sleep apnea)    does not use CPAP  . Osteoarthritis     Family History  Problem Relation Age of Onset  . Hypertension Mother   . COPD Father   . Prostate cancer Father 14  . Lung cancer Brother 13  . Prostate cancer Brother 64  . Cancer Cousin        type unk dx. >50  . Cancer Cousin 58       type unk  . Cancer Cousin        type and age dx unkown    Past Surgical History:  Procedure Laterality Date  . ABDOMINAL HYSTERECTOMY    . BREAST LUMPECTOMY Left 2008  . BREAST LUMPECTOMY WITH RADIOACTIVE SEED LOCALIZATION Left 12/05/2017   Procedure: BREAST LUMPECTOMY WITH RADIOACTIVE SEED LOCALIZATION;  Surgeon: Rolm Bookbinder, MD;  Location: [facility];  Service: General;  Laterality: Left;  . CESAREAN SECTION      x 3  . KNEE ARTHROSCOPY Right 2007   Social History   Occupational History  . Not on file  Tobacco Use  . Smoking status: Never Smoker  . Smokeless tobacco: Never Used  Substance and Sexual Activity  . Alcohol use: No  . Drug use: No  . Sexual activity: Not on file

## 2018-11-10 ENCOUNTER — Encounter (INDEPENDENT_AMBULATORY_CARE_PROVIDER_SITE_OTHER): Payer: Self-pay | Admitting: Family Medicine

## 2018-11-10 ENCOUNTER — Ambulatory Visit (INDEPENDENT_AMBULATORY_CARE_PROVIDER_SITE_OTHER): Payer: BLUE CROSS/BLUE SHIELD | Admitting: Family Medicine

## 2018-11-10 DIAGNOSIS — M25552 Pain in left hip: Secondary | ICD-10-CM | POA: Diagnosis not present

## 2018-11-10 MED ORDER — METHYLPREDNISOLONE ACETATE 40 MG/ML IJ SUSP: 40.0000 mg | Freq: Once | INTRAMUSCULAR | Status: DC

## 2018-11-10 NOTE — Progress Notes (Signed)
  Linda Foster - 63 y.o. female MRN [id number]  Date of birth: Dec 25, 1955    SUBJECTIVE:      Chief Complaint: left hip pain  HPI:  63 yr old female returns with left hip pain. S/p left hip intraarticular steroid injection 11/01/18. She reports mild improvement but localizes her pain now to the lateral hip. She does still occasionally have sharp pain in the groin, but the lateral hip pain is more bothersome. She denies feeling like her leg will give out. No injuries. No localized swelling or erythema. No numbness/tingling. Laying with her hip flexed to 90 degrees is comfortable for her.   ROS:     See HPI  PERTINENT  PMH / PSH FH / / SH:  Past Medical, Surgical, Social, and Family History Reviewed & Updated in the EMR.    OBJECTIVE: There were no vitals taken for this visit.  Physical Exam:  Vital signs are reviewed.  GEN: Alert and oriented, NAD Pulm: Breathing unlabored PSY: normal mood, congruent affect  MSK: Left Hip:  - Inspection: No gross deformity, no swelling, erythema, or ecchymosis - Palpation: TTP over the posterior aspect of the greater trochanter - ROM: Normal range of motion on Flexion, extension, abduction, internal and external rotation - Strength: Normal strength. - Neuro/vasc: NV intact distally - Special Tests: Positive FABER, negative FADIR.    ASSESSMENT & PLAN:  1. Left hip pain - patient has known OA in the hip. The majority of her pain today, however, seems to be more related to the greater trochanter and not intraarticular.  - GT steroid injection performed today - if she continues to have groin pain, she wishes to pursue PRP for this - f/u as needed    Procedure performed: Greater trochanteric corticosteroid injection; palpation guided  Consent obtained and verified. Time-out conducted. Noted no overlying erythema, induration, or other signs of local infection. The point of maximal tenderness was palpated over the LEFT GT and and marked. The  overlying skin was prepped in a sterile fashion. Topical analgesic spray: Ethyl chloride. Needle: 22GA, 3.5" Completed without difficulty. Meds: depomedrol 40mg , lidocaine 5cc

## 2018-11-10 NOTE — Progress Notes (Signed)
I saw and examined the patient with Dr. Okey Dupre and agree with assessment and plan as outlined.  Most symptoms today seem to be from greater trochanter, so injected it today.  If groin pain worsens, will try PRP.

## 2018-11-15 ENCOUNTER — Ambulatory Visit (INDEPENDENT_AMBULATORY_CARE_PROVIDER_SITE_OTHER): Payer: BLUE CROSS/BLUE SHIELD | Admitting: Orthopaedic Surgery

## 2018-11-15 ENCOUNTER — Encounter (INDEPENDENT_AMBULATORY_CARE_PROVIDER_SITE_OTHER): Payer: Self-pay | Admitting: Orthopaedic Surgery

## 2018-11-15 DIAGNOSIS — M1612 Unilateral primary osteoarthritis, left hip: Secondary | ICD-10-CM

## 2018-11-15 DIAGNOSIS — M25552 Pain in left hip: Secondary | ICD-10-CM | POA: Diagnosis not present

## 2018-11-15 NOTE — Progress Notes (Signed)
Office Visit Note   Patient: Linda Foster           Date of Birth: Aug 17, 1956           MRN: [id number] Visit Date: 11/15/2018              Requested by: Carol Ada, [place], [place] [address] PCP: Carol Ada, MD   Assessment & Plan: Visit Diagnoses:  1. Pain in left hip   2. Unilateral primary osteoarthritis, left hip     Plan: I went over the patient's x-rays and plain films as well as MRI in detail.  We talked about hip replacement surgery.  I agree that this is the next step for her given the failure of conservative treatment measures.  We talked about the risk and benefits of surgery.  We talked about her operative and postoperative course in detail.  All question concerns were answered and addressed.  I gave her handout about hip replacement surgery as well.  She is interested in having this scheduled as soon as possible in the near future.  We will see what we can do to get this scheduled.  Follow-Up Instructions: Return for 2 weeks post-op.   Orders:  No orders of the defined types were placed in this encounter.  No orders of the defined types were placed in this encounter.     Procedures: No procedures performed   Clinical Data: No additional findings.   Subjective: Chief Complaint  Patient presents with  . Left Hip - Pain, Follow-up  The patient is a brand-new patient for me but she is actually established patient of our office.  She said to me to evaluate and treat her left hip to treat the pain associated with severe osteoarthritis and degenerative joint disease.  She is already had 2 intra-articular injections in that left hip.  The first 1 did help with the groin pain.  This is been rapidly getting worse to her.  Is now detrimentally affecting her actives daily living, her quality of life and her mobility.  This worsened after a fall having some trauma to that hip.  She is otherwise healthy individual who is a breast  cancer survivor  Her pain is daily and it is worsening and she is having problems just ambulating in general.  She has no other active medical issues.  Her husband is with her today.  She has plain films and MRI for me to review as well.  HPI  Review of Systems She currently denies any headache, chest pain, shortness of breath, fever, chills, nausea, vomiting  Objective: Vital Signs: There were no vitals taken for this visit.  Physical Exam She is alert and orient x3 and in no acute distress Ortho Exam Examination of her right hip is normal examination of her left hip shows severe pain with attempts of internal and external rotation. Specialty Comments:  No specialty comments available.  Imaging: No results found. Both plain films and MRI of her left hip shows significant arthritic changes.  There is cystic changes in the femoral head and acetabulum.  There is joint effusion.  There are particular osteophytes and joint space narrowing.  This is consistent with severe arthritis.  PMFS History: Patient Active Problem List   Diagnosis Date Noted  . OSA (obstructive sleep apnea) 02/13/2018  . 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 Family history of prostate cancer 01/02/2018  . Breast cancer, left  breast ([place]) 01/02/2018  . Genetic testing 12/15/2017  . Primary malignant neoplasm of lower outer quadrant of female breast, left ([place]) 12/14/2017  . History of therapeutic radiation 11/01/2017  . 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 Chest pain with moderate risk of acute coronary syndrome 11/03/2016  . Atrial fibrillation with rapid ventricular response ([place]) 11/02/2016  . History of invasive breast cancer 11/06/2012   Past Medical History:  Diagnosis Date  . A-fib ([place])   . 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 Breast cancer ([place]) 2019   left ER/PR +  . Family history of prostate cancer   .  Lymphedema 2013  . OSA (obstructive sleep apnea)    does not use CPAP  . Osteoarthritis     Family History  Problem Relation Age of Onset  . Hypertension Mother   . COPD Father   . Prostate cancer Father 45  . Lung cancer Brother 52  . Prostate cancer Brother 44  . Cancer Cousin        type unk dx. >50  . Cancer Cousin 58       type unk  . Cancer Cousin        type and age dx unkown    Past Surgical History:  Procedure Laterality Date  . ABDOMINAL HYSTERECTOMY    . BREAST LUMPECTOMY Left 2008  . BREAST LUMPECTOMY WITH RADIOACTIVE SEED LOCALIZATION Left 12/05/2017   Procedure: BREAST LUMPECTOMY WITH RADIOACTIVE SEED LOCALIZATION;  Surgeon: Rolm Bookbinder, MD;  Location: [facility];  Service: General;  Laterality: Left;  . CESAREAN SECTION      x 3  . KNEE ARTHROSCOPY Right 2007   Social History   Occupational History  . Not on file  Tobacco Use  . Smoking status: Never Smoker  . Smokeless tobacco: Never Used  Substance and Sexual Activity  . Alcohol use: No  . Drug use: No  . Sexual activity: Not on file

## 2018-11-20 DIAGNOSIS — C50512 Malignant neoplasm of lower-outer quadrant of left female breast: Secondary | ICD-10-CM | POA: Diagnosis not present

## 2018-11-20 DIAGNOSIS — Z853 Personal history of malignant neoplasm of breast: Secondary | ICD-10-CM | POA: Diagnosis not present

## 2018-11-20 DIAGNOSIS — L598 Other specified disorders of the skin and subcutaneous tissue related to radiation: Secondary | ICD-10-CM | POA: Diagnosis not present

## 2018-11-20 DIAGNOSIS — D0512 Intraductal carcinoma in situ of left breast: Secondary | ICD-10-CM | POA: Diagnosis not present

## 2018-11-20 DIAGNOSIS — C50912 Malignant neoplasm of unspecified site of left female breast: Secondary | ICD-10-CM | POA: Diagnosis not present

## 2018-11-20 DIAGNOSIS — Z923 Personal history of irradiation: Secondary | ICD-10-CM | POA: Diagnosis not present

## 2018-11-29 ENCOUNTER — Telehealth (INDEPENDENT_AMBULATORY_CARE_PROVIDER_SITE_OTHER): Payer: Self-pay | Admitting: Radiology

## 2018-11-29 NOTE — Telephone Encounter (Signed)
I called and advised patient that surgery is cancelled.  Will call her back to reschedule once scheduling opens up again.

## 2018-11-29 NOTE — Telephone Encounter (Signed)
Patient is scheduled for 12/12/18 for left hip replacement.  She is having severe pain and her left hip is catching and causing her to fall.  She really wants to have the surgery done if at all possible and not have to wait 4 weeks plus.  Please advise once more details are given and you can look at this for her?  Thanks.

## 2018-11-29 NOTE — Telephone Encounter (Signed)
Unfortunately, there is nothing I can do at the moment.  Total joints are considered elective cases and I can't see the hospital making any exceptions.  They would have to make those exceptions for a lot of patients unfortunately, and that is not going to happen.   My hands are tied.

## 2018-12-26 ENCOUNTER — Inpatient Hospital Stay (INDEPENDENT_AMBULATORY_CARE_PROVIDER_SITE_OTHER): Payer: BLUE CROSS/BLUE SHIELD | Admitting: Orthopaedic Surgery

## 2019-01-11 ENCOUNTER — Telehealth: Payer: Self-pay | Admitting: Oncology

## 2019-01-11 NOTE — Telephone Encounter (Signed)
Spoke with pt re: 01/15/19 appt. Letting them know it has been changed to virtual mtg.  Emailed step-by-step instructions how to download and access mtg.

## 2019-01-12 NOTE — Progress Notes (Signed)
Linda Foster  [phone] [phone]     ID: KITT MINARDI DOB: 09-Nov-1955  MR#: [id number]  PYK#:[id number]  Patient Care Team: Carol Ada, MD as PCP - General (Family Medicine) Ahleah Compton, MD as Consulting Physician (Obstetrics and Gynecology) Marlou Sa Tonna Corner, MD as Consulting Physician (Orthopedic Surgery) Rolm Bookbinder, MD as Consulting Physician (General Surgery) Kino Dunsworth, Virgie Dad, MD as Consulting Physician (Oncology) Irene Limbo, MD as Consulting Physician (Plastic Surgery) Jacinto Reap, MD (Plastic Surgery) Belva Crome, MD as Consulting Physician (Cardiology) Belva Crome, MD as Consulting Physician (Cardiology) OTHER MD:  I connected with Clois Comber on @TODAY @ at 11:30 AM EDT by video enabled telemedicine visit and verified that I am speaking with the correct person using two identifiers.   I discussed the limitations, risks, security and privacy concerns of performing an evaluation and management service by telemedicine and the availability of in-person appointments. I also discussed with the patient that there may be a patient responsible charge related to this service. The patient expressed understanding and agreed to proceed.   Other persons participating in the visit and their role in the encounter: Wilburn Mylar, scribe   Patient's location: Home Provider's location: [place]: estrogen receptor positive breast cancer  CURRENT TREATMENT: anastrozole   INTERVAL HISTORY: [name] was seen today for follow up of her estrogen receptor positive breast cancer accompanied by her husband time  She continues on anastrozole, with good tolerance.  She does have some vaginal dryness issues for which she uses various creams, with mild success.  Since her last visit, she underwent diagnostic right breast mammogram on 05/29/2018 at [facility]. This showed: breast density  category C; no mammographic evidence of malignancy.  She also underwent bone density testing on 10/16/2018. This showed a T-score of -1.4, which is considered osteopenic.  Of note, she underwent left breast revisions and right breast mastopexy on 06/14/2018 under Dr. Abelino Derrick at [facility]  She has completed her reconstruction there and is very satisfied with the results.   REVIEW OF SYSTEMS: [name] is not exercising regularly because of hip problems.  She was supposed to have had surgery under Dr. Zollie Beckers March of this year but because of the pandemic that had to be postponed.  She is not using a cane.  She is not walking or exercising very much at all at this point.  Aside from that issue a detailed review of systems today was stable  HISTORY OF CURRENT ILLNESS: From the original intake note:  BILAN TEDESCO had routine screening mammography on 10/03/2017 showing a possible abnormality in the left breast. She underwent unilateral left breast diagnostic mammography with tomography at [facility] on 10/13/2017 showing: breast density category C. There are new grouped calcifications in the left breast lower outer quadrant and middle depth. Ultrasound of the left axilla on 10/20/2017 showed no abnormalities.   Accordingly on 10/20/2017 she proceeded to biopsy of the left breast area in question. The pathology from this procedure showed ([id number]): Ductal carcinoma in situ, intermediate grade. The largest focus measures 4 mm. The carcinoma is cribriform type with necrosis and associated microcalcifications. Prognostic indicators significant for: estrogen receptor, 95% positive and progesterone receptor, 80% positive, both with strong staining intensity.  Accordingly on 12/05/2017 she proceeded to left lumpectomy.  The pathology [id number]) showed invasive ductal carcinoma, grade 1, measuring 0.3 cm, in addition to low-grade ductal carcinoma in situ.  Both the invasive and in situ components  were focally  less than a millimeter from the inferior margin.  Note that Lamiyah has a history of prior breast cancer, status post left lumpectomy for what seems to have been a T2 N3 invasive ductal carcinoma, estrogen and progesterone receptor negative.  She was treated with chemotherapy followed by radiation.  Accordingly the patient is not a candidate for repeat radiation and the feasibility or value of sentinel lymph node sampling is questionable.  [name] has been advised by Dr. Donne Hazel that mastectomy is standard in this situation and she has met with Dr. Iran Planas to consider reconstruction options.  However the patient is interested in exploring the possibility of lumpectomy.  Her subsequent history is as detailed below.   PAST MEDICAL HISTORY: Past Medical History:  Diagnosis Date  . A-fib ([place])   . 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 Breast cancer ([place]) 2019   left ER/PR +  . Family history of prostate cancer   . Lymphedema 2013  . OSA (obstructive sleep apnea)    does not use CPAP  . Osteoarthritis     PAST SURGICAL HISTORY: Past Surgical History:  Procedure Laterality Date  . ABDOMINAL HYSTERECTOMY    . BREAST LUMPECTOMY Left 2008  . BREAST LUMPECTOMY WITH RADIOACTIVE SEED LOCALIZATION Left 12/05/2017   Procedure: BREAST LUMPECTOMY WITH RADIOACTIVE SEED LOCALIZATION;  Surgeon: Rolm Bookbinder, MD;  Location: [facility];  Service: General;  Laterality: Left;  . CESAREAN SECTION      x 3  . KNEE ARTHROSCOPY Right 2007  She had an episode of  A Fib in 2018. She was being seen by Dr. Oran Rein about 10 years ago. Hysterectomy with USO  FAMILY HISTORY Family History  Problem Relation Age of Onset  . Hypertension Mother   . COPD Father   . Prostate cancer Father 89  . Lung cancer Brother 45  . Prostate cancer Brother 8  . Cancer Cousin        type unk dx. >50  . Cancer Cousin 58       type unk  . Cancer Cousin        type and age dx unkown   Very detailed family history is available in the genetics counseling note from 11/14/2017.  The patient has been genetically tested and no pathogenic mutation was identified (see below).  GYNECOLOGIC HISTORY:  Menarche: 63 years old Age at first live birth: 63 years old The patient is GXP3. She is s/p hysterectomy and USO in 2007. She did not receive HRT. She took oral contraceptives for more than a year remotely with no complications.   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 Konrad Dolores, her husband of 11 years, owns [employer]. The patient's daughter, Loma Sousa, is an Psychologist, prison and probation services at [employer]. The patient's daughter, Jerene Pitch, is a Probation officer with her own business. The patient's son, Lurena Joiner, works with Konrad Dolores at [employer], but he also has a degree in Therapist, sports. The patient has 7 grandchildren. She attends [employer].     ADVANCED DIRECTIVES: In the absence of any documents to the contrary the patient's husband is her healthcare power of attorney   HEALTH MAINTENANCE: Social History   Tobacco Use  . Smoking status: Never Smoker  . Smokeless tobacco: Never Used  Substance Use Topics  . Alcohol use: No  . Drug use: No     Colonoscopy: 10 years ago/ [facility]  PAP:  Bone density: January 2012/ normal    No Known Allergies  Current Outpatient Medications  Medication Sig Dispense Refill  . anastrozole (ARIMIDEX) 1 MG tablet Take 1 tablet (1 mg total) by mouth daily. 90 tablet 4  . B Complex Vitamins (VITAMIN-B COMPLEX PO) Take 2 tablets by mouth daily.    . Cholecalciferol (VITAMIN D3) 2000 units TABS Take 2,000 Units by mouth daily.    Marland Kitchen etodolac (LODINE) 400 MG tablet Take 1 tablet (400 mg total) by mouth 2 (two) times daily as needed. (Patient not taking: Reported on 11/10/2018) 60 tablet 3  . furosemide (LASIX) 40 MG tablet Take 1 tablet (40 mg total) by mouth daily. 90 tablet 3  .  glucosamine-chondroitin 500-400 MG tablet Take 2 tablets by mouth daily.     Marland Kitchen ibuprofen (ADVIL,MOTRIN) 200 MG tablet Take 200 mg by mouth every 6 (six) hours as needed.    . metoprolol tartrate (LOPRESSOR) 25 MG tablet Take 0.5 tablets (12.5 mg total) by mouth 2 (two) times daily. (Patient taking differently: Take 25 mg by mouth 2 (two) times daily. ) 60 tablet 11  . MULTIPLE VITAMIN PO Take 2 tablets by mouth daily.    . naproxen (NAPROSYN) 500 MG tablet TAKE 1 TABLET BY MOUTH WITH FOOD OR MILK EVERY 12 HOURS AS NEEDED    . potassium chloride (K-DUR) 10 MEQ tablet Take one tablet (10 meq) by mouth once every other day 45 tablet 3   Current Facility-Administered Medications  Medication Dose Route Frequency Provider Last Rate Last Dose  . methylPREDNISolone acetate (DEPO-MEDROL) injection 40 mg  40 mg Intra-articular Once Hilts, Michael, MD        OBJECTIVE: Middle-aged white woman who appears well  There were no vitals filed for this visit.   There is no height or weight on file to calculate BMI.   Wt Readings from Last 3 Encounters:  06/12/18 217 lb (98.4 kg)  04/14/18 203 lb (92.1 kg)  04/06/18 203 lb 14.4 oz (92.5 kg)      ECOG FS:2 - Symptomatic, <50% confined to bed   LAB RESULTS:  CMP     Component Value Date/Time   NA 141 11/30/2017 0726   NA 143 02/28/2017 0937   K 4.3 11/30/2017 0726   CL 105 11/30/2017 0726   CO2 29 11/30/2017 0726   GLUCOSE 128 (H) 11/30/2017 0726   BUN 23 (H) 11/30/2017 0726   BUN 14 02/28/2017 0937   CREATININE 0.74 11/30/2017 0726   CREATININE 0.82 10/26/2017 1218   CALCIUM 9.4 11/30/2017 0726   PROT 7.5 10/26/2017 1218   ALBUMIN 4.5 10/26/2017 1218   AST 18 10/26/2017 1218   ALT 15 10/26/2017 1218   ALKPHOS 84 10/26/2017 1218   BILITOT 0.9 10/26/2017 1218   GFRNONAA >60 11/30/2017 0726   GFRNONAA >60 10/26/2017 1218   GFRAA >60 11/30/2017 0726   GFRAA >60 10/26/2017 1218    No results found for: TOTALPROTELP, ALBUMINELP, A1GS, A2GS,  BETS, BETA2SER, GAMS, MSPIKE, SPEI  No results found for: KPAFRELGTCHN, LAMBDASER, KAPLAMBRATIO  Lab Results  Component Value Date   WBC 5.8 10/26/2017   NEUTROABS 2.8 10/26/2017   HGB 13.6 10/26/2017   HCT 41.0 10/26/2017   MCV 89.5 10/26/2017   PLT 198 10/26/2017    @LASTCHEMISTRY @  Lab Results  Component Value Date   LABCA2 30 09/16/2011    No components found for: IRCVEL381  No results for input(s): INR in the last 168 hours.  Lab  Results  Component Value Date   LABCA2 30 09/16/2011    No results found for: EGB151  No results found for: VOH607  No results found for: PXT062  No results found for: CA2729  No components found for: HGQUANT  No results found for: CEA1 / No results found for: CEA1   No results found for: AFPTUMOR  No results found for: [facility]  No results found for: PSA1  No visits with results within 3 Day(s) from this visit.  Latest known visit with results is:  Admission on 12/05/2017, Discharged on 12/05/2017  Component Date Value Ref Range Status  . Sodium 11/30/2017 141  135 - 145 mmol/L Final  . Potassium 11/30/2017 4.3  3.5 - 5.1 mmol/L Final  . Chloride 11/30/2017 105  101 - 111 mmol/L Final  . CO2 11/30/2017 29  22 - 32 mmol/L Final  . Glucose, Bld 11/30/2017 128* 65 - 99 mg/dL Final  . BUN 11/30/2017 23* 6 - 20 mg/dL Final  . Creatinine, Ser 11/30/2017 0.74  0.44 - 1.00 mg/dL Final  . Calcium 11/30/2017 9.4  8.9 - 10.3 mg/dL Final  . GFR calc non Af Amer 11/30/2017 >60  >60 mL/min Final  . GFR calc Af Amer 11/30/2017 >60  >60 mL/min Final   Comment: (NOTE) The eGFR has been calculated using the CKD EPI equation. This calculation has not been validated in all clinical situations. eGFR's persistently <60 mL/min signify possible Chronic Kidney Disease.   Georgiann Hahn gap 11/30/2017 7  5 - 15 Final   Performed at [facility], [place] [address], [place], [place] [address]    (this displays the last labs from the last 3  days)  No results found for: TOTALPROTELP, ALBUMINELP, A1GS, A2GS, BETS, BETA2SER, GAMS, MSPIKE, SPEI (this displays SPEP labs)  No results found for: KPAFRELGTCHN, LAMBDASER, KAPLAMBRATIO (kappa/lambda light chains)  No results found for: HGBA, HGBA2QUANT, HGBFQUANT, HGBSQUAN (Hemoglobinopathy evaluation)   Lab Results  Component Value Date   LDH 162 09/02/2010    No results found for: IRON, TIBC, IRONPCTSAT (Iron and TIBC)  No results found for: FERRITIN  Urinalysis    Component Value Date/Time   LABSPEC 1.020 10/30/2008 1837   PHURINE 7.5 10/30/2008 1837   GLUCOSEU NEGATIVE 10/30/2008 1837   HGBUR LARGE (A) 10/30/2008 [place] 10/30/2008 [place] 10/30/2008 1837   PROTEINUR 100 (A) 10/30/2008 1837   UROBILINOGEN 0.2 10/30/2008 1837   NITRITE NEGATIVE 10/30/2008 1837   LEUKOCYTESUR (A) 10/30/2008 1837    MODERATE Biochemical Testing Only. Please order routine urinalysis from main lab if confirmatory testing is needed.     STUDIES: Scheduled for right mammography at [facility] in June  ELIGIBLE FOR AVAILABLE RESEARCH PROTOCOL: no  ASSESSMENT: 63 y.o. [place] woman  (1) status post left lumpectomy and axillary lymph node dissection in 2008 for a T2N3 invasive ductal carcinoma, triple negative, pathology and treatment details not available at present--being retrieved  (a) status post adjuvant chemotherapy  (b) status post radiation  (2) status post left lumpectomy 12/05/2017 for a pT1a cN0, stage IA invasive ductal carcinoma, prognostic panel pending  (a) pathology also showed ductal carcinoma in situ, estrogen and progesterone receptor strongly positive  (b) both invasive and noninvasive components are focally less than 1 mm from the inferior margin  (3) genetics testing 11/14/2017 through the STAT Breast cancer panel offered by [facility] found no deleterious mutations in ATM, BRCA1, BRCA2, CDH1, CHEK2, PALB2, PTEN, STK11 and  TP53.   (a) reflex genetic testing through the Common Hereditary Cancer Panel offered by [facility] additionally found no deleterious mutations in APC, ATM, AXIN2, BARD1, BMPR1A, BRCA1, BRCA2, BRIP1, CDH1, CDKN2A (p14ARF), CDKN2A (p16INK4a), CKD4, CHEK2, CTNNA1, DICER1, EPCAM (Deletion/duplication testing only), GREM1 (promoter region deletion/duplication testing only), KIT, MEN1, MLH1, MSH2, MSH3, MSH6, MUTYH, NBN, NF1, NHTL1, PALB2, PDGFRA, PMS2, POLD1, POLE, PTEN, RAD50, RAD51C, RAD51D, SDHB, SDHC, SDHD, SMAD4, SMARCA4. STK11, TP53, TSC1, TSC2, and VHL.  The following genes were evaluated for sequence changes only: SDHA and HOXB13 c.251G>A variant only.  (c) variants of uncertain significance and APC and BR IP 1 were identified  (4) anastrozole started 12/25/2017  (a) bone density 10/16/2018 shows a T-score of - 1.4  (5) status post left simple mastectomy with DIEP reconstruction at [facility] 03/07/2018  (a) pathology showed no residual cancer in the surgical sample  (6) atrial fibrillation  PLAN: Tzipora is tolerating the anastrozole generally well and the plan will be to continue that a total of 5 years.  Her baseline bone density February of this year shows mild osteopenia.  When she has her hip surgery she will be able to start a more adequate exercise program, which will be helpful from an osteoporosis prevention point of view  After 2 years on anastrozole we can consider switching to tamoxifen which might allow her better options on the vaginal dryness issue  Otherwise I will see her again in 1 year.  She knows to call for any problems that may develop before her next visit.  Kimimila Tauzin, Virgie Dad, MD  01/15/19 11:35 AM Medical Oncology and Hematology [facility] [address] [place], [place] [address] Tel. [phone]    Fax. [phone]   I, Wilburn Mylar, am acting as scribe for Dr. Virgie Dad. Carlas Vandyne.  I, Lurline Del MD, have reviewed the above  documentation for accuracy and completeness, and I agree with the above.

## 2019-01-15 ENCOUNTER — Inpatient Hospital Stay: Payer: BLUE CROSS/BLUE SHIELD | Attending: Oncology | Admitting: Oncology

## 2019-01-15 ENCOUNTER — Other Ambulatory Visit: Payer: BLUE CROSS/BLUE SHIELD

## 2019-01-15 DIAGNOSIS — Z79811 Long term (current) use of aromatase inhibitors: Secondary | ICD-10-CM

## 2019-01-15 DIAGNOSIS — D0512 Intraductal carcinoma in situ of left breast: Secondary | ICD-10-CM

## 2019-01-15 DIAGNOSIS — Z170 Estrogen receptor positive status [ER+]: Secondary | ICD-10-CM

## 2019-01-15 DIAGNOSIS — Z171 Estrogen receptor negative status [ER-]: Secondary | ICD-10-CM | POA: Diagnosis not present

## 2019-01-15 DIAGNOSIS — C50512 Malignant neoplasm of lower-outer quadrant of left female breast: Secondary | ICD-10-CM

## 2019-01-15 MED ORDER — ANASTROZOLE 1 MG PO TABS: 1.0000 mg | ORAL_TABLET | Freq: Every day | ORAL | 4 refills | Status: DC

## 2019-01-23 DIAGNOSIS — E785 Hyperlipidemia, unspecified: Secondary | ICD-10-CM | POA: Diagnosis not present

## 2019-01-23 DIAGNOSIS — R600 Localized edema: Secondary | ICD-10-CM | POA: Diagnosis not present

## 2019-01-23 DIAGNOSIS — Z0000 Encounter for general adult medical examination without abnormal findings: Secondary | ICD-10-CM | POA: Diagnosis not present

## 2019-01-23 DIAGNOSIS — R7303 Prediabetes: Secondary | ICD-10-CM | POA: Diagnosis not present

## 2019-03-13 ENCOUNTER — Other Ambulatory Visit: Payer: Self-pay

## 2019-03-30 ENCOUNTER — Inpatient Hospital Stay: Admit: 2019-03-30 | Payer: BLUE CROSS/BLUE SHIELD | Admitting: Orthopaedic Surgery

## 2019-03-30 SURGERY — ARTHROPLASTY, HIP, TOTAL, ANTERIOR APPROACH
Anesthesia: Choice | Laterality: Left

## 2019-05-28 DIAGNOSIS — Z170 Estrogen receptor positive status [ER+]: Secondary | ICD-10-CM | POA: Diagnosis not present

## 2019-05-28 DIAGNOSIS — Z9012 Acquired absence of left breast and nipple: Secondary | ICD-10-CM | POA: Diagnosis not present

## 2019-05-28 DIAGNOSIS — C50912 Malignant neoplasm of unspecified site of left female breast: Secondary | ICD-10-CM | POA: Diagnosis not present

## 2019-05-28 DIAGNOSIS — C50911 Malignant neoplasm of unspecified site of right female breast: Secondary | ICD-10-CM | POA: Diagnosis not present

## 2019-05-28 DIAGNOSIS — Z853 Personal history of malignant neoplasm of breast: Secondary | ICD-10-CM | POA: Diagnosis not present

## 2019-05-31 ENCOUNTER — Other Ambulatory Visit: Payer: Self-pay | Admitting: Physician Assistant

## 2019-06-04 NOTE — Patient Instructions (Addendum)
DUE TO COVID-19 ONLY ONE VISITOR IS ALLOWED TO COME WITH YOU AND STAY IN THE WAITING ROOM ONLY DURING PRE OP AND PROCEDURE DAY OF SURGERY. THE 1 VISITOR MAY VISIT WITH YOU AFTER SURGERY IN YOUR PRIVATE ROOM DURING VISITING HOURS ONLY!  YOU NEED TO HAVE A COVID 19 TEST ON__Tuesday 9/29_____ @__3 :05_____, THIS TEST MUST BE DONE BEFORE SURGERY, COME  [place], [place] , [address].  ([place])  ONCE YOUR COVID TEST IS COMPLETED, PLEASE BEGIN THE QUARANTINE INSTRUCTIONS AS OUTLINED IN YOUR HANDOUT.                Linda Foster   Your procedure is scheduled on: Friday 06/15/19   Report to [facility] Main  Entrance   Report to Short Stay at 5:30  AM     Call this number if you have problems the morning of surgery [place], NO CHEWING GUM [place].    Do not eat food After Midnight.   YOU MAY HAVE CLEAR LIQUIDS FROM MIDNIGHT UNTIL 4:30AM.  At 4:30AM Please finish the prescribed Pre-Surgery Gatorade drink.  Nothing by mouth after you finish the Gatorade drink !    Take these medicines the morning of surgery with A SIP OF WATER: Metoprolol, Arimidex                                 You may not have any metal on your body including hair pins and              piercings             Do not wear jewelry, make-up, lotions, powders or perfumes, deodorant             Do not wear nail polish.  Do not shave  48 hours prior to surgery.            Do not bring valuables to the hospital. [place].  Contacts, dentures or bridgework may not be worn into surgery.        Name and phone number of your driver:  Special Instructions: N/A              Please read over the following fact sheets you were given: _____________________________________________________________________             [facility] - Preparing for Surgery  Before  surgery, you can play an important role.   Because skin is not sterile, your skin needs to be as free of germs as possible.   You can reduce the number of germs on your skin by washing with CHG (chlorahexidine gluconate) soap before surgery.   CHG is an antiseptic cleaner which kills germs and bonds with the skin to continue killing germs even after washing. Please DO NOT use if you have an allergy to CHG or antibacterial soaps.   If your skin becomes reddened/irritated stop using the CHG and inform your nurse when you arrive at Short Stay. Do not shave (including legs and underarms) for at least 48 hours prior to the first CHG shower  Please follow these instructions carefully:  1.  Shower with CHG Soap the night before surgery and the  morning of  Surgery.  2.  If you choose to wash your hair, wash your hair first as usual with your  normal  shampoo.  3.  After you shampoo, rinse your hair and body thoroughly to remove the  shampoo.                                        4.  Use CHG as you would any other liquid soap.  You can apply chg directly  to the skin and wash                       Gently with a scrungie or clean washcloth.  5.  Apply the CHG Soap to your body ONLY FROM THE NECK DOWN.   Do not use on face/ open                           Wound or open sores. Avoid contact with eyes, ears mouth and genitals (private parts).                       Wash face,  Genitals (private parts) with your normal soap.             6.  Wash thoroughly, paying special attention to the area where your surgery  will be performed.  7.  Thoroughly rinse your body with warm water from the neck down.  8.  DO NOT shower/wash with your normal soap after using and rinsing off  the CHG Soap.             9.  Pat yourself dry with a clean towel.            10.  Wear clean pajamas.            11.  Place clean sheets on your bed the night of your first shower and do not  sleep with pets. Day of Surgery : Do not apply  any lotions/deodorants the morning of surgery.  Please wear clean clothes to the hospital/surgery center.   FAILURE TO FOLLOW THESE INSTRUCTIONS MAY RESULT IN THE CANCELLATION OF YOUR SURGERY PATIENT SIGNATURE_________________________________  NURSE SIGNATURE__________________________________  ________________________________________________________________________   Linda Foster  An incentive spirometer is a tool that can help keep your lungs clear and active. This tool measures how well you are filling your lungs with each breath. Taking long deep breaths may help reverse or decrease the chance of developing breathing (pulmonary) problems (especially infection) following:  A long period of time when you are unable to move or be active. BEFORE THE PROCEDURE   If the spirometer includes an indicator to show your best effort, your nurse or respiratory therapist will set it to a desired goal.  If possible, sit up straight or lean slightly forward. Try not to slouch.  Hold the incentive spirometer in an upright position. INSTRUCTIONS FOR USE  1. Sit on the edge of your bed if possible, or sit up as far as you can in bed or on a chair. 2. Hold the incentive spirometer in an upright position. 3. Breathe out normally. 4. Place the mouthpiece in your mouth and seal your lips tightly around it. 5. Breathe in slowly and as deeply as possible, raising the piston or the ball toward the top of the column. 6. Hold your breath  for 3-5 seconds or for as long as possible. Allow the piston or ball to fall to the bottom of the column. 7. Remove the mouthpiece from your mouth and breathe out normally. 8. Rest for a few seconds and repeat Steps 1 through 7 at least 10 times every 1-2 hours when you are awake. Take your time and take a few normal breaths between deep breaths. 9. The spirometer may include an indicator to show your best effort. Use the indicator as a goal to work toward during each  repetition. 10. After each set of 10 deep breaths, practice coughing to be sure your lungs are clear. If you have an incision (the cut made at the time of surgery), support your incision when coughing by placing a pillow or rolled up towels firmly against it. Once you are able to get out of bed, walk around indoors and cough well. You may stop using the incentive spirometer when instructed by your caregiver.  RISKS AND COMPLICATIONS  Take your time so you do not get dizzy or light-headed.  If you are in pain, you may need to take or ask for pain medication before doing incentive spirometry. It is harder to take a deep breath if you are having pain. AFTER USE  Rest and breathe slowly and easily.  It can be helpful to keep track of a log of your progress. Your caregiver can provide you with a simple table to help with this. If you are using the spirometer at home, follow these instructions: [place] IF:   You are having difficultly using the spirometer.  You have trouble using the spirometer as often as instructed.  Your pain medication is not giving enough relief while using the spirometer.  You develop fever of 100.5 F (38.1 C) or higher. SEEK IMMEDIATE MEDICAL CARE IF:   You cough up bloody sputum that had not been present before.  You develop fever of 102 F (38.9 C) or greater.  You develop worsening pain at or near the incision site. MAKE SURE YOU:   Understand these instructions.  Will watch your condition.  Will get help right away if you are not doing well or get worse. Document Released: 01/10/2007 Document Revised: 11/22/2011 Document Reviewed: 03/13/2007 [facility] Patient Information 2014 [place], [place].   ________________________________________________________________________

## 2019-06-05 ENCOUNTER — Telehealth (HOSPITAL_COMMUNITY): Payer: Self-pay | Admitting: *Deleted

## 2019-06-05 ENCOUNTER — Encounter (HOSPITAL_COMMUNITY): Payer: Self-pay

## 2019-06-05 ENCOUNTER — Encounter (HOSPITAL_COMMUNITY)
Admission: RE | Admit: 2019-06-05 | Discharge: 2019-06-05 | Disposition: A | Discharge status: Home / Self Care | Payer: BC Managed Care – PPO | Source: Ambulatory Visit | Attending: Orthopaedic Surgery | Admitting: Orthopaedic Surgery

## 2019-06-05 ENCOUNTER — Other Ambulatory Visit: Payer: Self-pay

## 2019-06-05 DIAGNOSIS — Z01818 Encounter for other preprocedural examination: Secondary | ICD-10-CM | POA: Diagnosis not present

## 2019-06-05 DIAGNOSIS — M1612 Unilateral primary osteoarthritis, left hip: Secondary | ICD-10-CM | POA: Diagnosis not present

## 2019-06-05 HISTORY — DX: Family history of other specified conditions: Z84.89

## 2019-06-05 HISTORY — DX: Nausea with vomiting, unspecified: R11.2

## 2019-06-05 HISTORY — DX: Cardiac arrhythmia, unspecified: I49.9

## 2019-06-05 HISTORY — DX: Other specified postprocedural states: Z98.890

## 2019-06-05 LAB — BASIC METABOLIC PANEL
Anion gap: 10 (ref 5–15)
BUN: 21 mg/dL (ref 8–23)
CO2: 24 mmol/L (ref 22–32)
Calcium: 9.5 mg/dL (ref 8.9–10.3)
Chloride: 106 mmol/L (ref 98–111)
Creatinine, Ser: 0.68 mg/dL (ref 0.44–1.00)
GFR calc Af Amer: >60 mL/min (ref >60)
GFR calc non Af Amer: >60 mL/min (ref >60)
Glucose, Bld: 110 mg/dL — ABNORMAL HIGH (ref 70–99)
Potassium: 4.4 mmol/L (ref 3.5–5.1)
Sodium: 140 mmol/L (ref 135–145)

## 2019-06-05 LAB — CBC
HCT: 43.8 % (ref 36.0–46.0)
Hemoglobin: 14.1 g/dL (ref 12.0–15.0)
MCH: 29.4 pg (ref 26.0–34.0)
MCHC: 32.2 g/dL (ref 30.0–36.0)
MCV: 91.3 fL (ref 80.0–100.0)
Platelets: 181 10*3/uL (ref 150–400)
RBC: 4.8 MIL/uL (ref 3.87–5.11)
RDW: 13.4 % (ref 11.5–15.5)
WBC: 5.8 10*3/uL (ref 4.0–10.5)
nRBC: 0 % (ref 0.0–0.2)

## 2019-06-05 LAB — SURGICAL PCR SCREEN
MRSA, PCR: NEGATIVE
Staphylococcus aureus: POSITIVE — AB

## 2019-06-05 NOTE — Telephone Encounter (Signed)
Contacted by preadmission testing PA - regarding pt HR in 120s AF. Pt only taking metoprolol as needed - -per Roderic Palau NP restart metoprolol 12.5mg  BID and follow up on Thursday with EKG to confirm rate control. Pt in agreement with plan.

## 2019-06-05 NOTE — Progress Notes (Signed)
PCP - Dr. Cipriano Mile Cardiologist - Dr. Clearnce Hasten  Chest x-ray - 11/02/16 EKG - 06/05/19 Stress Test - no ECHO - 11/03/16 Cardiac Cath - no  Sleep Study - yes CPAP - unable to tolerate the mask she uses a dental device now but plans to Try again with another style of mask after her surgery.  Fasting Blood Sugar - NA Checks Blood Sugar _____ times a day  Blood Thinner Instructions:NA Aspirin Instructions: Last Dose:  Anesthesia review:   Patient denies shortness of breath, fever, cough and chest pain at PAT appointment yes  Patient verbalized understanding of instructions that were given to them at the PAT appointment. Patient was also instructed that they will need to review over the PAT instructions again at home before surgery. Yes Pt's EKG shows she is in A Fib. She has no symptoms. Konrad Felix PA examined the Pt and told her to call her cardiologist today. Pt had not been taking her beta blocker because she thought she could feel when she was in A Fib.

## 2019-06-06 ENCOUNTER — Telehealth (INDEPENDENT_AMBULATORY_CARE_PROVIDER_SITE_OTHER): Payer: BC Managed Care – PPO | Admitting: Internal Medicine

## 2019-06-06 ENCOUNTER — Telehealth (HOSPITAL_COMMUNITY): Payer: Self-pay

## 2019-06-06 ENCOUNTER — Encounter: Payer: Self-pay | Admitting: Internal Medicine

## 2019-06-06 ENCOUNTER — Telehealth: Payer: Self-pay

## 2019-06-06 VITALS — Ht 66.0 in | Wt 213.0 lb

## 2019-06-06 DIAGNOSIS — Z01810 Encounter for preprocedural cardiovascular examination: Secondary | ICD-10-CM | POA: Diagnosis not present

## 2019-06-06 DIAGNOSIS — R0602 Shortness of breath: Secondary | ICD-10-CM | POA: Diagnosis not present

## 2019-06-06 DIAGNOSIS — I480 Paroxysmal atrial fibrillation: Secondary | ICD-10-CM

## 2019-06-06 MED ORDER — METOPROLOL TARTRATE 25 MG PO TABS: 25.0000 mg | ORAL_TABLET | Freq: Two times a day (BID) | ORAL | 3 refills | Status: DC

## 2019-06-06 NOTE — Addendum Note (Signed)
Addended by: Willeen Cass A on: 06/06/2019 09:52 AM   Modules accepted: Orders

## 2019-06-06 NOTE — Telephone Encounter (Signed)
Call placed to Pt.  Advised able to get her scheduled for stress test at [place] 06/07/2019 at 9:30 am  Advised afib appt was rescheduled to 06/07/2019 at 3:00 pm d/t stress test.  Pt indicates understanding.   Advised Pt she would receive call from nuclear dept regarding directions for stress test.

## 2019-06-06 NOTE — Telephone Encounter (Signed)
Encounter complete. 

## 2019-06-06 NOTE — Progress Notes (Signed)
Electrophysiology TeleHealth Note  Due to national recommendations of social distancing due to [place] 19, an audio telehealth visit is felt to be most appropriate for this patient at this time.  She is unable to do Mychart video.  Verbal consent was obtained by me for the telehealth visit today.  The patient does not have capability for a virtual visit.  A phone visit is therefore required today.   Date:  06/06/2019   ID:  Linda Foster, DOB Sep 04, 1956, MRN [id number]  Location: patient's home  Provider location:  [facility]  Evaluation Performed: Follow-up visit  PCP:  Carol Ada, MD   Electrophysiologist:  Dr Rayann Heman  Chief Complaint:  palpitations  History of Present Illness:    Linda Foster is a 63 y.o. female who presents via telehealth conferencing today.  Since last being seen in our clinic, the patient reports doing very well. She is scheduled to have hip replacement 06/15/2019.  She presented for preoperative clearance and was found to have afib.  This same thing happened perioperatively in 2019.  She is unaware of her afib.  Her chads2vasc score is 1.  She is not felt to require anticoagulation.  She has noticed some mild breathlessness for several months.   She has rare postural dizziness.  Today, she denies symptoms of palpitations, chest pain, lower extremity edema,  presyncope, or syncope.  The patient is otherwise without complaint today.  The patient denies symptoms of fevers, chills, cough, or new SOB worrisome for COVID 19.  Past Medical History:  Diagnosis Date  . A-fib ([place])   . Breast cancer ([place]) 2008   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  . Breast cancer ([place]) 2019   left ER/PR +  . Dysrhythmia    History of Afib  . Family history of adverse reaction to anesthesia    N&V  . Family history of prostate cancer   . Lymphedema 2013  . OSA (obstructive sleep apnea)    does not use CPAP  . Osteoarthritis   . PONV (postoperative nausea and vomiting)      Past Surgical History:  Procedure Laterality Date  . ABDOMINAL HYSTERECTOMY    . BREAST LUMPECTOMY Left 2008  . BREAST LUMPECTOMY WITH RADIOACTIVE SEED LOCALIZATION Left 12/05/2017   Procedure: BREAST LUMPECTOMY WITH RADIOACTIVE SEED LOCALIZATION;  Surgeon: Rolm Bookbinder, MD;  Location: [place];  Service: General;  Laterality: Left;  . BREAST RECONSTRUCTION Left 2019   no BP on Lt arm  . CESAREAN SECTION      x 3  . KNEE ARTHROSCOPY Right 2007    Current Outpatient Medications  Medication Sig Dispense Refill  . anastrozole (ARIMIDEX) 1 MG tablet Take 1 tablet (1 mg total) by mouth daily. 90 tablet 4  . Cholecalciferol (VITAMIN D3) 2000 units TABS Take 2,000 Units by mouth daily.    . Cyanocobalamin (B-12 PO) Take 1 tablet by mouth daily.    Marland Kitchen etodolac (LODINE) 400 MG tablet Take 1 tablet (400 mg total) by mouth 2 (two) times daily as needed. 60 tablet 3  . furosemide (LASIX) 40 MG tablet Take 1 tablet (40 mg total) by mouth daily. 90 tablet 3  . Glucosamine-Chondroit-Vit C-Mn (GLUCOSAMINE 1500 COMPLEX PO) Take 3,000 mg by mouth daily.    Marland Kitchen ibuprofen (ADVIL,MOTRIN) 200 MG tablet Take 400 mg by mouth every 6 (six) hours as needed for moderate pain.     . metoprolol tartrate (LOPRESSOR) 25 MG tablet Take 12.5 mg  by mouth as needed (afib).    . MULTIPLE VITAMIN PO Take 2 tablets by mouth daily.    . naproxen (NAPROSYN) 500 MG tablet Take 500 mg by mouth daily.    . potassium chloride (K-DUR) 10 MEQ tablet Take one tablet (10 meq) by mouth once every other day (Patient taking differently: Take 10 mEq by mouth every 3 (three) days. ) 45 tablet 3   Current Facility-Administered Medications  Medication Dose Route Frequency Provider Last Rate Last Dose  . methylPREDNISolone acetate (DEPO-MEDROL) injection 40 mg  40 mg Intra-articular Once Hilts, Michael, MD        Allergies:   Patient has no known allergies.   Social History:  The patient  reports that she has never  smoked. She has never used smokeless tobacco. She reports that she does not drink alcohol or use drugs.   Family History:  The patient's family history includes COPD in her father; Cancer in her cousin and cousin; Cancer (age of onset: 41) in her cousin; Hypertension in her mother; Lung cancer (age of onset: 29) in her brother; Prostate cancer (age of onset: 70) in her brother; Prostate cancer (age of onset: 35) in her father.   ROS:  Please see the history of present illness.   All other systems are personally reviewed and negative.    Exam:    Vital Signs:  Ht 5\' 6"  (1.676 m)   Wt 213 lb (96.6 kg)   BMI 34.38 kg/m   Well sounding, alert and conversant  Labs/Other Tests and Data Reviewed:    Recent Labs: 06/05/2019: BUN 21; Creatinine, Ser 0.68; Hemoglobin 14.1; Platelets 181; Potassium 4.4; Sodium 140   Wt Readings from Last 3 Encounters:  06/06/19 213 lb (96.6 kg)  06/05/19 214 lb (97.1 kg)  06/12/18 217 lb (98.4 kg)     ekg from yesterday is reviewed and reveals afib with V rates 120s.   ASSESSMENT & PLAN:    1.  Atrial fibrillation I suspect that she is in persistent afib Restart metoprolol 25mg  BID Given chads2vasc score of 1, I would not advise anticoagulation at this time We will plan to rate control perioperatively.  After she has recovered from hip surgery, we can then pursue sinus rhythm at that time.  2. Preoperative assessment She has SOB with activity.  I would advise lexiscan myoview for further CV risk stratification.  If low risk, proceed with surgery.    Follow-up:  She has follow-up in AF clinic tomorrow.  Follow closely in the AF clinic perioperatively for rate control.   Patient Risk:  after full review of this patients clinical status, I feel that they are at moderate risk at this time.  Today, I have spent 15 minutes with the patient with telehealth technology discussing arrhythmia management .    Army Fossa, MD  06/06/2019 9:26 AM      [place] [address] [phone] (office) [phone] (fax)

## 2019-06-07 ENCOUNTER — Ambulatory Visit (HOSPITAL_BASED_OUTPATIENT_CLINIC_OR_DEPARTMENT_OTHER)
Admission: RE | Admit: 2019-06-07 | Discharge: 2019-06-07 | Disposition: A | Discharge status: Home / Self Care | Payer: BC Managed Care – PPO | Source: Ambulatory Visit | Attending: Internal Medicine | Admitting: Internal Medicine

## 2019-06-07 ENCOUNTER — Ambulatory Visit (HOSPITAL_COMMUNITY)
Admission: RE | Admit: 2019-06-07 | Discharge: 2019-06-07 | Disposition: A | Discharge status: Home / Self Care | Payer: BC Managed Care – PPO | Source: Ambulatory Visit | Attending: Nurse Practitioner | Admitting: Nurse Practitioner

## 2019-06-07 ENCOUNTER — Other Ambulatory Visit: Payer: Self-pay

## 2019-06-07 ENCOUNTER — Encounter (HOSPITAL_COMMUNITY): Payer: Self-pay | Admitting: Nurse Practitioner

## 2019-06-07 VITALS — BP 118/88 | HR 68 | Ht 66.0 in | Wt 218.6 lb

## 2019-06-07 DIAGNOSIS — Z01810 Encounter for preprocedural cardiovascular examination: Secondary | ICD-10-CM | POA: Diagnosis not present

## 2019-06-07 DIAGNOSIS — Z7901 Long term (current) use of anticoagulants: Secondary | ICD-10-CM | POA: Diagnosis not present

## 2019-06-07 DIAGNOSIS — I890 Lymphedema, not elsewhere classified: Secondary | ICD-10-CM | POA: Insufficient documentation

## 2019-06-07 DIAGNOSIS — Z79899 Other long term (current) drug therapy: Secondary | ICD-10-CM | POA: Insufficient documentation

## 2019-06-07 DIAGNOSIS — E669 Obesity, unspecified: Secondary | ICD-10-CM | POA: Insufficient documentation

## 2019-06-07 DIAGNOSIS — R0602 Shortness of breath: Secondary | ICD-10-CM | POA: Diagnosis not present

## 2019-06-07 DIAGNOSIS — I480 Paroxysmal atrial fibrillation: Secondary | ICD-10-CM | POA: Diagnosis not present

## 2019-06-07 DIAGNOSIS — G4733 Obstructive sleep apnea (adult) (pediatric): Secondary | ICD-10-CM | POA: Insufficient documentation

## 2019-06-07 LAB — MYOCARDIAL PERFUSION IMAGING
LV sys vol: 52 mL
Peak HR: 80 {beats}/min
Rest HR: 55 {beats}/min
SDS: 3
SRS: 0
SSS: 3
TID: 0.92

## 2019-06-07 MED ORDER — REGADENOSON 0.4 MG/5ML IV SOLN
0.4000 mg | Freq: Once | INTRAVENOUS | Status: AC
  Administered 2019-06-07: 0.4 mg via INTRAVENOUS

## 2019-06-07 MED ORDER — TECHNETIUM TC 99M TETROFOSMIN IV KIT
30.6000 | PACK | Freq: Once | INTRAVENOUS | Status: AC | PRN
  Administered 2019-06-07: 30.6 via INTRAVENOUS
  Filled 2019-06-07: qty 31

## 2019-06-07 MED ORDER — TECHNETIUM TC 99M TETROFOSMIN IV KIT
9.8000 | PACK | Freq: Once | INTRAVENOUS | Status: AC | PRN
  Administered 2019-06-07: 9.8 via INTRAVENOUS
  Filled 2019-06-07: qty 10

## 2019-06-07 NOTE — Progress Notes (Signed)
Primary Care Physician: Carol Ada, MD Referring Physician:Dr. Allred( previous Dr. Caryl Comes)   Linda Foster is a 63 y.o. female with a h/o paroxysmal afib that is in the afib clinic for afib being found at time of preop for hip surgery earlier in week..  BB was restarted and she is in SR today.  She cannot tell when she is in afib or SR. She has felt that she has been more short of breath for six months. She had a stress test today for cardiac clearance for hip surgery 10/2.  Not on anticoagulation with CHA2DS2VASc score of 1 for age.  Today, she denies symptoms of palpitations, chest pain, shortness of breath, orthopnea, PND, lower extremity edema, dizziness, presyncope, syncope, or neurologic sequela. The patient is tolerating medications without difficulties and is otherwise without complaint today.   Past Medical History:  Diagnosis Date  . Breast cancer ([place]) 2008   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  . Breast cancer ([place]) 2019   left ER/PR +  . Family history of adverse reaction to anesthesia    N&V  . Family history of prostate cancer   . Lymphedema 2013  . OSA (obstructive sleep apnea)    uses a dental appliance  . Osteoarthritis   . Paroxysmal atrial fibrillation ([facility])    History of Afib  . PONV (postoperative nausea and vomiting)    Past Surgical History:  Procedure Laterality Date  . ABDOMINAL HYSTERECTOMY    . BREAST LUMPECTOMY Left 2008  . BREAST LUMPECTOMY WITH RADIOACTIVE SEED LOCALIZATION Left 12/05/2017   Procedure: BREAST LUMPECTOMY WITH RADIOACTIVE SEED LOCALIZATION;  Surgeon: Rolm Bookbinder, MD;  Location: [place];  Service: General;  Laterality: Left;  . BREAST RECONSTRUCTION Left 2019   no BP on Lt arm  . CESAREAN SECTION      x 3  . KNEE ARTHROSCOPY Right 2007    Current Outpatient Medications  Medication Sig Dispense Refill  . anastrozole (ARIMIDEX) 1 MG tablet Take 1 tablet (1 mg total) by mouth daily. 90 tablet 4  .  Cholecalciferol (VITAMIN D3) 2000 units TABS Take 2,000 Units by mouth daily.    . Cyanocobalamin (B-12 PO) Take 1 tablet by mouth daily.    Marland Kitchen etodolac (LODINE) 400 MG tablet Take 1 tablet (400 mg total) by mouth 2 (two) times daily as needed. 60 tablet 3  . furosemide (LASIX) 40 MG tablet Take 1 tablet (40 mg total) by mouth daily. 90 tablet 3  . Glucosamine-Chondroit-Vit C-Mn (GLUCOSAMINE 1500 COMPLEX PO) Take 3,000 mg by mouth daily.    Marland Kitchen ibuprofen (ADVIL,MOTRIN) 200 MG tablet Take 400 mg by mouth every 6 (six) hours as needed for moderate pain.     . metoprolol tartrate (LOPRESSOR) 25 MG tablet Take 1 tablet (25 mg total) by mouth 2 (two) times daily. 180 tablet 3  . MULTIPLE VITAMIN PO Take 2 tablets by mouth daily.    . naproxen (NAPROSYN) 500 MG tablet Take 500 mg by mouth daily.    . potassium chloride (K-DUR) 10 MEQ tablet Take one tablet (10 meq) by mouth once every other day (Patient taking differently: Take 10 mEq by mouth every 3 (three) days. ) 45 tablet 3   Current Facility-Administered Medications  Medication Dose Route Frequency Provider Last Rate Last Dose  . methylPREDNISolone acetate (DEPO-MEDROL) injection 40 mg  40 mg Intra-articular Once Hilts, Legrand Como, MD        No Known Allergies  Social History   Socioeconomic  History  . Marital status: Married    Spouse name: Not on file  . Number of children: Not on file  . Years of education: Not on file  . Highest education level: Not on file  Occupational History  . Not on file  Social Needs  . Financial resource strain: Not on file  . Food insecurity    Worry: Not on file    Inability: Not on file  . Transportation needs    Medical: Not on file    Non-medical: Not on file  Tobacco Use  . Smoking status: Never Smoker  . Smokeless tobacco: Never Used  Substance and Sexual Activity  . Alcohol use: No  . Drug use: No  . Sexual activity: Not on file  Lifestyle  . Physical activity    Days per week: Not on file     Minutes per session: Not on file  . Stress: Not on file  Relationships  . Social Herbalist on phone: Not on file    Gets together: Not on file    Attends religious service: Not on file    Active member of club or organization: Not on file    Attends meetings of clubs or organizations: Not on file    Relationship status: Not on file  . Intimate partner violence    Fear of current or ex partner: Not on file    Emotionally abused: Not on file    Physically abused: Not on file    Forced sexual activity: Not on file  Other Topics Concern  . Not on file  Social History Narrative  . Not on file    Family History  Problem Relation Age of Onset  . Hypertension Mother   . COPD Father   . Prostate cancer Father 39  . Lung cancer Brother 30  . Prostate cancer Brother 19  . Cancer Cousin        type unk dx. >50  . Cancer Cousin 58       type unk  . Cancer Cousin        type and age dx unkown    ROS- All systems are reviewed and negative except as per the HPI above  Physical Exam: Vitals:   06/07/19 1447  BP: 118/88  Pulse: 68  Weight: 99.2 kg  Height: 5\' 6"  (1.676 m)   Wt Readings from Last 3 Encounters:  06/07/19 99.2 kg  06/07/19 96.6 kg  06/06/19 96.6 kg    Labs: Lab Results  Component Value Date   NA 140 06/05/2019   K 4.4 06/05/2019   CL 106 06/05/2019   CO2 24 06/05/2019   GLUCOSE 110 (H) 06/05/2019   BUN 21 06/05/2019   CREATININE 0.68 06/05/2019   CALCIUM 9.5 06/05/2019   MG 2.2 11/02/2016   Lab Results  Component Value Date   INR 1.02 11/03/2016   Lab Results  Component Value Date   CHOL 232 (H) 11/03/2016   HDL 74 11/03/2016   LDLCALC 145 (H) 11/03/2016   TRIG 63 11/03/2016     GEN- The patient is well appearing, alert and oriented x 3 today.   Head- normocephalic, atraumatic Eyes-  Sclera clear, conjunctiva pink Ears- hearing intact Oropharynx- clear Neck- supple, no JVP Lymph- no cervical lymphadenopathy Lungs- Clear to  ausculation bilaterally, normal work of breathing Heart- Regular rate and rhythm, no murmurs, rubs or gallops, PMI not laterally displaced GI- soft, NT, ND, + BS Extremities- no clubbing,  cyanosis, or edema MS- no significant deformity or atrophy Skin- no rash or lesion Psych- euthymic mood, full affect Neuro- strength and sensation are intact  EKG-NSR at 68 bpm, PR int 140 ms, qrs int 82 ms, qtc 455 ms Epic records reviewed    Assessment and Plan: 1. Paroxysmal afib Afib at time of pre op visit, restarted BB In SR today Continue  metoprolol 25 mg bid,  she should continue perioperatively to discourage afib at  time of surgery, 10/2 Chadsvasc score of 1, does not require anticoagulation  at this time  Results of stress test from  this am pending to give pt pre op clearance per Dr. Rayann Heman F/u here in 6 weeks  Geroge Baseman. Alda Gaultney, [facility] [address] [place], [place] [address] [phone]

## 2019-06-08 ENCOUNTER — Telehealth: Payer: Self-pay | Admitting: Internal Medicine

## 2019-06-08 DIAGNOSIS — I519 Heart disease, unspecified: Secondary | ICD-10-CM

## 2019-06-08 NOTE — Telephone Encounter (Signed)
Left message for Pt per DPR.  Advised Dr. Rayann Heman had not reviewed stress test yet.  Will follow up on Monday.

## 2019-06-08 NOTE — Telephone Encounter (Signed)
Follow Up:   Pt wants to know if her Stress test results are back from yesterday?

## 2019-06-11 NOTE — Telephone Encounter (Signed)
Returned call to Pt.  Stress test results given.  Advised per Dr. Rayann Heman OK to proceed with surgery without further testing.   Needs ECHO, does not need prior to procedure.  Pt indicates understanding

## 2019-06-12 ENCOUNTER — Other Ambulatory Visit (HOSPITAL_COMMUNITY)
Admission: RE | Admit: 2019-06-12 | Discharge: 2019-06-12 | Disposition: A | Discharge status: Home / Self Care | Payer: BC Managed Care – PPO | Source: Ambulatory Visit | Attending: Orthopaedic Surgery | Admitting: Orthopaedic Surgery

## 2019-06-12 ENCOUNTER — Other Ambulatory Visit (HOSPITAL_COMMUNITY): Payer: BLUE CROSS/BLUE SHIELD

## 2019-06-12 DIAGNOSIS — Z20828 Contact with and (suspected) exposure to other viral communicable diseases: Secondary | ICD-10-CM | POA: Insufficient documentation

## 2019-06-12 DIAGNOSIS — Z01812 Encounter for preprocedural laboratory examination: Secondary | ICD-10-CM | POA: Insufficient documentation

## 2019-06-13 LAB — NOVEL CORONAVIRUS, NAA (HOSP ORDER, SEND-OUT TO REF LAB; TAT 18-24 HRS): SARS-CoV-2, NAA: NOT DETECTED

## 2019-06-15 ENCOUNTER — Inpatient Hospital Stay (HOSPITAL_COMMUNITY): Payer: BC Managed Care – PPO

## 2019-06-15 ENCOUNTER — Inpatient Hospital Stay (HOSPITAL_COMMUNITY): Payer: BC Managed Care – PPO | Admitting: Certified Registered"

## 2019-06-15 ENCOUNTER — Encounter (HOSPITAL_COMMUNITY): Payer: Self-pay

## 2019-06-15 ENCOUNTER — Inpatient Hospital Stay (HOSPITAL_COMMUNITY): Payer: BC Managed Care – PPO | Admitting: Physician Assistant

## 2019-06-15 ENCOUNTER — Encounter (HOSPITAL_COMMUNITY)
Admission: EM | Disposition: A | Discharge status: Home Health | Payer: Self-pay | Source: Ambulatory Visit | Attending: Orthopaedic Surgery

## 2019-06-15 ENCOUNTER — Inpatient Hospital Stay (HOSPITAL_COMMUNITY)
Admission: EM | Admit: 2019-06-15 | Discharge: 2019-06-16 | DRG: 470 | Disposition: A | Discharge status: Home Health | Payer: BC Managed Care – PPO | Source: Ambulatory Visit | Attending: Orthopaedic Surgery | Admitting: Orthopaedic Surgery

## 2019-06-15 ENCOUNTER — Other Ambulatory Visit: Payer: Self-pay

## 2019-06-15 DIAGNOSIS — I480 Paroxysmal atrial fibrillation: Secondary | ICD-10-CM | POA: Diagnosis present

## 2019-06-15 DIAGNOSIS — Z853 Personal history of malignant neoplasm of breast: Secondary | ICD-10-CM

## 2019-06-15 DIAGNOSIS — Z471 Aftercare following joint replacement surgery: Secondary | ICD-10-CM | POA: Diagnosis not present

## 2019-06-15 DIAGNOSIS — G4733 Obstructive sleep apnea (adult) (pediatric): Secondary | ICD-10-CM | POA: Diagnosis not present

## 2019-06-15 DIAGNOSIS — M25552 Pain in left hip: Secondary | ICD-10-CM | POA: Diagnosis not present

## 2019-06-15 DIAGNOSIS — Z6835 Body mass index (BMI) 35.0-35.9, adult: Secondary | ICD-10-CM

## 2019-06-15 DIAGNOSIS — M1612 Unilateral primary osteoarthritis, left hip: Secondary | ICD-10-CM | POA: Diagnosis not present

## 2019-06-15 DIAGNOSIS — Z96642 Presence of left artificial hip joint: Secondary | ICD-10-CM

## 2019-06-15 DIAGNOSIS — C50512 Malignant neoplasm of lower-outer quadrant of left female breast: Secondary | ICD-10-CM | POA: Diagnosis not present

## 2019-06-15 DIAGNOSIS — Z419 Encounter for procedure for purposes other than remedying health state, unspecified: Secondary | ICD-10-CM

## 2019-06-15 DIAGNOSIS — I4891 Unspecified atrial fibrillation: Secondary | ICD-10-CM | POA: Diagnosis not present

## 2019-06-15 HISTORY — PX: TOTAL HIP ARTHROPLASTY: SHX124

## 2019-06-15 SURGERY — ARTHROPLASTY, HIP, TOTAL, ANTERIOR APPROACH
Anesthesia: Monitor Anesthesia Care | Site: Hip | Laterality: Left

## 2019-06-15 MED ORDER — SUGAMMADEX SODIUM 200 MG/2ML IV SOLN
INTRAVENOUS | Status: DC | PRN
  Administered 2019-06-15: 200 mg via INTRAVENOUS

## 2019-06-15 MED ORDER — SODIUM CHLORIDE 0.9 % IR SOLN
Status: DC | PRN
  Administered 2019-06-15 (×2): 1000 mL

## 2019-06-15 MED ORDER — FENTANYL CITRATE (PF) 100 MCG/2ML IJ SOLN
INTRAMUSCULAR | Status: DC | PRN
  Administered 2019-06-15 (×2): 100 ug via INTRAVENOUS

## 2019-06-15 MED ORDER — ALUM & MAG HYDROXIDE-SIMETH 200-200-20 MG/5ML PO SUSP: 30.0000 mL | ORAL | Status: DC | PRN

## 2019-06-15 MED ORDER — ONDANSETRON HCL 4 MG/2ML IJ SOLN
INTRAMUSCULAR | Status: DC | PRN
  Administered 2019-06-15: 4 mg via INTRAVENOUS

## 2019-06-15 MED ORDER — KETOROLAC TROMETHAMINE 30 MG/ML IJ SOLN
INTRAMUSCULAR | Status: DC | PRN
  Administered 2019-06-15: 30 mg via INTRAVENOUS

## 2019-06-15 MED ORDER — FENTANYL CITRATE (PF) 100 MCG/2ML IJ SOLN
25.0000 ug | INTRAMUSCULAR | Status: DC | PRN
  Administered 2019-06-15 (×2): 50 ug via INTRAVENOUS

## 2019-06-15 MED ORDER — METOCLOPRAMIDE HCL 5 MG/ML IJ SOLN: 5.0000 mg | Freq: Three times a day (TID) | INTRAMUSCULAR | Status: DC | PRN

## 2019-06-15 MED ORDER — ACETAMINOPHEN 500 MG PO TABS: 1000.0000 mg | ORAL_TABLET | Freq: Once | ORAL | Status: DC | PRN

## 2019-06-15 MED ORDER — OXYCODONE HCL 5 MG PO TABS: 5.0000 mg | ORAL_TABLET | Freq: Once | ORAL | Status: DC | PRN

## 2019-06-15 MED ORDER — LIDOCAINE 2% (20 MG/ML) 5 ML SYRINGE
INTRAMUSCULAR | Status: DC | PRN
  Administered 2019-06-15: 60 mg via INTRAVENOUS

## 2019-06-15 MED ORDER — ONDANSETRON HCL 4 MG/2ML IJ SOLN: 4.0000 mg | Freq: Four times a day (QID) | INTRAMUSCULAR | Status: DC | PRN

## 2019-06-15 MED ORDER — METOCLOPRAMIDE HCL 5 MG PO TABS: 5.0000 mg | ORAL_TABLET | Freq: Three times a day (TID) | ORAL | Status: DC | PRN

## 2019-06-15 MED ORDER — ROCURONIUM BROMIDE 10 MG/ML (PF) SYRINGE
PREFILLED_SYRINGE | INTRAVENOUS | Status: DC | PRN
  Administered 2019-06-15: 40 mg via INTRAVENOUS

## 2019-06-15 MED ORDER — OXYCODONE HCL 5 MG/5ML PO SOLN: 5.0000 mg | Freq: Once | ORAL | Status: DC | PRN

## 2019-06-15 MED ORDER — LACTATED RINGERS IV SOLN
INTRAVENOUS | Status: DC
  Administered 2019-06-15 (×2): via INTRAVENOUS

## 2019-06-15 MED ORDER — MENTHOL 3 MG MT LOZG
1.0000 | LOZENGE | OROMUCOSAL | Status: DC | PRN
  Administered 2019-06-15: 3 mg via ORAL
  Filled 2019-06-15: qty 9

## 2019-06-15 MED ORDER — PANTOPRAZOLE SODIUM 40 MG PO TBEC
40.0000 mg | DELAYED_RELEASE_TABLET | Freq: Every day | ORAL | Status: DC
  Administered 2019-06-16: 10:00:00 40 mg via ORAL
  Filled 2019-06-15: qty 1

## 2019-06-15 MED ORDER — CEFAZOLIN SODIUM-DEXTROSE 1-4 GM/50ML-% IV SOLN
1.0000 g | Freq: Four times a day (QID) | INTRAVENOUS | Status: AC
  Administered 2019-06-15 (×2): 1 g via INTRAVENOUS
  Filled 2019-06-15 (×3): qty 50

## 2019-06-15 MED ORDER — ANASTROZOLE 1 MG PO TABS
1.0000 mg | ORAL_TABLET | Freq: Every day | ORAL | Status: DC
  Administered 2019-06-15: 1 mg via ORAL
  Filled 2019-06-15 (×2): qty 1

## 2019-06-15 MED ORDER — CHLORHEXIDINE GLUCONATE 4 % EX LIQD: 60.0000 mL | Freq: Once | CUTANEOUS | Status: DC

## 2019-06-15 MED ORDER — METHOCARBAMOL 500 MG PO TABS
500.0000 mg | ORAL_TABLET | Freq: Four times a day (QID) | ORAL | Status: DC | PRN
  Administered 2019-06-15: 500 mg via ORAL
  Filled 2019-06-15: qty 1

## 2019-06-15 MED ORDER — METOPROLOL TARTRATE 25 MG PO TABS
25.0000 mg | ORAL_TABLET | Freq: Two times a day (BID) | ORAL | Status: DC
  Administered 2019-06-15 – 2019-06-16 (×2): 25 mg via ORAL
  Filled 2019-06-15 (×2): qty 1

## 2019-06-15 MED ORDER — KETOROLAC TROMETHAMINE 30 MG/ML IJ SOLN
INTRAMUSCULAR | Status: AC
  Filled 2019-06-15: qty 1

## 2019-06-15 MED ORDER — SUCCINYLCHOLINE CHLORIDE 20 MG/ML IJ SOLN
INTRAMUSCULAR | Status: DC | PRN
  Administered 2019-06-15: 100 mg via INTRAVENOUS

## 2019-06-15 MED ORDER — MIDAZOLAM HCL 5 MG/5ML IJ SOLN
INTRAMUSCULAR | Status: DC | PRN
  Administered 2019-06-15: 2 mg via INTRAVENOUS

## 2019-06-15 MED ORDER — ACETAMINOPHEN 325 MG PO TABS
325.0000 mg | ORAL_TABLET | Freq: Four times a day (QID) | ORAL | Status: DC | PRN
  Administered 2019-06-15 – 2019-06-16 (×2): 650 mg via ORAL
  Filled 2019-06-15 (×2): qty 2

## 2019-06-15 MED ORDER — PROPOFOL 10 MG/ML IV BOLUS
INTRAVENOUS | Status: DC | PRN
  Administered 2019-06-15: 160 mg via INTRAVENOUS

## 2019-06-15 MED ORDER — POVIDONE-IODINE 10 % EX SWAB
2.0000 "application " | Freq: Once | CUTANEOUS | Status: AC
  Administered 2019-06-15: 2 via TOPICAL

## 2019-06-15 MED ORDER — PROPOFOL 10 MG/ML IV BOLUS
INTRAVENOUS | Status: AC
  Filled 2019-06-15: qty 60

## 2019-06-15 MED ORDER — ACETAMINOPHEN 10 MG/ML IV SOLN: 1000.0000 mg | Freq: Once | INTRAVENOUS | Status: DC | PRN

## 2019-06-15 MED ORDER — CEFAZOLIN SODIUM-DEXTROSE 2-4 GM/100ML-% IV SOLN
2.0000 g | INTRAVENOUS | Status: AC
  Administered 2019-06-15: 2 g via INTRAVENOUS
  Filled 2019-06-15: qty 100

## 2019-06-15 MED ORDER — FENTANYL CITRATE (PF) 100 MCG/2ML IJ SOLN
INTRAMUSCULAR | Status: AC
  Filled 2019-06-15: qty 2

## 2019-06-15 MED ORDER — SODIUM CHLORIDE 0.9 % IV SOLN: INTRAVENOUS | Status: DC

## 2019-06-15 MED ORDER — FENTANYL CITRATE (PF) 250 MCG/5ML IJ SOLN
INTRAMUSCULAR | Status: AC
  Filled 2019-06-15: qty 5

## 2019-06-15 MED ORDER — ACETAMINOPHEN 10 MG/ML IV SOLN
INTRAVENOUS | Status: AC
  Filled 2019-06-15: qty 100

## 2019-06-15 MED ORDER — ACETAMINOPHEN 10 MG/ML IV SOLN
INTRAVENOUS | Status: DC | PRN
  Administered 2019-06-15: 1000 mg via INTRAVENOUS

## 2019-06-15 MED ORDER — VITAMIN D 25 MCG (1000 UNIT) PO TABS
2000.0000 [IU] | ORAL_TABLET | Freq: Every day | ORAL | Status: DC
  Administered 2019-06-15 – 2019-06-16 (×2): 2000 [IU] via ORAL
  Filled 2019-06-15 (×2): qty 2

## 2019-06-15 MED ORDER — DIPHENHYDRAMINE HCL 12.5 MG/5ML PO ELIX: 12.5000 mg | ORAL_SOLUTION | ORAL | Status: DC | PRN

## 2019-06-15 MED ORDER — ASPIRIN 81 MG PO CHEW
81.0000 mg | CHEWABLE_TABLET | Freq: Two times a day (BID) | ORAL | Status: DC
  Administered 2019-06-15 – 2019-06-16 (×2): 81 mg via ORAL
  Filled 2019-06-15 (×2): qty 1

## 2019-06-15 MED ORDER — OXYCODONE HCL 5 MG PO TABS
10.0000 mg | ORAL_TABLET | ORAL | Status: DC | PRN
  Filled 2019-06-15: qty 2

## 2019-06-15 MED ORDER — FUROSEMIDE 40 MG PO TABS
40.0000 mg | ORAL_TABLET | Freq: Every day | ORAL | Status: DC
  Administered 2019-06-16: 40 mg via ORAL
  Filled 2019-06-15 (×2): qty 1

## 2019-06-15 MED ORDER — ONDANSETRON HCL 4 MG PO TABS: 4.0000 mg | ORAL_TABLET | Freq: Four times a day (QID) | ORAL | Status: DC | PRN

## 2019-06-15 MED ORDER — ACETAMINOPHEN 160 MG/5ML PO SOLN: 1000.0000 mg | Freq: Once | ORAL | Status: DC | PRN

## 2019-06-15 MED ORDER — MIDAZOLAM HCL 2 MG/2ML IJ SOLN
INTRAMUSCULAR | Status: AC
  Filled 2019-06-15: qty 2

## 2019-06-15 MED ORDER — OXYCODONE HCL 5 MG PO TABS
5.0000 mg | ORAL_TABLET | ORAL | Status: DC | PRN
  Administered 2019-06-15 – 2019-06-16 (×2): 5 mg via ORAL
  Filled 2019-06-15: qty 1

## 2019-06-15 MED ORDER — DOCUSATE SODIUM 100 MG PO CAPS
100.0000 mg | ORAL_CAPSULE | Freq: Two times a day (BID) | ORAL | Status: DC
  Administered 2019-06-15 – 2019-06-16 (×2): 100 mg via ORAL
  Filled 2019-06-15 (×2): qty 1

## 2019-06-15 MED ORDER — METHOCARBAMOL 500 MG IVPB - SIMPLE MED
INTRAVENOUS | Status: AC
  Filled 2019-06-15: qty 50

## 2019-06-15 MED ORDER — HYDROMORPHONE HCL 1 MG/ML IJ SOLN: 0.5000 mg | INTRAMUSCULAR | Status: DC | PRN

## 2019-06-15 MED ORDER — POLYETHYLENE GLYCOL 3350 17 G PO PACK: 17.0000 g | PACK | Freq: Every day | ORAL | Status: DC | PRN

## 2019-06-15 MED ORDER — TRANEXAMIC ACID-NACL 1000-0.7 MG/100ML-% IV SOLN
1000.0000 mg | INTRAVENOUS | Status: AC
  Administered 2019-06-15: 1000 mg via INTRAVENOUS
  Filled 2019-06-15: qty 100

## 2019-06-15 MED ORDER — DEXAMETHASONE SODIUM PHOSPHATE 10 MG/ML IJ SOLN
INTRAMUSCULAR | Status: DC | PRN
  Administered 2019-06-15: 5 mg via INTRAVENOUS

## 2019-06-15 MED ORDER — METHOCARBAMOL 500 MG IVPB - SIMPLE MED
500.0000 mg | Freq: Four times a day (QID) | INTRAVENOUS | Status: DC | PRN
  Administered 2019-06-15: 500 mg via INTRAVENOUS
  Filled 2019-06-15: qty 50

## 2019-06-15 MED ORDER — PHENOL 1.4 % MT LIQD: 1.0000 | OROMUCOSAL | Status: DC | PRN

## 2019-06-15 SURGICAL SUPPLY — 48 items
APL SKNCLS STERI-STRIP NONHPOA (GAUZE/BANDAGES/DRESSINGS)
BAG SPEC THK2 15X12 ZIP CLS (MISCELLANEOUS)
BAG ZIPLOCK 12X15 (MISCELLANEOUS) IMPLANT
BALL HIP CERAMIC (Hips) IMPLANT
BENZOIN TINCTURE PRP APPL 2/3 (GAUZE/BANDAGES/DRESSINGS) IMPLANT
BLADE SAW SGTL 18X1.27X75 (BLADE) ×2 IMPLANT
BLADE SURG SZ10 CARB STEEL (BLADE) ×4 IMPLANT
COVER PERINEAL POST (MISCELLANEOUS) ×2 IMPLANT
COVER SURGICAL LIGHT HANDLE (MISCELLANEOUS) ×2 IMPLANT
COVER WAND RF STERILE (DRAPES) IMPLANT
CUP SECTOR GRIPTON 50MM (Cup) ×1 IMPLANT
DRAPE INCISE IOBAN 66X45 STRL (DRAPES) ×1 IMPLANT
DRAPE STERI IOBAN 125X83 (DRAPES) ×2 IMPLANT
DRAPE U-SHAPE 47X51 STRL (DRAPES) ×4 IMPLANT
DRESSING AQUACEL AG SP 3.5X10 (GAUZE/BANDAGES/DRESSINGS) IMPLANT
DRSG AQUACEL AG ADV 3.5X10 (GAUZE/BANDAGES/DRESSINGS) ×2 IMPLANT
DRSG AQUACEL AG SP 3.5X10 (GAUZE/BANDAGES/DRESSINGS) ×2
DURAPREP 26ML APPLICATOR (WOUND CARE) ×2 IMPLANT
ELECT REM PT RETURN 15FT ADLT (MISCELLANEOUS) ×2 IMPLANT
FACESHIELD WRAPAROUND (MASK) ×4 IMPLANT
FACESHIELD WRAPAROUND OR TEAM (MASK) IMPLANT
GAUZE XEROFORM 1X8 LF (GAUZE/BANDAGES/DRESSINGS) ×2 IMPLANT
GLOVE BIO SURGEON STRL SZ7.5 (GLOVE) ×2 IMPLANT
GLOVE BIOGEL PI IND STRL 8 (GLOVE) ×2 IMPLANT
GLOVE BIOGEL PI INDICATOR 8 (GLOVE) ×2
GLOVE ECLIPSE 8.0 STRL XLNG CF (GLOVE) ×2 IMPLANT
GOWN STRL REUS W/TWL XL LVL3 (GOWN DISPOSABLE) ×4 IMPLANT
HANDPIECE INTERPULSE COAX TIP (DISPOSABLE) ×2
HIP BALL CERAMIC (Hips) ×2 IMPLANT
HOLDER FOLEY CATH W/STRAP (MISCELLANEOUS) ×1 IMPLANT
KIT TURNOVER KIT A (KITS) IMPLANT
LINER ACETABULAR 32X50 (Liner) ×1 IMPLANT
NS IRRIG 1000ML POUR BTL (IV SOLUTION) ×1 IMPLANT
PACK ANTERIOR HIP CUSTOM (KITS) ×2 IMPLANT
SET HNDPC FAN SPRY TIP SCT (DISPOSABLE) ×1 IMPLANT
STAPLER VISISTAT 35W (STAPLE) IMPLANT
STEM FEMORAL SZ6 HIGH ACTIS (Stem) ×1 IMPLANT
STRIP CLOSURE SKIN 1/2X4 (GAUZE/BANDAGES/DRESSINGS) IMPLANT
SUT ETHIBOND NAB CT1 #1 30IN (SUTURE) ×2 IMPLANT
SUT ETHILON 2 0 PS N (SUTURE) IMPLANT
SUT MNCRL AB 4-0 PS2 18 (SUTURE) IMPLANT
SUT VIC AB 0 CT1 36 (SUTURE) ×2 IMPLANT
SUT VIC AB 1 CT1 36 (SUTURE) ×2 IMPLANT
SUT VIC AB 2-0 CT1 27 (SUTURE) ×4
SUT VIC AB 2-0 CT1 TAPERPNT 27 (SUTURE) ×2 IMPLANT
TRAY FOLEY MTR SLVR 16FR STAT (SET/KITS/TRAYS/PACK) ×1 IMPLANT
WATER STERILE IRR 1000ML POUR (IV SOLUTION) ×2 IMPLANT
YANKAUER SUCT BULB TIP 10FT TU (MISCELLANEOUS) ×2 IMPLANT

## 2019-06-15 NOTE — Op Note (Signed)
NAME: Linda Foster, Linda Foster MEDICAL RECORD [id number] ACCOUNT [id number] DATE OF BIRTH:1955/11/12 FACILITY: [facility] LOCATION: [facility] PHYSICIAN:Vesper Trant Kerry Fort, MD  OPERATIVE REPORT  DATE OF PROCEDURE:  06/15/2019  PREOPERATIVE DIAGNOSIS:  Primary osteoarthritis and degenerative joint disease, left hip.  POSTOPERATIVE DIAGNOSIS:  Primary osteoarthritis and degenerative joint disease, left hip.  PROCEDURE:  Left total hip arthroplasty through direct anterior approach.  IMPLANTS:  DePuy Sector Gription acetabular component size 50, size 32+0 neutral polyethylene liner, size 6 Actis femoral component with high offset, size 32+5 ceramic hip ball.  SURGEON:  Lind Guest. Ninfa Linden, MD  ASSISTANT:  Erskine Emery, PA-C  ANESTHESIA:  General.  ANTIBIOTICS:  Two grams IV Ancef.  ESTIMATED BLOOD LOSS:  250 mL.  COMPLICATIONS:  None.  INDICATIONS:  The patient is a 63 year old female with debilitating arthritis involving her right hip that has been well documented.  She has tried and failed all forms of conservative treatment and at this point, does wish to proceed with total hip  arthroplasty.  We talked about the risk of acute blood loss anemia, nerve and vessel injury, fracture, infection, dislocation, DVT as well as implant failure.  We talked about the goals being decreased pain, improved mobility and overall improved quality  of life.  DESCRIPTION OF PROCEDURE:  After informed consent was obtained and appropriate left hip was marked, she was brought to the operating room and general anesthesia was obtained while she was on her stretcher.  Traction boots were placed on both her feet.   Next, she was placed supine on the Hana fracture table, the perineal post in place and both legs in line skeletal traction device and no traction applied.  Her left operative hip was prepped and draped with DuraPrep and sterile drapes.  A time-out was  called to identify correct patient and correct left  hip.  We then made an incision just inferior and posterior to the anterior superior iliac spine and carried this obliquely down the leg.  We dissected down tensor fascia lata muscle.  Tensor fascia was  then divided longitudinally to proceed with direct anterior approach to the hip.  We identified and cauterized circumflex vessels.  I then identified the hip capsule, opened the hip capsule in an L-type format, finding moderate joint effusion and  significant osteophytes around the femoral head and neck.  We placed Cobra retractor around the medial and lateral femoral neck and then made our femoral neck cut with an oscillating saw just proximal to the lesser trochanter.  We completed this with an  osteotome.  I placed a corkscrew guide in the femoral head and removed the femoral head in its entirety and found a wide area devoid of cartilage.  I then placed a bent Hohmann over the medial acetabular rim and removed remnants of the acetabular labrum  and other debris.  I then began reaming under direct visualization from a size 44 reamer in stepwise increments up to a size 49 with all reamers under direct visualization, the last reamer under direct fluoroscopy, so we could obtain our depth of  reaming, our inclination and anteversion.  I then placed the real DePuy Sector Gription acetabular component size 50 and a 32+0 neutral polyethylene liner for that size acetabular component.  Attention was then turned to the femur.  With the leg  externally rotated to 120 degrees, extended and adducted, we were able to place a Mueller retractor medially and Hohman retractor behind the greater trochanter.  We released lateral joint capsule and used  a box-cutting osteotome to enter the femoral  canal and a rongeur to lateralize then began broaching using the Actis as broaching system from a size zero going up to a size 6.  With a size 6 in place, we trialed a high offset femoral neck based off her radiographic preoperative  templating.  We went  with a 32+1 hip ball, reduced this in the acetabulum.  We were pleased with stability but we felt we needed just a little bit more leg length and offset.  We dislocated the hip and removed the trial components.  We placed the real high offset Actis  femoral component size 6 and the real 32+5 ceramic hip ball, reduced this in the acetabulum.  We were pleased with the leg length, offset, range of motion and stability assessed radiographically and clinically.  We then irrigated the soft tissue with  normal saline solution using pulsatile lavage.  We closed the joint capsule with interrupted #1 Ethibond suture.  Then we ended up closing the tensor fascia with #1 Vicryl suture followed by 0 Vicryl to close deep tissue, 2-0 Vicryl was used to close the  subcutaneous tissue and interrupted staples were placed on the skin incision.  Xeroform and Aquacel dressing were applied.  She was taken off the Hana table, awakened, extubated, and taken to recovery room in stable condition.  All final counts were  correct.  There were no complications noted.  Note Benita Stabile, PA-C, assisted during the entire case.  His assistance was crucial for facilitating all aspects of this case.  TN/NUANCE  D:06/15/2019 T:06/15/2019 JOB:[id number]

## 2019-06-15 NOTE — Evaluation (Signed)
Physical Therapy Evaluation Patient Details Name: Linda Foster MRN: [id number] DOB: 02/02/1956 Today's Date: 06/15/2019   History of Present Illness  s/p L DA THA  Clinical Impression  Pt is s/p THA resulting in the deficits listed below (see PT Problem List).  PT amb ~75' with RW and min/guard for balance and safety. Anticipate pt will progress well, she is hopeful to d/c home tomorrow   Pt will benefit from skilled PT to increase their independence and safety with mobility to allow discharge to the venue listed below.      Follow Up Recommendations Follow surgeon's recommendation for DC plan and follow-up therapies    Equipment Recommendations  None recommended by PT    Recommendations for Other Services       Precautions / Restrictions Precautions Precautions: Fall Restrictions Weight Bearing Restrictions: No Other Position/Activity Restrictions: WBAT      Mobility  Bed Mobility               General bed mobility comments: OOB in recliner on arrival  Transfers Overall transfer level: Needs assistance Equipment used: Rolling walker (2 wheeled) Transfers: Sit to/from Stand Sit to Stand: Min guard         General transfer comment: cues for hand placement  Ambulation/Gait Ambulation/Gait assistance: Min guard Gait Distance (Feet): 75 Feet Assistive device: Rolling walker (2 wheeled) Gait Pattern/deviations: Step-to pattern     General Gait Details: cues for sequence and RW position  Stairs            Wheelchair Mobility    Modified Rankin (Stroke Patients Only)       Balance                                             Pertinent Vitals/Pain Pain Assessment: 0-10 Pain Score: 1  Pain Descriptors / Indicators: Discomfort;Burning Pain Intervention(s): Limited activity within patient's tolerance;Monitored during session;Premedicated before session;Ice applied    Home Living Family/patient expects to be discharged  to:: Private residence   Available Help at Discharge: Family Type of Home: House Home Access: Stairs to enter   Technical brewer of Steps: 1 Home Layout: One level Home Equipment: Bedside commode;Walker - 2 wheels;Cane - single point      Prior Function Level of Independence: Independent               Hand Dominance        Extremity/Trunk Assessment        Lower Extremity Assessment Lower Extremity Assessment: LLE deficits/detail LLE Deficits / Details: ankle WFL; knee and hip grossly 2+/5, limited by post op pain       Communication   Communication: No difficulties  Cognition Arousal/Alertness: Awake/alert Behavior During Therapy: WFL for tasks assessed/performed Overall Cognitive Status: Within Functional Limits for tasks assessed                                        General Comments      Exercises Total Joint Exercises Ankle Circles/Pumps: AROM;Both;10 reps Quad Sets: AROM;Both;5 reps   Assessment/Plan    PT Assessment Patient needs continued PT services  PT Problem List Decreased strength;Decreased mobility;Decreased activity tolerance;Pain;Decreased knowledge of use of DME       PT Treatment Interventions DME instruction;Therapeutic exercise;Gait training;Functional mobility  training;Therapeutic activities;Patient/family education;Stair training    PT Goals (Current goals can be found in the Care Plan section)  Acute Rehab PT Goals PT Goal Formulation: With patient Time For Goal Achievement: 06/22/19 Potential to Achieve Goals: Good    Frequency 7X/week   Barriers to discharge        Co-evaluation               AM-PAC PT "6 Clicks" Mobility  Outcome Measure Help needed turning from your back to your side while in a flat bed without using bedrails?: A Little Help needed moving from lying on your back to sitting on the side of a flat bed without using bedrails?: A Little Help needed moving to and from a bed  to a chair (including a wheelchair)?: A Little Help needed standing up from a chair using your arms (e.g., wheelchair or bedside chair)?: A Little Help needed to walk in hospital room?: A Little Help needed climbing 3-5 steps with a railing? : A Lot 6 Click Score: 17    End of Session Equipment Utilized During Treatment: Gait belt Activity Tolerance: Patient tolerated treatment well Patient left: in chair;with call bell/phone within reach;with chair alarm set;with family/visitor present   PT Visit Diagnosis: Difficulty in walking, not elsewhere classified (R26.2)    Time: [id number] PT Time Calculation (min) (ACUTE ONLY): 19 min   Charges:   PT Evaluation $PT Eval Low Complexity: 1 Low          Kenyon Ana, PT  Pager: [phone] Acute Rehab Dept [facility]): [phone]   06/15/2019   [facility] 06/15/2019, 2:45 PM

## 2019-06-15 NOTE — Plan of Care (Signed)

## 2019-06-15 NOTE — Anesthesia Preprocedure Evaluation (Addendum)
Anesthesia Evaluation  Patient identified by MRN, date of birth, ID band Patient awake    Reviewed: Allergy & Precautions, NPO status , Patient's Chart, lab work & pertinent test results, reviewed documented beta blocker date and time   History of Anesthesia Complications (+) PONV, Family history of anesthesia reaction and history of anesthetic complications  Airway Mallampati: II  TM Distance: >3 FB Neck ROM: Full    Dental  (+) Dental Advisory Given   Pulmonary sleep apnea , neg recent URI,    breath sounds clear to auscultation       Cardiovascular + dysrhythmias Atrial Fibrillation  Rhythm:Regular  05/2019 stress:   The left ventricular ejection fraction is moderately decreased (30-44%).  Nuclear stress EF: 44%. Diffuse hypokinesis  There was no ST segment deviation noted during stress.  Defect 1: There is a small defect of mild severity present in the apical anterior and apex location. No ischemia. Likely breast attenuation.  This is an intermediate risk study related to reduced calculated EF. No ischemia   Neuro/Psych negative neurological ROS  negative psych ROS   GI/Hepatic negative GI ROS, Neg liver ROS,   Endo/Other  Morbid obesity  Renal/GU negative Renal ROS     Musculoskeletal  (+) Arthritis ,   Abdominal   Peds  Hematology negative hematology ROS (+)   Anesthesia Other Findings Returned call to Pt.  Stress test results given.  Advised per Dr. Rayann Heman OK to proceed with surgery without further testing.   Needs ECHO, does not need prior to procedure.  Pt indicates understanding   Reproductive/Obstetrics                            Anesthesia Physical Anesthesia Plan  ASA: II  Anesthesia Plan: MAC and Spinal   Post-op Pain Management:    Induction: Intravenous  PONV Risk Score and Plan: 3 and Treatment may vary due to age or medical condition and Propofol  infusion  Airway Management Planned: Nasal Cannula  Additional Equipment: None  Intra-op Plan:   Post-operative Plan:   Informed Consent: I have reviewed the patients History and Physical, chart, labs and discussed the procedure including the risks, benefits and alternatives for the proposed anesthesia with the patient or authorized representative who has indicated his/her understanding and acceptance.     Dental advisory given  Plan Discussed with: CRNA and Surgeon  Anesthesia Plan Comments:         Anesthesia Quick Evaluation

## 2019-06-15 NOTE — H&P (Signed)
TOTAL HIP ADMISSION H&P  Patient is admitted for left total hip arthroplasty.  Subjective:  Chief Complaint: left hip pain  HPI: Linda Foster, 63 y.o. female, has a history of pain and functional disability in the left hip(s) due to arthritis and patient has failed non-surgical conservative treatments for greater than 12 weeks to include NSAID's and/or analgesics, corticosteriod injections, use of assistive devices, weight reduction as appropriate and activity modification.  Onset of symptoms was gradual starting 2 years ago with gradually worsening course since that time.The patient noted no past surgery on the left hip(s).  Patient currently rates pain in the left hip at 10 out of 10 with activity. Patient has night pain, worsening of pain with activity and weight bearing, trendelenberg gait, pain that interfers with activities of daily living and pain with passive range of motion. Patient has evidence of subchondral cysts, subchondral sclerosis, periarticular osteophytes and joint space narrowing by imaging studies. This condition presents safety issues increasing the risk of falls.  There is no current active infection.  Patient Active Problem List   Diagnosis Date Noted  . Unilateral primary osteoarthritis, left hip 06/15/2019  . OSA (obstructive sleep apnea) 02/13/2018  . 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 Family history of prostate cancer 01/02/2018  . Breast cancer, left breast ([place]) 01/02/2018  . Genetic testing 12/15/2017  . 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 History of therapeutic radiation 11/01/2017  . 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 Chest pain with moderate risk of acute coronary syndrome 11/03/2016  . Atrial fibrillation with rapid ventricular response ([place]) 11/02/2016  . History of invasive breast cancer 11/06/2012   Past Medical History:   Diagnosis Date  . 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 Breast cancer ([place]) 2019   left ER/PR +  . Family history of adverse reaction to anesthesia    N&V  . Family history of prostate cancer   . Lymphedema 2013  . OSA (obstructive sleep apnea)    uses a dental appliance  . Osteoarthritis   . Paroxysmal atrial fibrillation ([facility])    History of Afib  . PONV (postoperative nausea and vomiting)     Past Surgical History:  Procedure Laterality Date  . ABDOMINAL HYSTERECTOMY    . BREAST LUMPECTOMY Left 2008  . BREAST LUMPECTOMY WITH RADIOACTIVE SEED LOCALIZATION Left 12/05/2017   Procedure: BREAST LUMPECTOMY WITH RADIOACTIVE SEED LOCALIZATION;  Surgeon: Rolm Bookbinder, MD;  Location: [facility];  Service: General;  Laterality: Left;  . BREAST RECONSTRUCTION Left 2019   no BP on Lt arm  . CESAREAN SECTION      x 3  . KNEE ARTHROSCOPY Right 2007    Current Facility-Administered Medications  Medication Dose Route Frequency Provider Last Rate Last Dose  . ceFAZolin (ANCEF) IVPB 2g/100 mL premix  2 g Intravenous On Call to OR Pete Pelt, PA-C      . chlorhexidine (HIBICLENS) 4 % liquid 4 application  60 mL Topical Once Pete Pelt, PA-C      . lactated ringers infusion   Intravenous Continuous Oleta Mouse, MD 10 mL/hr at 06/15/19 0606    . tranexamic acid (CYKLOKAPRON) IVPB 1,000 mg  1,000 mg Intravenous To OR Pete Pelt, PA-C       No Known Allergies  Social History   Tobacco Use  . Smoking  status: Never Smoker  . Smokeless tobacco: Never Used  Substance Use Topics  . Alcohol use: No    Family History  Problem Relation Age of Onset  . Hypertension Mother   . COPD Father   . Prostate cancer Father 56  . Lung cancer Brother 45  . Prostate cancer Brother 56  . Cancer Cousin        type unk dx. >50  . Cancer Cousin 58       type unk  . Cancer Cousin        type and age dx unkown     Review of Systems   Musculoskeletal: Positive for joint pain.  All other systems reviewed and are negative.   Objective:  Physical Exam  Constitutional: She is oriented to person, place, and time. She appears well-developed and well-nourished.  HENT:  Head: Normocephalic and atraumatic.  Eyes: Pupils are equal, round, and reactive to light. EOM are normal.  Neck: Normal range of motion. Neck supple.  Cardiovascular: Normal rate.  Respiratory: Effort normal.  GI: Soft.  Musculoskeletal:     Left hip: She exhibits decreased range of motion, decreased strength, tenderness and bony tenderness.  Neurological: She is alert and oriented to person, place, and time.  Skin: Skin is warm and dry.  Psychiatric: She has a normal mood and affect.    Vital signs in last 24 hours: Temp:  [99.3 F (37.4 C)] 99.3 F (37.4 C) (10/02 0545) Pulse Rate:  [59] 59 (10/02 0545) Resp:  [18] 18 (10/02 0545) BP: (120)/(69) 120/69 (10/02 0545) SpO2:  [100 %] 100 % (10/02 0545) Weight:  [99.2 kg] 99.2 kg (10/02 0536)  Labs:   Estimated body mass index is 35.28 kg/m as calculated from the following:   Height as of this encounter: 5\' 6"  (1.676 m).   Weight as of this encounter: 99.2 kg.   Imaging Review Plain radiographs demonstrate severe degenerative joint disease of the left hip(s). The bone quality appears to be good for age and reported activity level.      Assessment/Plan:  End stage arthritis, left hip(s)  The patient history, physical examination, clinical judgement of the provider and imaging studies are consistent with end stage degenerative joint disease of the left hip(s) and total hip arthroplasty is deemed medically necessary. The treatment options including medical management, injection therapy, arthroscopy and arthroplasty were discussed at length. The risks and benefits of total hip arthroplasty were presented and reviewed. The risks due to aseptic loosening, infection, stiffness,  dislocation/subluxation,  thromboembolic complications and other imponderables were discussed.  The patient acknowledged the explanation, agreed to proceed with the plan and consent was signed. Patient is being admitted for inpatient treatment for surgery, pain control, PT, OT, prophylactic antibiotics, VTE prophylaxis, progressive ambulation and ADL's and discharge planning.The patient is planning to be discharged home with home health services

## 2019-06-15 NOTE — Brief Op Note (Signed)
06/15/2019  8:29 AM  PATIENT:  Linda Foster  63 y.o. female  PRE-OPERATIVE DIAGNOSIS:  osteoarthritis left hip  POST-OPERATIVE DIAGNOSIS:  osteoarthritis left hip  PROCEDURE:  Procedure(s): LEFT TOTAL HIP ARTHROPLASTY ANTERIOR APPROACH (Left)  SURGEON:  Surgeon(s) and Role:    Mcarthur Rossetti, MD - Primary  PHYSICIAN ASSISTANT: Benita Stabile, PA-C  ANESTHESIA:   general  EBL:  250 mL   COUNTS:  YES  DICTATION: .Other Dictation: Dictation Number [id number]  PLAN OF CARE: Admit to inpatient   PATIENT DISPOSITION:  PACU - hemodynamically stable.   Delay start of Pharmacological VTE agent (>24hrs) due to surgical blood loss or risk of bleeding: no

## 2019-06-15 NOTE — Anesthesia Procedure Notes (Signed)
Procedure Name: Intubation Performed by: Gean Maidens, CRNA Pre-anesthesia Checklist: Patient identified, Emergency Drugs available, Suction available, Patient being monitored and Timeout performed Patient Re-evaluated:Patient Re-evaluated prior to induction Oxygen Delivery Method: Circle system utilized Preoxygenation: Pre-oxygenation with 100% oxygen Induction Type: IV induction Ventilation: Mask ventilation without difficulty Laryngoscope Size: Mac and 3 Grade View: Grade I Tube type: Oral Tube size: 7.0 mm Number of attempts: 1 Airway Equipment and Method: Stylet Placement Confirmation: ETT inserted through vocal cords under direct vision,  CO2 detector and breath sounds checked- equal and bilateral Secured at: 21 cm Tube secured with: Tape Dental Injury: Teeth and Oropharynx as per pre-operative assessment

## 2019-06-15 NOTE — Transfer of Care (Signed)
Immediate Anesthesia Transfer of Care Note  Patient: Linda Foster  Procedure(s) Performed: LEFT TOTAL HIP ARTHROPLASTY ANTERIOR APPROACH (Left Hip)  Patient Location: PACU  Anesthesia Type:General  Level of Consciousness: sedated, patient cooperative and responds to stimulation  Airway & Oxygen Therapy: Patient Spontanous Breathing and Patient connected to face mask oxygen  Post-op Assessment: Report given to RN and Post -op Vital signs reviewed and stable  Post vital signs: Reviewed and stable  Last Vitals:  Vitals Value Taken Time  BP 106/67 06/15/19 0855  Temp    Pulse 58 06/15/19 0857  Resp 13 06/15/19 0857  SpO2 100 % 06/15/19 0857  Vitals shown include unvalidated device data.  Last Pain:  Vitals:   06/15/19 0556  TempSrc:   PainSc: 0-No pain         Complications: No apparent anesthesia complications

## 2019-06-15 NOTE — Anesthesia Postprocedure Evaluation (Signed)
Anesthesia Post Note  Patient: Linda Foster  Procedure(s) Performed: LEFT TOTAL HIP ARTHROPLASTY ANTERIOR APPROACH (Left Hip)     Patient location during evaluation: PACU Anesthesia Type: MAC and Spinal Level of consciousness: awake and alert Pain management: pain level controlled Vital Signs Assessment: post-procedure vital signs reviewed and stable Respiratory status: spontaneous breathing, nonlabored ventilation, respiratory function stable and patient connected to nasal cannula oxygen Cardiovascular status: stable and blood pressure returned to baseline Postop Assessment: no apparent nausea or vomiting Anesthetic complications: no    Last Vitals:  Vitals:   06/15/19 1234 06/15/19 1339  BP: 101/64 114/70  Pulse: (!) 56 66  Resp: 14 15  Temp: (!) 36.4 C 36.7 C  SpO2: 100% 100%    Last Pain:  Vitals:   06/15/19 1339  TempSrc: Oral  PainSc:                  [name]

## 2019-06-16 LAB — CBC
HCT: 33 % — ABNORMAL LOW (ref 36.0–46.0)
Hemoglobin: 10.3 g/dL — ABNORMAL LOW (ref 12.0–15.0)
MCH: 29.3 pg (ref 26.0–34.0)
MCHC: 31.2 g/dL (ref 30.0–36.0)
MCV: 93.8 fL (ref 80.0–100.0)
Platelets: 144 10*3/uL — ABNORMAL LOW (ref 150–400)
RBC: 3.52 MIL/uL — ABNORMAL LOW (ref 3.87–5.11)
RDW: 13.8 % (ref 11.5–15.5)
WBC: 9.4 10*3/uL (ref 4.0–10.5)
nRBC: 0 % (ref 0.0–0.2)

## 2019-06-16 LAB — BASIC METABOLIC PANEL
Anion gap: 9 (ref 5–15)
BUN: 19 mg/dL (ref 8–23)
CO2: 21 mmol/L — ABNORMAL LOW (ref 22–32)
Calcium: 8.8 mg/dL — ABNORMAL LOW (ref 8.9–10.3)
Chloride: 109 mmol/L (ref 98–111)
Creatinine, Ser: 0.68 mg/dL (ref 0.44–1.00)
GFR calc Af Amer: >60 mL/min (ref >60)
GFR calc non Af Amer: >60 mL/min (ref >60)
Glucose, Bld: 127 mg/dL — ABNORMAL HIGH (ref 70–99)
Potassium: 3.9 mmol/L (ref 3.5–5.1)
Sodium: 139 mmol/L (ref 135–145)

## 2019-06-16 MED ORDER — ASPIRIN 81 MG PO CHEW: 81.0000 mg | CHEWABLE_TABLET | Freq: Two times a day (BID) | ORAL | 0 refills | Status: DC

## 2019-06-16 MED ORDER — METHOCARBAMOL 500 MG PO TABS: 500.0000 mg | ORAL_TABLET | Freq: Four times a day (QID) | ORAL | 1 refills | Status: DC | PRN

## 2019-06-16 MED ORDER — OXYCODONE HCL 5 MG PO TABS: 5.0000 mg | ORAL_TABLET | ORAL | 0 refills | Status: DC | PRN

## 2019-06-16 NOTE — Discharge Summary (Signed)
Discharge Diagnoses:  Principal Problem:   Unilateral primary osteoarthritis, left hip Active Problems:   Status post total replacement of left hip   Surgeries: Procedure(s): LEFT TOTAL HIP ARTHROPLASTY ANTERIOR APPROACH on 06/15/2019    Consultants:   Discharged Condition: Improved  Hospital Course: Linda Foster is an 63 y.o. female who was admitted 06/15/2019 with a chief complaint of osteoarthritis left hip, with a final diagnosis of osteoarthritis left hip.  Patient was brought to the operating room on 06/15/2019 and underwent Procedure(s): LEFT TOTAL HIP ARTHROPLASTY ANTERIOR APPROACH.    Patient was given perioperative antibiotics:  Anti-infectives (From admission, onward)   Start     Dose/Rate Route Frequency Ordered Stop   06/15/19 1400  ceFAZolin (ANCEF) IVPB 1 g/50 mL premix     1 g 100 mL/hr over 30 Minutes Intravenous Every 6 hours 06/15/19 1030 06/15/19 2114   06/15/19 0600  ceFAZolin (ANCEF) IVPB 2g/100 mL premix     2 g 200 mL/hr over 30 Minutes Intravenous On call to O.R. 06/15/19 [id number] 06/15/19 [id number]    .  Patient was given sequential compression devices, early ambulation, and aspirin for DVT prophylaxis.  Recent vital signs:  Patient Vitals for the past 24 hrs:  BP Temp Temp src Pulse Resp SpO2  06/16/19 0851 (!) 106/53 98.6 F (37 C) Oral 76 16 99 %  06/16/19 0526 (!) 109/54 99 F (37.2 C) Oral 73 16 100 %  06/16/19 0049 - 99.5 F (37.5 C) Oral 70 16 95 %  06/15/19 2339 120/62 - - - - -  06/15/19 2152 (!) 113/58 98.6 F (37 C) Oral 79 16 100 %  06/15/19 2058 118/62 - - 75 - -  06/15/19 1739 (!) 118/53 - - 63 16 100 %  06/15/19 1339 114/70 98.1 F (36.7 C) Oral 66 15 100 %  06/15/19 1234 101/64 (!) 97.5 F (36.4 C) Oral (!) 56 14 100 %  06/15/19 1159 102/70 98.3 F (36.8 C) Oral (!) 59 17 100 %  .  Recent laboratory studies: Dg Pelvis Portable  Result Date: 06/15/2019 CLINICAL DATA:  Status post left hip replacement. EXAM: PORTABLE PELVIS 1-2 VIEWS  COMPARISON:  Fluoroscopic images of same day. FINDINGS: Status post left hip arthroplasty. The left femoral and acetabular components appear to be well situated. Expected postoperative changes are seen in the surrounding soft tissues. IMPRESSION: Status post left total hip arthroplasty. Electronically Signed   By: Marijo Conception M.D.   On: 06/15/2019 09:53   Dg C-arm 1-60 Min-no Report  Result Date: 06/15/2019 CLINICAL DATA:  Post left hip replacement. EXAM: OPERATIVE left HIP (WITH PELVIS IF PERFORMED) TECHNIQUE: Fluoroscopic spot image(s) were submitted for interpretation post-operatively. COMPARISON:  Preoperative assessment of 09/18/2018 FINDINGS: Fluoro time 16 seconds. Does 2.3 mGy Four intraoperative spot images show baseline image with bilateral hip degenerative change worse in the left. Subsequent images show changes of left hip arthroplasty without complicating features. Incidental note is made of surgical changes in the pelvis with surgical clips overlying predominately right pelvis. Lucency over left hip in soft tissues related to intraoperative nature submitted images. IMPRESSION: Post left hip arthroplasty, intraoperative spot views without immediate complication. Electronically Signed   By: Zetta Bills M.D.   On: 06/15/2019 08:44   Dg Hip Operative Unilat W Or W/o Pelvis Left  Result Date: 06/15/2019 CLINICAL DATA:  Post left hip replacement. EXAM: OPERATIVE left HIP (WITH PELVIS IF PERFORMED) TECHNIQUE: Fluoroscopic spot image(s) were submitted for interpretation post-operatively. COMPARISON:  Preoperative assessment of 09/18/2018 FINDINGS: Fluoro time 16 seconds. Does 2.3 mGy Four intraoperative spot images show baseline image with bilateral hip degenerative change worse in the left. Subsequent images show changes of left hip arthroplasty without complicating features. Incidental note is made of surgical changes in the pelvis with surgical clips overlying predominately right pelvis.  Lucency over left hip in soft tissues related to intraoperative nature submitted images. IMPRESSION: Post left hip arthroplasty, intraoperative spot views without immediate complication. Electronically Signed   By: Zetta Bills M.D.   On: 06/15/2019 08:44    Discharge Medications:   Allergies as of 06/16/2019   No Known Allergies     Medication List    STOP taking these medications   GLUCOSAMINE 1500 COMPLEX PO   naproxen 500 MG tablet Commonly known as: NAPROSYN     TAKE these medications   anastrozole 1 MG tablet Commonly known as: ARIMIDEX Take 1 tablet (1 mg total) by mouth daily.   aspirin 81 MG chewable tablet Chew 1 tablet (81 mg total) by mouth 2 (two) times daily.   B-12 PO Take 1 tablet by mouth daily.   furosemide 40 MG tablet Commonly known as: LASIX Take 1 tablet (40 mg total) by mouth daily.   ibuprofen 200 MG tablet Commonly known as: ADVIL Take 400 mg by mouth every 6 (six) hours as needed for moderate pain.   methocarbamol 500 MG tablet Commonly known as: ROBAXIN Take 1 tablet (500 mg total) by mouth every 6 (six) hours as needed for muscle spasms.   metoprolol tartrate 25 MG tablet Commonly known as: LOPRESSOR Take 1 tablet (25 mg total) by mouth 2 (two) times daily.   MULTIPLE VITAMIN PO Take 2 tablets by mouth daily.   oxyCODONE 5 MG immediate release tablet Commonly known as: Oxy IR/ROXICODONE Take 1-2 tablets (5-10 mg total) by mouth every 4 (four) hours as needed for moderate pain (pain score 4-6).   potassium chloride 10 MEQ tablet Commonly known as: KLOR-CON Take one tablet (10 meq) by mouth once every other day What changed:   how much to take  how to take this  when to take this  additional instructions   Vitamin D3 50 MCG (2000 UT) Tabs Take 2,000 Units by mouth daily.            Durable Medical Equipment  (From admission, onward)         Start     Ordered   06/15/19 1031  DME 3 n 1  Once     06/15/19 1030    06/15/19 1031  DME Walker rolling  Once    Question:  Patient needs a walker to treat with the following condition  Answer:  Status post total replacement of left hip   06/15/19 1030          Diagnostic Studies: Dg Pelvis Portable  Result Date: 06/15/2019 CLINICAL DATA:  Status post left hip replacement. EXAM: PORTABLE PELVIS 1-2 VIEWS COMPARISON:  Fluoroscopic images of same day. FINDINGS: Status post left hip arthroplasty. The left femoral and acetabular components appear to be well situated. Expected postoperative changes are seen in the surrounding soft tissues. IMPRESSION: Status post left total hip arthroplasty. Electronically Signed   By: Marijo Conception M.D.   On: 06/15/2019 09:53   Dg C-arm 1-60 Min-no Report  Result Date: 06/15/2019 CLINICAL DATA:  Post left hip replacement. EXAM: OPERATIVE left HIP (WITH PELVIS IF PERFORMED) TECHNIQUE: Fluoroscopic spot image(s) were submitted for interpretation  post-operatively. COMPARISON:  Preoperative assessment of 09/18/2018 FINDINGS: Fluoro time 16 seconds. Does 2.3 mGy Four intraoperative spot images show baseline image with bilateral hip degenerative change worse in the left. Subsequent images show changes of left hip arthroplasty without complicating features. Incidental note is made of surgical changes in the pelvis with surgical clips overlying predominately right pelvis. Lucency over left hip in soft tissues related to intraoperative nature submitted images. IMPRESSION: Post left hip arthroplasty, intraoperative spot views without immediate complication. Electronically Signed   By: Zetta Bills M.D.   On: 06/15/2019 08:44   Dg Hip Operative Unilat W Or W/o Pelvis Left  Result Date: 06/15/2019 CLINICAL DATA:  Post left hip replacement. EXAM: OPERATIVE left HIP (WITH PELVIS IF PERFORMED) TECHNIQUE: Fluoroscopic spot image(s) were submitted for interpretation post-operatively. COMPARISON:  Preoperative assessment of 09/18/2018 FINDINGS: Fluoro  time 16 seconds. Does 2.3 mGy Four intraoperative spot images show baseline image with bilateral hip degenerative change worse in the left. Subsequent images show changes of left hip arthroplasty without complicating features. Incidental note is made of surgical changes in the pelvis with surgical clips overlying predominately right pelvis. Lucency over left hip in soft tissues related to intraoperative nature submitted images. IMPRESSION: Post left hip arthroplasty, intraoperative spot views without immediate complication. Electronically Signed   By: Zetta Bills M.D.   On: 06/15/2019 08:44    Patient benefited maximally from their hospital stay and there were no complications.     Disposition: Discharge disposition: 01-Home or Self Care      Discharge Instructions    Call MD / Call 911   Complete by: As directed    If you experience chest pain or shortness of breath, CALL 911 and be transported to the hospital emergency room.  If you develope a fever above 101 F, pus (white drainage) or increased drainage or redness at the wound, or calf pain, call your surgeon's office.   Constipation Prevention   Complete by: As directed    Drink plenty of fluids.  Prune juice may be helpful.  You may use a stool softener, such as Colace (over the counter) 100 mg twice a day.  Use MiraLax (over the counter) for constipation as needed.   Diet - low sodium heart healthy   Complete by: As directed    Increase activity slowly as tolerated   Complete by: As directed      Follow-up Information    Mcarthur Rossetti, MD Follow up in 2 week(s).   Specialty: Orthopedic Surgery Contact information: [place] [address] [phone]            Signed: Newt Minion 06/16/2019, 10:38 AM

## 2019-06-16 NOTE — TOC Initial Note (Signed)
Transition of Care [facility]) - Initial/Assessment Note    Patient Details  Name: Linda Foster MRN: [id number] Date of Birth: 02-Jul-1956  Transition of Care [facility]) CM/SW Contact:    Joaquin Courts, RN Phone Number: 06/16/2019, 11:43 AM  Clinical Narrative:     CM spoke with patient at bedside. [facility] for [place]. Patient reports has rolling walker and 3-in-1 at home.               Expected Discharge Plan: [place] Barriers to Discharge: No Barriers Identified   Patient Goals and CMS Choice Patient states their goals for this hospitalization and ongoing recovery are:: to go home with therapy CMS Medicare.gov Compare Post Acute Care list provided to:: Patient Choice offered to / list presented to : Patient  Expected Discharge Plan and Services Expected Discharge Plan: [place]   Discharge Planning Services: CM Consult Post Acute Care Choice: [place] arrangements for the past 2 months: Single Family Home Expected Discharge Date: 06/16/19               DME Arranged: N/A DME Agency: NA       HH Arranged: PT HH Agency: [facility] [employer]     Representative spoke with at [place]: pre arranged in MD office  Prior Living Arrangements/Services Living arrangements for the past 2 months: [place] with:: Spouse Patient language and need for interpreter reviewed:: Yes Do you feel safe going back to the place where you live?: Yes      Need for Family Participation in Patient Care: Yes (Comment) Care giver support system in place?: Yes (comment)   Criminal Activity/Legal Involvement Pertinent to Current Situation/Hospitalization: No - Comment as needed  Activities of Daily Living Home Assistive Devices/Equipment: Eyeglasses, Raised toilet seat with rails ADL Screening (condition at time of admission) Patient's cognitive ability adequate to safely complete daily  activities?: Yes Is the patient deaf or have difficulty hearing?: No Does the patient have difficulty seeing, even when wearing glasses/contacts?: No Does the patient have difficulty concentrating, remembering, or making decisions?: No Patient able to express need for assistance with ADLs?: Yes Does the patient have difficulty dressing or bathing?: No Independently performs ADLs?: Yes (appropriate for developmental age) Does the patient have difficulty walking or climbing stairs?: No Weakness of Legs: None Weakness of Arms/Hands: None  Permission Sought/Granted   Permission granted to share information with : Yes, Verbal Permission Granted     Permission granted to share info w AGENCY: [facility]        Emotional Assessment Appearance:: Appears stated age Attitude/Demeanor/Rapport: Engaged Affect (typically observed): Accepting Orientation: : Oriented to Place, Oriented to Self, Oriented to  Time, Oriented to Situation   Psych Involvement: No (comment)  Admission diagnosis:  osteoarthritis left hip Patient Active Problem List   Diagnosis Date Noted  . Unilateral primary osteoarthritis, left hip 06/15/2019  . Status post total replacement of left hip 06/15/2019  . OSA (obstructive sleep apnea) 02/13/2018  . 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 Family history of prostate cancer 01/02/2018  . Breast cancer, left breast ([place]) 01/02/2018  . Genetic testing 12/15/2017  . 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 History of therapeutic radiation 11/01/2017  . 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 Chest pain with moderate risk of acute  coronary syndrome 11/03/2016  . Atrial fibrillation with rapid ventricular response ([place]) 11/02/2016  . History of invasive breast cancer 11/06/2012   PCP:  Carol Ada, MD Pharmacy:   [id number] - [place], [place] [place] [address] [place] [address] Phone: [phone] Fax: [phone]     Social Determinants of Health (SDOH) Interventions    Readmission Risk Interventions No flowsheet data found.

## 2019-06-16 NOTE — Progress Notes (Signed)
Physical Therapy Treatment Patient Details Name: Linda Foster MRN: [id number] DOB: [birth year] Today's Date: 06/16/2019    History of Present Illness s/p L DA THA    PT Comments    Pt progressing well, incr pain overall today. Reviewed gait, transfer safety  and stairs. Will see again to review full HEP and pt should be ready to d/c from PT standpoint   Follow Up Recommendations  Follow surgeon's recommendation for DC plan and follow-up therapies     Equipment Recommendations  None recommended by PT    Recommendations for Other Services       Precautions / Restrictions Precautions Precautions: Fall Restrictions Weight Bearing Restrictions: No Other Position/Activity Restrictions: WBAT    Mobility  Bed Mobility               General bed mobility comments: OOB in recliner on arrival  Transfers Overall transfer level: Needs assistance Equipment used: Rolling walker (2 wheeled) Transfers: Sit to/from Stand Sit to Stand: Min guard;Supervision         General transfer comment: cues for hand placement  Ambulation/Gait Ambulation/Gait assistance: Min guard;Supervision Gait Distance (Feet): 100 Feet Assistive device: Rolling walker (2 wheeled) Gait Pattern/deviations: Step-to pattern;Decreased stance time - left;Decreased weight shift to left     General Gait Details: cues for sequence and RW position   Stairs Stairs: Yes   Stair Management: One rail Right;One rail Left;Step to pattern;Sideways Number of Stairs: 2 General stair comments: cues for sequence and technique   Wheelchair Mobility    Modified Rankin (Stroke Patients Only)       Balance                                            Cognition Arousal/Alertness: Awake/alert Behavior During Therapy: WFL for tasks assessed/performed Overall Cognitive Status: Within Functional Limits for tasks assessed                                        Exercises       General Comments        Pertinent Vitals/Pain Pain Assessment: 0-10 Pain Score: 5  Pain Location: L hip Pain Descriptors / Indicators: Sore Pain Intervention(s): Limited activity within patient's tolerance;Monitored during session;Premedicated before session;Repositioned;Patient requesting pain meds-RN notified    Home Living                      Prior Function            PT Goals (current goals can now be found in the care plan section) Acute Rehab PT Goals PT Goal Formulation: With patient Time For Goal Achievement: 06/22/19 Potential to Achieve Goals: Good Progress towards PT goals: Progressing toward goals    Frequency    7X/week      PT Plan Current plan remains appropriate    Co-evaluation              AM-PAC PT "6 Clicks" Mobility   Outcome Measure  Help needed turning from your back to your side while in a flat bed without using bedrails?: A Little Help needed moving from lying on your back to sitting on the side of a flat bed without using bedrails?: A Little Help needed moving to and from a bed to  a chair (including a wheelchair)?: A Little Help needed standing up from a chair using your arms (e.g., wheelchair or bedside chair)?: A Little Help needed to walk in hospital room?: A Little Help needed climbing 3-5 steps with a railing? : A Little 6 Click Score: 18    End of Session Equipment Utilized During Treatment: Gait belt Activity Tolerance: Patient tolerated treatment well Patient left: in chair;with call bell/phone within reach;with chair alarm set   PT Visit Diagnosis: Difficulty in walking, not elsewhere classified (R26.2)     Time: 1119-1140 PT Time Calculation (min) (ACUTE ONLY): 21 min  Charges:  $Gait Training: 8-22 mins                     Kenyon Ana, PT  Pager: [phone] Acute Rehab Dept [facility]): [phone]   06/16/2019    [facility] 06/16/2019, 11:46 AM

## 2019-06-16 NOTE — Progress Notes (Signed)
   06/16/19 1300  PT Visit Information  Last PT Received On 06/16/19  Reviewed HEP. Pt is ready for d/c from PT standpoint  Assistance Needed +1  History of Present Illness s/p L DA THA  Precautions  Precautions Fall  Restrictions  Weight Bearing Restrictions No  Other Position/Activity Restrictions WBAT  Pain Assessment  Pain Assessment 0-10  Pain Score 2  Pain Location L hip  Pain Descriptors / Indicators Sore  Pain Intervention(s) Limited activity within patient's tolerance;Monitored during session;Repositioned  Cognition  Arousal/Alertness Awake/alert  Behavior During Therapy WFL for tasks assessed/performed  Overall Cognitive Status Within Functional Limits for tasks assessed  Bed Mobility  General bed mobility comments OOB in recliner on arrival  Transfers  Overall transfer level Needs assistance  Equipment used Rolling walker (2 wheeled)  Transfers Sit to/from Stand  Sit to Stand Supervision  General transfer comment cues for hand placement  Ambulation/Gait  Ambulation/Gait assistance Supervision  Gait Distance (Feet) 5 Feet  Assistive device Rolling walker (2 wheeled)  Gait Pattern/deviations Step-to pattern;Decreased stance time - left;Decreased weight shift to left  General Gait Details cues for sequence and RW position  Total Joint Exercises  Ankle Circles/Pumps AROM;Both;10 reps  Heel Slides AAROM;Left;10 reps  Hip ABduction/ADduction AAROM;Left;10 reps  Knee Flexion AROM;Left;10 reps;Standing  Long Arc Quad AROM;Left;5 reps;Seated  Marching in Standing AROM;Left;5 reps;Standing  PT - End of Session  Activity Tolerance Patient tolerated treatment well  Patient left in chair;with call bell/phone within reach;with family/visitor present  Nurse Communication Other (comment) (ready for d/c)   PT - Assessment/Plan  PT Plan Current plan remains appropriate  PT Visit Diagnosis Difficulty in walking, not elsewhere classified (R26.2)  PT Frequency (ACUTE ONLY)  7X/week  Follow Up Recommendations Follow surgeon's recommendation for DC plan and follow-up therapies  PT equipment None recommended by PT  AM-PAC PT "6 Clicks" Mobility Outcome Measure (Version 2)  Help needed turning from your back to your side while in a flat bed without using bedrails? 3  Help needed moving from lying on your back to sitting on the side of a flat bed without using bedrails? 3  Help needed moving to and from a bed to a chair (including a wheelchair)? 3  Help needed standing up from a chair using your arms (e.g., wheelchair or bedside chair)? 3  Help needed to walk in hospital room? 3  Help needed climbing 3-5 steps with a railing?  3  6 Click Score 18  Consider Recommendation of Discharge To: Home with [facility]  PT Goal Progression  Progress towards PT goals Progressing toward goals  Acute Rehab PT Goals  PT Goal Formulation With patient  Time For Goal Achievement 06/22/19  Potential to Achieve Goals Good  PT Time Calculation  PT Start Time (ACUTE ONLY) 1318  PT Stop Time (ACUTE ONLY) 1335  PT Time Calculation (min) (ACUTE ONLY) 17 min  PT General Charges  $$ ACUTE PT VISIT 1 Visit  PT Treatments  $Therapeutic Exercise 8-22 mins

## 2019-06-16 NOTE — Plan of Care (Signed)
  Problem: Education: Goal: Knowledge of the prescribed therapeutic regimen will improve Outcome: Progressing   Problem: Activity: Goal: Ability to avoid complications of mobility impairment will improve Outcome: Progressing Goal: Ability to tolerate increased activity will improve Outcome: Progressing   Problem: Pain Management: Goal: Pain level will decrease with appropriate interventions Outcome: Progressing   Problem: Skin Integrity: Goal: Will show signs of wound healing Outcome: Progressing   Problem: Education: Goal: Knowledge of General Education information will improve Description: Including pain rating scale, medication(s)/side effects and non-pharmacologic comfort measures Outcome: Progressing   Problem: Clinical Measurements: Goal: Ability to maintain clinical measurements within normal limits will improve Outcome: Progressing Goal: Will remain free from infection Outcome: Progressing Goal: Respiratory complications will improve Outcome: Progressing   Problem: Activity: Goal: Risk for activity intolerance will decrease Outcome: Progressing

## 2019-06-16 NOTE — Progress Notes (Signed)
Patient ID: KAO MCKEONE, female   DOB: 06/16/1956, 63 y.o.   MRN: [id number] Patient is a 63 year old woman status post left total hip arthroplasty.  Patient is making good progress with therapy anticipate discharge to home today.

## 2019-06-16 NOTE — Progress Notes (Signed)
    Home health agencies that serve [id number].        Home Health Agencies Search Results  Results List Table  Home Health Agency Information Quality of Patient Care Rating Patient Survey Summary Rating  [facility] [phone] 4 out of 5 stars 4 out of 5 stars  [facility] [phone] 3 out of 5 stars 4 out of 5 stars  [facility] [phone] 4  out of 5 stars 3 out of 5 stars  [facility] [phone] 4  out of 5 stars 4 out of 5 stars  [facility] [phone] 4 out of 5 stars 4 out of 5 stars  [facility] [phone] 4 out of 5 stars 4 out of 5 stars  [facility] [phone] 3  out of 5 stars 4 out of 5 stars  [facility] [phone] 2  out of 5 stars 3 out of 5 stars  [facility] [phone] 3 out of 5 stars 4 out of 5 stars  [facility] [phone] 4 out of 5 stars Not Available12  [facility] [phone] 3 out of 5 stars 4 out of 5 stars  [facility] [phone] Not Available5 Not Available12  [facility] [phone] 3  out of 5 stars 3 out of 5 stars  [facility] [phone] 5 out of 5 stars 4 out of 5 stars  [facility] [phone] 3  out of 5 stars 4 out of 5 stars  [facility] [phone] 3  out of 5 stars 3 out of 5 stars  [facility] [phone] 3  out of 5 stars Not Available11  [facility] [phone] 4  out of 5 stars 3 out of 5 stars   Home Health Footnotes  Footnote number Footnote as displayed on Home Health Compare  1 This agency provides services under a federal waiver program to non-traditional, chronic long term population.  2 This agency provides services to a special needs population.  3 Not Available.  4 The number of patient episodes  for this measure is too small to report.  5 This measure currently does not have data or provider has been certified/recertified for less than 6 months.  6 The national average for this measure is not provided because of state-to-state differences in data collection.  7 Medicare is not displaying rates for this measure for any home health agency, because of an issue with the data.  8 There were problems with the data and they are being corrected.  9 Zero, or very few, patients met the survey's rules for inclusion. The scores shown, if any, reflect a very small number of surveys and may not accurately tell how an agency is doing.  10 Survey results are based on less than 12 months of data.  11 Fewer than 70 patients completed the survey. Use the scores shown, if any, with caution as the number of surveys may be too low to accurately tell how an agency is doing.  12 No survey results are available for this period.  13 Data suppressed by CMS for one or more quarters.    

## 2019-06-16 NOTE — Discharge Instructions (Signed)

## 2019-06-18 ENCOUNTER — Encounter (HOSPITAL_COMMUNITY): Payer: Self-pay | Admitting: Orthopaedic Surgery

## 2019-06-18 DIAGNOSIS — M1611 Unilateral primary osteoarthritis, right hip: Secondary | ICD-10-CM | POA: Diagnosis not present

## 2019-06-18 DIAGNOSIS — Z471 Aftercare following joint replacement surgery: Secondary | ICD-10-CM | POA: Diagnosis not present

## 2019-06-18 DIAGNOSIS — Z170 Estrogen receptor positive status [ER+]: Secondary | ICD-10-CM | POA: Diagnosis not present

## 2019-06-18 DIAGNOSIS — I480 Paroxysmal atrial fibrillation: Secondary | ICD-10-CM | POA: Diagnosis not present

## 2019-06-18 DIAGNOSIS — G4733 Obstructive sleep apnea (adult) (pediatric): Secondary | ICD-10-CM | POA: Diagnosis not present

## 2019-06-18 DIAGNOSIS — Z96642 Presence of left artificial hip joint: Secondary | ICD-10-CM | POA: Diagnosis not present

## 2019-06-18 DIAGNOSIS — I890 Lymphedema, not elsewhere classified: Secondary | ICD-10-CM | POA: Diagnosis not present

## 2019-06-18 DIAGNOSIS — C50512 Malignant neoplasm of lower-outer quadrant of left female breast: Secondary | ICD-10-CM | POA: Diagnosis not present

## 2019-06-20 DIAGNOSIS — Z96642 Presence of left artificial hip joint: Secondary | ICD-10-CM | POA: Diagnosis not present

## 2019-06-20 DIAGNOSIS — M1611 Unilateral primary osteoarthritis, right hip: Secondary | ICD-10-CM | POA: Diagnosis not present

## 2019-06-20 DIAGNOSIS — C50512 Malignant neoplasm of lower-outer quadrant of left female breast: Secondary | ICD-10-CM | POA: Diagnosis not present

## 2019-06-20 DIAGNOSIS — I480 Paroxysmal atrial fibrillation: Secondary | ICD-10-CM | POA: Diagnosis not present

## 2019-06-20 DIAGNOSIS — Z170 Estrogen receptor positive status [ER+]: Secondary | ICD-10-CM | POA: Diagnosis not present

## 2019-06-20 DIAGNOSIS — Z471 Aftercare following joint replacement surgery: Secondary | ICD-10-CM | POA: Diagnosis not present

## 2019-06-20 DIAGNOSIS — G4733 Obstructive sleep apnea (adult) (pediatric): Secondary | ICD-10-CM | POA: Diagnosis not present

## 2019-06-20 DIAGNOSIS — I890 Lymphedema, not elsewhere classified: Secondary | ICD-10-CM | POA: Diagnosis not present

## 2019-06-22 DIAGNOSIS — I890 Lymphedema, not elsewhere classified: Secondary | ICD-10-CM | POA: Diagnosis not present

## 2019-06-22 DIAGNOSIS — Z170 Estrogen receptor positive status [ER+]: Secondary | ICD-10-CM | POA: Diagnosis not present

## 2019-06-22 DIAGNOSIS — G4733 Obstructive sleep apnea (adult) (pediatric): Secondary | ICD-10-CM | POA: Diagnosis not present

## 2019-06-22 DIAGNOSIS — M1611 Unilateral primary osteoarthritis, right hip: Secondary | ICD-10-CM | POA: Diagnosis not present

## 2019-06-22 DIAGNOSIS — C50512 Malignant neoplasm of lower-outer quadrant of left female breast: Secondary | ICD-10-CM | POA: Diagnosis not present

## 2019-06-22 DIAGNOSIS — Z96642 Presence of left artificial hip joint: Secondary | ICD-10-CM | POA: Diagnosis not present

## 2019-06-22 DIAGNOSIS — I480 Paroxysmal atrial fibrillation: Secondary | ICD-10-CM | POA: Diagnosis not present

## 2019-06-22 DIAGNOSIS — Z471 Aftercare following joint replacement surgery: Secondary | ICD-10-CM | POA: Diagnosis not present

## 2019-06-25 DIAGNOSIS — Z170 Estrogen receptor positive status [ER+]: Secondary | ICD-10-CM | POA: Diagnosis not present

## 2019-06-25 DIAGNOSIS — G4733 Obstructive sleep apnea (adult) (pediatric): Secondary | ICD-10-CM | POA: Diagnosis not present

## 2019-06-25 DIAGNOSIS — I480 Paroxysmal atrial fibrillation: Secondary | ICD-10-CM | POA: Diagnosis not present

## 2019-06-25 DIAGNOSIS — C50512 Malignant neoplasm of lower-outer quadrant of left female breast: Secondary | ICD-10-CM | POA: Diagnosis not present

## 2019-06-25 DIAGNOSIS — M1611 Unilateral primary osteoarthritis, right hip: Secondary | ICD-10-CM | POA: Diagnosis not present

## 2019-06-25 DIAGNOSIS — I890 Lymphedema, not elsewhere classified: Secondary | ICD-10-CM | POA: Diagnosis not present

## 2019-06-25 DIAGNOSIS — Z471 Aftercare following joint replacement surgery: Secondary | ICD-10-CM | POA: Diagnosis not present

## 2019-06-25 DIAGNOSIS — Z96642 Presence of left artificial hip joint: Secondary | ICD-10-CM | POA: Diagnosis not present

## 2019-06-28 ENCOUNTER — Encounter: Payer: Self-pay | Admitting: Orthopaedic Surgery

## 2019-06-28 ENCOUNTER — Other Ambulatory Visit: Payer: Self-pay

## 2019-06-28 ENCOUNTER — Ambulatory Visit (INDEPENDENT_AMBULATORY_CARE_PROVIDER_SITE_OTHER): Payer: BC Managed Care – PPO | Admitting: Orthopaedic Surgery

## 2019-06-28 DIAGNOSIS — G4733 Obstructive sleep apnea (adult) (pediatric): Secondary | ICD-10-CM | POA: Diagnosis not present

## 2019-06-28 DIAGNOSIS — Z471 Aftercare following joint replacement surgery: Secondary | ICD-10-CM | POA: Diagnosis not present

## 2019-06-28 DIAGNOSIS — I480 Paroxysmal atrial fibrillation: Secondary | ICD-10-CM | POA: Diagnosis not present

## 2019-06-28 DIAGNOSIS — Z170 Estrogen receptor positive status [ER+]: Secondary | ICD-10-CM | POA: Diagnosis not present

## 2019-06-28 DIAGNOSIS — M1611 Unilateral primary osteoarthritis, right hip: Secondary | ICD-10-CM | POA: Diagnosis not present

## 2019-06-28 DIAGNOSIS — Z96642 Presence of left artificial hip joint: Secondary | ICD-10-CM

## 2019-06-28 DIAGNOSIS — C50512 Malignant neoplasm of lower-outer quadrant of left female breast: Secondary | ICD-10-CM | POA: Diagnosis not present

## 2019-06-28 DIAGNOSIS — I890 Lymphedema, not elsewhere classified: Secondary | ICD-10-CM | POA: Diagnosis not present

## 2019-06-28 NOTE — Progress Notes (Signed)
The patient is 2 weeks tomorrow status post a left total hip arthroplasty.  She is making progress.  She has 1 more home therapy visit.  She is using a cane.  On examination her incision looks good.  The staples are removed and Steri-Strips applied.  There is a small area at the top of her incision that will need some triple antibiotic ointment on it daily after she showers just for a few weeks but it should heal up fine.  Leg lengths are equal.  She tolerates me putting her through range of motion.  There is no significant seroma.  She will go down to an aspirin once a day for a week and then can stop her aspirin.  All question concerns were answered and addressed.  We will see her back in 4 weeks to see how she is doing overall.  I do feel that she can just transition to a home exercise program but if she ends up needing physical therapy as an outpatient they will let us know.

## 2019-06-30 DIAGNOSIS — I890 Lymphedema, not elsewhere classified: Secondary | ICD-10-CM | POA: Diagnosis not present

## 2019-06-30 DIAGNOSIS — I480 Paroxysmal atrial fibrillation: Secondary | ICD-10-CM | POA: Diagnosis not present

## 2019-06-30 DIAGNOSIS — M1611 Unilateral primary osteoarthritis, right hip: Secondary | ICD-10-CM | POA: Diagnosis not present

## 2019-06-30 DIAGNOSIS — Z170 Estrogen receptor positive status [ER+]: Secondary | ICD-10-CM | POA: Diagnosis not present

## 2019-06-30 DIAGNOSIS — G4733 Obstructive sleep apnea (adult) (pediatric): Secondary | ICD-10-CM | POA: Diagnosis not present

## 2019-06-30 DIAGNOSIS — C50512 Malignant neoplasm of lower-outer quadrant of left female breast: Secondary | ICD-10-CM | POA: Diagnosis not present

## 2019-06-30 DIAGNOSIS — Z96642 Presence of left artificial hip joint: Secondary | ICD-10-CM | POA: Diagnosis not present

## 2019-06-30 DIAGNOSIS — Z471 Aftercare following joint replacement surgery: Secondary | ICD-10-CM | POA: Diagnosis not present

## 2019-07-04 ENCOUNTER — Ambulatory Visit (HOSPITAL_COMMUNITY): Payer: BC Managed Care – PPO | Attending: Cardiology

## 2019-07-04 ENCOUNTER — Other Ambulatory Visit: Payer: Self-pay

## 2019-07-04 DIAGNOSIS — I519 Heart disease, unspecified: Secondary | ICD-10-CM

## 2019-07-22 ENCOUNTER — Other Ambulatory Visit: Payer: Self-pay | Admitting: Orthopaedic Surgery

## 2019-07-23 NOTE — Telephone Encounter (Signed)
She doesn't need to refill this does she?

## 2019-07-25 ENCOUNTER — Encounter: Payer: Self-pay | Admitting: Orthopaedic Surgery

## 2019-07-25 ENCOUNTER — Ambulatory Visit (INDEPENDENT_AMBULATORY_CARE_PROVIDER_SITE_OTHER): Payer: BC Managed Care – PPO | Admitting: Orthopaedic Surgery

## 2019-07-25 ENCOUNTER — Other Ambulatory Visit: Payer: Self-pay

## 2019-07-25 DIAGNOSIS — Z96642 Presence of left artificial hip joint: Secondary | ICD-10-CM

## 2019-07-25 NOTE — Progress Notes (Signed)
Office Visit Note   Patient: Linda Foster           Date of Birth: Dec 03, 1955           MRN: [id number] Visit Date: 07/25/2019              Requested by: Linda Foster, [address],  [place] [address] PCP: Linda Ada, MD   Assessment & Plan: Visit Diagnoses:  1. Status post total replacement of left hip     Plan: We will have her stop all exercises except for walking.  I had like her to take Advil several times day no more than 800 mg 3 times daily though.  Go back on the Robaxin.  She develops any neurologic like radiculopathy may consider Lyrica or Neurontin.  Questions were encouraged and answered at length.  We will see her back in 1 month.  Follow-Up Instructions: Return in about 4 weeks (around 08/22/2019).   Orders:  No orders of the defined types were placed in this encounter.  No orders of the defined types were placed in this encounter.     Procedures: No procedures performed   Clinical Data: No additional findings.   Subjective: Chief Complaint  Patient presents with  . Left Hip - Pain, Routine Post Op    HPI Linda Foster returns today 40 days status post left total hip arthroplasty.  She states she is overall doing well.  However yesterday she awakened and had pain from her groin down into her foot.  She denies any back pain.  She had no injury.  Today she had pain from the left hip to the knee.  Describes the pain as a catching muscle ache-like pain.  Some numbness tingling in her toes yesterday.  She is taking Advil for pain only.  She is ambulating with a cane.   Review of Systems Please see HPI otherwise negative  Objective: Vital Signs: There were no vitals taken for this visit.  Physical Exam Constitutional:      Appearance: She is not ill-appearing or diaphoretic.  Pulmonary:     Effort: Pulmonary effort is normal.  Neurological:     Mental Status: She is alert and oriented to person, place, and time.      Ortho Exam Left hip good range of motion without pain.  Slight tenderness over the trochanteric region both hips.  Left calf supple nontender.  Specialty Comments:  No specialty comments available.  Imaging: No results found.   PMFS History: Patient Active Problem List   Diagnosis Date Noted  . Unilateral primary osteoarthritis, left hip 06/15/2019  . Status post total replacement of left hip 06/15/2019  . OSA (obstructive sleep apnea) 02/13/2018  . 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 Family history of prostate cancer 01/02/2018  . Breast cancer, left breast ([place]) 01/02/2018  . Genetic testing 12/15/2017  . 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 History of therapeutic radiation 11/01/2017  . 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 Chest pain with moderate risk of acute coronary syndrome 11/03/2016  . Atrial fibrillation with rapid ventricular response ([facility]) 11/02/2016  . History of invasive breast cancer 11/06/2012   Past Medical History:  Diagnosis Date  . 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 Breast cancer ([facility]) 2019   left ER/PR +  . Family history  of adverse reaction to anesthesia    N&V  . Family history of prostate cancer   . Lymphedema 2013  . OSA (obstructive sleep apnea)    uses a dental appliance  . Osteoarthritis   . Paroxysmal atrial fibrillation ([facility])    History of Afib  . PONV (postoperative nausea and vomiting)     Family History  Problem Relation Age of Onset  . Hypertension Mother   . COPD Father   . Prostate cancer Father 59  . Lung cancer Brother 77  . Prostate cancer Brother 21  . Cancer Cousin        type unk dx. >50  . Cancer Cousin 58       type unk  . Cancer Cousin        type and age dx unkown    Past Surgical History:  Procedure Laterality Date  . ABDOMINAL HYSTERECTOMY    . BREAST LUMPECTOMY  Left 2008  . BREAST LUMPECTOMY WITH RADIOACTIVE SEED LOCALIZATION Left 12/05/2017   Procedure: BREAST LUMPECTOMY WITH RADIOACTIVE SEED LOCALIZATION;  Surgeon: Rolm Bookbinder, MD;  Location: [facility];  Service: General;  Laterality: Left;  . BREAST RECONSTRUCTION Left 2019   no BP on Lt arm  . CESAREAN SECTION      x 3  . KNEE ARTHROSCOPY Right 2007  . TOTAL HIP ARTHROPLASTY Left 06/15/2019   Procedure: LEFT TOTAL HIP ARTHROPLASTY ANTERIOR APPROACH;  Surgeon: Mcarthur Rossetti, MD;  Location: [facility];  Service: Orthopedics;  Laterality: Left;   Social History   Occupational History  . Not on file  Tobacco Use  . Smoking status: Never Smoker  . Smokeless tobacco: Never Used  Substance and Sexual Activity  . Alcohol use: No  . Drug use: No  . Sexual activity: Not on file

## 2019-07-26 ENCOUNTER — Ambulatory Visit: Payer: BC Managed Care – PPO | Admitting: Orthopaedic Surgery

## 2019-08-21 DIAGNOSIS — H02831 Dermatochalasis of right upper eyelid: Secondary | ICD-10-CM | POA: Diagnosis not present

## 2019-08-21 DIAGNOSIS — H2513 Age-related nuclear cataract, bilateral: Secondary | ICD-10-CM | POA: Diagnosis not present

## 2019-08-21 DIAGNOSIS — H5203 Hypermetropia, bilateral: Secondary | ICD-10-CM | POA: Diagnosis not present

## 2019-08-21 DIAGNOSIS — H52223 Regular astigmatism, bilateral: Secondary | ICD-10-CM | POA: Diagnosis not present

## 2019-08-21 DIAGNOSIS — H1045 Other chronic allergic conjunctivitis: Secondary | ICD-10-CM | POA: Diagnosis not present

## 2019-08-21 DIAGNOSIS — H11043 Peripheral pterygium, stationary, bilateral: Secondary | ICD-10-CM | POA: Diagnosis not present

## 2019-08-21 DIAGNOSIS — H524 Presbyopia: Secondary | ICD-10-CM | POA: Diagnosis not present

## 2019-08-22 ENCOUNTER — Ambulatory Visit (INDEPENDENT_AMBULATORY_CARE_PROVIDER_SITE_OTHER): Payer: BC Managed Care – PPO | Admitting: Orthopaedic Surgery

## 2019-08-22 ENCOUNTER — Encounter: Payer: Self-pay | Admitting: Orthopaedic Surgery

## 2019-08-22 ENCOUNTER — Other Ambulatory Visit: Payer: Self-pay

## 2019-08-22 DIAGNOSIS — Z96642 Presence of left artificial hip joint: Secondary | ICD-10-CM

## 2019-08-22 NOTE — Progress Notes (Signed)
The patient comes in today at about 7 weeks status post a left total hip arthroplasty.  She is a very active 63 year old female.  She says her hip is doing very well.  She does have some stiffness when she has been sitting for a while.  She still has problems with crossing her leg and putting her shoes on but she said she is made a lot of progress.  She says it is way better than it was before surgery.  She has been having some right heel pain when she first gets up in the morning.  On examination of her right and left hips to both move smoothly and fluidly and are equal.  Her left operative hip seems to doing very well.  She does have some pain at the plantar fascia on her right side which is consistent with plantar fasciitis.  I talked her about stretching exercises that she can try as well as over-the-counter Voltaren gel.  From hip standpoint we do not need to see her back now for 6 months unless she is having any other issues.  She does have some knee problems and says that she will address these accordingly when he gets worse.  When I do see her back in 6 months I would like a standing low AP pelvis and lateral of her left operative hip.  All question concerns were answered and addressed.

## 2019-08-28 ENCOUNTER — Encounter (HOSPITAL_COMMUNITY): Payer: Self-pay | Admitting: Nurse Practitioner

## 2019-08-28 ENCOUNTER — Other Ambulatory Visit: Payer: Self-pay

## 2019-08-28 ENCOUNTER — Ambulatory Visit (HOSPITAL_COMMUNITY)
Admission: RE | Admit: 2019-08-28 | Discharge: 2019-08-28 | Disposition: A | Discharge status: Home / Self Care | Payer: BC Managed Care – PPO | Source: Ambulatory Visit | Attending: Nurse Practitioner | Admitting: Nurse Practitioner

## 2019-08-28 VITALS — BP 110/60 | HR 57 | Ht 66.0 in | Wt 214.2 lb

## 2019-08-28 DIAGNOSIS — Z8249 Family history of ischemic heart disease and other diseases of the circulatory system: Secondary | ICD-10-CM | POA: Diagnosis not present

## 2019-08-28 DIAGNOSIS — Z9012 Acquired absence of left breast and nipple: Secondary | ICD-10-CM | POA: Insufficient documentation

## 2019-08-28 DIAGNOSIS — Z8042 Family history of malignant neoplasm of prostate: Secondary | ICD-10-CM | POA: Diagnosis not present

## 2019-08-28 DIAGNOSIS — I480 Paroxysmal atrial fibrillation: Secondary | ICD-10-CM | POA: Insufficient documentation

## 2019-08-28 DIAGNOSIS — Z853 Personal history of malignant neoplasm of breast: Secondary | ICD-10-CM | POA: Insufficient documentation

## 2019-08-28 DIAGNOSIS — Z79811 Long term (current) use of aromatase inhibitors: Secondary | ICD-10-CM | POA: Insufficient documentation

## 2019-08-28 DIAGNOSIS — Z79899 Other long term (current) drug therapy: Secondary | ICD-10-CM | POA: Insufficient documentation

## 2019-08-28 DIAGNOSIS — Z809 Family history of malignant neoplasm, unspecified: Secondary | ICD-10-CM | POA: Diagnosis not present

## 2019-08-28 DIAGNOSIS — R001 Bradycardia, unspecified: Secondary | ICD-10-CM | POA: Insufficient documentation

## 2019-08-28 DIAGNOSIS — G4733 Obstructive sleep apnea (adult) (pediatric): Secondary | ICD-10-CM | POA: Insufficient documentation

## 2019-08-28 DIAGNOSIS — M1990 Unspecified osteoarthritis, unspecified site: Secondary | ICD-10-CM | POA: Insufficient documentation

## 2019-08-28 DIAGNOSIS — Z90710 Acquired absence of both cervix and uterus: Secondary | ICD-10-CM | POA: Insufficient documentation

## 2019-08-28 DIAGNOSIS — Z801 Family history of malignant neoplasm of trachea, bronchus and lung: Secondary | ICD-10-CM | POA: Insufficient documentation

## 2019-08-28 DIAGNOSIS — Z96642 Presence of left artificial hip joint: Secondary | ICD-10-CM | POA: Diagnosis not present

## 2019-08-28 MED ORDER — METOPROLOL TARTRATE 25 MG PO TABS: 12.5000 mg | ORAL_TABLET | Freq: Two times a day (BID) | ORAL | 3 refills | Status: DC

## 2019-08-28 NOTE — Patient Instructions (Signed)
Decrease metoprolol to 1/2 tablet twice a day (12.5mg  twice a day)  If notice increase in palpitations would recommend increasing back up to 25mg  twice a day

## 2019-08-28 NOTE — Progress Notes (Signed)
Primary Care Physician: Carol Ada, MD Referring Physician:Dr. Allred( previous Dr. Caryl Comes)   Linda Foster is a 63 y.o. female with a h/o paroxysmal afib that was seen  in the afib clinic early fall  for afib being found at time of preop for hip surgery .  BB was restarted and she was in SR today when seen in the clinic.  She went on to have her hip surgery and did well without any issues with afib.  She has felt a few flutters since  then but short lived. Not on anticoagulation with CHA2DS2VASc score of 1 for female.  Today, she denies symptoms of palpitations, chest pain, shortness of breath, orthopnea, PND, lower extremity edema, dizziness, presyncope, syncope, or neurologic sequela. The patient is tolerating medications without difficulties and is otherwise without complaint today.   Past Medical History:  Diagnosis Date  . 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 Breast cancer ([facility]) 2019   left ER/PR +  . Family history of adverse reaction to anesthesia    N&V  . Family history of prostate cancer   . Lymphedema 2013  . OSA (obstructive sleep apnea)    uses a dental appliance  . Osteoarthritis   . Paroxysmal atrial fibrillation ([facility])    History of Afib  . PONV (postoperative nausea and vomiting)    Past Surgical History:  Procedure Laterality Date  . ABDOMINAL HYSTERECTOMY    . BREAST LUMPECTOMY Left 2008  . BREAST LUMPECTOMY WITH RADIOACTIVE SEED LOCALIZATION Left 12/05/2017   Procedure: BREAST LUMPECTOMY WITH RADIOACTIVE SEED LOCALIZATION;  Surgeon: Rolm Bookbinder, MD;  Location: [facility];  Service: General;  Laterality: Left;  . BREAST RECONSTRUCTION Left 2019   no BP on Lt arm  . CESAREAN SECTION      x 3  . KNEE ARTHROSCOPY Right 2007  . TOTAL HIP ARTHROPLASTY Left 06/15/2019   Procedure: LEFT TOTAL HIP ARTHROPLASTY ANTERIOR APPROACH;  Surgeon: Mcarthur Rossetti, MD;  Location[facility];  Service: Orthopedics;   Laterality: Left;    Current Outpatient Medications  Medication Sig Dispense Refill  . anastrozole (ARIMIDEX) 1 MG tablet Take 1 tablet (1 mg total) by mouth daily. 90 tablet 4  . Cholecalciferol (VITAMIN D3) 2000 units TABS Take 2,000 Units by mouth daily.    . Cyanocobalamin (B-12 PO) Take 1 tablet by mouth daily.    . furosemide (LASIX) 40 MG tablet Take 1 tablet (40 mg total) by mouth daily. 90 tablet 3  . ibuprofen (ADVIL,MOTRIN) 200 MG tablet Take 400 mg by mouth every 6 (six) hours as needed for moderate pain.     . metoprolol tartrate (LOPRESSOR) 25 MG tablet Take 1 tablet (25 mg total) by mouth 2 (two) times daily. 180 tablet 3  . MULTIPLE VITAMIN PO Take 2 tablets by mouth daily.    . potassium chloride (K-DUR) 10 MEQ tablet Take one tablet (10 meq) by mouth once every other day (Patient taking differently: Take 10 mEq by mouth every 3 (three) days. ) 45 tablet 3   No current facility-administered medications for this encounter.    No Known Allergies  Social History   Socioeconomic History  . Marital status: Married    Spouse name: Not on file  . Number of children: Not on file  . Years of education: Not on file  . Highest education level: Not on file  Occupational History  . Not on file  Tobacco Use  .  Smoking status: Never Smoker  . Smokeless tobacco: Never Used  Substance and Sexual Activity  . Alcohol use: No  . Drug use: No  . Sexual activity: Not on file  Other Topics Concern  . Not on file  Social History Narrative  . Not on file   Social Determinants of Health   Financial Resource Strain:   . Difficulty of Paying Living Expenses: Not on file  Food Insecurity:   . Worried About Charity fundraiser in the Last Year: Not on file  . Ran Out of Food in the Last Year: Not on file  Transportation Needs:   . Lack of Transportation (Medical): Not on file  . Lack of Transportation (Non-Medical): Not on file  Physical Activity:   . Days of Exercise per Week:  Not on file  . Minutes of Exercise per Session: Not on file  Stress:   . Feeling of Stress : Not on file  Social Connections:   . Frequency of Communication with Friends and Family: Not on file  . Frequency of Social Gatherings with Friends and Family: Not on file  . Attends Religious Services: Not on file  . Active Member of Clubs or Organizations: Not on file  . Attends Archivist Meetings: Not on file  . Marital Status: Not on file  Intimate Partner Violence:   . Fear of Current or Ex-Partner: Not on file  . Emotionally Abused: Not on file  . Physically Abused: Not on file  . Sexually Abused: Not on file    Family History  Problem Relation Age of Onset  . Hypertension Mother   . COPD Father   . Prostate cancer Father 47  . Lung cancer Brother 51  . Prostate cancer Brother 56  . Cancer Cousin        type unk dx. >50  . Cancer Cousin 58       type unk  . Cancer Cousin        type and age dx unkown    ROS- All systems are reviewed and negative except as per the HPI above  Physical Exam: There were no vitals filed for this visit. Wt Readings from Last 3 Encounters:  06/15/19 99.2 kg  06/07/19 99.2 kg  06/07/19 96.6 kg    Labs: Lab Results  Component Value Date   NA 139 06/16/2019   K 3.9 06/16/2019   CL 109 06/16/2019   CO2 21 (L) 06/16/2019   GLUCOSE 127 (H) 06/16/2019   BUN 19 06/16/2019   CREATININE 0.68 06/16/2019   CALCIUM 8.8 (L) 06/16/2019   MG 2.2 11/02/2016   Lab Results  Component Value Date   INR 1.02 11/03/2016   Lab Results  Component Value Date   CHOL 232 (H) 11/03/2016   HDL 74 11/03/2016   LDLCALC 145 (H) 11/03/2016   TRIG 63 11/03/2016     GEN- The patient is well appearing, alert and oriented x 3 today.   Head- normocephalic, atraumatic Eyes-  Sclera clear, conjunctiva pink Ears- hearing intact Oropharynx- clear Neck- supple, no JVP Lymph- no cervical lymphadenopathy Lungs- Clear to ausculation bilaterally, normal  work of breathing Heart- Regular rate and rhythm, no murmurs, rubs or gallops, PMI not laterally displaced GI- soft, NT, ND, + BS Extremities- no clubbing, cyanosis, or edema MS- no significant deformity or atrophy Skin- no rash or lesion Psych- euthymic mood, full affect Neuro- strength and sensation are intact  EKG-NSR at 57 bpm, PR int 136 ms,  qrs int 80 ms, qtc 455 ms Epic records reviewed    Assessment and Plan: 1. Paroxysmal afib Afib at time of pre op visit, with afib noted,BB started  Appears  to be maintaining SR  Will try to cut metoprolol to 12.5 mg bid, if notices palpitations, go back to 25 mg bid  Chadsvasc score of 1, does not require anticoagulation  at this time  F/u here as needed  Butch Penny C. Graciella Arment, [facility] [address] [place], [place] [address] [phone]

## 2019-08-30 DIAGNOSIS — G4733 Obstructive sleep apnea (adult) (pediatric): Secondary | ICD-10-CM | POA: Diagnosis not present

## 2019-09-12 DIAGNOSIS — G4733 Obstructive sleep apnea (adult) (pediatric): Secondary | ICD-10-CM | POA: Diagnosis not present

## 2019-12-03 DIAGNOSIS — G4733 Obstructive sleep apnea (adult) (pediatric): Secondary | ICD-10-CM | POA: Diagnosis not present

## 2020-01-09 DIAGNOSIS — H6123 Impacted cerumen, bilateral: Secondary | ICD-10-CM | POA: Diagnosis not present

## 2020-01-14 NOTE — Progress Notes (Signed)
Linda Foster  [phone] [phone]     ID: Linda Foster DOB: 1956/08/21  MR#: [id number]  HKV#:[id number]  Patient Care Team: Carol Ada, MD as PCP - General (Family Medicine) Merrilee Compton, MD as Consulting Physician (Obstetrics and Gynecology) Marlou Sa Tonna Corner, MD as Consulting Physician (Orthopedic Surgery) Rolm Bookbinder, MD as Consulting Physician (General Surgery) Vir Whetstine, Virgie Dad, MD as Consulting Physician (Oncology) Irene Limbo, MD as Consulting Physician (Plastic Surgery) Jacinto Reap, MD (Plastic Surgery) Belva Crome, MD as Consulting Physician (Cardiology) Belva Crome, MD as Consulting Physician (Cardiology) Mcarthur Rossetti, MD as Consulting Physician (Orthopedic Surgery) OTHER MD:   CHIEF COMPLAINT: 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  The interval history is generally unremarkable.  She has been doing some babysitting and going to the beach house which she enjoys.  She continues on anastrozole, with good tolerance.  She does not have hot flashes.  She does have some vaginal dryness issues for which she uses various Replens.  This is not entirely satisfactory.  Since her last visit, she underwent right diagnostic mammography with tomography at [facility] on 05/28/2019 showing: breast density heterogeneously dense; no evidence of malignancy.  She also underwent total left hip replacement on 06/15/2019 under Dr. Ninfa Linden.  She did well with the surgery but possibly because of walking in an irregular fashion she has developed problems in the right foot and these are going to be further evaluated later this week by orthopedics   REVIEW OF SYSTEMS: Linda Foster is not exercising regularly.  They do have a swimming pool.  She does not have an  exercise bike.  She tells me she would prefer to switch her mammography back to [facility] since she has no further physician follow-up at [facility].  A detailed review of systems today was otherwise noncontributory  HISTORY OF CURRENT ILLNESS: From the original intake note:  Linda Foster had routine screening mammography on 10/03/2017 showing a possible abnormality in the left breast. She underwent unilateral left breast diagnostic mammography with tomography at [facility] on 10/13/2017 showing: breast density category C. There are new grouped calcifications in the left breast lower outer quadrant and middle depth. Ultrasound of the left axilla on 10/20/2017 showed no abnormalities.   Accordingly on 10/20/2017 she proceeded to biopsy of the left breast area in question. The pathology from this procedure showed ([id number]): Ductal carcinoma in situ, intermediate grade. The largest focus measures 4 mm. The carcinoma is cribriform type with necrosis and associated microcalcifications. Prognostic indicators significant for: estrogen receptor, 95% positive and progesterone receptor, 80% positive, both with strong staining intensity.  Accordingly on 12/05/2017 she proceeded to left lumpectomy.  The pathology [id number]) showed invasive ductal carcinoma, grade 1, measuring 0.3 cm, in addition to low-grade ductal carcinoma in situ.  Both the invasive and in situ components were focally less than a millimeter from the inferior margin.  Note that Linda Foster has a history of prior breast cancer, status post left lumpectomy for what seems to have been a T2 N3 invasive ductal carcinoma, estrogen and progesterone receptor negative.  She was treated with chemotherapy followed by radiation.  Accordingly the patient is not a candidate for repeat radiation and the feasibility or value of sentinel lymph node sampling is questionable.  Linda Foster has been advised by Dr. Donne Hazel that mastectomy is standard in this situation and she  has  met with Dr. Iran Planas to consider reconstruction options.  However the patient is interested in exploring the possibility of lumpectomy.  Her subsequent history is as detailed below.   PAST MEDICAL HISTORY: Past Medical History:  Diagnosis Date  . 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 Breast cancer ([place]) 2019   left ER/PR +  . Family history of adverse reaction to anesthesia    N&V  . Family history of prostate cancer   . Lymphedema 2013  . OSA (obstructive sleep apnea)    uses a dental appliance  . Osteoarthritis   . Paroxysmal atrial fibrillation (HCC)    History of Afib  . PONV (postoperative nausea and vomiting)     PAST SURGICAL HISTORY: Past Surgical History:  Procedure Laterality Date  . ABDOMINAL HYSTERECTOMY    . BREAST LUMPECTOMY Left 2008  . BREAST LUMPECTOMY WITH RADIOACTIVE SEED LOCALIZATION Left 12/05/2017   Procedure: BREAST LUMPECTOMY WITH RADIOACTIVE SEED LOCALIZATION;  Surgeon: Rolm Bookbinder, MD;  Location: [place];  Service: General;  Laterality: Left;  . BREAST RECONSTRUCTION Left 2019   no BP on Lt arm  . CESAREAN SECTION      x 3  . KNEE ARTHROSCOPY Right 2007  . TOTAL HIP ARTHROPLASTY Left 06/15/2019   Procedure: LEFT TOTAL HIP ARTHROPLASTY ANTERIOR APPROACH;  Surgeon: Mcarthur Rossetti, MD;  Location: [facility];  Service: Orthopedics;  Laterality: Left;  She had an episode of  A Fib in 2018. She was being seen by Dr. Oran Rein about 10 years ago.    FAMILY HISTORY Family History  Problem Relation Age of Onset  . Hypertension Mother   . COPD Father   . Prostate cancer Father 9  . Lung cancer Brother 46  . Prostate cancer Brother 25  . Cancer Cousin        type unk dx. >50  . Cancer Cousin 58       type unk  . Cancer Cousin        type and age dx unkown  Very detailed family history is available in the genetics counseling note from 11/14/2017.  The patient has been genetically tested and no  pathogenic mutation was identified (see below).   GYNECOLOGIC HISTORY:  Menarche: 64 years old Age at first live birth: 64 years old The patient is GXP3. She is s/p hysterectomy and USO in 2007. She did not receive HRT. She took oral contraceptives for more than a year remotely with no complications.   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 Linda Foster, her husband of 2 years, owns [employer]. The patient's daughter, Linda Foster, is an Psychologist, prison and probation services at [employer]. The patient's daughter, Linda Foster, is a Probation officer with her own business. The patient's son, Linda Foster, works with Linda Foster at [employer], but he also has a degree in Therapist, sports. The patient has 7 grandchildren. She attends [employer].    ADVANCED DIRECTIVES: In the absence of any documents to the contrary the patient's husband is her healthcare power of attorney   HEALTH MAINTENANCE: Social History   Tobacco Use  . Smoking status: Never Smoker  . Smokeless tobacco: Never Used  Substance Use Topics  . Alcohol use: No  . Drug use: No     Colonoscopy: 10 years ago/ [facility]   PAP:  Bone density: January 2012/ normal    No Known Allergies  Current Outpatient Medications  Medication Sig Dispense Refill  . anastrozole (ARIMIDEX) 1 MG tablet Take 1 tablet (1 mg total) by mouth daily. 90 tablet 4  . Cholecalciferol (VITAMIN D3) 2000 units TABS Take 2,000 Units by mouth daily.    . Cyanocobalamin (B-12 PO) Take 1 tablet by mouth daily.    . furosemide (LASIX) 40 MG tablet Take 1 tablet (40 mg total) by mouth daily. 90 tablet 3  . ibuprofen (ADVIL,MOTRIN) 200 MG tablet Take 400 mg by mouth every 6 (six) hours as needed for moderate pain.     . metoprolol tartrate (LOPRESSOR) 25 MG tablet Take 0.5 tablets (12.5 mg total) by mouth 2 (two) times daily. 180 tablet 3  . MULTIPLE VITAMIN PO Take 2 tablets by mouth daily.    . potassium chloride (K-DUR) 10 MEQ tablet  Take one tablet (10 meq) by mouth once every other day (Patient taking differently: Take 10 mEq by mouth every 3 (three) days. ) 45 tablet 3   No current facility-administered medications for this visit.    OBJECTIVE: white woman who appears well  Vitals:   01/15/20 1418  BP: 120/80  Pulse: 69  Resp: 18  Temp: 99.1 F (37.3 C)  SpO2: 100%     Body mass index is 34.51 kg/m.   Wt Readings from Last 3 Encounters:  01/15/20 213 lb 12.8 oz (97 kg)  08/28/19 214 lb 3.2 oz (97.2 kg)  06/15/19 218 lb 9.6 oz (99.2 kg)      ECOG FS:2 - Symptomatic, <50% confined to bed  Sclerae unicteric, EOMs intact Wearing a mask No cervical or supraclavicular adenopathy Lungs no rales or rhonchi Heart regular rate and rhythm Abd soft, nontender, positive bowel sounds MSK no focal spinal tenderness, no upper extremity lymphedema Neuro: nonfocal, well oriented, appropriate affect Breasts: The right breast is benign.  The left breast is status post mastectomy with D IEP reconstruction.  The cosmetic result is excellent.  There is no evidence of residual recurrent disease.  Both axillae are benign.   LAB RESULTS:  CMP     Component Value Date/Time   NA 139 06/16/2019 0232   NA 143 02/28/2017 0937   K 3.9 06/16/2019 0232   CL 109 06/16/2019 0232   CO2 21 (L) 06/16/2019 0232   GLUCOSE 127 (H) 06/16/2019 0232   BUN 19 06/16/2019 0232   BUN 14 02/28/2017 0937   CREATININE 0.68 06/16/2019 0232   CREATININE 0.82 10/26/2017 1218   CALCIUM 8.8 (L) 06/16/2019 0232   PROT 7.5 10/26/2017 1218   ALBUMIN 4.5 10/26/2017 1218   AST 18 10/26/2017 1218   ALT 15 10/26/2017 1218   ALKPHOS 84 10/26/2017 1218   BILITOT 0.9 10/26/2017 1218   GFRNONAA >60 06/16/2019 0232   GFRNONAA >60 10/26/2017 1218   GFRAA >60 06/16/2019 0232   GFRAA >60 10/26/2017 1218    No results found for: TOTALPROTELP, ALBUMINELP, A1GS, A2GS, BETS, BETA2SER, GAMS, MSPIKE, SPEI  No results found for: KPAFRELGTCHN, LAMBDASER,  [facility]  Lab Results  Component Value Date   WBC 5.3 01/15/2020   NEUTROABS 2.1 01/15/2020   HGB 12.4 01/15/2020   HCT 38.3 01/15/2020   MCV 90.5 01/15/2020   PLT 163 01/15/2020    Lab Results  Component Value Date   LABCA2 30 09/16/2011    No components found for: OEVOJJ009  No results for input(s): INR in the last 168 hours.  Lab Results  Component Value Date   LABCA2 30 09/16/2011    No results  found for: CAN199  No results found for: UXN235  No results found for: TDD220  No results found for: CA2729  No components found for: HGQUANT  No results found for: CEA1 / No results found for: CEA1   No results found for: AFPTUMOR  No results found for: CHROMOGRNA  No results found for: HGBA, HGBA2QUANT, HGBFQUANT, HGBSQUAN (Hemoglobinopathy evaluation)   Lab Results  Component Value Date   LDH 162 09/02/2010    No results found for: IRON, TIBC, IRONPCTSAT (Iron and TIBC)  No results found for: FERRITIN  Urinalysis    Component Value Date/Time   LABSPEC 1.020 10/30/2008 1837   PHURINE 7.5 10/30/2008 1837   GLUCOSEU NEGATIVE 10/30/2008 1837   HGBUR LARGE (A) 10/30/2008 1837   BILIRUBINUR NEGATIVE 10/30/2008 [place] 10/30/2008 1837   PROTEINUR 100 (A) 10/30/2008 1837   UROBILINOGEN 0.2 10/30/2008 1837   NITRITE NEGATIVE 10/30/2008 1837   LEUKOCYTESUR (A) 10/30/2008 1837    MODERATE Biochemical Testing Only. Please order routine urinalysis from main lab if confirmatory testing is needed.    STUDIES: No results found.   ELIGIBLE FOR AVAILABLE RESEARCH PROTOCOL: no  ASSESSMENT: 64 y.o. [employer] woman  (1) status post left lumpectomy and axillary lymph node dissection in 2008 for a T2N3 invasive ductal carcinoma, triple negative, pathology and treatment details not available at present--being retrieved  (a) status post adjuvant chemotherapy  (b) status post radiation  (2) status post left lumpectomy 12/05/2017 for a  pT1a cN0, stage IA invasive ductal carcinoma, estrogen and progesterone receptor positive, HER-2 not amplified  (a) pathology also showed ductal carcinoma in situ, estrogen and progesterone receptor strongly positive  (b) both invasive and noninvasive components are focally less than 1 mm from the inferior margin  (3) genetics testing 11/14/2017 through the STAT Breast cancer panel offered by [facility] found no deleterious mutations in ATM, BRCA1, BRCA2, CDH1, CHEK2, PALB2, PTEN, STK11 and TP53.   (a) reflex genetic testing through the Common Hereditary Cancer Panel offered by [facility] additionally found no deleterious mutations in APC, ATM, AXIN2, BARD1, BMPR1A, BRCA1, BRCA2, BRIP1, CDH1, CDKN2A (p14ARF), CDKN2A (p16INK4a), CKD4, CHEK2, CTNNA1, DICER1, EPCAM (Deletion/duplication testing only), GREM1 (promoter region deletion/duplication testing only), KIT, MEN1, MLH1, MSH2, MSH3, MSH6, MUTYH, NBN, NF1, NHTL1, PALB2, PDGFRA, PMS2, POLD1, POLE, PTEN, RAD50, RAD51C, RAD51D, SDHB, SDHC, SDHD, SMAD4, SMARCA4. STK11, TP53, TSC1, TSC2, and VHL.  The following genes were evaluated for sequence changes only: SDHA and HOXB13 c.251G>A variant only.  (c) variants of uncertain significance and APC and BR IP 1 were identified  (4) anastrozole started 12/25/2017  (a) bone density 10/16/2018 shows a T-score of - 1.4  (5) status post left simple mastectomy with DIEP reconstruction at [facility] 03/07/2018  (a) pathology showed no residual cancer in the surgical sample   PLAN: Linda Foster is now just over 2 years out from her left lumpectomy with no evidence of disease recurrence.  This is very favorable.  We discussed the pelvic floor program and I gave her information on it.  If she wishes to participate she will let me know and I will place a referral for her.  We are switching her mammography back to [facility] at her request and I have entered the order for her to have mammography mid September.  I will then see her in  October and from that point I will start seeing her on a yearly basis.  She knows to call for any other issue that may develop before  her return visit here.  Total encounter time 25 minutes.*  Milen Lengacher, Virgie Dad, MD  01/15/20 2:40 PM Medical Oncology and Hematology [facility] [place], [place] [address] Tel. [phone]    Fax. [phone]   I, Wilburn Mylar, am acting as scribe for Dr. Virgie Dad. Raquell Richer.  I, Lurline Del MD, have reviewed the above documentation for accuracy and completeness, and I agree with the above.   *Total Encounter Time as defined by the Centers for Medicare and Medicaid Services includes, in addition to the face-to-face time of a patient visit (documented in the note above) non-face-to-face time: obtaining and reviewing outside history, ordering and reviewing medications, tests or procedures, care coordination (communications with other health care professionals or caregivers) and documentation in the medical record.

## 2020-01-15 ENCOUNTER — Inpatient Hospital Stay: Payer: BC Managed Care – PPO

## 2020-01-15 ENCOUNTER — Inpatient Hospital Stay: Payer: BC Managed Care – PPO | Attending: Oncology | Admitting: Oncology

## 2020-01-15 ENCOUNTER — Other Ambulatory Visit: Payer: Self-pay

## 2020-01-15 VITALS — BP 120/80 | HR 69 | Temp 99.1°F | Resp 18 | Ht 66.0 in | Wt 213.8 lb

## 2020-01-15 DIAGNOSIS — C50512 Malignant neoplasm of lower-outer quadrant of left female breast: Secondary | ICD-10-CM | POA: Diagnosis not present

## 2020-01-15 DIAGNOSIS — Z9012 Acquired absence of left breast and nipple: Secondary | ICD-10-CM | POA: Diagnosis not present

## 2020-01-15 DIAGNOSIS — D0512 Intraductal carcinoma in situ of left breast: Secondary | ICD-10-CM

## 2020-01-15 DIAGNOSIS — Z853 Personal history of malignant neoplasm of breast: Secondary | ICD-10-CM | POA: Insufficient documentation

## 2020-01-15 DIAGNOSIS — Z79811 Long term (current) use of aromatase inhibitors: Secondary | ICD-10-CM | POA: Insufficient documentation

## 2020-01-15 DIAGNOSIS — Z9221 Personal history of antineoplastic chemotherapy: Secondary | ICD-10-CM | POA: Insufficient documentation

## 2020-01-15 DIAGNOSIS — Z170 Estrogen receptor positive status [ER+]: Secondary | ICD-10-CM | POA: Insufficient documentation

## 2020-01-15 LAB — CBC WITH DIFFERENTIAL/PLATELET
Abs Immature Granulocytes: 0.01 10*3/uL (ref 0.00–0.07)
Basophils Absolute: 0 10*3/uL (ref 0.0–0.1)
Basophils Relative: 1 %
Eosinophils Absolute: 0.1 10*3/uL (ref 0.0–0.5)
Eosinophils Relative: 2 %
HCT: 38.3 % (ref 36.0–46.0)
Hemoglobin: 12.4 g/dL (ref 12.0–15.0)
Immature Granulocytes: 0 %
Lymphocytes Relative: 50 %
Lymphs Abs: 2.7 10*3/uL (ref 0.7–4.0)
MCH: 29.3 pg (ref 26.0–34.0)
MCHC: 32.4 g/dL (ref 30.0–36.0)
MCV: 90.5 fL (ref 80.0–100.0)
Monocytes Absolute: 0.4 10*3/uL (ref 0.1–1.0)
Monocytes Relative: 8 %
Neutro Abs: 2.1 10*3/uL (ref 1.7–7.7)
Neutrophils Relative %: 39 %
Platelets: 163 10*3/uL (ref 150–400)
RBC: 4.23 MIL/uL (ref 3.87–5.11)
RDW: 14.3 % (ref 11.5–15.5)
WBC: 5.3 10*3/uL (ref 4.0–10.5)
nRBC: 0 % (ref 0.0–0.2)

## 2020-01-15 LAB — COMPREHENSIVE METABOLIC PANEL
ALT: 14 U/L (ref 0–44)
AST: 17 U/L (ref 15–41)
Albumin: 3.9 g/dL (ref 3.5–5.0)
Alkaline Phosphatase: 93 U/L (ref 38–126)
Anion gap: 7 (ref 5–15)
BUN: 22 mg/dL (ref 8–23)
CO2: 28 mmol/L (ref 22–32)
Calcium: 9.5 mg/dL (ref 8.9–10.3)
Chloride: 107 mmol/L (ref 98–111)
Creatinine, Ser: 0.84 mg/dL (ref 0.44–1.00)
GFR calc Af Amer: >60 mL/min (ref >60)
GFR calc non Af Amer: >60 mL/min (ref >60)
Glucose, Bld: 88 mg/dL (ref 70–99)
Potassium: 3.8 mmol/L (ref 3.5–5.1)
Sodium: 142 mmol/L (ref 135–145)
Total Bilirubin: 0.9 mg/dL (ref 0.3–1.2)
Total Protein: 6.6 g/dL (ref 6.5–8.1)

## 2020-01-15 MED ORDER — ANASTROZOLE 1 MG PO TABS: 1.0000 mg | ORAL_TABLET | Freq: Every day | ORAL | 4 refills | Status: DC

## 2020-01-16 ENCOUNTER — Ambulatory Visit: Payer: BC Managed Care – PPO | Admitting: Family Medicine

## 2020-01-16 ENCOUNTER — Ambulatory Visit: Payer: Self-pay

## 2020-01-16 ENCOUNTER — Ambulatory Visit (INDEPENDENT_AMBULATORY_CARE_PROVIDER_SITE_OTHER): Payer: BC Managed Care – PPO | Admitting: Orthopaedic Surgery

## 2020-01-16 ENCOUNTER — Encounter: Payer: Self-pay | Admitting: Orthopaedic Surgery

## 2020-01-16 ENCOUNTER — Telehealth: Payer: Self-pay | Admitting: Oncology

## 2020-01-16 ENCOUNTER — Telehealth: Payer: Self-pay

## 2020-01-16 DIAGNOSIS — M1711 Unilateral primary osteoarthritis, right knee: Secondary | ICD-10-CM | POA: Diagnosis not present

## 2020-01-16 DIAGNOSIS — M1712 Unilateral primary osteoarthritis, left knee: Secondary | ICD-10-CM | POA: Diagnosis not present

## 2020-01-16 DIAGNOSIS — G8929 Other chronic pain: Secondary | ICD-10-CM | POA: Diagnosis not present

## 2020-01-16 DIAGNOSIS — M79672 Pain in left foot: Secondary | ICD-10-CM

## 2020-01-16 DIAGNOSIS — M25562 Pain in left knee: Secondary | ICD-10-CM | POA: Diagnosis not present

## 2020-01-16 DIAGNOSIS — M25561 Pain in right knee: Secondary | ICD-10-CM | POA: Diagnosis not present

## 2020-01-16 DIAGNOSIS — M79671 Pain in right foot: Secondary | ICD-10-CM | POA: Diagnosis not present

## 2020-01-16 MED ORDER — LIDOCAINE HCL 1 % IJ SOLN
3.0000 mL | INTRAMUSCULAR | Status: AC | PRN
  Administered 2020-01-16: 3 mL

## 2020-01-16 MED ORDER — METHYLPREDNISOLONE ACETATE 40 MG/ML IJ SUSP
40.0000 mg | INTRAMUSCULAR | Status: AC | PRN
  Administered 2020-01-16: 14:00:00 40 mg via INTRA_ARTICULAR

## 2020-01-16 NOTE — Telephone Encounter (Signed)
Scheduled appts per 5/4 los. Pt confirmed appt date and time.

## 2020-01-16 NOTE — Telephone Encounter (Signed)
Noted  

## 2020-01-16 NOTE — Telephone Encounter (Signed)
Right knee gel injection  

## 2020-01-16 NOTE — Telephone Encounter (Signed)
Please see message. °

## 2020-01-16 NOTE — Progress Notes (Signed)
Office Visit Note   Patient: Linda Foster           Date of Birth: 1955-11-06           MRN: [id number] Visit Date: 01/16/2020              Requested by: Carol Ada, [address],  [place] [address] PCP: Carol Ada, MD   Assessment & Plan: Visit Diagnoses:  1. Chronic pain of left knee   2. Chronic pain of right knee   3. Pain in right foot   4. Unilateral primary osteoarthritis, left knee   5. Unilateral primary osteoarthritis, right knee     Plan: I did recommend a steroid injection in her right knee today to treat the acute pain of her knee.  She would benefit also from hyaluronic acid for long-term pain control the osteoarthritis of her knee.  She agrees with this as well.  She did tolerate the steroid injection about her right knee today.  She is going work on stretching exercises for the Achilles tendon and I gave her a Thera-Band for this.  She needs to work on weight loss and quad training exercises as well.  I did give her handout for hyaluronic acid.  We will see her back in 4 weeks to place a hyaluronic acid injection into her right knee.  All questions and concerns were answered and addressed.  Follow-Up Instructions: Return in about 4 weeks (around 02/13/2020).   Orders:  Orders Placed This Encounter  Procedures  . Large Joint Inj  . XR Knee 1-2 Views Left  . XR Knee 1-2 Views Right  . XR Foot Complete Right   No orders of the defined types were placed in this encounter.     Procedures: Large Joint Inj: R knee on 01/16/2020 2:12 PM Indications: diagnostic evaluation and pain Details: 22 G 1.5 in needle, superolateral approach  Arthrogram: No  Medications: 3 mL lidocaine 1 %; 40 mg methylPREDNISolone acetate 40 MG/ML Outcome: tolerated well, no immediate complications Procedure, treatment alternatives, risks and benefits explained, specific risks discussed. Consent was given by the patient. Immediately prior to procedure a time  out was called to verify the correct patient, procedure, equipment, support staff and site/side marked as required. Patient was prepped and draped in the usual sterile fashion.       Clinical Data: No additional findings.   Subjective: Chief Complaint  Patient presents with  . Right Foot - Pain  The patient comes in today with chief chief complaints.  1 is right foot pain and to his bilateral knee pain.  Her right knee hurts much worse than her left knee.  The left knee really barely hurts.  She does have a history of a left total hip arthroplasty that we did last year.  She has had a remote history of a right knee arthroscopy.  Her right knee hurts on a daily basis.  It is getting certainly worse.  She has had pain over Achilles tendon on the right side and the bottom and top of her right foot.  Some of this was plantar fasciitis before but she also has midfoot pain.  She tries to walk and comfortable shoes.  This has been slowly getting worse for her in terms of her right knee and her right foot and ankle.  The left hip replacement is doing well.  She does have a some left knee pain but is minimal.  HPI  Review of Systems She currently denies any headache, chest pain, shortness of breath, fever, chills, nausea, vomiting  Objective: Vital Signs: There were no vitals taken for this visit.  Physical Exam She is alert and orient x3 and in no acute distress Ortho Exam Examination of her right knee does show well-healed incisions from previous arthroscopy.  She has slight varus malalignment of the knee.  There is a mild effusion.  She lacks full extension by about 3 degrees and has pain throughout the arc of motion of the knee.  The knee is ligamentously stable but has significant medial joint line tenderness as well.  Examination of her right ankle shows her Achilles intact with a negative Thompson test.  There is pain over the Achilles tendon itself.  There is a little bit of plantar fascial  pain and over the heel and some midfoot pain.  Her foot is well-perfused with normal sensation. Specialty Comments:  No specialty comments available.  Imaging: XR Knee 1-2 Views Left  Result Date: 01/16/2020 2 views of the left knee show moderate arthritic changes with osteophytes and joint space narrowing.  XR Foot Complete Right  Result Date: 01/16/2020 3 views of the right foot show no acute findings.  The midfoot joint space is well-maintained.  The arch appears normal.  There is slight arthritic changes at the first MTP joint.  There is a small spur at the insertion of the Achilles at the calcaneus.  XR Knee 1-2 Views Right  Result Date: 01/16/2020 2 views of the right knee show significant tricompartment arthritic changes with a varus malalignment.  There is significant medial joint space narrowing.  There is osteophytes in all 3 compartments.    PMFS History: Patient Active Problem List   Diagnosis Date Noted  . Unilateral primary osteoarthritis, left hip 06/15/2019  . Status post total replacement of left hip 06/15/2019  . OSA (obstructive sleep apnea) 02/13/2018  . 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 Family history of prostate cancer 01/02/2018  . Breast cancer, left breast ([place]) 01/02/2018  . Genetic testing 12/15/2017  . Primary malignant neoplasm of lower outer quadrant of female breast, left ([place]) 12/14/2017  . History of therapeutic radiation 11/01/2017  . 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 Chest pain with moderate risk of acute coronary syndrome 11/03/2016  . Atrial fibrillation with rapid ventricular response ([place]) 11/02/2016  . History of invasive breast cancer 11/06/2012   Past Medical History:  Diagnosis Date  . Breast cancer ([place]) 2008   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  . Breast cancer ([place]) 2019   left ER/PR +  . Family history of adverse reaction to anesthesia    N&V  . Family history of  prostate cancer   . Lymphedema 2013  . OSA (obstructive sleep apnea)    uses a dental appliance  . Osteoarthritis   . Paroxysmal atrial fibrillation (HCC)    History of Afib  . PONV (postoperative nausea and vomiting)     Family History  Problem Relation Age of Onset  . Hypertension Mother   . COPD Father   . Prostate cancer Father 10  . Lung cancer Brother 47  . Prostate cancer Brother 36  . Cancer Cousin        type unk dx. >50  . Cancer Cousin 58       type unk  . Cancer Cousin        type and age  dx unkown    Past Surgical History:  Procedure Laterality Date  . ABDOMINAL HYSTERECTOMY    . BREAST LUMPECTOMY Left 2008  . BREAST LUMPECTOMY WITH RADIOACTIVE SEED LOCALIZATION Left 12/05/2017   Procedure: BREAST LUMPECTOMY WITH RADIOACTIVE SEED LOCALIZATION;  Surgeon: Rolm Bookbinder, MD;  Location: [place];  Service: General;  Laterality: Left;  . BREAST RECONSTRUCTION Left 2019   no BP on Lt arm  . CESAREAN SECTION      x 3  . KNEE ARTHROSCOPY Right 2007  . TOTAL HIP ARTHROPLASTY Left 06/15/2019   Procedure: LEFT TOTAL HIP ARTHROPLASTY ANTERIOR APPROACH;  Surgeon: Mcarthur Rossetti, MD;  [facility];  Service: Orthopedics;  Laterality: Left;   Social History   Occupational History  . Not on file  Tobacco Use  . Smoking status: Never Smoker  . Smokeless tobacco: Never Used  Substance and Sexual Activity  . Alcohol use: No  . Drug use: No  . Sexual activity: Not on file

## 2020-01-17 ENCOUNTER — Telehealth: Payer: Self-pay

## 2020-01-17 NOTE — Telephone Encounter (Signed)
Gwynn with Dr. Adah Salvage office would like Pre-Dental Antibiotic letter faxed to there office at [phone].  Cb# [phone].  Please advise.  Thank you.

## 2020-01-18 ENCOUNTER — Telehealth: Payer: Self-pay

## 2020-01-18 NOTE — Telephone Encounter (Signed)
Submitted VOB for SynviscOne, right knee. 

## 2020-01-23 ENCOUNTER — Telehealth: Payer: Self-pay

## 2020-01-23 NOTE — Telephone Encounter (Signed)
Talked with Vaughan Basta C.at [employer] to initiate PA for SynviscOne, right knee.  Per Vaughan Basta C.,PA forms will be faxed to our office. Call reference#LindaC.01/23/2020

## 2020-01-25 ENCOUNTER — Telehealth: Payer: Self-pay

## 2020-01-25 NOTE — Telephone Encounter (Signed)
Received PA forms for SynviscOne, right knee. Faxed completed PA forms to [facility] at [phone].

## 2020-01-29 ENCOUNTER — Telehealth: Payer: Self-pay

## 2020-01-29 NOTE — Telephone Encounter (Signed)
Approved, SynviscOne, right knee. [place] Meet deductible first Patient will be responsible for 50% OOP No Co-pay PA required PA Approval# [id number] Valid 01/25/2020- 07/23/2020  Appt. 02/13/2020

## 2020-02-13 ENCOUNTER — Other Ambulatory Visit: Payer: Self-pay

## 2020-02-13 ENCOUNTER — Ambulatory Visit (INDEPENDENT_AMBULATORY_CARE_PROVIDER_SITE_OTHER): Payer: BC Managed Care – PPO | Admitting: Orthopaedic Surgery

## 2020-02-13 ENCOUNTER — Encounter: Payer: Self-pay | Admitting: Orthopaedic Surgery

## 2020-02-13 DIAGNOSIS — M1711 Unilateral primary osteoarthritis, right knee: Secondary | ICD-10-CM

## 2020-02-13 NOTE — Progress Notes (Signed)
HPI: Linda Foster comes in today for Synvisc 1 injection right knee.  She states the cortisone injection really helped with her knee on 01/16/2020.  She question whether she needs a Synvisc 1 injection as she is having no knee pain.  We discussed this and decided not to move ahead with a Synvisc 1 injection for now.  Impression: Right knee osteoarthritis  Plan: For now we will hold on the Synvisc 1 injection right knee.  Did discuss with her if her symptoms do return in a short period of time less than 3 months would recommend a Synvisc 1 injection otherwise she can have cortisone injections every 3 to 4 months as needed.  Questions were encouraged and answered at length.  No charge for today's office visit

## 2020-02-20 ENCOUNTER — Ambulatory Visit: Payer: BC Managed Care – PPO | Admitting: Orthopaedic Surgery

## 2020-02-21 DIAGNOSIS — R7303 Prediabetes: Secondary | ICD-10-CM | POA: Diagnosis not present

## 2020-02-21 DIAGNOSIS — R600 Localized edema: Secondary | ICD-10-CM | POA: Diagnosis not present

## 2020-02-21 DIAGNOSIS — Z0000 Encounter for general adult medical examination without abnormal findings: Secondary | ICD-10-CM | POA: Diagnosis not present

## 2020-02-21 DIAGNOSIS — E785 Hyperlipidemia, unspecified: Secondary | ICD-10-CM | POA: Diagnosis not present

## 2020-02-21 DIAGNOSIS — C50919 Malignant neoplasm of unspecified site of unspecified female breast: Secondary | ICD-10-CM | POA: Diagnosis not present

## 2020-02-21 DIAGNOSIS — Z23 Encounter for immunization: Secondary | ICD-10-CM | POA: Diagnosis not present

## 2020-05-28 ENCOUNTER — Telehealth: Payer: Self-pay | Admitting: Internal Medicine

## 2020-05-28 NOTE — Telephone Encounter (Signed)
Left voicemail on cell phone to call office back.   Home phone number is not valid.

## 2020-05-28 NOTE — Telephone Encounter (Signed)
Spoke to the patient and she states her heart rate is all over the place: 05/27/2020-  8:30 pm heart rate 138 ~10 pm heart rate in the 150's  Took pm dose of metoprolol at 8:30 pm and did not sleep well last night.  05/28/2020  AM heart rate in the 40's, 152, 50's "going up and down all morning.  She felt like she had a little indigestion or pulled muscle in left of chest.  She feels winded or short of breath but oxygen levels have been between 98% and 100% every time she checked it. She had one dizzy episode while turning here head.   Will forward to Roderic Palau with the Afib clinic for advisement along with RN Stacy.

## 2020-05-28 NOTE — Telephone Encounter (Signed)
Spoke with the patient and advised recommendation from the Afib clinic. IF heart is over 100 tonight when she takes her metoprolol and her BP is over 110 SBP to take an extra half a tablet. IF her heart rate is up and down still tomorrow morning to follow up with the atrial fibrillation clinic at [phone] and Butch Penny will get her in for a visit.

## 2020-05-28 NOTE — Telephone Encounter (Signed)
Patient is following up. Please call

## 2020-05-28 NOTE — Telephone Encounter (Signed)
Follow up   Pt returning call, call trasnferred to [facility]

## 2020-05-28 NOTE — Telephone Encounter (Signed)
Patient c/o Palpitations:  High priority if patient c/o lightheadedness, shortness of breath, or chest pain  1) How long have you had palpitations/irregular HR/ Afib? Are you having the symptoms now? No  2) Are you currently experiencing lightheadedness, SOB or CP? SOB  3) Do you have a history of afib (atrial fibrillation) or irregular heart rhythm? Yes   4) Have you checked your BP or HR? (document readings if available): 40's and 50's  5) Are you experiencing any other symptoms? Hard to take in a deep breaths

## 2020-05-29 ENCOUNTER — Ambulatory Visit (HOSPITAL_COMMUNITY)
Admission: RE | Admit: 2020-05-29 | Discharge: 2020-05-29 | Disposition: A | Discharge status: Home / Self Care | Payer: BC Managed Care – PPO | Source: Ambulatory Visit | Attending: Nurse Practitioner | Admitting: Nurse Practitioner

## 2020-05-29 ENCOUNTER — Other Ambulatory Visit: Payer: Self-pay

## 2020-05-29 ENCOUNTER — Encounter (HOSPITAL_COMMUNITY): Payer: Self-pay | Admitting: Nurse Practitioner

## 2020-05-29 VITALS — BP 136/84 | HR 71 | Ht 66.0 in | Wt 215.6 lb

## 2020-05-29 DIAGNOSIS — Z79899 Other long term (current) drug therapy: Secondary | ICD-10-CM | POA: Insufficient documentation

## 2020-05-29 DIAGNOSIS — Z96642 Presence of left artificial hip joint: Secondary | ICD-10-CM | POA: Diagnosis not present

## 2020-05-29 DIAGNOSIS — Z9989 Dependence on other enabling machines and devices: Secondary | ICD-10-CM | POA: Diagnosis not present

## 2020-05-29 DIAGNOSIS — G4733 Obstructive sleep apnea (adult) (pediatric): Secondary | ICD-10-CM | POA: Insufficient documentation

## 2020-05-29 DIAGNOSIS — M1990 Unspecified osteoarthritis, unspecified site: Secondary | ICD-10-CM | POA: Diagnosis not present

## 2020-05-29 DIAGNOSIS — I480 Paroxysmal atrial fibrillation: Secondary | ICD-10-CM

## 2020-05-29 DIAGNOSIS — Z9079 Acquired absence of other genital organ(s): Secondary | ICD-10-CM | POA: Insufficient documentation

## 2020-05-29 MED ORDER — METOPROLOL TARTRATE 25 MG PO TABS: 25.0000 mg | ORAL_TABLET | Freq: Two times a day (BID) | ORAL | Status: DC

## 2020-05-29 NOTE — Progress Notes (Signed)
Primary Care Physician: Carol Ada, MD Referring Physician:Dr. Allred( previous Dr. Caryl Comes)   Linda Foster is a 64 y.o. female with a h/o paroxysmal afib that was seen  in the afib clinic early fall  for afib being found at time of preop for hip surgery .  BB was restarted and she was in SR today when seen in the clinic.  She went on to have her hip surgery and did well without any issues with afib.  She has felt a few flutters since  then but short lived. Not on anticoagulation with CHA2DS2VASc score of 1 for female.  F/u in afib clinic, 05/28/20. Pt is here as she had irregular heart beat that started Tuesday pm after returning from  the beach. She considered taking extra metoprolol but did not. She is not on anticoagulation as she has a CHA2DS2VASc score of 1. She feels better today and EKG shows SR. No alcohol, heavy caffeine use, no tobacco use.   Today, she denies symptoms of palpitations, chest pain, shortness of breath, orthopnea, PND, lower extremity edema, dizziness, presyncope, syncope, or neurologic sequela. The patient is tolerating medications without difficulties and is otherwise without complaint today.   Past Medical History:  Diagnosis Date  . 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 Breast cancer ([place]) 2019   left ER/PR +  . Family history of adverse reaction to anesthesia    N&V  . Family history of prostate cancer   . Lymphedema 2013  . OSA (obstructive sleep apnea)    uses a dental appliance  . Osteoarthritis   . Paroxysmal atrial fibrillation ([facility])    History of Afib  . PONV (postoperative nausea and vomiting)    Past Surgical History:  Procedure Laterality Date  . ABDOMINAL HYSTERECTOMY    . BREAST LUMPECTOMY Left 2008  . BREAST LUMPECTOMY WITH RADIOACTIVE SEED LOCALIZATION Left 12/05/2017   Procedure: BREAST LUMPECTOMY WITH RADIOACTIVE SEED LOCALIZATION;  Surgeon: Rolm Bookbinder, MD;  Location: [place];  Service:  General;  Laterality: Left;  . BREAST RECONSTRUCTION Left 2019   no BP on Lt arm  . CESAREAN SECTION      x 3  . KNEE ARTHROSCOPY Right 2007  . TOTAL HIP ARTHROPLASTY Left 06/15/2019   Procedure: LEFT TOTAL HIP ARTHROPLASTY ANTERIOR APPROACH;  Surgeon: Mcarthur Rossetti, MD;  [facility];  Service: Orthopedics;  Laterality: Left;    Current Outpatient Medications  Medication Sig Dispense Refill  . anastrozole (ARIMIDEX) 1 MG tablet Take 1 tablet (1 mg total) by mouth daily. 90 tablet 4  . b complex vitamins capsule Take 1 capsule by mouth daily.    . Cholecalciferol (VITAMIN D3) 2000 units TABS Take 2,000 Units by mouth daily.    . furosemide (LASIX) 40 MG tablet Take 1 tablet (40 mg total) by mouth daily. 90 tablet 3  . glucosamine-chondroitin 500-400 MG tablet Take 2 tablets by mouth daily.    Marland Kitchen ibuprofen (ADVIL,MOTRIN) 200 MG tablet Take 400 mg by mouth as needed for moderate pain.     Marland Kitchen MULTIPLE VITAMIN PO Take 2 tablets by mouth daily.    . naproxen (NAPROSYN) 500 MG tablet Take 500 mg by mouth 2 (two) times daily.    . metoprolol tartrate (LOPRESSOR) 25 MG tablet Take 1 tablet (25 mg total) by mouth 2 (two) times daily.     No current facility-administered medications for this encounter.    No Known Allergies  Social History   Socioeconomic History  . Marital status: Married    Spouse name: Not on file  . Number of children: Not on file  . Years of education: Not on file  . Highest education level: Not on file  Occupational History  . Not on file  Tobacco Use  . Smoking status: Never Smoker  . Smokeless tobacco: Never Used  Vaping Use  . Vaping Use: Never used  Substance and Sexual Activity  . Alcohol use: No  . Drug use: No  . Sexual activity: Not on file  Other Topics Concern  . Not on file  Social History Narrative  . Not on file   Social Determinants of Health   Financial Resource Strain:   . Difficulty of Paying Living Expenses: Not on file   Food Insecurity:   . Worried About Charity fundraiser in the Last Year: Not on file  . Ran Out of Food in the Last Year: Not on file  Transportation Needs:   . Lack of Transportation (Medical): Not on file  . Lack of Transportation (Non-Medical): Not on file  Physical Activity:   . Days of Exercise per Week: Not on file  . Minutes of Exercise per Session: Not on file  Stress:   . Feeling of Stress : Not on file  Social Connections:   . Frequency of Communication with Friends and Family: Not on file  . Frequency of Social Gatherings with Friends and Family: Not on file  . Attends Religious Services: Not on file  . Active Member of Clubs or Organizations: Not on file  . Attends Archivist Meetings: Not on file  . Marital Status: Not on file  Intimate Partner Violence:   . Fear of Current or Ex-Partner: Not on file  . Emotionally Abused: Not on file  . Physically Abused: Not on file  . Sexually Abused: Not on file    Family History  Problem Relation Age of Onset  . Hypertension Mother   . COPD Father   . Prostate cancer Father 36  . Lung cancer Brother 20  . Prostate cancer Brother 25  . Cancer Cousin        type unk dx. >50  . Cancer Cousin 58       type unk  . Cancer Cousin        type and age dx unkown    ROS- All systems are reviewed and negative except as per the HPI above  Physical Exam: Vitals:   05/29/20 1505  BP: 136/84  Pulse: 71  Weight: 97.8 kg  Height: 5\' 6"  (1.676 m)   Wt Readings from Last 3 Encounters:  05/29/20 97.8 kg  01/15/20 97 kg  08/28/19 97.2 kg    Labs: Lab Results  Component Value Date   NA 142 01/15/2020   K 3.8 01/15/2020   CL 107 01/15/2020   CO2 28 01/15/2020   GLUCOSE 88 01/15/2020   BUN 22 01/15/2020   CREATININE 0.84 01/15/2020   CALCIUM 9.5 01/15/2020   MG 2.2 11/02/2016   Lab Results  Component Value Date   INR 1.02 11/03/2016   Lab Results  Component Value Date   CHOL 232 (H) 11/03/2016   HDL 74  11/03/2016   LDLCALC 145 (H) 11/03/2016   TRIG 63 11/03/2016     GEN- The patient is well appearing, alert and oriented x 3 today.   Head- normocephalic, atraumatic Eyes-  Sclera clear, conjunctiva pink Ears- hearing  intact Oropharynx- clear Neck- supple, no JVP Lymph- no cervical lymphadenopathy Lungs- Clear to ausculation bilaterally, normal work of breathing Heart- Regular rate and rhythm, no murmurs, rubs or gallops, PMI not laterally displaced GI- soft, NT, ND, + BS Extremities- no clubbing, cyanosis, or edema MS- no significant deformity or atrophy Skin- no rash or lesion Psych- euthymic mood, full affect Neuro- strength and sensation are intact  EKG-NSR at 71 bpm, PR int 140 ms, qrs int 84  ms, qtc 447 ms Epic records reviewed    Assessment and Plan: 1. Paroxysmal afib Afib x 36 hours, self converted Overall afib burden is low  Discussed  triggers and how to use prn metoprolol for episodes  Continue metoprolol  25 mg bid   2.Chadsvasc score of 1 Does not require anticoagulation  at this time  F/u here as needed  Butch Penny C. Jakya Dovidio, [facility] [address] [place], [place] [address] [phone]

## 2020-06-04 ENCOUNTER — Other Ambulatory Visit: Payer: Self-pay | Admitting: Internal Medicine

## 2020-06-06 ENCOUNTER — Telehealth: Payer: Self-pay | Admitting: *Deleted

## 2020-06-06 DIAGNOSIS — I480 Paroxysmal atrial fibrillation: Secondary | ICD-10-CM

## 2020-06-06 NOTE — Telephone Encounter (Signed)
-----   Message from Juluis Mire, RN sent at 05/29/2020  4:09 PM EDT ----- Regarding: ct calcium score Pt would like ct calcium score performed since pt doesn't have general cardiologist donna said it should come from Dr. Rayann Heman. Hopefully you can help get this ordered/scheduled for her. She said she knows she will have to pay out of pocket for it? Marzetta Board

## 2020-06-06 NOTE — Telephone Encounter (Signed)
Per MD Allred okay to order testing requested.

## 2020-06-16 DIAGNOSIS — Z1231 Encounter for screening mammogram for malignant neoplasm of breast: Secondary | ICD-10-CM | POA: Diagnosis not present

## 2020-06-17 ENCOUNTER — Inpatient Hospital Stay: Payer: BC Managed Care – PPO | Attending: Oncology | Admitting: Oncology

## 2020-06-17 ENCOUNTER — Other Ambulatory Visit: Payer: Self-pay

## 2020-06-17 ENCOUNTER — Inpatient Hospital Stay: Payer: BC Managed Care – PPO

## 2020-06-17 VITALS — BP 131/75 | HR 69 | Temp 97.0°F | Resp 18 | Ht 66.0 in | Wt 218.2 lb

## 2020-06-17 DIAGNOSIS — C50512 Malignant neoplasm of lower-outer quadrant of left female breast: Secondary | ICD-10-CM | POA: Diagnosis not present

## 2020-06-17 DIAGNOSIS — M170 Bilateral primary osteoarthritis of knee: Secondary | ICD-10-CM | POA: Diagnosis not present

## 2020-06-17 DIAGNOSIS — C50912 Malignant neoplasm of unspecified site of left female breast: Secondary | ICD-10-CM | POA: Insufficient documentation

## 2020-06-17 DIAGNOSIS — D0512 Intraductal carcinoma in situ of left breast: Secondary | ICD-10-CM

## 2020-06-17 DIAGNOSIS — I480 Paroxysmal atrial fibrillation: Secondary | ICD-10-CM | POA: Diagnosis not present

## 2020-06-17 DIAGNOSIS — Z9012 Acquired absence of left breast and nipple: Secondary | ICD-10-CM | POA: Diagnosis not present

## 2020-06-17 DIAGNOSIS — Z791 Long term (current) use of non-steroidal anti-inflammatories (NSAID): Secondary | ICD-10-CM | POA: Insufficient documentation

## 2020-06-17 DIAGNOSIS — Z79811 Long term (current) use of aromatase inhibitors: Secondary | ICD-10-CM | POA: Insufficient documentation

## 2020-06-17 DIAGNOSIS — Z923 Personal history of irradiation: Secondary | ICD-10-CM | POA: Diagnosis not present

## 2020-06-17 DIAGNOSIS — G4733 Obstructive sleep apnea (adult) (pediatric): Secondary | ICD-10-CM | POA: Diagnosis not present

## 2020-06-17 DIAGNOSIS — M19012 Primary osteoarthritis, left shoulder: Secondary | ICD-10-CM | POA: Insufficient documentation

## 2020-06-17 DIAGNOSIS — Z170 Estrogen receptor positive status [ER+]: Secondary | ICD-10-CM | POA: Insufficient documentation

## 2020-06-17 DIAGNOSIS — Z9221 Personal history of antineoplastic chemotherapy: Secondary | ICD-10-CM | POA: Insufficient documentation

## 2020-06-17 DIAGNOSIS — Z853 Personal history of malignant neoplasm of breast: Secondary | ICD-10-CM | POA: Diagnosis not present

## 2020-06-17 DIAGNOSIS — M19011 Primary osteoarthritis, right shoulder: Secondary | ICD-10-CM | POA: Diagnosis not present

## 2020-06-17 DIAGNOSIS — Z79899 Other long term (current) drug therapy: Secondary | ICD-10-CM | POA: Diagnosis not present

## 2020-06-17 LAB — CBC WITH DIFFERENTIAL/PLATELET
Abs Immature Granulocytes: 0.02 10*3/uL (ref 0.00–0.07)
Basophils Absolute: 0 10*3/uL (ref 0.0–0.1)
Basophils Relative: 1 %
Eosinophils Absolute: 0.1 10*3/uL (ref 0.0–0.5)
Eosinophils Relative: 2 %
HCT: 39.5 % (ref 36.0–46.0)
Hemoglobin: 13 g/dL (ref 12.0–15.0)
Immature Granulocytes: 0 %
Lymphocytes Relative: 46 %
Lymphs Abs: 2.8 10*3/uL (ref 0.7–4.0)
MCH: 29.4 pg (ref 26.0–34.0)
MCHC: 32.9 g/dL (ref 30.0–36.0)
MCV: 89.4 fL (ref 80.0–100.0)
Monocytes Absolute: 0.5 10*3/uL (ref 0.1–1.0)
Monocytes Relative: 7 %
Neutro Abs: 2.6 10*3/uL (ref 1.7–7.7)
Neutrophils Relative %: 44 %
Platelets: 175 10*3/uL (ref 150–400)
RBC: 4.42 MIL/uL (ref 3.87–5.11)
RDW: 13.2 % (ref 11.5–15.5)
WBC: 6.1 10*3/uL (ref 4.0–10.5)
nRBC: 0 % (ref 0.0–0.2)

## 2020-06-17 LAB — COMPREHENSIVE METABOLIC PANEL
ALT: 18 U/L (ref 0–44)
AST: 19 U/L (ref 15–41)
Albumin: 4 g/dL (ref 3.5–5.0)
Alkaline Phosphatase: 96 U/L (ref 38–126)
Anion gap: 7 (ref 5–15)
BUN: 16 mg/dL (ref 8–23)
CO2: 32 mmol/L (ref 22–32)
Calcium: 9.7 mg/dL (ref 8.9–10.3)
Chloride: 104 mmol/L (ref 98–111)
Creatinine, Ser: 0.82 mg/dL (ref 0.44–1.00)
GFR calc non Af Amer: >60 mL/min (ref >60)
Glucose, Bld: 88 mg/dL (ref 70–99)
Potassium: 3.9 mmol/L (ref 3.5–5.1)
Sodium: 143 mmol/L (ref 135–145)
Total Bilirubin: 0.8 mg/dL (ref 0.3–1.2)
Total Protein: 7.1 g/dL (ref 6.5–8.1)

## 2020-06-17 MED ORDER — ANASTROZOLE 1 MG PO TABS
1.0000 mg | ORAL_TABLET | Freq: Every day | ORAL | 4 refills | Status: DC
Start: 2020-06-17 — End: 2021-11-24

## 2020-06-17 NOTE — Progress Notes (Signed)
[place]  [phone] [phone]     ID: Linda Foster DOB: [birth year]  MR#: [id number]  GHW#:[id number]  Patient Care Team: Carol Ada, MD as PCP - General (Family Medicine) Hadasah Compton, MD as Consulting Physician (Obstetrics and Gynecology) Marlou Sa Tonna Corner, MD as Consulting Physician (Orthopedic Surgery) Rolm Bookbinder, MD as Consulting Physician (General Surgery) Maily Debarge, Virgie Dad, MD as Consulting Physician (Oncology) Irene Limbo, MD as Consulting Physician (Plastic Surgery) Jacinto Reap, MD (Plastic Surgery) Belva Crome, MD as Consulting Physician (Cardiology) Belva Crome, MD as Consulting Physician (Cardiology) Mcarthur Rossetti, MD as Consulting Physician (Orthopedic Surgery) OTHER MD:   CHIEF COMPLAINT: 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    She continues on anastrozole, with good tolerance. She does not have significant problems with hot flashes or vaginal dryness. She does have a little bit of arthritis particularly involving the knees and shoulders.  Her most recent bone density screening from 10/16/2018 at [facility] showed a T-score of -1.4.  Since her last visit, she underwent bilateral diagnostic mammography with tomography at [facility] on 06/16/2020 showing: breast density category C; no evidence of malignancy in either breast.    REVIEW OF SYSTEMS: Linda Foster has had the Moderna vaccine x2 without complications. Her daughter is a Theme park manager and her husband both had Covid. They're recovering well. Paul exercises by walking. She does not do this regularly though she says. A detailed review of systems today was otherwise stable.  HISTORY OF CURRENT ILLNESS: From the original intake note:  Linda Foster had routine screening mammography  on 10/03/2017 showing a possible abnormality in the left breast. She underwent unilateral left breast diagnostic mammography with tomography at [facility] on 10/13/2017 showing: breast density category C. There are new grouped calcifications in the left breast lower outer quadrant and middle depth. Ultrasound of the left axilla on 10/20/2017 showed no abnormalities.   Accordingly on 10/20/2017 she proceeded to biopsy of the left breast area in question. The pathology from this procedure showed ([id number]): Ductal carcinoma in situ, intermediate grade. The largest focus measures 4 mm. The carcinoma is cribriform type with necrosis and associated microcalcifications. Prognostic indicators significant for: estrogen receptor, 95% positive and progesterone receptor, 80% positive, both with strong staining intensity.  Accordingly on 12/05/2017 she proceeded to left lumpectomy.  The pathology [id number]) showed invasive ductal carcinoma, grade 1, measuring 0.3 cm, in addition to low-grade ductal carcinoma in situ.  Both the invasive and in situ components were focally less than a millimeter from the inferior margin.  Note that Linda Foster has a history of prior breast cancer, status post left lumpectomy for what seems to have been a T2 N3 invasive ductal carcinoma, estrogen and progesterone receptor negative.  She was treated with chemotherapy followed by radiation.  Accordingly the patient is not a candidate for repeat radiation and the feasibility or value of sentinel lymph node sampling is questionable.  Aviona has been advised by Dr. Donne Hazel that mastectomy is standard in this situation and she has met with Dr. Iran Planas to consider reconstruction options.  However the patient is interested in exploring the possibility of lumpectomy.  Her subsequent history is as detailed below.   PAST MEDICAL HISTORY: Past Medical History:  Diagnosis Date  . 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 Breast  cancer ([place]) 2019  left ER/PR +  . Family history of adverse reaction to anesthesia    N&V  . Family history of prostate cancer   . Lymphedema 2013  . OSA (obstructive sleep apnea)    uses a dental appliance  . Osteoarthritis   . Paroxysmal atrial fibrillation ([facility])    History of Afib  . PONV (postoperative nausea and vomiting)     PAST SURGICAL HISTORY: Past Surgical History:  Procedure Laterality Date  . ABDOMINAL HYSTERECTOMY    . BREAST LUMPECTOMY Left 2008  . BREAST LUMPECTOMY WITH RADIOACTIVE SEED LOCALIZATION Left 12/05/2017   Procedure: BREAST LUMPECTOMY WITH RADIOACTIVE SEED LOCALIZATION;  Surgeon: Rolm Bookbinder, MD;  Location: [facility];  Service: General;  Laterality: Left;  . BREAST RECONSTRUCTION Left 2019   no BP on Lt arm  . CESAREAN SECTION      x 3  . KNEE ARTHROSCOPY Right 2007  . TOTAL HIP ARTHROPLASTY Left 06/15/2019   Procedure: LEFT TOTAL HIP ARTHROPLASTY ANTERIOR APPROACH;  Surgeon: Mcarthur Rossetti, MD;  Location: [facility];  Service: Orthopedics;  Laterality: Left;  She had an episode of  A Fib in 2018. She was being seen by Dr. Oran Rein about 10 years ago.    FAMILY HISTORY Family History  Problem Relation Age of Onset  . Hypertension Mother   . COPD Father   . Prostate cancer Father 1  . Lung cancer Brother 50  . Prostate cancer Brother 58  . Cancer Cousin        type unk dx. >50  . Cancer Cousin 58       type unk  . Cancer Cousin        type and age dx unkown  Very detailed family history is available in the genetics counseling note from 11/14/2017.  The patient has been genetically tested and no pathogenic mutation was identified (see below).   GYNECOLOGIC HISTORY:  Menarche: 64 years old Age at first live birth: 64 years old The patient is GXP3. She is s/p hysterectomy and USO in 2007. She did not receive HRT. She took oral contraceptives for more than a year remotely with no complications.   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 Linda Foster, her husband of 11 years, owns [place]. The patient's daughter, Linda Foster, is an Psychologist, prison and probation services at [employer]. The patient's daughter, Linda Foster, is a Probation officer with her own business. The patient's son, Linda Foster, works with Linda Foster at [employer], but he also has a degree in Therapist, sports. The patient has 7 grandchildren. She attends [employer].    ADVANCED DIRECTIVES: In the absence of any documents to the contrary the patient's husband is her healthcare power of attorney   HEALTH MAINTENANCE: Social History   Tobacco Use  . Smoking status: Never Smoker  . Smokeless tobacco: Never Used  Vaping Use  . Vaping Use: Never used  Substance Use Topics  . Alcohol use: No  . Drug use: No     Colonoscopy: 10 years ago/ [facility]   PAP:  Bone density: January 2012/ normal    No Known Allergies  Current Outpatient Medications  Medication Sig Dispense Refill  . anastrozole (ARIMIDEX) 1 MG tablet Take 1 tablet (1 mg total) by mouth daily. 90 tablet 4  . b complex vitamins capsule Take 1 capsule by mouth daily.    . Cholecalciferol (VITAMIN D3) 2000 units TABS Take 2,000 Units by mouth daily.    Marland Kitchen  furosemide (LASIX) 40 MG tablet Take 1 tablet (40 mg total) by mouth daily. 90 tablet 3  . glucosamine-chondroitin 500-400 MG tablet Take 2 tablets by mouth daily.    Marland Kitchen ibuprofen (ADVIL,MOTRIN) 200 MG tablet Take 400 mg by mouth as needed for moderate pain.     . metoprolol tartrate (LOPRESSOR) 25 MG tablet TAKE 1 TABLET BY MOUTH TWICE A DAY 180 tablet 0  . MULTIPLE VITAMIN PO Take 2 tablets by mouth daily.    . naproxen (NAPROSYN) 500 MG tablet Take 500 mg by mouth 2 (two) times daily.     No current facility-administered medications for this visit.    OBJECTIVE: white woman in no acute distress  Vitals:   06/17/20 1337  BP: 131/75  Pulse: 69  Resp: 18  Temp: (!) 97 F (36.1 C)  SpO2:  100%     Body mass index is 35.22 kg/m.   Wt Readings from Last 3 Encounters:  06/17/20 218 lb 3.2 oz (99 kg)  05/29/20 215 lb 9.6 oz (97.8 kg)  01/15/20 213 lb 12.8 oz (97 kg)      ECOG FS:1 - Symptomatic but completely ambulatory  Sclerae unicteric, EOMs intact Wearing a mask No cervical or supraclavicular adenopathy Lungs no rales or rhonchi Heart regular rate and rhythm Abd soft, nontender, positive bowel sounds MSK no focal spinal tenderness, no upper extremity lymphedema Neuro: nonfocal, well oriented, appropriate affect Breasts: The right breast is unremarkable. The left breast has undergone mastectomy with reconstruction. The cosmetic result is very good. There is no evidence of recurrent or residual disease. Both axillae are benign.   LAB RESULTS:  CMP     Component Value Date/Time   NA 143 06/17/2020 1255   NA 143 02/28/2017 0937   K 3.9 06/17/2020 1255   CL 104 06/17/2020 1255   CO2 32 06/17/2020 1255   GLUCOSE 88 06/17/2020 1255   BUN 16 06/17/2020 1255   BUN 14 02/28/2017 0937   CREATININE 0.82 06/17/2020 1255   CREATININE 0.82 10/26/2017 1218   CALCIUM 9.7 06/17/2020 1255   PROT 7.1 06/17/2020 1255   ALBUMIN 4.0 06/17/2020 1255   AST 19 06/17/2020 1255   AST 18 10/26/2017 1218   ALT 18 06/17/2020 1255   ALT 15 10/26/2017 1218   ALKPHOS 96 06/17/2020 1255   BILITOT 0.8 06/17/2020 1255   BILITOT 0.9 10/26/2017 1218   GFRNONAA >60 06/17/2020 1255   GFRNONAA >60 10/26/2017 1218   GFRAA >60 01/15/2020 1409   GFRAA >60 10/26/2017 1218    No results found for: TOTALPROTELP, ALBUMINELP, A1GS, A2GS, BETS, BETA2SER, GAMS, MSPIKE, SPEI  No results found for: KPAFRELGTCHN, LAMBDASER, KAPLAMBRATIO  Lab Results  Component Value Date   WBC 6.1 06/17/2020   NEUTROABS 2.6 06/17/2020   HGB 13.0 06/17/2020   HCT 39.5 06/17/2020   MCV 89.4 06/17/2020   PLT 175 06/17/2020    Lab Results  Component Value Date   LABCA2 30 09/16/2011    No components  found for: [id number]  No results for input(s): INR in the last 168 hours.  Lab Results  Component Value Date   LABCA2 30 09/16/2011    No results found for: KJI312  No results found for: OFV886  No results found for: LRJ736  No results found for: CA2729  No components found for: HGQUANT  No results found for: CEA1 / No results found for: CEA1   No results found for: AFPTUMOR  No results found for: [place]  No  results found for: HGBA, HGBA2QUANT, HGBFQUANT, HGBSQUAN (Hemoglobinopathy evaluation)   Lab Results  Component Value Date   LDH 162 09/02/2010    No results found for: IRON, TIBC, IRONPCTSAT (Iron and TIBC)  No results found for: FERRITIN  Urinalysis    Component Value Date/Time   LABSPEC 1.020 10/30/2008 1837   PHURINE 7.5 10/30/2008 1837   GLUCOSEU NEGATIVE 10/30/2008 1837   HGBUR LARGE (A) 10/30/2008 1837   BILIRUBINUR NEGATIVE 10/30/2008 [place] 10/30/2008 1837   PROTEINUR 100 (A) 10/30/2008 1837   UROBILINOGEN 0.2 10/30/2008 1837   NITRITE NEGATIVE 10/30/2008 1837   LEUKOCYTESUR (A) 10/30/2008 1837    MODERATE Biochemical Testing Only. Please order routine urinalysis from main lab if confirmatory testing is needed.    STUDIES: No results found.   ELIGIBLE FOR AVAILABLE RESEARCH PROTOCOL: no  ASSESSMENT: 64 y.o. [place] woman  (1) status post left lumpectomy and axillary lymph node dissection in 2008 for a T2N3 invasive ductal carcinoma, triple negative, pathology and treatment details not available at present--being retrieved  (a) status post adjuvant chemotherapy  (b) status post radiation  (2) status post left lumpectomy 12/05/2017 for a pT1a cN0, stage IA invasive ductal carcinoma, estrogen and progesterone receptor positive, HER-2 not amplified  (a) pathology also showed ductal carcinoma in situ, estrogen and progesterone receptor strongly positive  (b) both invasive and noninvasive components are focally  less than 1 mm from the inferior margin  (3) genetics testing 11/14/2017 through the STAT Breast cancer panel offered by [facility] found no deleterious mutations in ATM, BRCA1, BRCA2, CDH1, CHEK2, PALB2, PTEN, STK11 and TP53.   (a) reflex genetic testing through the Common Hereditary Cancer Panel offered by [facility] additionally found no deleterious mutations in APC, ATM, AXIN2, BARD1, BMPR1A, BRCA1, BRCA2, BRIP1, CDH1, CDKN2A (p14ARF), CDKN2A (p16INK4a), CKD4, CHEK2, CTNNA1, DICER1, EPCAM (Deletion/duplication testing only), GREM1 (promoter region deletion/duplication testing only), KIT, MEN1, MLH1, MSH2, MSH3, MSH6, MUTYH, NBN, NF1, NHTL1, PALB2, PDGFRA, PMS2, POLD1, POLE, PTEN, RAD50, RAD51C, RAD51D, SDHB, SDHC, SDHD, SMAD4, SMARCA4. STK11, TP53, TSC1, TSC2, and VHL.  The following genes were evaluated for sequence changes only: SDHA and HOXB13 c.251G>A variant only.  (c) variants of uncertain significance and APC and BR IP 1 were identified  (4) anastrozole started 12/25/2017  (a) bone density 10/16/2018 shows a T-score of - 1.4  (5) status post left simple mastectomy with DIEP reconstruction at [facility] 03/07/2018  (a) pathology showed no residual cancer in the surgical sample   PLAN: Donyell is now 2 and half years out from definitive surgery for her breast cancer with no evidence of disease recurrence.  This is very favorable.  She is tolerating anastrozole well and the plan is to continue that a minimum of 5 years.  She will be due for repeat bone density next year.  She has knee issues. She does see both Zollie Beckers and Merry Proud began and she might benefit from physical therapy for the knee. She also has some shoulder discomfort which is going to be arthritis. We discussed her participating in tai chi.  Otherwise at this point I feel comfortable seeing her on a once a year basis. She knows to call for any other issue that may develop before the next visit.  Total encounter time 25  minutes.  Vandy Tsuchiya, Virgie Dad, MD  06/17/20 1:50 PM Medical Oncology and Hematology [facility] [place], [place] [address] Tel. [phone]    Fax. [phone]   I,  Wilburn Mylar, am acting as scribe for Dr. Sarajane Jews C. Niyanna Asch.  I, Lurline Del MD, have reviewed the above documentation for accuracy and completeness, and I agree with the above.   *Total Encounter Time as defined by the Centers for Medicare and Medicaid Services includes, in addition to the face-to-face time of a patient visit (documented in the note above) non-face-to-face time: obtaining and reviewing outside history, ordering and reviewing medications, tests or procedures, care coordination (communications with other health care professionals or caregivers) and documentation in the medical record.

## 2020-06-19 ENCOUNTER — Telehealth: Payer: Self-pay | Admitting: Oncology

## 2020-06-19 ENCOUNTER — Ambulatory Visit (INDEPENDENT_AMBULATORY_CARE_PROVIDER_SITE_OTHER)
Admission: RE | Admit: 2020-06-19 | Discharge: 2020-06-19 | Disposition: A | Discharge status: Home / Self Care | Payer: Self-pay | Source: Ambulatory Visit | Attending: Internal Medicine | Admitting: Internal Medicine

## 2020-06-19 ENCOUNTER — Other Ambulatory Visit: Payer: Self-pay

## 2020-06-19 DIAGNOSIS — I480 Paroxysmal atrial fibrillation: Secondary | ICD-10-CM

## 2020-06-19 NOTE — Telephone Encounter (Signed)
Scheduled appt per 10/5 LOS - mailed reminder letter with appt date and time

## 2020-06-30 DIAGNOSIS — Z1159 Encounter for screening for other viral diseases: Secondary | ICD-10-CM | POA: Diagnosis not present

## 2020-07-02 DIAGNOSIS — Z1211 Encounter for screening for malignant neoplasm of colon: Secondary | ICD-10-CM | POA: Diagnosis not present

## 2020-07-02 DIAGNOSIS — K5730 Diverticulosis of large intestine without perforation or abscess without bleeding: Secondary | ICD-10-CM | POA: Diagnosis not present

## 2020-08-21 ENCOUNTER — Ambulatory Visit: Payer: Self-pay

## 2020-08-21 ENCOUNTER — Ambulatory Visit: Payer: BC Managed Care – PPO | Admitting: Orthopaedic Surgery

## 2020-08-21 ENCOUNTER — Ambulatory Visit: Payer: BC Managed Care – PPO | Admitting: Family Medicine

## 2020-08-21 ENCOUNTER — Ambulatory Visit (INDEPENDENT_AMBULATORY_CARE_PROVIDER_SITE_OTHER): Payer: BC Managed Care – PPO | Admitting: Orthopaedic Surgery

## 2020-08-21 DIAGNOSIS — M79672 Pain in left foot: Secondary | ICD-10-CM

## 2020-08-21 DIAGNOSIS — H11043 Peripheral pterygium, stationary, bilateral: Secondary | ICD-10-CM | POA: Diagnosis not present

## 2020-08-21 DIAGNOSIS — H1045 Other chronic allergic conjunctivitis: Secondary | ICD-10-CM | POA: Diagnosis not present

## 2020-08-21 DIAGNOSIS — H2513 Age-related nuclear cataract, bilateral: Secondary | ICD-10-CM | POA: Diagnosis not present

## 2020-08-21 DIAGNOSIS — G453 Amaurosis fugax: Secondary | ICD-10-CM | POA: Diagnosis not present

## 2020-08-21 NOTE — Progress Notes (Signed)
The patient comes in today with midfoot pain for last 6 months.  She is worried that she has a stress fracture.  She is going out of town next week and is going to be doing a lot of walking.  She has no known injury.  She points to the midfoot and the top of her foot as the source of her pain and it sensitive to the touch.  Examination of her left foot does show some pain at the midfoot when I stress the bones in that area.  She does have chronic swelling of both ankles.  She has good range of motion both ankles and feet.  There is no redness.  She has good pulses.  She has a normal-appearing arch.  Her Achilles on both sides are slightly tight.  3 views of the left foot shows no evidence of stress fracture at all.  Her arch appears normal.  There is midfoot arthritic changes that can be seen.  I looked at her shoes and her shoes are not firm at all.  They do not offer any support and are very flexible.  I recommended that she get more supportive shoes and inserts and wear those for a longer period of time and this will help with her midfoot arthritic changes.  All question concerns were answered addressed.  Follow-up can be as needed.

## 2020-08-28 ENCOUNTER — Other Ambulatory Visit: Payer: Self-pay | Admitting: Physician Assistant

## 2020-08-28 DIAGNOSIS — G453 Amaurosis fugax: Secondary | ICD-10-CM

## 2020-09-02 ENCOUNTER — Other Ambulatory Visit: Payer: Self-pay | Admitting: Nurse Practitioner

## 2020-09-02 DIAGNOSIS — H539 Unspecified visual disturbance: Secondary | ICD-10-CM | POA: Diagnosis not present

## 2020-09-11 DIAGNOSIS — L820 Inflamed seborrheic keratosis: Secondary | ICD-10-CM | POA: Diagnosis not present

## 2020-09-24 DIAGNOSIS — G453 Amaurosis fugax: Secondary | ICD-10-CM | POA: Diagnosis not present

## 2020-09-24 DIAGNOSIS — I6523 Occlusion and stenosis of bilateral carotid arteries: Secondary | ICD-10-CM | POA: Diagnosis not present

## 2020-09-24 DIAGNOSIS — Z8673 Personal history of transient ischemic attack (TIA), and cerebral infarction without residual deficits: Secondary | ICD-10-CM | POA: Diagnosis not present

## 2020-09-30 ENCOUNTER — Ambulatory Visit
Admission: RE | Admit: 2020-09-30 | Discharge: 2020-09-30 | Disposition: A | Discharge status: Home / Self Care | Payer: BC Managed Care – PPO | Source: Ambulatory Visit | Attending: Physician Assistant | Admitting: Physician Assistant

## 2020-09-30 ENCOUNTER — Other Ambulatory Visit: Payer: Self-pay

## 2020-09-30 DIAGNOSIS — G453 Amaurosis fugax: Secondary | ICD-10-CM | POA: Diagnosis not present

## 2020-09-30 MED ORDER — GADOBENATE DIMEGLUMINE 529 MG/ML IV SOLN
20.0000 mL | Freq: Once | INTRAVENOUS | Status: AC | PRN
  Administered 2020-09-30: 20 mL via INTRAVENOUS

## 2020-10-27 DIAGNOSIS — H6123 Impacted cerumen, bilateral: Secondary | ICD-10-CM | POA: Diagnosis not present

## 2020-11-25 ENCOUNTER — Encounter (HOSPITAL_COMMUNITY): Payer: Self-pay | Admitting: Physician Assistant

## 2020-11-25 ENCOUNTER — Other Ambulatory Visit: Payer: Self-pay

## 2020-11-25 ENCOUNTER — Telehealth: Payer: Self-pay | Admitting: Internal Medicine

## 2020-11-25 ENCOUNTER — Ambulatory Visit (HOSPITAL_COMMUNITY)
Admission: RE | Admit: 2020-11-25 | Discharge: 2020-11-25 | Disposition: A | Discharge status: Home / Self Care | Payer: BC Managed Care – PPO | Source: Ambulatory Visit | Attending: Physician Assistant | Admitting: Physician Assistant

## 2020-11-25 VITALS — BP 128/100 | HR 128 | Ht 66.0 in | Wt 216.0 lb

## 2020-11-25 DIAGNOSIS — I480 Paroxysmal atrial fibrillation: Secondary | ICD-10-CM

## 2020-11-25 DIAGNOSIS — R0602 Shortness of breath: Secondary | ICD-10-CM | POA: Diagnosis not present

## 2020-11-25 DIAGNOSIS — Z79899 Other long term (current) drug therapy: Secondary | ICD-10-CM | POA: Diagnosis not present

## 2020-11-25 DIAGNOSIS — Z7901 Long term (current) use of anticoagulants: Secondary | ICD-10-CM | POA: Diagnosis not present

## 2020-11-25 DIAGNOSIS — Z8249 Family history of ischemic heart disease and other diseases of the circulatory system: Secondary | ICD-10-CM | POA: Diagnosis not present

## 2020-11-25 LAB — BASIC METABOLIC PANEL
Anion gap: 7 (ref 5–15)
BUN: 17 mg/dL (ref 8–23)
CO2: 26 mmol/L (ref 22–32)
Calcium: 9.3 mg/dL (ref 8.9–10.3)
Chloride: 106 mmol/L (ref 98–111)
Creatinine, Ser: 0.81 mg/dL (ref 0.44–1.00)
GFR, Estimated: >60 mL/min (ref >60)
Glucose, Bld: 117 mg/dL — ABNORMAL HIGH (ref 70–99)
Potassium: 3.8 mmol/L (ref 3.5–5.1)
Sodium: 139 mmol/L (ref 135–145)

## 2020-11-25 LAB — CBC
HCT: 40.3 % (ref 36.0–46.0)
Hemoglobin: 13.7 g/dL (ref 12.0–15.0)
MCH: 30.2 pg (ref 26.0–34.0)
MCHC: 34 g/dL (ref 30.0–36.0)
MCV: 88.8 fL (ref 80.0–100.0)
Platelets: 189 10*3/uL (ref 150–400)
RBC: 4.54 MIL/uL (ref 3.87–5.11)
RDW: 14.6 % (ref 11.5–15.5)
WBC: 7.7 10*3/uL (ref 4.0–10.5)
nRBC: 0 % (ref 0.0–0.2)

## 2020-11-25 MED ORDER — METOPROLOL TARTRATE 25 MG PO TABS
50.0000 mg | ORAL_TABLET | Freq: Two times a day (BID) | ORAL | 1 refills | Status: DC
Start: 2020-11-25 — End: 2020-11-27

## 2020-11-25 MED ORDER — APIXABAN 5 MG PO TABS: 5.0000 mg | ORAL_TABLET | Freq: Two times a day (BID) | ORAL | 3 refills | Status: DC

## 2020-11-25 NOTE — Telephone Encounter (Signed)
Spoke with husband and he states pt went into AF this morning per [employer].  Rate on app was 168.  Pt is SOB.  Husband said it's like she can't take a deep breath.  Pt had not taken her Metoprolol yet so I advised husband to have her go ahead and take that.  BP elevated at 134/101.  Spoke with Dr. Quentin Ore, DOD and he said ok to have pt seen in AF clinic.  AF clinic agreeable to see pt today at [place] with husband and made him aware of time and garage code. Advised to have pt lay down and rest and allow that Metoprolol to kick in.  Husband agreeable to plan.

## 2020-11-25 NOTE — Telephone Encounter (Signed)
Patient c/o Palpitations:  High priority if patient c/o lightheadedness, shortness of breath, or chest pain  1) How long have you had palpitations/irregular HR/ Afib? Are you having the symptoms now? Started around 3:30 am, yes  2) Are you currently experiencing lightheadedness, SOB or CP? SOB constant can't take deep breathe started this morning, cough  3) Do you have a history of afib (atrial fibrillation) or irregular heart rhythm? afib  4) Have you checked your BP or HR? (document readings if available): states BP was fine, 168 HR  5) Are you experiencing any other symptoms? No   Patient's husband states they think the patient is back in afib. He states her symptoms started around 3:30 am and she is also having SOB. He states she has a cough as well. He is currently with the patient.

## 2020-11-25 NOTE — Progress Notes (Signed)
Primary Care Physician: Carol Ada, MD Referring Physician: Dr. Rayann Heman( previous Dr. Caryl Comes)   Linda Foster is a 65 y.o. female with a h/o paroxysmal afib that was seen  in the afib clinic early fall  for afib being found at time of preop for hip surgery .  BB was restarted and she was in SR today when seen in the clinic.  She went on to have her hip surgery and did well without any issues with afib.  She has felt a few flutters since  then but short lived. Not on anticoagulation with CHA2DS2VASc score of 1 for female.  F/u in afib clinic, 05/28/20. Pt is here as she had irregular heart beat that started Tuesday pm after returning from  the beach. She considered taking extra metoprolol but did not. She is not on anticoagulation as she has a CHA2DS2VASc score of 1. She feels better today and EKG shows SR. No alcohol, heavy caffeine use, no tobacco use.   Follow up in the AF clinic 11/25/20. Patient reports that around 3:30 AM today, she felt much more SOB and she checked her heart rate which was around 160 bpm. There were no specific triggers that she could identify. She is not currently on anticoagulation.   Today, she denies symptoms of chest pain, orthopnea, PND, lower extremity edema, dizziness, presyncope, syncope, or neurologic sequela. The patient is tolerating medications without difficulties and is otherwise without complaint today.   Past Medical History:  Diagnosis Date  . Breast cancer ([facility]) 2008   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  . Breast cancer ([facility]) 2019   left ER/PR +  . Family history of adverse reaction to anesthesia    N&V  . Family history of prostate cancer   . Lymphedema 2013  . OSA (obstructive sleep apnea)    uses a dental appliance  . Osteoarthritis   . Paroxysmal atrial fibrillation (HCC)    History of Afib  . PONV (postoperative nausea and vomiting)    Past Surgical History:  Procedure Laterality Date  . ABDOMINAL HYSTERECTOMY    . BREAST LUMPECTOMY Left  2008  . BREAST LUMPECTOMY WITH RADIOACTIVE SEED LOCALIZATION Left 12/05/2017   Procedure: BREAST LUMPECTOMY WITH RADIOACTIVE SEED LOCALIZATION;  Surgeon: Rolm Bookbinder, MD;  Location: [facility];  Service: General;  Laterality: Left;  . BREAST RECONSTRUCTION Left 2019   no BP on Lt arm  . CESAREAN SECTION      x 3  . KNEE ARTHROSCOPY Right 2007  . TOTAL HIP ARTHROPLASTY Left 06/15/2019   Procedure: LEFT TOTAL HIP ARTHROPLASTY ANTERIOR APPROACH;  Surgeon: Mcarthur Rossetti, MD;  Location: [facility];  Service: Orthopedics;  Laterality: Left;    Current Outpatient Medications  Medication Sig Dispense Refill  . anastrozole (ARIMIDEX) 1 MG tablet Take 1 tablet (1 mg total) by mouth daily. 90 tablet 4  . b complex vitamins capsule Take 1 capsule by mouth daily.    . Cholecalciferol (VITAMIN D3) 2000 units TABS Take 2,000 Units by mouth daily.    . furosemide (LASIX) 40 MG tablet Take 1 tablet (40 mg total) by mouth daily. 90 tablet 3  . glucosamine-chondroitin 500-400 MG tablet Take 2 tablets by mouth daily.    Marland Kitchen ibuprofen (ADVIL,MOTRIN) 200 MG tablet Take 400 mg by mouth as needed for moderate pain.     . metoprolol tartrate (LOPRESSOR) 25 MG tablet TAKE 1 TABLET BY MOUTH TWICE A DAY 180 tablet 1  . MULTIPLE VITAMIN PO  Take 2 tablets by mouth daily.    . naproxen (NAPROSYN) 500 MG tablet Take 500 mg by mouth 2 (two) times daily.     No current facility-administered medications for this visit.    No Known Allergies  Social History   Socioeconomic History  . Marital status: Married    Spouse name: Not on file  . Number of children: Not on file  . Years of education: Not on file  . Highest education level: Not on file  Occupational History  . Not on file  Tobacco Use  . Smoking status: Never Smoker  . Smokeless tobacco: Never Used  Vaping Use  . Vaping Use: Never used  Substance and Sexual Activity  . Alcohol use: No  . Drug use: No  . Sexual activity: Not  on file  Other Topics Concern  . Not on file  Social History Narrative  . Not on file   Social Determinants of Health   Financial Resource Strain: Not on file  Food Insecurity: Not on file  Transportation Needs: Not on file  Physical Activity: Not on file  Stress: Not on file  Social Connections: Not on file  Intimate Partner Violence: Not on file    Family History  Problem Relation Age of Onset  . Hypertension Mother   . COPD Father   . Prostate cancer Father 76  . Lung cancer Brother 27  . Prostate cancer Brother 42  . Cancer Cousin        type unk dx. >50  . Cancer Cousin 58       type unk  . Cancer Cousin        type and age dx unkown    ROS- All systems are reviewed and negative except as per the HPI above  Physical Exam: There were no vitals filed for this visit. Wt Readings from Last 3 Encounters:  06/17/20 99 kg  05/29/20 97.8 kg  01/15/20 97 kg    Labs: Lab Results  Component Value Date   NA 143 06/17/2020   K 3.9 06/17/2020   CL 104 06/17/2020   CO2 32 06/17/2020   GLUCOSE 88 06/17/2020   BUN 16 06/17/2020   CREATININE 0.82 06/17/2020   CALCIUM 9.7 06/17/2020   MG 2.2 11/02/2016   Lab Results  Component Value Date   INR 1.02 11/03/2016   Lab Results  Component Value Date   CHOL 232 (H) 11/03/2016   HDL 74 11/03/2016   LDLCALC 145 (H) 11/03/2016   TRIG 63 11/03/2016    GEN- The patient is a well appearing obese female, alert and oriented x 3 today.   HEENT-head normocephalic, atraumatic, sclera clear, conjunctiva pink, hearing intact, trachea midline. Lungs- Clear to ausculation bilaterally, normal work of breathing Heart- irregular rate and rhythm, tachycardia, no murmurs, rubs or gallops  GI- soft, NT, ND, + BS Extremities- no clubbing, cyanosis, or edema MS- no significant deformity or atrophy Skin- no rash or lesion Psych- euthymic mood, full affect Neuro- strength and sensation are intact   EKG- afib HR 128, QRS 84, QTc  478  Echo 07/04/19 1. Left ventricular ejection fraction, by visual estimation, is 55%. The  left ventricle has normal function. Normal left ventricular size. There is  no left ventricular hypertrophy. Normal wall motion. GLS -19.9%. Normal  diastolic function.  2. Global right ventricle has normal systolic function.The right  ventricular size is normal. No increase in right ventricular wall  thickness.  3. Left atrial size was  mildly dilated.  4. Right atrial size was normal.  5. The mitral valve is normal in structure. Trace mitral valve  regurgitation. No evidence of mitral stenosis.  6. The tricuspid valve is normal in structure. Tricuspid valve  regurgitation is trivial.  7. The aortic valve is tricuspid Aortic valve regurgitation was not  visualized by color flow Doppler. Mild aortic valve sclerosis without  stenosis.  8. The inferior vena cava is dilated in size with >50% respiratory  variability, suggesting right atrial pressure of 8 mmHg.  9. The tricuspid regurgitant velocity is 2.62 m/s, and with an assumed  right atrial pressure of 8 mmHg, the estimated right ventricular systolic  pressure is mildly elevated at 35.5 mmHg.   Epic records reviewed    Assessment and Plan: 1. Paroxysmal afib Patient in rapid afib today, clear time of onset early this AM. We discussed therapeutic options today.  Will increase metoprolol to 50 mg BID Start Eliquis 5 mg BID in case DCCV is needed. Do not anticipate this will be long term.  Check bmet/CBC  Kardia for home monitoring.  ED precautions given.   2.Chadsvasc score of 1 Long term anticoagulation not indicated at this time.   Follow up in the AF clinic within one week.    [facility] [address] [place], [place] [address] [phone]

## 2020-11-25 NOTE — Patient Instructions (Signed)
Increase metoprolol to 50mg  twice a day -- (take another 25mg  when you get home and 25mg  tonight then start 50mg  twice a day tomorrow)  Start Eliquis 5mg  twice a day

## 2020-11-25 NOTE — H&P (View-Only) (Signed)
Primary Care Physician: Carol Ada, MD Referring Physician: Dr. Rayann Heman( previous Dr. Caryl Comes)   Linda Foster is a 65 y.o. female with a h/o paroxysmal afib that was seen  in the afib clinic early fall  for afib being found at time of preop for hip surgery .  BB was restarted and she was in SR today when seen in the clinic.  She went on to have her hip surgery and did well without any issues with afib.  She has felt a few flutters since  then but short lived. Not on anticoagulation with CHA2DS2VASc score of 1 for female.  F/u in afib clinic, 05/28/20. Pt is here as she had irregular heart beat that started Tuesday pm after returning from  the beach. She considered taking extra metoprolol but did not. She is not on anticoagulation as she has a CHA2DS2VASc score of 1. She feels better today and EKG shows SR. No alcohol, heavy caffeine use, no tobacco use.   Follow up in the AF clinic 11/25/20. Patient reports that around 3:30 AM today, she felt much more SOB and she checked her heart rate which was around 160 bpm. There were no specific triggers that she could identify. She is not currently on anticoagulation.   Today, she denies symptoms of chest pain, orthopnea, PND, lower extremity edema, dizziness, presyncope, syncope, or neurologic sequela. The patient is tolerating medications without difficulties and is otherwise without complaint today.   Past Medical History:  Diagnosis Date  . Breast cancer ([facility]) 2008   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  . Breast cancer ([facility]) 2019   left ER/PR +  . Family history of adverse reaction to anesthesia    N&V  . Family history of prostate cancer   . Lymphedema 2013  . OSA (obstructive sleep apnea)    uses a dental appliance  . Osteoarthritis   . Paroxysmal atrial fibrillation (HCC)    History of Afib  . PONV (postoperative nausea and vomiting)    Past Surgical History:  Procedure Laterality Date  . ABDOMINAL HYSTERECTOMY    . BREAST LUMPECTOMY Left  2008  . BREAST LUMPECTOMY WITH RADIOACTIVE SEED LOCALIZATION Left 12/05/2017   Procedure: BREAST LUMPECTOMY WITH RADIOACTIVE SEED LOCALIZATION;  Surgeon: Rolm Bookbinder, MD;  Location: [facility];  Service: General;  Laterality: Left;  . BREAST RECONSTRUCTION Left 2019   no BP on Lt arm  . CESAREAN SECTION      x 3  . KNEE ARTHROSCOPY Right 2007  . TOTAL HIP ARTHROPLASTY Left 06/15/2019   Procedure: LEFT TOTAL HIP ARTHROPLASTY ANTERIOR APPROACH;  Surgeon: Mcarthur Rossetti, MD;  Location: [facility];  Service: Orthopedics;  Laterality: Left;    Current Outpatient Medications  Medication Sig Dispense Refill  . anastrozole (ARIMIDEX) 1 MG tablet Take 1 tablet (1 mg total) by mouth daily. 90 tablet 4  . b complex vitamins capsule Take 1 capsule by mouth daily.    . Cholecalciferol (VITAMIN D3) 2000 units TABS Take 2,000 Units by mouth daily.    . furosemide (LASIX) 40 MG tablet Take 1 tablet (40 mg total) by mouth daily. 90 tablet 3  . glucosamine-chondroitin 500-400 MG tablet Take 2 tablets by mouth daily.    Marland Kitchen ibuprofen (ADVIL,MOTRIN) 200 MG tablet Take 400 mg by mouth as needed for moderate pain.     . metoprolol tartrate (LOPRESSOR) 25 MG tablet TAKE 1 TABLET BY MOUTH TWICE A DAY 180 tablet 1  . MULTIPLE VITAMIN PO  Take 2 tablets by mouth daily.    . naproxen (NAPROSYN) 500 MG tablet Take 500 mg by mouth 2 (two) times daily.     No current facility-administered medications for this visit.    No Known Allergies  Social History   Socioeconomic History  . Marital status: Married    Spouse name: Not on file  . Number of children: Not on file  . Years of education: Not on file  . Highest education level: Not on file  Occupational History  . Not on file  Tobacco Use  . Smoking status: Never Smoker  . Smokeless tobacco: Never Used  Vaping Use  . Vaping Use: Never used  Substance and Sexual Activity  . Alcohol use: No  . Drug use: No  . Sexual activity: Not  on file  Other Topics Concern  . Not on file  Social History Narrative  . Not on file   Social Determinants of Health   Financial Resource Strain: Not on file  Food Insecurity: Not on file  Transportation Needs: Not on file  Physical Activity: Not on file  Stress: Not on file  Social Connections: Not on file  Intimate Partner Violence: Not on file    Family History  Problem Relation Age of Onset  . Hypertension Mother   . COPD Father   . Prostate cancer Father 32  . Lung cancer Brother 77  . Prostate cancer Brother 58  . Cancer Cousin        type unk dx. >50  . Cancer Cousin 58       type unk  . Cancer Cousin        type and age dx unkown    ROS- All systems are reviewed and negative except as per the HPI above  Physical Exam: There were no vitals filed for this visit. Wt Readings from Last 3 Encounters:  06/17/20 99 kg  05/29/20 97.8 kg  01/15/20 97 kg    Labs: Lab Results  Component Value Date   NA 143 06/17/2020   K 3.9 06/17/2020   CL 104 06/17/2020   CO2 32 06/17/2020   GLUCOSE 88 06/17/2020   BUN 16 06/17/2020   CREATININE 0.82 06/17/2020   CALCIUM 9.7 06/17/2020   MG 2.2 11/02/2016   Lab Results  Component Value Date   INR 1.02 11/03/2016   Lab Results  Component Value Date   CHOL 232 (H) 11/03/2016   HDL 74 11/03/2016   LDLCALC 145 (H) 11/03/2016   TRIG 63 11/03/2016    GEN- The patient is a well appearing obese female, alert and oriented x 3 today.   HEENT-head normocephalic, atraumatic, sclera clear, conjunctiva pink, hearing intact, trachea midline. Lungs- Clear to ausculation bilaterally, normal work of breathing Heart- irregular rate and rhythm, tachycardia, no murmurs, rubs or gallops  GI- soft, NT, ND, + BS Extremities- no clubbing, cyanosis, or edema MS- no significant deformity or atrophy Skin- no rash or lesion Psych- euthymic mood, full affect Neuro- strength and sensation are intact   EKG- afib HR 128, QRS 84, QTc  478  Echo 07/04/19 1. Left ventricular ejection fraction, by visual estimation, is 55%. The  left ventricle has normal function. Normal left ventricular size. There is  no left ventricular hypertrophy. Normal wall motion. GLS -19.9%. Normal  diastolic function.  2. Global right ventricle has normal systolic function.The right  ventricular size is normal. No increase in right ventricular wall  thickness.  3. Left atrial size was  mildly dilated.  4. Right atrial size was normal.  5. The mitral valve is normal in structure. Trace mitral valve  regurgitation. No evidence of mitral stenosis.  6. The tricuspid valve is normal in structure. Tricuspid valve  regurgitation is trivial.  7. The aortic valve is tricuspid Aortic valve regurgitation was not  visualized by color flow Doppler. Mild aortic valve sclerosis without  stenosis.  8. The inferior vena cava is dilated in size with >50% respiratory  variability, suggesting right atrial pressure of 8 mmHg.  9. The tricuspid regurgitant velocity is 2.62 m/s, and with an assumed  right atrial pressure of 8 mmHg, the estimated right ventricular systolic  pressure is mildly elevated at 35.5 mmHg.   Epic records reviewed    Assessment and Plan: 1. Paroxysmal afib Patient in rapid afib today, clear time of onset early this AM. We discussed therapeutic options today.  Will increase metoprolol to 50 mg BID Start Eliquis 5 mg BID in case DCCV is needed. Do not anticipate this will be long term.  Check bmet/CBC  Kardia for home monitoring.  ED precautions given.   2.Chadsvasc score of 1 Long term anticoagulation not indicated at this time.   Follow up in the AF clinic within one week.    [facility] [address] [place], [place] [address] [phone]

## 2020-11-27 ENCOUNTER — Telehealth: Payer: Self-pay | Admitting: Internal Medicine

## 2020-11-27 ENCOUNTER — Other Ambulatory Visit (HOSPITAL_COMMUNITY): Payer: Self-pay | Admitting: *Deleted

## 2020-11-27 DIAGNOSIS — I4819 Other persistent atrial fibrillation: Secondary | ICD-10-CM

## 2020-11-27 MED ORDER — METOPROLOL TARTRATE 25 MG PO TABS
75.0000 mg | ORAL_TABLET | Freq: Two times a day (BID) | ORAL | 1 refills | Status: DC
Start: 2020-11-27 — End: 2020-12-10

## 2020-11-27 NOTE — Telephone Encounter (Signed)
Called patient in regards to Afib.  Pt expressed that her HR was 160 this AM and she feels a little short of breath.  She is requesting to have a DCCV this week. She was seen at Afib clinic on 11/25/20.  At this visit metoprolol was increased to 50 mg PO BID and started on Eliquis 5 mg PO BID.  She has only taken the increased dose of metoprolol for 1 day.  She expressed that she has had a previous DCCV. I let her know that Dr. Rayann Heman is not in the office today.  I advised her to reach out to the A fib clinic I gave her there phone number and told her to call the office for recommendation.  She stated she would. She is requesting a call back from Dr. Rayann Heman and/or his nurse.  I told her I would pass the message to Doctor and nurse.

## 2020-11-27 NOTE — Telephone Encounter (Signed)
    Pt said she's been in Afib since Tuesday, she went to Afib clinic Tuesday and changed some of her meds, she was told that if the meds didn't work she may need a cardioversion. She would like to Speak with Dr. Jackalyn Lombard nurse if its possible to get cardioversion done before this weekend since she have plans to do this weekend. If not what Dr. Rayann Heman recommends to control her Afib.

## 2020-11-27 NOTE — Telephone Encounter (Addendum)
Discussed with Malka So PA will go ahead and schedule for TEE/DCCV slots available for Monday - pt agreeable to this. Increase metoprolol to 75mg  twice a day. Day of cardioversion will return to normal dosing of metoprolol.  Go to main entrance at 1030am NPO after MN. Driver needed for procedure. No missed doses of Eliquis since starting on 3/15.  Pt aware will be TEE and Cardioversion. Will see back after cardioversion. All patients questions answered.

## 2020-11-29 ENCOUNTER — Other Ambulatory Visit (HOSPITAL_COMMUNITY)
Admission: RE | Admit: 2020-11-29 | Discharge: 2020-11-29 | Disposition: A | Discharge status: Home / Self Care | Payer: BC Managed Care – PPO | Source: Ambulatory Visit | Attending: Cardiology | Admitting: Cardiology

## 2020-11-29 DIAGNOSIS — Z01812 Encounter for preprocedural laboratory examination: Secondary | ICD-10-CM | POA: Insufficient documentation

## 2020-11-29 DIAGNOSIS — Z20822 Contact with and (suspected) exposure to covid-19: Secondary | ICD-10-CM | POA: Insufficient documentation

## 2020-11-29 DIAGNOSIS — I480 Paroxysmal atrial fibrillation: Secondary | ICD-10-CM | POA: Diagnosis not present

## 2020-11-29 DIAGNOSIS — Z7901 Long term (current) use of anticoagulants: Secondary | ICD-10-CM | POA: Diagnosis not present

## 2020-11-29 DIAGNOSIS — Z79899 Other long term (current) drug therapy: Secondary | ICD-10-CM | POA: Diagnosis not present

## 2020-11-29 LAB — SARS CORONAVIRUS 2 (TAT 6-24 HRS): SARS Coronavirus 2: NEGATIVE

## 2020-12-01 ENCOUNTER — Ambulatory Visit (HOSPITAL_COMMUNITY): Payer: BC Managed Care – PPO | Admitting: Physician Assistant

## 2020-12-01 ENCOUNTER — Encounter (HOSPITAL_COMMUNITY)
Admission: RE | Disposition: A | Discharge status: Home / Self Care | Payer: Self-pay | Source: Home / Self Care | Attending: Cardiology

## 2020-12-01 ENCOUNTER — Ambulatory Visit (HOSPITAL_COMMUNITY)
Admission: RE | Admit: 2020-12-01 | Discharge: 2020-12-01 | Disposition: A | Discharge status: Home / Self Care | Payer: BC Managed Care – PPO | Attending: Cardiology | Admitting: Cardiology

## 2020-12-01 ENCOUNTER — Ambulatory Visit (HOSPITAL_COMMUNITY): Payer: BC Managed Care – PPO | Admitting: Certified Registered"

## 2020-12-01 ENCOUNTER — Encounter (HOSPITAL_COMMUNITY): Payer: Self-pay | Admitting: Cardiology

## 2020-12-01 ENCOUNTER — Ambulatory Visit (HOSPITAL_BASED_OUTPATIENT_CLINIC_OR_DEPARTMENT_OTHER)
Admission: RE | Admit: 2020-12-01 | Discharge: 2020-12-01 | Disposition: A | Discharge status: Home / Self Care | Payer: BC Managed Care – PPO | Source: Home / Self Care | Attending: Physician Assistant | Admitting: Physician Assistant

## 2020-12-01 ENCOUNTER — Other Ambulatory Visit: Payer: Self-pay

## 2020-12-01 DIAGNOSIS — Z7901 Long term (current) use of anticoagulants: Secondary | ICD-10-CM | POA: Insufficient documentation

## 2020-12-01 DIAGNOSIS — Z79899 Other long term (current) drug therapy: Secondary | ICD-10-CM | POA: Insufficient documentation

## 2020-12-01 DIAGNOSIS — I480 Paroxysmal atrial fibrillation: Secondary | ICD-10-CM | POA: Diagnosis not present

## 2020-12-01 DIAGNOSIS — I340 Nonrheumatic mitral (valve) insufficiency: Secondary | ICD-10-CM | POA: Diagnosis not present

## 2020-12-01 DIAGNOSIS — I4819 Other persistent atrial fibrillation: Secondary | ICD-10-CM

## 2020-12-01 DIAGNOSIS — I4891 Unspecified atrial fibrillation: Secondary | ICD-10-CM

## 2020-12-01 DIAGNOSIS — Z20822 Contact with and (suspected) exposure to covid-19: Secondary | ICD-10-CM | POA: Insufficient documentation

## 2020-12-01 DIAGNOSIS — G4733 Obstructive sleep apnea (adult) (pediatric): Secondary | ICD-10-CM | POA: Diagnosis not present

## 2020-12-01 DIAGNOSIS — I313 Pericardial effusion (noninflammatory): Secondary | ICD-10-CM | POA: Diagnosis not present

## 2020-12-01 DIAGNOSIS — Z8042 Family history of malignant neoplasm of prostate: Secondary | ICD-10-CM | POA: Diagnosis not present

## 2020-12-01 DIAGNOSIS — I081 Rheumatic disorders of both mitral and tricuspid valves: Secondary | ICD-10-CM | POA: Diagnosis not present

## 2020-12-01 HISTORY — PX: TEE WITHOUT CARDIOVERSION: SHX5443

## 2020-12-01 HISTORY — PX: CARDIOVERSION: SHX1299

## 2020-12-01 SURGERY — ECHOCARDIOGRAM, TRANSESOPHAGEAL
Anesthesia: General

## 2020-12-01 MED ORDER — PROPOFOL 500 MG/50ML IV EMUL
INTRAVENOUS | Status: DC | PRN
  Administered 2020-12-01: 120 ug/kg/min via INTRAVENOUS

## 2020-12-01 MED ORDER — SODIUM CHLORIDE 0.9 % IV SOLN: INTRAVENOUS | Status: DC

## 2020-12-01 NOTE — Anesthesia Preprocedure Evaluation (Addendum)
Anesthesia Evaluation  Patient identified by MRN, date of birth, ID band Patient awake    Reviewed: Allergy & Precautions, NPO status , Patient's Chart, lab work & pertinent test results  History of Anesthesia Complications (+) PONV and history of anesthetic complications  Airway Mallampati: II  TM Distance: >3 FB Neck ROM: Full    Dental  (+) Teeth Intact, Dental Advisory Given   Pulmonary sleep apnea ,    Pulmonary exam normal breath sounds clear to auscultation       Cardiovascular negative cardio ROS   Rhythm:Irregular Rate:Abnormal     Neuro/Psych negative neurological ROS     GI/Hepatic negative GI ROS, Neg liver ROS,   Endo/Other  negative endocrine ROS  Renal/GU negative Renal ROS     Musculoskeletal  (+) Arthritis ,   Abdominal   Peds  Hematology negative hematology ROS (+)   Anesthesia Other Findings Day of surgery medications reviewed with the patient.  Breast cancer  Reproductive/Obstetrics                             Anesthesia Physical Anesthesia Plan  ASA: III  Anesthesia Plan: General   Post-op Pain Management:    Induction: Intravenous  PONV Risk Score and Plan: 4 or greater and Propofol infusion  Airway Management Planned: Nasal Cannula and Natural Airway  Additional Equipment:   Intra-op Plan:   Post-operative Plan:   Informed Consent: I have reviewed the patients History and Physical, chart, labs and discussed the procedure including the risks, benefits and alternatives for the proposed anesthesia with the patient or authorized representative who has indicated his/her understanding and acceptance.     Dental advisory given  Plan Discussed with: CRNA and Anesthesiologist  Anesthesia Plan Comments:         Anesthesia Quick Evaluation

## 2020-12-01 NOTE — Transfer of Care (Signed)
Immediate Anesthesia Transfer of Care Note  Patient: Linda Foster  Procedure(s) Performed: TRANSESOPHAGEAL ECHOCARDIOGRAM (TEE) (N/A ) CARDIOVERSION (N/A )  Patient Location: Endoscopy Unit  Anesthesia Type:General  Level of Consciousness: drowsy and patient cooperative  Airway & Oxygen Therapy: Patient Spontanous Breathing and Patient connected to nasal cannula oxygen  Post-op Assessment: Report given to RN, Post -op Vital signs reviewed and stable and Patient moving all extremities  Post vital signs: Reviewed and stable  Last Vitals:  Vitals Value Taken Time  BP    Temp    Pulse    Resp    SpO2      Last Pain:  Vitals:   12/01/20 1101  TempSrc: Oral  PainSc: 0-No pain         Complications: No complications documented.

## 2020-12-01 NOTE — Procedures (Signed)
Electrical Cardioversion Procedure Note Linda Foster [id number] 1955-11-18  Procedure: Electrical Cardioversion Indications:  Atrial Fibrillation  Procedure Details Consent: Risks of procedure as well as the alternatives and risks of each were explained to the (patient/caregiver).  Consent for procedure obtained. Time Out: Verified patient identification, verified procedure, site/side was marked, verified correct patient position, special equipment/implants available, medications/allergies/relevent history reviewed, required imaging and test results available.  Performed  Patient placed on cardiac monitor, pulse oximetry, supplemental oxygen as necessary.  Sedation given: Propofol per anesthesiology Pacer pads placed anterior and posterior chest.  Cardioverted 1 time(s).  Cardioverted at [facility].  Evaluation Findings: Post procedure EKG shows: NSR Complications: None Patient did tolerate procedure well.   [name] 12/01/2020, 12:12 PM

## 2020-12-01 NOTE — CV Procedure (Addendum)
Procedure: TEE  Sedation: Per anesthesiology.   Indication: Atrial fibrillation.   Findings: Please see echo section for full report.  Normal LV size and wall thickness.  EF 25-30%, diffuse hypokinesis (patient in atrial fibrillation with rate around 140 when TEE done).  Normal RV size with mildly decreased systolic function.  Mild right atrial enlargement.  Moderate-severe left atrial enlargement, no LA appendage thrombus.  Trivial TR, peak RV-RA gradient 22 mmHg.  Trileaflet aortic valve, no significant regurgitation or stenosis.  Mild mitral regurgitation.  No ASD/PFO by color doppler.  Normal caliber thoracic aorta with minimal plaque. There was a small pericardial effusion.   Impression: EF low in the setting of AF with RVR.  May proceed to DCCV.   Linda Foster 12/01/2020 12:12 PM

## 2020-12-01 NOTE — Interval H&P Note (Signed)
History and Physical Interval Note:  12/01/2020 11:59 AM  Linda Foster  has presented today for surgery, with the diagnosis of AFIB.  The various methods of treatment have been discussed with the patient and family. After consideration of risks, benefits and other options for treatment, the patient has consented to  Procedure(s): TRANSESOPHAGEAL ECHOCARDIOGRAM (TEE) (N/A) CARDIOVERSION (N/A) as a surgical intervention.  The patient's history has been reviewed, patient examined, no change in status, stable for surgery.  I have reviewed the patient's chart and labs.  Questions were answered to the patient's satisfaction.     Joson Sapp [employer]

## 2020-12-01 NOTE — Progress Notes (Signed)
  Echocardiogram 2D Echocardiogram with color and spectral doppler has been performed.  Darlina Sicilian M 12/01/2020, 12:28 PM

## 2020-12-01 NOTE — Discharge Instructions (Signed)
Electrical Cardioversion Electrical cardioversion is the delivery of a jolt of electricity to restore a normal rhythm to the heart. A rhythm that is too fast or is not regular keeps the heart from pumping well. In this procedure, sticky patches or metal paddles are placed on the chest to deliver electricity to the heart from a device. This procedure may be done in an emergency if:  There is low or no blood pressure as a result of the heart rhythm.  Normal rhythm must be restored as fast as possible to protect the brain and heart from further damage.  It may save a life. This may also be a scheduled procedure for irregular or fast heart rhythms that are not immediately life-threatening. Tell a health care provider about:  Any allergies you have.  All medicines you are taking, including vitamins, herbs, eye drops, creams, and over-the-counter medicines.  Any problems you or family members have had with anesthetic medicines.  Any blood disorders you have.  Any surgeries you have had.  Any medical conditions you have.  Whether you are pregnant or may be pregnant. What are the risks? Generally, this is a safe procedure. However, problems may occur, including:  Allergic reactions to medicines.  A blood clot that breaks free and travels to other parts of your body.  The possible return of an abnormal heart rhythm within hours or days after the procedure.  Your heart stopping (cardiac arrest). This is rare. What happens before the procedure? Medicines  Your health care provider may have you start taking: ? Blood-thinning medicines (anticoagulants) so your blood does not clot as easily. ? Medicines to help stabilize your heart rate and rhythm.  Ask your health care provider about: ? Changing or stopping your regular medicines. This is especially important if you are taking diabetes medicines or blood thinners. ? Taking medicines such as aspirin and ibuprofen. These medicines can  thin your blood. Do not take these medicines unless your health care provider tells you to take them. ? Taking over-the-counter medicines, vitamins, herbs, and supplements. General instructions  Follow instructions from your health care provider about eating or drinking restrictions.  Plan to have someone take you home from the hospital or clinic.  If you will be going home right after the procedure, plan to have someone with you for 24 hours.  Ask your health care provider what steps will be taken to help prevent infection. These may include washing your skin with a germ-killing soap. What happens during the procedure?  An IV will be inserted into one of your veins.  Sticky patches (electrodes) or metal paddles may be placed on your chest.  You will be given a medicine to help you relax (sedative).  An electrical shock will be delivered. The procedure may vary among health care providers and hospitals.   What can I expect after the procedure?  Your blood pressure, heart rate, breathing rate, and blood oxygen level will be monitored until you leave the hospital or clinic.  Your heart rhythm will be watched to make sure it does not change.  You may have some redness on the skin where the shocks were given. Follow these instructions at home:  Do not drive for 24 hours if you were given a sedative during your procedure.  Take over-the-counter and prescription medicines only as told by your health care provider.  Ask your health care provider how to check your pulse. Check it often.  Rest for 48 hours after the procedure   or as told by your health care provider.  Avoid or limit your caffeine use as told by your health care provider.  Keep all follow-up visits as told by your health care provider. This is important. Contact a health care provider if:  You feel like your heart is beating too quickly or your pulse is not regular.  You have a serious muscle cramp that does not go  away. Get help right away if:  You have discomfort in your chest.  You are dizzy or you feel faint.  You have trouble breathing or you are short of breath.  Your speech is slurred.  You have trouble moving an arm or leg on one side of your body.  Your fingers or toes turn cold or blue. Summary  Electrical cardioversion is the delivery of a jolt of electricity to restore a normal rhythm to the heart.  This procedure may be done right away in an emergency or may be a scheduled procedure if the condition is not an emergency.  Generally, this is a safe procedure.  After the procedure, check your pulse often as told by your health care provider. This information is not intended to replace advice given to you by your health care provider. Make sure you discuss any questions you have with your health care provider. Document Revised: 04/02/2019 Document Reviewed: 04/02/2019 Elsevier Patient Education  2021 Elsevier Inc. Transesophageal Echocardiogram Transesophageal echocardiogram (TEE) is a test that uses sound waves to take pictures of your heart. TEE is done by passing a small probe attached to a flexible tube down the part of the body that moves food from your mouth to your stomach (esophagus). The pictures give clear images of your heart. This can help your doctor see if there are problems with your heart. Tell a doctor about:  Any allergies you have.  All medicines you are taking. This includes vitamins, herbs, eye drops, creams, and over-the-counter medicines.  Any problems you or family members have had with anesthetic medicines.  Any blood disorders you have.  Any surgeries you have had.  Any medical conditions you have.  Any swallowing problems.  Whether you have or have had a blockage in the part of the body that moves food from your mouth to your stomach.  Whether you are pregnant or may be pregnant. What are the risks? In general, this is a safe procedure. But,  problems may occur, such as:  Damage to nearby structures or organs.  A tear in the part of the body that moves food from your mouth to your stomach.  Irregular heartbeat.  Hoarse voice or trouble swallowing.  Bleeding. What happens before the procedure? Medicines  Ask your doctor about changing or stopping: ? Your normal medicines. ? Vitamins, herbs, and supplements. ? Over-the-counter medicines.  Do not take aspirin or ibuprofen unless you are told to. General instructions  Follow instructions from your doctor about what you cannot eat or drink.  You will take out any dentures or dental retainers.  Plan to have a responsible adult take you home from the hospital or clinic.  Plan to have a responsible adult care for you for the time you are told after you leave the hospital or clinic. This is important. What happens during the procedure?  An IV will be put into one of your veins.  You may be given: ? A sedative. This medicine helps you relax. ? A medicine to numb the back of your throat. This may be   sprayed or gargled.  Your blood pressure, heart rate, and breathing will be watched.  You may be asked to lie on your left side.  A bite block will be placed in your mouth. This keeps you from biting the tube.  The tip of the probe will be placed into the back of your mouth.  You will be asked to swallow.  Your doctor will take pictures of your heart.  The probe and bite block will be taken out after the test is done. The procedure may vary among doctors and hospitals.   What can I expect after the procedure?  You will be monitored until you leave the hospital or clinic. This includes checking your blood pressure, heart rate, breathing rate, and blood oxygen level.  Your throat may feel sore and numb. This will get better over time. You will not be allowed to eat or drink until the numbness has gone away.  It is common to have a sore throat for a day or two.  It  is up to you to get the results of your procedure. Ask how to get your results when they are ready. Follow these instructions at home:  If you were given a sedative during your procedure, do not drive or use machines until your doctor says that it is safe.  Return to your normal activities when your doctor says that it is safe.  Keep all follow-up visits. Summary  TEE is a test that uses sound waves to take pictures of your heart.  You will be given a medicine to help you relax.  Do not drive or use machines until your doctor says that it is safe. This information is not intended to replace advice given to you by your health care provider. Make sure you discuss any questions you have with your health care provider. Document Revised: 04/22/2020 Document Reviewed: 04/22/2020 Elsevier Patient Education  2021 Elsevier Inc.  

## 2020-12-02 ENCOUNTER — Telehealth: Payer: Self-pay | Admitting: Internal Medicine

## 2020-12-02 NOTE — Telephone Encounter (Signed)
Discussed metoprolol and Eliquis with the patient.  Verbalized understanding.

## 2020-12-02 NOTE — Telephone Encounter (Signed)
New message:     Patient calling to get more instructions on how she should be taking her medications from her percegure yesterday.

## 2020-12-03 ENCOUNTER — Encounter (HOSPITAL_COMMUNITY): Payer: Self-pay | Admitting: Cardiology

## 2020-12-03 NOTE — Anesthesia Postprocedure Evaluation (Signed)
Anesthesia Post Note  Patient: Linda Foster  Procedure(s) Performed: TRANSESOPHAGEAL ECHOCARDIOGRAM (TEE) (N/A ) CARDIOVERSION (N/A )     Patient location during evaluation: Endoscopy Anesthesia Type: General Level of consciousness: awake and alert Pain management: pain level controlled Vital Signs Assessment: post-procedure vital signs reviewed and stable Respiratory status: spontaneous breathing, nonlabored ventilation, respiratory function stable and patient connected to nasal cannula oxygen Cardiovascular status: blood pressure returned to baseline and stable Postop Assessment: no apparent nausea or vomiting Anesthetic complications: no   No complications documented.  Last Vitals:  Vitals:   12/01/20 1225 12/01/20 1235  BP: 128/73 138/73  Pulse: 62 (!) 59  Resp: 19 14  Temp:    SpO2: 100% 100%    Last Pain:  Vitals:   12/01/20 1235  TempSrc:   PainSc: 0-No pain                 Irean Kendricks

## 2020-12-05 ENCOUNTER — Ambulatory Visit (HOSPITAL_COMMUNITY)
Admission: RE | Admit: 2020-12-05 | Discharge: 2020-12-05 | Disposition: A | Discharge status: Home / Self Care | Payer: BC Managed Care – PPO | Source: Ambulatory Visit | Attending: Physician Assistant | Admitting: Physician Assistant

## 2020-12-05 ENCOUNTER — Telehealth: Payer: Self-pay | Admitting: Internal Medicine

## 2020-12-05 ENCOUNTER — Other Ambulatory Visit: Payer: Self-pay

## 2020-12-05 ENCOUNTER — Encounter (HOSPITAL_COMMUNITY): Payer: Self-pay | Admitting: Nurse Practitioner

## 2020-12-05 VITALS — BP 110/72 | HR 125 | Ht 66.0 in | Wt 212.4 lb

## 2020-12-05 DIAGNOSIS — Z79899 Other long term (current) drug therapy: Secondary | ICD-10-CM | POA: Diagnosis not present

## 2020-12-05 DIAGNOSIS — Z7901 Long term (current) use of anticoagulants: Secondary | ICD-10-CM | POA: Diagnosis not present

## 2020-12-05 DIAGNOSIS — Z96642 Presence of left artificial hip joint: Secondary | ICD-10-CM | POA: Diagnosis not present

## 2020-12-05 DIAGNOSIS — I480 Paroxysmal atrial fibrillation: Secondary | ICD-10-CM | POA: Diagnosis not present

## 2020-12-05 DIAGNOSIS — Z9119 Patient's noncompliance with other medical treatment and regimen: Secondary | ICD-10-CM | POA: Diagnosis not present

## 2020-12-05 DIAGNOSIS — D6869 Other thrombophilia: Secondary | ICD-10-CM | POA: Diagnosis not present

## 2020-12-05 DIAGNOSIS — Z791 Long term (current) use of non-steroidal anti-inflammatories (NSAID): Secondary | ICD-10-CM | POA: Diagnosis not present

## 2020-12-05 DIAGNOSIS — I4819 Other persistent atrial fibrillation: Secondary | ICD-10-CM

## 2020-12-05 DIAGNOSIS — G4733 Obstructive sleep apnea (adult) (pediatric): Secondary | ICD-10-CM | POA: Insufficient documentation

## 2020-12-05 MED ORDER — DILTIAZEM HCL 30 MG PO TABS: ORAL_TABLET | ORAL | 1 refills | Status: DC

## 2020-12-05 NOTE — Patient Instructions (Signed)
Cardizem 30mg  -- take 1 tablet every 4 hours AS NEEDED for AFIB heart rate >100 as long as blood pressure >100.

## 2020-12-05 NOTE — Progress Notes (Signed)
Primary Care Physician: Carol Ada, MD Referring Physician: Dr. Rayann Heman( previous Dr. Caryl Comes)   Linda Foster is a 65 y.o. female with a h/o paroxysmal afib that was seen  in the afib clinic early fall  for afib being found at time of preop for hip surgery .  BB was restarted and she was in SR today when seen in the clinic.  She went on to have her hip surgery and did well without any issues with afib.  She has felt a few flutters since  then but short lived. Not on anticoagulation with CHA2DS2VASc score of 1 for female.  F/u in afib clinic, 05/28/20. Pt is here as she had irregular heart beat that started Tuesday pm after returning from  the beach. She considered taking extra metoprolol but did not. She is not on anticoagulation as she has a CHA2DS2VASc score of 1. She feels better today and EKG shows SR. No alcohol, heavy caffeine use, no tobacco use.   Follow up in the AF clinic 11/25/20. Patient reports that around 3:30 AM today, she felt much more SOB and she checked her heart rate which was around 160 bpm. There were no specific triggers that she could identify. She is not currently on anticoagulation.   F/u in afib clnic, 12/05/20. She  had a successful cardioversion  3/21 but returned to afib this am. HR was 150 bpm before her meds this am now atrial flutter at 125 bpm. She is currently on 75 mg metoprolol bid. We discussed options going forward to manage afib. She continues on eliquis 5 mg bid for a CHA2DS2VASc score of 1.   Today, she denies symptoms of chest pain, orthopnea, PND, lower extremity edema, dizziness, presyncope, syncope, or neurologic sequela. The patient is tolerating medications without difficulties and is otherwise without complaint today.   Past Medical History:  Diagnosis Date  . Breast cancer ([facility]) 2008   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  . Breast cancer ([facility]) 2019   left ER/PR +  . Family history of adverse reaction to anesthesia    N&V  . Family history of  prostate cancer   . Lymphedema 2013  . OSA (obstructive sleep apnea)    uses a dental appliance  . Osteoarthritis   . Paroxysmal atrial fibrillation ([facility])    History of Afib  . PONV (postoperative nausea and vomiting)    Past Surgical History:  Procedure Laterality Date  . ABDOMINAL HYSTERECTOMY    . BREAST LUMPECTOMY Left 2008  . BREAST LUMPECTOMY WITH RADIOACTIVE SEED LOCALIZATION Left 12/05/2017   Procedure: BREAST LUMPECTOMY WITH RADIOACTIVE SEED LOCALIZATION;  Surgeon: Rolm Bookbinder, MD;  Location: [facility];  Service: General;  Laterality: Left;  . BREAST RECONSTRUCTION Left 2019   no BP on Lt arm  . CARDIOVERSION N/A 12/01/2020   Procedure: CARDIOVERSION;  Surgeon: Larey Dresser, MD;  Location: [facility] ENDOSCOPY;  Service: Cardiovascular;  Laterality: N/A;  . CESAREAN SECTION      x 3  . KNEE ARTHROSCOPY Right 2007  . TEE WITHOUT CARDIOVERSION N/A 12/01/2020   Procedure: TRANSESOPHAGEAL ECHOCARDIOGRAM (TEE);  Surgeon: Larey Dresser, MD;  Location: [facility] ENDOSCOPY;  Service: Cardiovascular;  Laterality: N/A;  . TOTAL HIP ARTHROPLASTY Left 06/15/2019   Procedure: LEFT TOTAL HIP ARTHROPLASTY ANTERIOR APPROACH;  Surgeon: Mcarthur Rossetti, MD;  Location: [facility] ORS;  Service: Orthopedics;  Laterality: Left;    Current Outpatient Medications  Medication Sig Dispense Refill  . anastrozole (ARIMIDEX) 1 MG tablet  Take 1 tablet (1 mg total) by mouth daily. 90 tablet 4  . apixaban (ELIQUIS) 5 MG TABS tablet Take 1 tablet (5 mg total) by mouth 2 (two) times daily. 60 tablet 3  . b complex vitamins capsule Take 1 capsule by mouth daily.    . Cholecalciferol (VITAMIN D3) 125 MCG (5000 UT) TABS Take 5,000 Units by mouth daily.    Marland Kitchen diltiazem (CARDIZEM) 30 MG tablet Take 1 tablet every 4 hours AS NEEDED for heart rate >100 as long as blood pressure >100. 45 tablet 1  . furosemide (LASIX) 40 MG tablet Take 1 tablet (40 mg total) by mouth daily. 90 tablet 3  . ibuprofen  (ADVIL,MOTRIN) 200 MG tablet Take 400 mg by mouth every 6 (six) hours as needed for moderate pain.    . metoprolol tartrate (LOPRESSOR) 25 MG tablet Take 3 tablets (75 mg total) by mouth 2 (two) times daily. 180 tablet 1  . Multiple Vitamin tablet Take 2 tablets by mouth daily.    . naproxen (NAPROSYN) 500 MG tablet Take 500 mg by mouth daily.     No current facility-administered medications for this encounter.    No Known Allergies  Social History   Socioeconomic History  . Marital status: Married    Spouse name: Not on file  . Number of children: Not on file  . Years of education: Not on file  . Highest education level: Not on file  Occupational History  . Not on file  Tobacco Use  . Smoking status: Never Smoker  . Smokeless tobacco: Never Used  Vaping Use  . Vaping Use: Never used  Substance and Sexual Activity  . Alcohol use: No  . Drug use: No  . Sexual activity: Not on file  Other Topics Concern  . Not on file  Social History Narrative  . Not on file   Social Determinants of Health   Financial Resource Strain: Not on file  Food Insecurity: Not on file  Transportation Needs: Not on file  Physical Activity: Not on file  Stress: Not on file  Social Connections: Not on file  Intimate Partner Violence: Not on file    Family History  Problem Relation Age of Onset  . Hypertension Mother   . COPD Father   . Prostate cancer Father 35  . Lung cancer Brother 53  . Prostate cancer Brother 51  . Cancer Cousin        type unk dx. >50  . Cancer Cousin 58       type unk  . Cancer Cousin        type and age dx unkown    ROS- All systems are reviewed and negative except as per the HPI above  Physical Exam: Vitals:   12/05/20 1118  BP: 110/72  Pulse: (!) 125  Weight: 96.3 kg  Height: 5\' 6"  (1.676 m)   Wt Readings from Last 3 Encounters:  12/05/20 96.3 kg  12/01/20 97.5 kg  11/25/20 98 kg    Labs: Lab Results  Component Value Date   NA 139 11/25/2020    K 3.8 11/25/2020   CL 106 11/25/2020   CO2 26 11/25/2020   GLUCOSE 117 (H) 11/25/2020   BUN 17 11/25/2020   CREATININE 0.81 11/25/2020   CALCIUM 9.3 11/25/2020   MG 2.2 11/02/2016   Lab Results  Component Value Date   INR 1.02 11/03/2016   Lab Results  Component Value Date   CHOL 232 (H) 11/03/2016  HDL 74 11/03/2016   LDLCALC 145 (H) 11/03/2016   TRIG 63 11/03/2016    GEN- The patient is a well appearing obese female, alert and oriented x 3 today.   HEENT-head normocephalic, atraumatic, sclera clear, conjunctiva pink, hearing intact, trachea midline. Lungs- Clear to ausculation bilaterally, normal work of breathing Heart- irregular rate and rhythm, tachycardia, no murmurs, rubs or gallops  GI- soft, NT, ND, + BS Extremities- no clubbing, cyanosis, or edema MS- no significant deformity or atrophy Skin- no rash or lesion Psych- euthymic mood, full affect Neuro- strength and sensation are intact   EKG- afib HR 125, QRS 84, QTc 473  Echo 07/04/19 1. Left ventricular ejection fraction, by visual estimation, is 55%. The  left ventricle has normal function. Normal left ventricular size. There is  no left ventricular hypertrophy. Normal wall motion. GLS -19.9%. Normal  diastolic function.  2. Global right ventricle has normal systolic function.The right  ventricular size is normal. No increase in right ventricular wall  thickness.  3. Left atrial size was mildly dilated.  4. Right atrial size was normal.  5. The mitral valve is normal in structure. Trace mitral valve  regurgitation. No evidence of mitral stenosis.  6. The tricuspid valve is normal in structure. Tricuspid valve  regurgitation is trivial.  7. The aortic valve is tricuspid Aortic valve regurgitation was not  visualized by color flow Doppler. Mild aortic valve sclerosis without  stenosis.  8. The inferior vena cava is dilated in size with >50% respiratory  variability, suggesting right atrial  pressure of 8 mmHg.  9. The tricuspid regurgitant velocity is 2.62 m/s, and with an assumed  right atrial pressure of 8 mmHg, the estimated right ventricular systolic  pressure is mildly elevated at 35.5 mmHg.   Epic records reviewed  Echo- 07/04/19- 1. Left ventricular ejection fraction, by visual estimation, is 55%. The left ventricle has normal function. Normal left ventricular size. There is no left ventricular hypertrophy. Normal wall motion. GLS -19.9%. Normal diastolic function. 2. Global right ventricle has normal systolic function.The right ventricular size is normal. No increase in right ventricular wall thickness. 3. Left atrial size was mildly dilated. 4. Right atrial size was normal. 5. The mitral valve is normal in structure. Trace mitral valve regurgitation. No evidence of mitral stenosis. 6. The tricuspid valve is normal in structure. Tricuspid valve regurgitation is trivial. 7. The aortic valve is tricuspid Aortic valve regurgitation was not visualized by color flow Doppler. Mild aortic valve sclerosis without stenosis. 8. The inferior vena cava is dilated in size with >50% respiratory variability, suggesting right atrial pressure of 8 mmHg. 9. The tricuspid regurgitant velocity is 2.62 m/s, and with an assumed right atrial pressure of 8 mmHg, the estimated right ventricular systolic pressure is mildly elevated at 35.5 mmHg  TEE-3/21-Findings: Please see echo section for full report.  Normal LV size and wall thickness.  EF 25-30%, diffuse hypokinesis (patient in atrial fibrillation with rate around 140 when TEE done).  Normal RV size with mildly decreased systolic function.  Mild right atrial enlargement.  Moderate-severe left atrial enlargement, no LA appendage thrombus.  Trivial TR, peak RV-RA gradient 22 mmHg.  Trileaflet aortic valve, no significant regurgitation or stenosis.  Mild mitral regurgitation.  No ASD/PFO by color doppler.  Normal caliber thoracic aorta with  minimal plaque. There was a small pericardial effusion.   Impression: EF low in the setting of AF with RVR.  May proceed to DCCV.   Assessment and Plan: 1.  Paroxysmal afib Patient in rapid afib today, clear time of onset early this AM. Successful CV 3/21 but ERAF We discussed therapeutic options today.   I gave options of short term amiodarone use as a bridge to ablation or tikosyn use, or possibly front line ablation. Pt's husband is not with her today and she feels he would like to further discuss with Dr. Rayann Heman. I did not discusses flecainide or multaq do to reduced  EF by TEE.  Continue increased metoprolol to 75 mg BID Continue  Eliquis 5 mg BID  for a CHA2DS2VASc score of 1  I gave options of short term amiodarone use as a bridge to ablation or tikosyn use, or possibly front line ablation. Pt's husband is not with her today and she feels he would like to further discuss with Dr. Rayann Heman. I did not discusses flecainide or multaq do to reduced  EF by TEE.  She will use 30 mg cardizem in the interim to control v rates   2.Chadsvasc score of 1 Continues  on eliquis 5 mg bid for recent CV  3. OSA Treated by Dr. Maxwell Caul, compliance not optimal,  explained to pt that this could be contributing to afib burden and to please call Dr. Maxwell Caul to see if she can tolerate mask better   Appointment requested to f/u with Dr. Rayann Heman 3/30 at [place]. [place], [facility] [address] [place], [place] [address] [phone]

## 2020-12-05 NOTE — Telephone Encounter (Signed)
Call transferred to me in triage.  Pt reporting afib (via Kardia device) since last evening around 6pm.  She has a slight headache and feels the irregular heart beat, feels like she cant get a deep breath but no real SOB.  No chest pain.  After DCCV on 3/21 she was in sinus and feeling well.  She was instructed to go back to her usual dose of lopressor 25 mg BID. (was on 75 mg BID for several days prior to cardioversion).  Last night she took 50 mg and this am took 75 mg of lopressor and current HR is 154, BP 132/109.    Reviewed with AFIB clinic, pt added to schedule this morning.  Pt aware she is being worked in and may need to wait a little bit.  She is appreciative for assistance.

## 2020-12-05 NOTE — Telephone Encounter (Signed)
Patient c/o Palpitations:  High priority if patient c/o lightheadedness, shortness of breath, or chest pain  1) How long have you had palpitations/irregular HR/ Afib? Are you having the symptoms now?  Patient states she has been in afib for the past 2 weeks. After 12/01/20 cardioversion she states she was better through 03/22 and 03/23, but yesterday she went into afib again.  She states she is still currently in afib and her HR is extremely elevated.  2) Are you currently experiencing lightheadedness, SOB or CP? No  3) Do you have a history of afib (atrial fibrillation) or irregular heart rhythm? Yes   4) Have you checked your BP or HR? (document readings if available):  12/05/20:  HR- 154  12/04/20:  HR- 142, 144, 151   5) Are you experiencing any other symptoms? Headaches   STAT if HR is under 50 or over 120 (normal HR is 60-100 beats per minute)  1) What is your heart rate? 154  2) Do you have a log of your heart rate readings (document readings)?  12/05/20:  HR- 154  12/04/20:  HR- 142, 144, 151  3) Do you have any other symptoms?  Afib, headaches

## 2020-12-07 NOTE — Telephone Encounter (Signed)
[facility] am not involved in patients care.

## 2020-12-08 ENCOUNTER — Other Ambulatory Visit (HOSPITAL_COMMUNITY): Payer: Self-pay | Admitting: *Deleted

## 2020-12-08 ENCOUNTER — Ambulatory Visit (HOSPITAL_COMMUNITY): Payer: BC Managed Care – PPO | Admitting: Physician Assistant

## 2020-12-08 MED ORDER — APIXABAN 5 MG PO TABS: 5.0000 mg | ORAL_TABLET | Freq: Two times a day (BID) | ORAL | 3 refills | Status: DC

## 2020-12-10 ENCOUNTER — Other Ambulatory Visit: Payer: Self-pay

## 2020-12-10 ENCOUNTER — Encounter: Payer: Self-pay | Admitting: *Deleted

## 2020-12-10 ENCOUNTER — Ambulatory Visit (INDEPENDENT_AMBULATORY_CARE_PROVIDER_SITE_OTHER): Payer: BC Managed Care – PPO | Admitting: Internal Medicine

## 2020-12-10 ENCOUNTER — Encounter: Payer: Self-pay | Admitting: Internal Medicine

## 2020-12-10 VITALS — BP 106/74 | HR 116 | Ht 66.0 in | Wt 211.4 lb

## 2020-12-10 DIAGNOSIS — I4819 Other persistent atrial fibrillation: Secondary | ICD-10-CM

## 2020-12-10 DIAGNOSIS — I4891 Unspecified atrial fibrillation: Secondary | ICD-10-CM | POA: Diagnosis not present

## 2020-12-10 DIAGNOSIS — D6869 Other thrombophilia: Secondary | ICD-10-CM

## 2020-12-10 DIAGNOSIS — I519 Heart disease, unspecified: Secondary | ICD-10-CM | POA: Diagnosis not present

## 2020-12-10 MED ORDER — METOPROLOL TARTRATE 100 MG PO TABS: 100.0000 mg | ORAL_TABLET | Freq: Two times a day (BID) | ORAL | 3 refills | Status: DC

## 2020-12-10 NOTE — Patient Instructions (Addendum)
Medication Instructions:  Increase Metoprolol tartrate to 100 mg two times a day Your physician recommends that you continue on your current medications as directed. Please refer to the Current Medication list given to you today.  Labwork: None ordered.  Testing/Procedures: Your physician has requested that you have cardiac CT. Cardiac computed tomography (CT) is a painless test that uses an x-ray machine to take clear, detailed pictures of your heart. For further information please visit [facility]. Please follow instruction sheet as given.  Your physician has recommended that you have an ablation. Catheter ablation is a medical procedure used to treat some cardiac arrhythmias (irregular heartbeats). During catheter ablation, a long, thin, flexible tube is put into a blood vessel in your groin (upper thigh), or neck. This tube is called an ablation catheter. It is then guided to your heart through the blood vessel. Radio frequency waves destroy small areas of heart tissue where abnormal heartbeats may cause an arrhythmia to start. Please see the instruction sheet given to you today.   Any Other Special Instructions Will Be Listed Below (If Applicable).  If you need a refill on your cardiac medications before your next appointment, please call your pharmacy.       Cardiac Ablation Cardiac ablation is a procedure to destroy (ablate) some heart tissue that is sending bad signals. These bad signals cause problems in heart rhythm. The heart has many areas that make these signals. If there are problems in these areas, they can make the heart beat in a way that is not normal. Destroying some tissues can help make the heart rhythm normal. Tell your doctor about:  Any allergies you have.  All medicines you are taking. These include vitamins, herbs, eye drops, creams, and over-the-counter medicines.  Any problems you or family members have had with medicines that make you fall asleep  (anesthetics).  Any blood disorders you have.  Any surgeries you have had.  Any medical conditions you have, such as kidney failure.  Whether you are pregnant or may be pregnant. What are the risks? This is a safe procedure. But problems may occur, including:  Infection.  Bruising and bleeding.  Bleeding into the chest.  Stroke or blood clots.  Damage to nearby areas of your body.  Allergies to medicines or dyes.  The need for a pacemaker if the normal system is damaged.  Failure of the procedure to treat the problem. What happens before the procedure? Medicines Ask your doctor about:  Changing or stopping your normal medicines. This is important.  Taking aspirin and ibuprofen. Do not take these medicines unless your doctor tells you to take them.  Taking other medicines, vitamins, herbs, and supplements. General instructions  Follow instructions from your doctor about what you cannot eat or drink.  Plan to have someone take you home from the hospital or clinic.  If you will be going home right after the procedure, plan to have someone with you for 24 hours.  Ask your doctor what steps will be taken to prevent infection. What happens during the procedure?  An IV tube will be put into one of your veins.  You will be given a medicine to help you relax.  The skin on your neck or groin will be numbed.  A cut (incision) will be made in your neck or groin. A needle will be put through your cut and into a large vein.  A tube (catheter) will be put into the needle. The tube will be moved to  your heart.  Dye may be put through the tube. This helps your doctor see your heart.  Small devices (electrodes) on the tube will send out signals.  A type of energy will be used to destroy some heart tissue.  The tube will be taken out.  Pressure will be held on your cut. This helps stop bleeding.  A bandage will be put over your cut. The exact procedure may vary among  doctors and hospitals.   What happens after the procedure?  You will be watched until you leave the hospital or clinic. This includes checking your heart rate, breathing rate, oxygen, and blood pressure.  Your cut will be watched for bleeding. You will need to lie still for a few hours.  Do not drive for 24 hours or as long as your doctor tells you. Summary  Cardiac ablation is a procedure to destroy some heart tissue. This is done to treat heart rhythm problems.  Tell your doctor about any medical conditions you may have. Tell him or her about all medicines you are taking to treat them.  This is a safe procedure. But problems may occur. These include infection, bruising, bleeding, and damage to nearby areas of your body.  Follow what your doctor tells you about food and drink. You may also be told to change or stop some of your medicines.  After the procedure, do not drive for 24 hours or as long as your doctor tells you. This information is not intended to replace advice given to you by your health care provider. Make sure you discuss any questions you have with your health care provider. Document Revised: 08/02/2019 Document Reviewed: 08/02/2019 Elsevier Patient Education  2021 [employer].

## 2020-12-10 NOTE — Progress Notes (Signed)
PCP: Carol Ada, MD   Primary EP: Dr Wallis Bamberg Linda Foster is a 65 y.o. female who presents today for routine electrophysiology followup.  Since last being seen in our clinic, the patient reports doing reasonably well.  She has had worsening afib.  + fatigue and decreased exercise tolerance.  + reduction in EF.  Recent cardioversion was successful however she returned to afib within 3 days.   Today, she denies symptoms of palpitations, chest pain, shortness of breath,  lower extremity edema, dizziness, presyncope, or syncope.  The patient is otherwise without complaint today.   Past Medical History:  Diagnosis Date  . Breast cancer ([employer]) 2008   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  . Breast cancer ([place]) 2019   left ER/PR +,  XRT and chemotherapy  . Family history of adverse reaction to anesthesia    N&V  . Family history of prostate cancer   . Lymphedema 2013  . OSA (obstructive sleep apnea)    uses a dental appliance  . Osteoarthritis   . Persistent atrial fibrillation ([facility])    History of Afib  . PONV (postoperative nausea and vomiting)    Past Surgical History:  Procedure Laterality Date  . ABDOMINAL HYSTERECTOMY    . BREAST LUMPECTOMY Left 2008  . BREAST LUMPECTOMY WITH RADIOACTIVE SEED LOCALIZATION Left 12/05/2017   Procedure: BREAST LUMPECTOMY WITH RADIOACTIVE SEED LOCALIZATION;  Surgeon: Rolm Bookbinder, MD;  Location: [facility];  Service: General;  Laterality: Left;  . BREAST RECONSTRUCTION Left 2019   no BP on Lt arm  . CARDIOVERSION N/A 12/01/2020   Procedure: CARDIOVERSION;  Surgeon: Larey Dresser, MD;  Location: [facility] ENDOSCOPY;  Service: Cardiovascular;  Laterality: N/A;  . CESAREAN SECTION      x 3  . KNEE ARTHROSCOPY Right 2007  . TEE WITHOUT CARDIOVERSION N/A 12/01/2020   Procedure: TRANSESOPHAGEAL ECHOCARDIOGRAM (TEE);  Surgeon: Larey Dresser, MD;  Location: [facility] ENDOSCOPY;  Service: Cardiovascular;  Laterality: N/A;  . TOTAL HIP ARTHROPLASTY  Left 06/15/2019   Procedure: LEFT TOTAL HIP ARTHROPLASTY ANTERIOR APPROACH;  Surgeon: Mcarthur Rossetti, MD;  Location: [facility];  Service: Orthopedics;  Laterality: Left;    ROS- all systems are reviewed and negatives except as per HPI above  Current Outpatient Medications  Medication Sig Dispense Refill  . anastrozole (ARIMIDEX) 1 MG tablet Take 1 tablet (1 mg total) by mouth daily. 90 tablet 4  . apixaban (ELIQUIS) 5 MG TABS tablet Take 1 tablet (5 mg total) by mouth 2 (two) times daily. 60 tablet 3  . b complex vitamins capsule Take 1 capsule by mouth daily.    . Cholecalciferol (VITAMIN D3) 125 MCG (5000 UT) TABS Take 5,000 Units by mouth daily.    Marland Kitchen diltiazem (CARDIZEM) 30 MG tablet Take 1 tablet every 4 hours AS NEEDED for heart rate >100 as long as blood pressure >100. 45 tablet 1  . furosemide (LASIX) 40 MG tablet Take 1 tablet (40 mg total) by mouth daily. 90 tablet 3  . ibuprofen (ADVIL,MOTRIN) 200 MG tablet Take 400 mg by mouth every 6 (six) hours as needed for moderate pain.    . metoprolol tartrate (LOPRESSOR) 100 MG tablet Take 1 tablet (100 mg total) by mouth 2 (two) times daily. 180 tablet 3  . Multiple Vitamin tablet Take 2 tablets by mouth daily.    . naproxen (NAPROSYN) 500 MG tablet Take 500 mg by mouth daily.     No current facility-administered medications for this  visit.    Physical Exam: Vitals:   12/10/20 0907  BP: 106/74  Pulse: (!) 116  SpO2: 97%  Weight: 211 lb 6.4 oz (95.9 kg)  Height: 5\' 6"  (1.676 m)    GEN- The patient is well appearing, alert and oriented x 3 today.   Head- normocephalic, atraumatic Eyes-  Sclera clear, conjunctiva pink Ears- hearing intact Oropharynx- clear Lungs-  normal work of breathing Heart- irregular rate and rhythm,  GI- soft,  Extremities- no clubbing, cyanosis, or edema  Wt Readings from Last 3 Encounters:  12/10/20 211 lb 6.4 oz (95.9 kg)  12/05/20 212 lb 6.4 oz (96.3 kg)  12/01/20 215 lb (97.5 kg)    EKG  tracing ordered today is personally reviewed and shows afib, V rates 116 bpm  TEE 12/01/20- EF  25%, moderate to severe LA enlargement  ekg 12/05/20- coarse AF not atrial flutter  Assessment and Plan:  1. Persistent afib chads2vasc score is 1 The patient has symptomatic, recurrent atrial fibrillation.  Chads2vasc score is 1.  she is anticoagulated with eliquis . Therapeutic strategies for afib including medicine (tikosyn, amiodarone) and ablation were discussed in detail with the patient today. Risk, benefits, and alternatives to EP study and radiofrequency ablation for afib were also discussed in detail today. These risks include but are not limited to stroke, bleeding, vascular damage, tamponade, perforation, damage to the esophagus, lungs, and other structures, pulmonary vein stenosis, worsening renal function, and death. The patient understands these risk and wishes to proceed.  We will plan Cardiac CT prior to ablation.  Given obesity, prior XRT, and moderate to severe LA enlargement by TEE, I have advised that her anticipated success with ablation is reduced.  2. OSA Now using CPAP Follows with Dr Maxwell Caul  3. Obesity Body mass index is 34.12 kg/m. Lifestyle modification advised  4. Acute reduction in EF Likely tachycardia mediated Reassess in sinus Increase metoprolol for better rate control today  Thompson Grayer MD, [facility] 12/10/2020 9:48 AM

## 2020-12-29 DIAGNOSIS — G4733 Obstructive sleep apnea (adult) (pediatric): Secondary | ICD-10-CM | POA: Diagnosis not present

## 2021-01-07 ENCOUNTER — Other Ambulatory Visit: Payer: Self-pay

## 2021-01-07 ENCOUNTER — Other Ambulatory Visit: Payer: BC Managed Care – PPO | Admitting: *Deleted

## 2021-01-07 DIAGNOSIS — I519 Heart disease, unspecified: Secondary | ICD-10-CM | POA: Diagnosis not present

## 2021-01-07 DIAGNOSIS — I4891 Unspecified atrial fibrillation: Secondary | ICD-10-CM | POA: Diagnosis not present

## 2021-01-07 DIAGNOSIS — I4819 Other persistent atrial fibrillation: Secondary | ICD-10-CM

## 2021-01-07 DIAGNOSIS — D6869 Other thrombophilia: Secondary | ICD-10-CM | POA: Diagnosis not present

## 2021-01-07 LAB — BASIC METABOLIC PANEL
BUN/Creatinine Ratio: 22 (ref 12–28)
BUN: 21 mg/dL (ref 8–27)
CO2: 22 mmol/L (ref 20–29)
Calcium: 9.8 mg/dL (ref 8.7–10.3)
Chloride: 106 mmol/L (ref 96–106)
Creatinine, Ser: 0.94 mg/dL (ref 0.57–1.00)
Glucose: 107 mg/dL — ABNORMAL HIGH (ref 65–99)
Potassium: 4.5 mmol/L (ref 3.5–5.2)
Sodium: 143 mmol/L (ref 134–144)
eGFR: 68 mL/min/{1.73_m2} (ref >59)

## 2021-01-07 LAB — CBC WITH DIFFERENTIAL/PLATELET
Basophils Absolute: 0.1 10*3/uL (ref 0.0–0.2)
Basos: 1 %
EOS (ABSOLUTE): 0.1 10*3/uL (ref 0.0–0.4)
Eos: 2 %
Hematocrit: 39.3 % (ref 34.0–46.6)
Hemoglobin: 13.6 g/dL (ref 11.1–15.9)
Immature Grans (Abs): 0 10*3/uL (ref 0.0–0.1)
Immature Granulocytes: 0 %
Lymphocytes Absolute: 2.5 10*3/uL (ref 0.7–3.1)
Lymphs: 41 %
MCH: 30.6 pg (ref 26.6–33.0)
MCHC: 34.6 g/dL (ref 31.5–35.7)
MCV: 89 fL (ref 79–97)
Monocytes Absolute: 0.4 10*3/uL (ref 0.1–0.9)
Monocytes: 7 %
Neutrophils Absolute: 3 10*3/uL (ref 1.4–7.0)
Neutrophils: 49 %
Platelets: 161 10*3/uL (ref 150–450)
RBC: 4.44 x10E6/uL (ref 3.77–5.28)
RDW: 13.6 % (ref 11.7–15.4)
WBC: 6.1 10*3/uL (ref 3.4–10.8)

## 2021-01-19 ENCOUNTER — Telehealth (HOSPITAL_COMMUNITY): Payer: Self-pay | Admitting: *Deleted

## 2021-01-19 NOTE — Telephone Encounter (Signed)
Reaching out to patient to offer assistance regarding upcoming cardiac imaging study; pt verbalizes understanding of appt date/time, parking situation and where to check in, pre-test NPO status, and verified current allergies; name and call back number provided for further questions should they arise  Gordy Clement RN Navigator Cardiac Imaging [facility] [phone] office [phone] cell  Pt to take her morning dose of 100mg  metoprolol tartrate and 30mg  cardizem 2 hours prior to cardiac CT scan.

## 2021-01-20 ENCOUNTER — Ambulatory Visit (HOSPITAL_COMMUNITY)
Admission: RE | Admit: 2021-01-20 | Discharge: 2021-01-20 | Disposition: A | Discharge status: Home / Self Care | Payer: BC Managed Care – PPO | Source: Ambulatory Visit | Attending: Internal Medicine | Admitting: Internal Medicine

## 2021-01-20 ENCOUNTER — Other Ambulatory Visit: Payer: Self-pay

## 2021-01-20 DIAGNOSIS — I4891 Unspecified atrial fibrillation: Secondary | ICD-10-CM | POA: Diagnosis not present

## 2021-01-20 MED ORDER — IOHEXOL 350 MG/ML SOLN
80.0000 mL | Freq: Once | INTRAVENOUS | Status: AC | PRN
  Administered 2021-01-20: 80 mL via INTRAVENOUS

## 2021-01-25 IMAGING — MR MR HEAD WO/W CM
12 series · 48 of 48 positions shown · IV contrast (multihance)
Comparison: None.

CLINICAL DATA: Amaurosis fugax.  History breast cancer

EXAM:
MRI HEAD WITHOUT AND WITH CONTRAST
TECHNIQUE: Multiplanar, multiecho pulse sequences of the brain and surrounding
structures were obtained without and with intravenous contrast.
CONTRAST:  20mL MULTIHANCE GADOBENATE DIMEGLUMINE 529 MG/ML IV SOLN

[Series 2: t1_se_sag · sagittal · 5.0mm · 0.45mm/px · 1 of 23 slices shown]
[im 1/23]
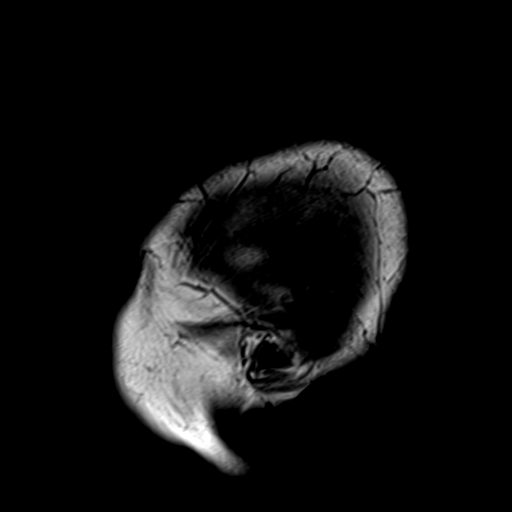

[Series 3: ep2d_diff_3 · axial · 3.0mm · 1.80mm/px · z∈[-41,+107]mm · 6 of 102 slices shown]
[im 1/102]
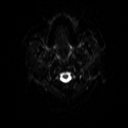
[im 21/102]
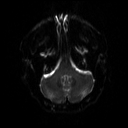
[im 41/102]
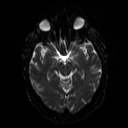
[im 61/102]
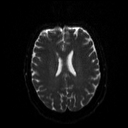
[im 81/102]
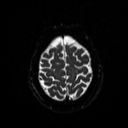
[im 102/102]
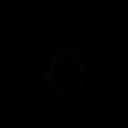

[Series 4: ep2d_diff_3_adc · axial · 3.0mm · 1.80mm/px · z∈[-41,+107]mm · 2 of 51 slices shown]
[im 1/51]
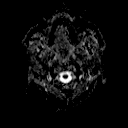
[im 51/51]
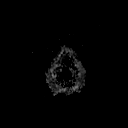

[Series 5: ep2d_diff_cor · coronal · 5.0mm · 1.77mm/px · 4 of 53 slices shown]
[im 1/53]
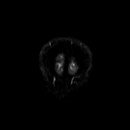
[im 18/53]
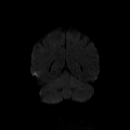
[im 35/53]
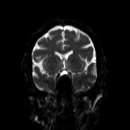
[im 53/53]
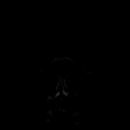

[Series 6: ep2d_diff_cor_adc · coronal · 5.0mm · 1.77mm/px · 2 of 27 slices shown]
[im 1/27]
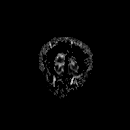
[im 27/27]
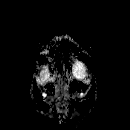

[Series 8: swi_images · axial · 2.0mm · 0.90mm/px · z∈[-36,+104]mm · 5 of 72 slices shown]
[im 1/72]
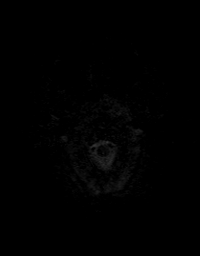
[im 18/72]
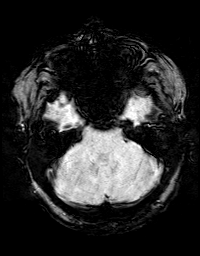
[im 36/72]
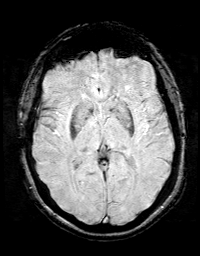
[im 54/72]
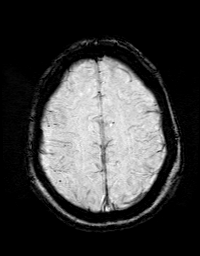
[im 72/72]
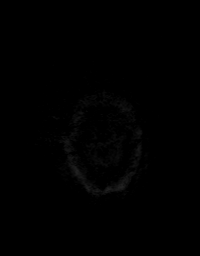

[Series 9: FLAIR · axial · 3.0mm · 0.43mm/px · z∈[-39,+104]mm · 2 of 25 slices shown]
[im 1/25]
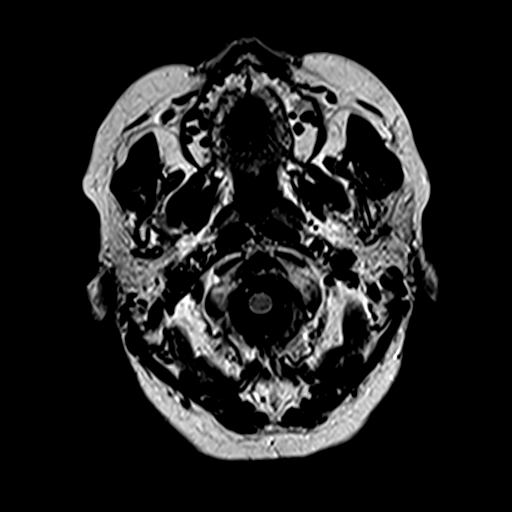
[im 25/25]
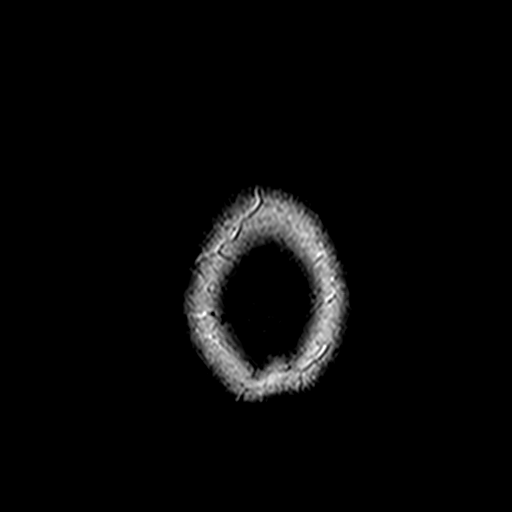

[Series 10: t2_tse_tra_512 · axial · 5.0mm · 0.72mm/px · z∈[-34,+102]mm · 2 of 24 slices shown]
[im 1/24]
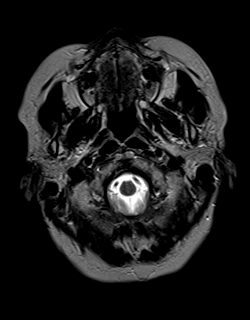
[im 24/24]
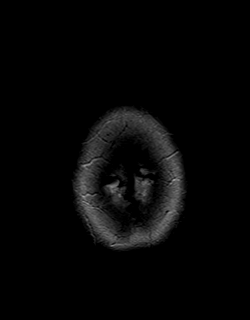

[Series 11: t1_mpr_tra · axial · 1.0mm · 0.72mm/px · z∈[-37,+105]mm · 10 of 144 slices shown]
[im 1/144]
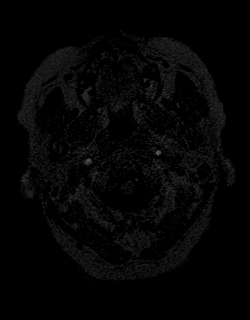
[im 16/144]
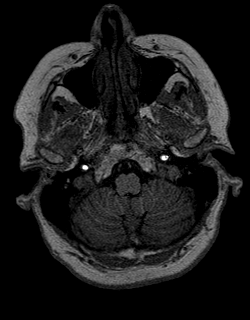
[im 32/144]
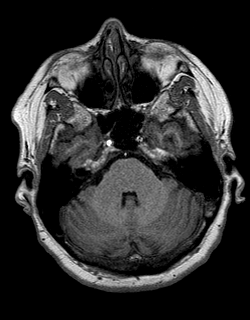
[im 48/144]
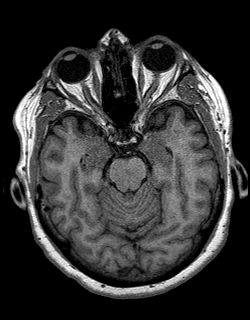
[im 64/144]
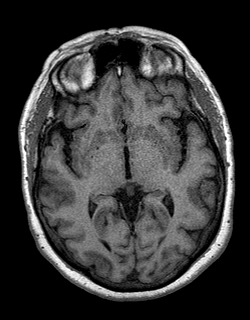
[im 80/144]
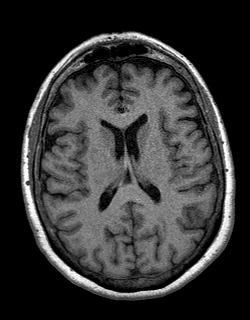
[im 96/144]
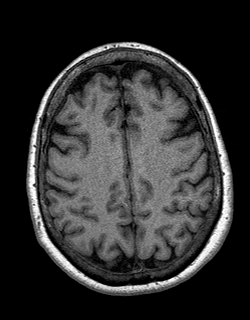
[im 112/144]
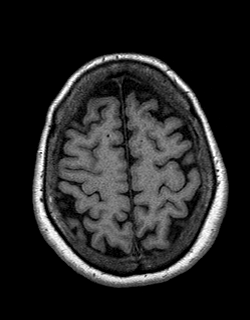
[im 128/144]
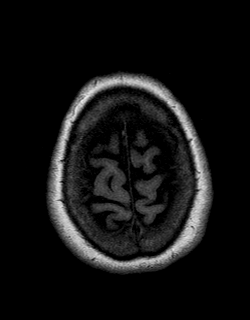
[im 144/144]
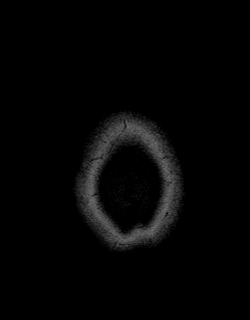

[Series 12: T2 · coronal · 5.0mm · 0.45mm/px · 2 of 28 slices shown]
[im 1/28]
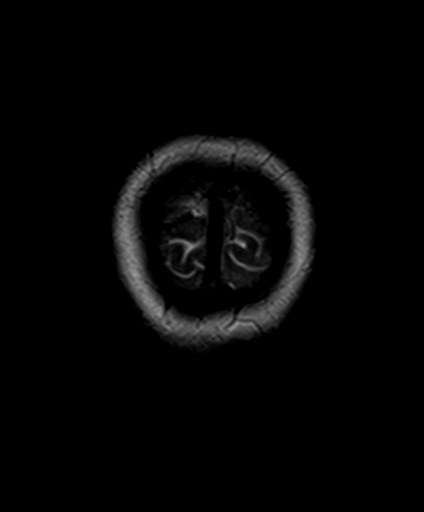
[im 28/28]
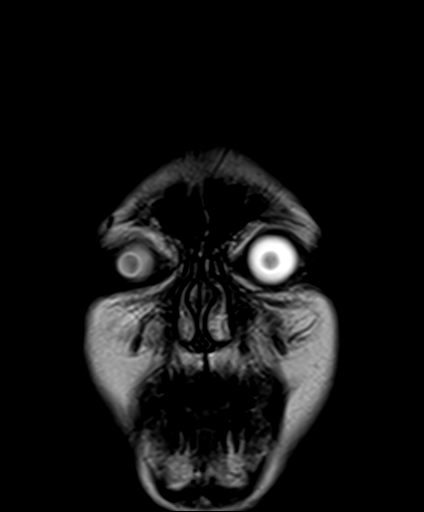

[Series 13: t1_mpr_tra post · axial · 1.0mm · 0.72mm/px · z∈[-37,+105]mm · 10 of 144 slices shown]
[im 1/144]
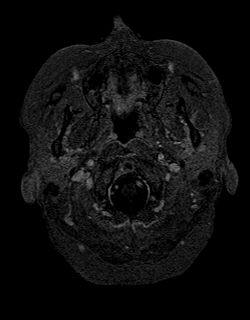
[im 16/144]
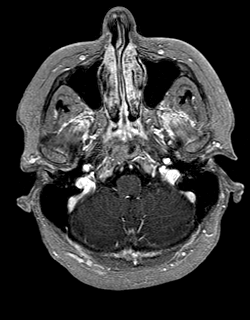
[im 32/144]
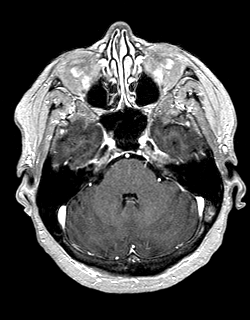
[im 48/144]
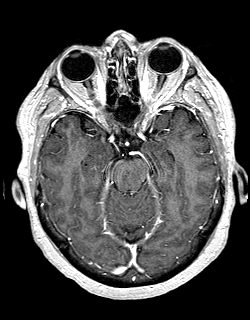
[im 64/144]
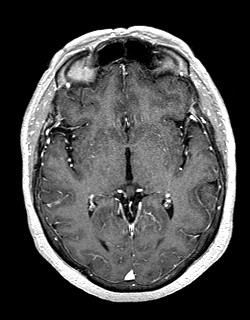
[im 80/144]
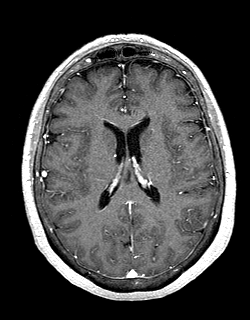
[im 96/144]
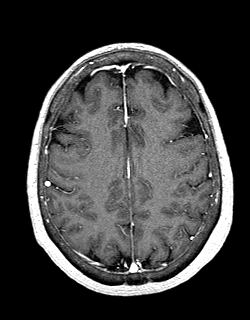
[im 112/144]
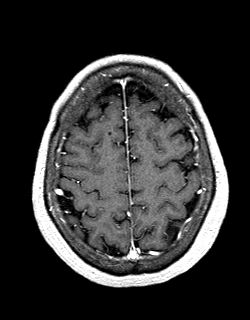
[im 128/144]
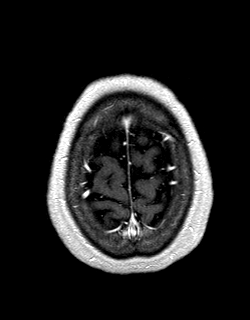
[im 144/144]
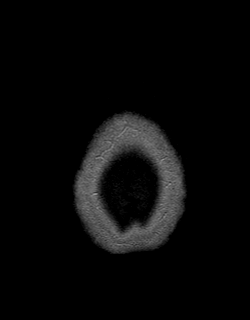

[Series 14: T1 post-contrast · coronal · 5.0mm · 0.72mm/px · 2 of 26 slices shown]
[im 1/26]
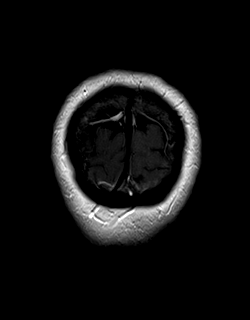
[im 26/26]
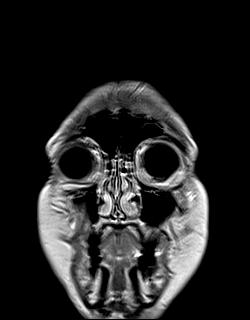

[48 of 48 positions shown; findings below may reference images not displayed]

FINDINGS: Brain: Ventricle size and cerebral volume normal. Minimal white
matter changes. Negative for acute infarct, hemorrhage, mass. Normal
enhancement postcontrast administration. Negative for metastatic
disease.

Vascular: Normal arterial flow voids

Skull and upper cervical spine: No focal skeletal lesion.

Sinuses/Orbits: Paranasal sinuses clear.  Negative orbit

Other: None
IMPRESSION: No acute abnormality. Negative for infarct or metastatic disease.
Minimal white matter changes likely chronic microvascular ischemia.

## 2021-01-26 ENCOUNTER — Other Ambulatory Visit (HOSPITAL_COMMUNITY)
Admission: RE | Admit: 2021-01-26 | Discharge: 2021-01-26 | Disposition: A | Discharge status: Home / Self Care | Payer: BC Managed Care – PPO | Source: Ambulatory Visit | Attending: Internal Medicine | Admitting: Internal Medicine

## 2021-01-26 ENCOUNTER — Telehealth: Payer: Self-pay | Admitting: *Deleted

## 2021-01-26 DIAGNOSIS — U071 COVID-19: Secondary | ICD-10-CM | POA: Diagnosis not present

## 2021-01-26 DIAGNOSIS — I4819 Other persistent atrial fibrillation: Secondary | ICD-10-CM

## 2021-01-26 DIAGNOSIS — Z01812 Encounter for preprocedural laboratory examination: Secondary | ICD-10-CM | POA: Diagnosis not present

## 2021-01-26 LAB — SARS CORONAVIRUS 2 (TAT 6-24 HRS): SARS Coronavirus 2: POSITIVE — AB

## 2021-01-26 NOTE — Telephone Encounter (Signed)
Spoke to the patient and notified her that we have unfortunately had to cancel her procedure tomorrow and I would be contacting her to reschedule after a meeting with Dr. Rayann Heman.  Patient verbalized understanding.

## 2021-01-27 ENCOUNTER — Encounter (HOSPITAL_COMMUNITY): Admission: RE | Payer: Self-pay | Source: Home / Self Care

## 2021-01-27 ENCOUNTER — Ambulatory Visit (HOSPITAL_COMMUNITY)
Admission: RE | Admit: 2021-01-27 | Payer: BC Managed Care – PPO | Source: Home / Self Care | Admitting: Internal Medicine

## 2021-01-27 ENCOUNTER — Telehealth (HOSPITAL_COMMUNITY): Payer: Self-pay

## 2021-01-27 ENCOUNTER — Telehealth: Payer: Self-pay

## 2021-01-27 ENCOUNTER — Ambulatory Visit: Payer: Self-pay | Admitting: *Deleted

## 2021-01-27 DIAGNOSIS — C50512 Malignant neoplasm of lower-outer quadrant of left female breast: Secondary | ICD-10-CM | POA: Diagnosis not present

## 2021-01-27 DIAGNOSIS — U071 COVID-19: Secondary | ICD-10-CM | POA: Diagnosis not present

## 2021-01-27 DIAGNOSIS — I480 Paroxysmal atrial fibrillation: Secondary | ICD-10-CM | POA: Diagnosis not present

## 2021-01-27 DIAGNOSIS — M359 Systemic involvement of connective tissue, unspecified: Secondary | ICD-10-CM | POA: Diagnosis not present

## 2021-01-27 SURGERY — ATRIAL FIBRILLATION ABLATION
Anesthesia: General

## 2021-01-27 NOTE — Telephone Encounter (Signed)
Pt called for advice on COVID quarantine; she tested positive on 01/26/21; her symptoms started on 01/22/21; she has a cough that is improving; recommendations made per nurse triage protocol; the pt verbalized understanding.  Reason for Disposition . [1] HIGH RISK for severe COVID complications (e.g., weak immune system, age > 53 years, obesity with BMI > 25, pregnant, chronic lung disease or other chronic medical condition) AND [2] COVID symptoms (e.g., cough, fever)  (Exceptions: Already seen by PCP and no new or worsening symptoms.)  Answer Assessment - Initial Assessment Questions 1. COVID-19 DIAGNOSIS: "Who made your COVID-19 diagnosis?" "Was it confirmed by a positive lab test or self-test?" If not diagnosed by a doctor (or NP/PA), ask "Are there lots of cases (community spread) where you live?" Note: See public health department website, if unsure.     Tested pre-procedure 01/26/21 2. COVID-19 EXPOSURE: "Was there any known exposure to COVID before the symptoms began?" CDC Definition of close contact: within 6 feet (2 meters) for a total of 15 minutes or more over a 24-hour period.       3. ONSET: "When did the COVID-19 symptoms start?"     01/22/21 4. WORST SYMPTOM: "What is your worst symptom?" (e.g., cough, fever, shortness of breath, muscle aches)     5. COUGH: "Do you have a cough?" If Yes, ask: "How bad is the cough?"     Yes, mild 6. FEVER: "Do you have a fever?" If Yes, ask: "What is your temperature, how was it measured, and when did it start?"      7. RESPIRATORY STATUS: "Describe your breathing?" (e.g., shortness of breath, wheezing, unable to speak)   8. BETTER-SAME-WORSE: "Are you getting better, staying the same or getting worse compared to yesterday?"  If getting worse, ask, "In what way?"  better 9. HIGH RISK DISEASE: "Do you have any chronic medical problems?" (e.g., asthma, heart or lung disease, weak immune system, obesity, etc.)     Obesity, a-fib 10. VACCINE: "Have you  had the COVID-19 vaccine?" If Yes, ask: "Which one, how many shots, when did you get it?"        11. BOOSTER: "Have you received your COVID-19 booster?" If Yes, ask: "Which one and when did you get it?"        12. PREGNANCY: "Is there any chance you are pregnant?" "When was your last menstrual period?"    13. OTHER SYMPTOMS: "Do you have any other symptoms?"  (e.g., chills, fatigue, headache, loss of smell or taste, muscle pain, sore throat)       14. O2 SATURATION MONITOR:  "Do you use an oxygen saturation monitor (pulse oximeter) at home?" If Yes, ask "What is your reading (oxygen level) today?" "What is your usual oxygen saturation reading?" (e.g., 95%)  Protocols used: CORONAVIRUS (COVID-19) DIAGNOSED OR SUSPECTED-A-AH

## 2021-01-27 NOTE — Telephone Encounter (Signed)
Called to discuss with patient about COVID-19 symptoms and the use of one of the available treatments for those with mild to moderate Covid symptoms and at a high risk of hospitalization.  Pt appears to qualify for outpatient treatment due to co-morbid conditions and/or a member of an at-risk group in accordance with the FDA Emergency Use Authorization.    Symptom onset: 01/22/21 Cough Vaccinated: No Booster? No Immunocompromised? No Qualifiers: Obesity NIH Criteria: Tier 1  Pt. States she has an antiviral medication already.  Marcello Moores

## 2021-01-27 NOTE — Telephone Encounter (Signed)
01/27/21 - surgeon notified of patient's positive [phone] lab results on 01/26/21. MBM

## 2021-02-02 ENCOUNTER — Telehealth: Payer: Self-pay | Admitting: Internal Medicine

## 2021-02-02 NOTE — Telephone Encounter (Signed)
Pre op labs ordered and scheduled with patient.  Verbalized understanding.

## 2021-02-02 NOTE — Telephone Encounter (Signed)
Reschedule Afib ablation to June 21 at 1:30 pm.  Patient agreeable and verbalized understanding.

## 2021-02-02 NOTE — Telephone Encounter (Signed)
Patient has an ablation scheduled for 06/21, she is going on vacation shortly after the procedure and wants to know if she can get in the water.

## 2021-02-02 NOTE — Telephone Encounter (Signed)
Already on the phone with patient when the call came in. Patient made me aware. Will check with Dr. Rayann Heman on Wednesday and get back to her.

## 2021-02-04 NOTE — Telephone Encounter (Signed)
Advised patient per Dr. Rayann Heman she can get in the water 5 days post ablation. Patient verbalized understanding.

## 2021-02-06 ENCOUNTER — Encounter: Payer: Self-pay | Admitting: Oncology

## 2021-02-11 ENCOUNTER — Other Ambulatory Visit: Payer: BC Managed Care – PPO

## 2021-02-11 ENCOUNTER — Other Ambulatory Visit: Payer: Self-pay

## 2021-02-11 DIAGNOSIS — I4819 Other persistent atrial fibrillation: Secondary | ICD-10-CM | POA: Diagnosis not present

## 2021-02-12 LAB — BASIC METABOLIC PANEL
BUN/Creatinine Ratio: 22 (ref 12–28)
BUN: 16 mg/dL (ref 8–27)
CO2: 24 mmol/L (ref 20–29)
Calcium: 9.8 mg/dL (ref 8.7–10.3)
Chloride: 103 mmol/L (ref 96–106)
Creatinine, Ser: 0.73 mg/dL (ref 0.57–1.00)
Glucose: 87 mg/dL (ref 65–99)
Potassium: 4.1 mmol/L (ref 3.5–5.2)
Sodium: 141 mmol/L (ref 134–144)
eGFR: 92 mL/min/{1.73_m2} (ref >59)

## 2021-02-12 LAB — CBC WITH DIFFERENTIAL/PLATELET
Basophils Absolute: 0.1 10*3/uL (ref 0.0–0.2)
Basos: 1 %
EOS (ABSOLUTE): 0.1 10*3/uL (ref 0.0–0.4)
Eos: 1 %
Hematocrit: 39.9 % (ref 34.0–46.6)
Hemoglobin: 13.4 g/dL (ref 11.1–15.9)
Immature Grans (Abs): 0 10*3/uL (ref 0.0–0.1)
Immature Granulocytes: 0 %
Lymphocytes Absolute: 2.8 10*3/uL (ref 0.7–3.1)
Lymphs: 36 %
MCH: 29.6 pg (ref 26.6–33.0)
MCHC: 33.6 g/dL (ref 31.5–35.7)
MCV: 88 fL (ref 79–97)
Monocytes Absolute: 0.6 10*3/uL (ref 0.1–0.9)
Monocytes: 8 %
Neutrophils Absolute: 4.3 10*3/uL (ref 1.4–7.0)
Neutrophils: 54 %
Platelets: 241 10*3/uL (ref 150–450)
RBC: 4.52 x10E6/uL (ref 3.77–5.28)
RDW: 13.1 % (ref 11.7–15.4)
WBC: 7.9 10*3/uL (ref 3.4–10.8)

## 2021-02-25 ENCOUNTER — Ambulatory Visit (HOSPITAL_COMMUNITY): Payer: BC Managed Care – PPO | Admitting: Nurse Practitioner

## 2021-03-02 NOTE — Pre-Procedure Instructions (Signed)
Attempted to call patient regarding procedure instructions for tomorrow.  Left voice mail on the the following items:  Instructed patient on the following items: Arrival time 1130 Nothing to eat or drink after midnight No meds AM of procedure Responsible person to drive you home and stay with you for 24 hrs  Have you missed any doses of anti-coagulant Eliquis- don't missed any doses, don't take dose in the am

## 2021-03-03 ENCOUNTER — Other Ambulatory Visit: Payer: Self-pay

## 2021-03-03 ENCOUNTER — Ambulatory Visit (HOSPITAL_COMMUNITY)
Admission: RE | Admit: 2021-03-03 | Discharge: 2021-03-03 | Disposition: A | Discharge status: Home / Self Care | Payer: BC Managed Care – PPO | Attending: Internal Medicine | Admitting: Internal Medicine

## 2021-03-03 ENCOUNTER — Encounter (HOSPITAL_COMMUNITY)
Admission: RE | Disposition: A | Discharge status: Home / Self Care | Payer: Self-pay | Source: Home / Self Care | Attending: Internal Medicine

## 2021-03-03 ENCOUNTER — Encounter (HOSPITAL_COMMUNITY): Payer: Self-pay | Admitting: Internal Medicine

## 2021-03-03 ENCOUNTER — Ambulatory Visit (HOSPITAL_COMMUNITY): Payer: BC Managed Care – PPO | Admitting: Certified Registered"

## 2021-03-03 DIAGNOSIS — Z791 Long term (current) use of non-steroidal anti-inflammatories (NSAID): Secondary | ICD-10-CM | POA: Diagnosis not present

## 2021-03-03 DIAGNOSIS — Z90710 Acquired absence of both cervix and uterus: Secondary | ICD-10-CM | POA: Insufficient documentation

## 2021-03-03 DIAGNOSIS — C50512 Malignant neoplasm of lower-outer quadrant of left female breast: Secondary | ICD-10-CM | POA: Diagnosis not present

## 2021-03-03 DIAGNOSIS — Z79899 Other long term (current) drug therapy: Secondary | ICD-10-CM | POA: Insufficient documentation

## 2021-03-03 DIAGNOSIS — Z96642 Presence of left artificial hip joint: Secondary | ICD-10-CM | POA: Diagnosis not present

## 2021-03-03 DIAGNOSIS — I4819 Other persistent atrial fibrillation: Secondary | ICD-10-CM | POA: Diagnosis not present

## 2021-03-03 DIAGNOSIS — Z853 Personal history of malignant neoplasm of breast: Secondary | ICD-10-CM | POA: Insufficient documentation

## 2021-03-03 DIAGNOSIS — I480 Paroxysmal atrial fibrillation: Secondary | ICD-10-CM | POA: Diagnosis not present

## 2021-03-03 DIAGNOSIS — Z7901 Long term (current) use of anticoagulants: Secondary | ICD-10-CM | POA: Diagnosis not present

## 2021-03-03 DIAGNOSIS — G4733 Obstructive sleep apnea (adult) (pediatric): Secondary | ICD-10-CM | POA: Diagnosis not present

## 2021-03-03 DIAGNOSIS — Z9221 Personal history of antineoplastic chemotherapy: Secondary | ICD-10-CM | POA: Insufficient documentation

## 2021-03-03 DIAGNOSIS — M1612 Unilateral primary osteoarthritis, left hip: Secondary | ICD-10-CM | POA: Diagnosis not present

## 2021-03-03 HISTORY — PX: ATRIAL FIBRILLATION ABLATION: EP1191

## 2021-03-03 LAB — POCT ACTIVATED CLOTTING TIME
Activated Clotting Time: 277 seconds
Activated Clotting Time: 300 seconds

## 2021-03-03 SURGERY — ATRIAL FIBRILLATION ABLATION
Anesthesia: General

## 2021-03-03 MED ORDER — PANTOPRAZOLE SODIUM 40 MG PO TBEC: 40.0000 mg | DELAYED_RELEASE_TABLET | Freq: Every day | ORAL | 0 refills | Status: DC

## 2021-03-03 MED ORDER — HEPARIN SODIUM (PORCINE) 1000 UNIT/ML IJ SOLN
INTRAMUSCULAR | Status: DC | PRN
  Administered 2021-03-03: 3000 [IU] via INTRAVENOUS

## 2021-03-03 MED ORDER — LIDOCAINE 2% (20 MG/ML) 5 ML SYRINGE
INTRAMUSCULAR | Status: DC | PRN
  Administered 2021-03-03: 40 mg via INTRAVENOUS

## 2021-03-03 MED ORDER — ACETAMINOPHEN 325 MG PO TABS: 650.0000 mg | ORAL_TABLET | ORAL | Status: DC | PRN

## 2021-03-03 MED ORDER — APIXABAN 5 MG PO TABS
5.0000 mg | ORAL_TABLET | Freq: Once | ORAL | Status: AC
  Administered 2021-03-03: 5 mg via ORAL
  Filled 2021-03-03: qty 1

## 2021-03-03 MED ORDER — PROTAMINE SULFATE 10 MG/ML IV SOLN
INTRAVENOUS | Status: DC | PRN
  Administered 2021-03-03: 40 mg via INTRAVENOUS

## 2021-03-03 MED ORDER — SUGAMMADEX SODIUM 200 MG/2ML IV SOLN
INTRAVENOUS | Status: DC | PRN
  Administered 2021-03-03: 200 mg via INTRAVENOUS

## 2021-03-03 MED ORDER — HEPARIN (PORCINE) IN NACL 1000-0.9 UT/500ML-% IV SOLN
INTRAVENOUS | Status: DC | PRN
  Administered 2021-03-03: 500 mL

## 2021-03-03 MED ORDER — SODIUM CHLORIDE 0.9% FLUSH: 3.0000 mL | INTRAVENOUS | Status: DC | PRN

## 2021-03-03 MED ORDER — ROCURONIUM BROMIDE 100 MG/10ML IV SOLN
INTRAVENOUS | Status: DC | PRN
  Administered 2021-03-03: 60 mg via INTRAVENOUS
  Administered 2021-03-03: 10 mg via INTRAVENOUS

## 2021-03-03 MED ORDER — HYDROCODONE-ACETAMINOPHEN 5-325 MG PO TABS: 1.0000 | ORAL_TABLET | ORAL | Status: DC | PRN

## 2021-03-03 MED ORDER — FENTANYL CITRATE (PF) 100 MCG/2ML IJ SOLN
INTRAMUSCULAR | Status: DC | PRN
  Administered 2021-03-03: 50 ug via INTRAVENOUS

## 2021-03-03 MED ORDER — SODIUM CHLORIDE 0.9% FLUSH: 3.0000 mL | Freq: Two times a day (BID) | INTRAVENOUS | Status: DC

## 2021-03-03 MED ORDER — DEXAMETHASONE SODIUM PHOSPHATE 10 MG/ML IJ SOLN
INTRAMUSCULAR | Status: DC | PRN
  Administered 2021-03-03: 10 mg via INTRAVENOUS

## 2021-03-03 MED ORDER — ONDANSETRON HCL 4 MG/2ML IJ SOLN: 4.0000 mg | Freq: Four times a day (QID) | INTRAMUSCULAR | Status: DC | PRN

## 2021-03-03 MED ORDER — MIDAZOLAM HCL 5 MG/5ML IJ SOLN
INTRAMUSCULAR | Status: DC | PRN
  Administered 2021-03-03: 1 mg via INTRAVENOUS

## 2021-03-03 MED ORDER — HEPARIN SODIUM (PORCINE) 1000 UNIT/ML IJ SOLN
INTRAMUSCULAR | Status: DC | PRN
  Administered 2021-03-03: 15000 [IU] via INTRAVENOUS

## 2021-03-03 MED ORDER — ONDANSETRON HCL 4 MG/2ML IJ SOLN
INTRAMUSCULAR | Status: DC | PRN
  Administered 2021-03-03: 4 mg via INTRAVENOUS

## 2021-03-03 MED ORDER — PROPOFOL 10 MG/ML IV BOLUS
INTRAVENOUS | Status: DC | PRN
  Administered 2021-03-03: 60 mg via INTRAVENOUS

## 2021-03-03 MED ORDER — HEPARIN SODIUM (PORCINE) 1000 UNIT/ML IJ SOLN
INTRAMUSCULAR | Status: AC
  Filled 2021-03-03: qty 2

## 2021-03-03 MED ORDER — SODIUM CHLORIDE 0.9 % IV SOLN: 250.0000 mL | INTRAVENOUS | Status: DC | PRN

## 2021-03-03 MED ORDER — PHENYLEPHRINE 40 MCG/ML (10ML) SYRINGE FOR IV PUSH (FOR BLOOD PRESSURE SUPPORT)
PREFILLED_SYRINGE | INTRAVENOUS | Status: DC | PRN
  Administered 2021-03-03 (×2): 120 ug via INTRAVENOUS
  Administered 2021-03-03: 40 ug via INTRAVENOUS
  Administered 2021-03-03 (×3): 80 ug via INTRAVENOUS

## 2021-03-03 MED ORDER — SODIUM CHLORIDE 0.9 % IV SOLN: INTRAVENOUS | Status: DC

## 2021-03-03 SURGICAL SUPPLY — 18 items
BLANKET WARM UNDERBOD FULL ACC (MISCELLANEOUS) ×3 IMPLANT
CATH SMTCH THERMOCOOL SF DF (CATHETERS) ×1 IMPLANT
CATH SOUNDSTAR ECO 8FR (CATHETERS) ×1 IMPLANT
CATH WEBSTER BI DIR CS D-F CRV (CATHETERS) ×1 IMPLANT
CLOSURE PERCLOSE PROSTYLE (VASCULAR PRODUCTS) ×3 IMPLANT
COVER SWIFTLINK CONNECTOR (BAG) ×2 IMPLANT
MAT PREVALON FULL STRYKER (MISCELLANEOUS) ×1 IMPLANT
NDL BAYLIS TRANSSEPTAL 71CM (NEEDLE) IMPLANT
NEEDLE BAYLIS TRANSSEPTAL 71CM (NEEDLE) ×2 IMPLANT
PACK EP LATEX FREE (CUSTOM PROCEDURE TRAY) ×2
PACK EP LF (CUSTOM PROCEDURE TRAY) ×1 IMPLANT
PAD PRO RADIOLUCENT 2001M-C (PAD) ×2 IMPLANT
PATCH CARTO3 (PAD) ×1 IMPLANT
SHEATH PINNACLE 7F 10CM (SHEATH) ×3 IMPLANT
SHEATH PINNACLE 9F 10CM (SHEATH) ×1 IMPLANT
SHEATH PROBE COVER 6X72 (BAG) ×1 IMPLANT
SHEATH SWARTZ TS SL2 63CM 8.5F (SHEATH) ×1 IMPLANT
TUBING SMART ABLATE COOLFLOW (TUBING) ×2 IMPLANT

## 2021-03-03 NOTE — Anesthesia Postprocedure Evaluation (Signed)
Anesthesia Post Note  Patient: Linda Foster  Procedure(s) Performed: ATRIAL FIBRILLATION ABLATION     Patient location during evaluation: PACU Anesthesia Type: General Level of consciousness: awake and alert Pain management: pain level controlled Vital Signs Assessment: post-procedure vital signs reviewed and stable Respiratory status: spontaneous breathing, nonlabored ventilation, respiratory function stable and patient connected to nasal cannula oxygen Cardiovascular status: blood pressure returned to baseline and stable Postop Assessment: no apparent nausea or vomiting Anesthetic complications: no   No notable events documented.  Last Vitals:  Vitals:   03/03/21 1600 03/03/21 1615  BP: 110/63 108/71  Pulse: 72 73  Resp: 14 15  Temp:    SpO2: 100% 100%    Last Pain:  Vitals:   03/03/21 1555  TempSrc:   PainSc: 0-No pain                 Effie Berkshire

## 2021-03-03 NOTE — H&P (Signed)
Primary EP: Dr Wallis Bamberg Linda Foster is a 65 y.o. female who presents today for routine electrophysiology study and ablation for afib.  Since last being seen in our clinic, the patient reports doing reasonably well.  She has had worsening afib.  + fatigue and decreased exercise tolerance.  + reduction in EF.  she had covid in May but has recovered.   Today, she denies symptoms of palpitations, chest pain, shortness of breath,  lower extremity edema, dizziness, presyncope, or syncope.  The patient is otherwise without complaint today.       Past Medical History:  Diagnosis Date   Breast cancer ([place]) 2008    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   Breast cancer ([place]) 2019    left ER/PR +,  XRT and chemotherapy   Family history of adverse reaction to anesthesia      N&V   Family history of prostate cancer     Lymphedema 2013   OSA (obstructive sleep apnea)      uses a dental appliance   Osteoarthritis     Persistent atrial fibrillation ([place])      History of Afib   PONV (postoperative nausea and vomiting)           Past Surgical History:  Procedure Laterality Date   ABDOMINAL HYSTERECTOMY       BREAST LUMPECTOMY Left 2008   BREAST LUMPECTOMY WITH RADIOACTIVE SEED LOCALIZATION Left 12/05/2017    Procedure: BREAST LUMPECTOMY WITH RADIOACTIVE SEED LOCALIZATION;  Surgeon: Rolm Bookbinder, MD;  Location: [place];  Service: General;  Laterality: Left;   BREAST RECONSTRUCTION Left 2019    no BP on Lt arm   CARDIOVERSION N/A 12/01/2020    Procedure: CARDIOVERSION;  Surgeon: Larey Dresser, MD;  Location: [facility];  Service: Cardiovascular;  Laterality: N/A;   CESAREAN SECTION         x 3   KNEE ARTHROSCOPY Right 2007   TEE WITHOUT CARDIOVERSION N/A 12/01/2020    Procedure: TRANSESOPHAGEAL ECHOCARDIOGRAM (TEE);  Surgeon: Larey Dresser, MD;  Location: [facility] ENDOSCOPY;  Service: Cardiovascular;  Laterality: N/A;   TOTAL HIP ARTHROPLASTY Left 06/15/2019    Procedure: LEFT  TOTAL HIP ARTHROPLASTY ANTERIOR APPROACH;  Surgeon: Mcarthur Rossetti, MD;  Location: [facility];  Service: Orthopedics;  Laterality: Left;      ROS- all systems are reviewed and negatives except as per HPI above         Current Outpatient Medications  Medication Sig Dispense Refill   anastrozole (ARIMIDEX) 1 MG tablet Take 1 tablet (1 mg total) by mouth daily. 90 tablet 4   apixaban (ELIQUIS) 5 MG TABS tablet Take 1 tablet (5 mg total) by mouth 2 (two) times daily. 60 tablet 3   b complex vitamins capsule Take 1 capsule by mouth daily.       Cholecalciferol (VITAMIN D3) 125 MCG (5000 UT) TABS Take 5,000 Units by mouth daily.       diltiazem (CARDIZEM) 30 MG tablet Take 1 tablet every 4 hours AS NEEDED for heart rate >100 as long as blood pressure >100. 45 tablet 1   furosemide (LASIX) 40 MG tablet Take 1 tablet (40 mg total) by mouth daily. 90 tablet 3   ibuprofen (ADVIL,MOTRIN) 200 MG tablet Take 400 mg by mouth every 6 (six) hours as needed for moderate pain.       metoprolol tartrate (LOPRESSOR) 100 MG tablet Take 1 tablet (100 mg total) by mouth 2 (two) times  daily. 180 tablet 3   Multiple Vitamin tablet Take 2 tablets by mouth daily.       naproxen (NAPROSYN) 500 MG tablet Take 500 mg by mouth daily.        No current facility-administered medications for this visit.      Physical Exam: Vitals:   03/03/21 1208  BP: (!) 137/91  Pulse: 96  Resp: 18  Temp: 98.3 F (36.8 C)  SpO2: 100%      GEN- The patient is well appearing, alert and oriented x 3 today.   Head- normocephalic, atraumatic Eyes-  Sclera clear, conjunctiva pink Ears- hearing intact Oropharynx- clear Lungs-  normal work of breathing Heart- irregular rate and rhythm, GI- soft, Extremities- no clubbing, cyanosis, or edema     Assessment and Plan:   1. Persistent afib chads2vasc score is 1 The patient has symptomatic, recurrent atrial fibrillation.   Risk, benefits, and alternatives to EP study and  radiofrequency ablation for afib were again discussed in detail today. These risks include but are not limited to stroke, bleeding, vascular damage, tamponade, perforation, damage to the esophagus, lungs, and other structures, pulmonary vein stenosis, worsening renal function, and death. The patient understands these risk and wishes to proceed.    Cardiac CT reviewed at length with the patient today.  she reports compliance with [place] without interruption.  Thompson Grayer MD, [place] 03/03/2021 12:24 PM

## 2021-03-03 NOTE — Anesthesia Procedure Notes (Signed)
Procedure Name: Intubation Date/Time: 03/03/2021 12:53 PM Performed by: Gwyndolyn Saxon, CRNA Pre-anesthesia Checklist: Patient identified, Emergency Drugs available, Suction available and Patient being monitored Patient Re-evaluated:Patient Re-evaluated prior to induction Oxygen Delivery Method: Circle system utilized Preoxygenation: Pre-oxygenation with 100% oxygen Induction Type: IV induction Ventilation: Mask ventilation without difficulty Laryngoscope Size: Miller and 2 Grade View: Grade II Tube type: Oral Tube size: 6.5 mm Number of attempts: 1 Airway Equipment and Method: Patient positioned with wedge pillow and Stylet Placement Confirmation: ETT inserted through vocal cords under direct vision, positive ETCO2 and breath sounds checked- equal and bilateral Secured at: 21 cm Tube secured with: Tape Dental Injury: Teeth and Oropharynx as per pre-operative assessment

## 2021-03-03 NOTE — Discharge Instructions (Addendum)
Post procedure care instructions No driving for 4 days. No lifting over 5 lbs for 1 week. No vigorous or sexual activity for 1 week. You may return to work/your usual activities on 03/11/21. Keep procedure site clean & dry. If you notice increased pain, swelling, bleeding or pus, call/return!  You may shower after 24 hours, but no soaking in baths/hot tubs/pools for 1 week.    You have an appointment set up with the [facility].  Multiple studies have shown that being followed by a dedicated atrial fibrillation clinic in addition to the standard care you receive from your other physicians improves health. We believe that enrollment in the atrial fibrillation clinic will allow [place] to better care for you.   The phone number to the [facility] is [phone]. The clinic is staffed Monday through Friday from 8:30am to 5pm.  Parking Directions: The clinic is located in the Heart and Vascular Building connected to [facility] 1)From [address] turn on to [address] and go to the 3rd entrance  (Heart and Vascular entrance) on the right. 2)Look to the right for Heart &Vascular Parking Garage. 3)A code for the entrance is required, for July is 3342.   4)Take the elevators to the 1st floor. Registration is in the room with the glass walls at the end of the hallway.  If you have any trouble parking or locating the clinic, please don't hesitate to call [phone].   Cardiac Ablation, Care After  This sheet gives you information about how to care for yourself after your procedure. Your health care provider may also give you more specific instructions. If you have problems or questions, contact your health care provider. What can I expect after the procedure? After the procedure, it is common to have: Bruising around your puncture site. Tenderness around your puncture site. Skipped heartbeats. Tiredness (fatigue).  Follow these instructions at  home: Puncture site care  Follow instructions from your health care provider about how to take care of your puncture site. Make sure you: If present, leave stitches (sutures), skin glue, or adhesive strips in place. These skin closures may need to stay in place for up to 2 weeks. If adhesive strip edges start to loosen and curl up, you may trim the loose edges. Do not remove adhesive strips completely unless your health care provider tells you to do that. If a large square bandage is present, this may be removed 24 hours after surgery.  Check your puncture site every day for signs of infection. Check for: Redness, swelling, or pain. Fluid or blood. If your puncture site starts to bleed, lie down on your back, apply firm pressure to the area, and contact your health care provider. Warmth. Pus or a bad smell. Driving Do not drive for at least 4 days after your procedure or however long your health care provider recommends. (Do not resume driving if you have previously been instructed not to drive for other health reasons.) Do not drive or use heavy machinery while taking prescription pain medicine. Activity Avoid activities that take a lot of effort for at least 7 days after your procedure. Do not lift anything that is heavier than 5 lb (4.5 kg) for one week.  No sexual activity for 1 week.  Return to your normal activities as told by your health care provider. Ask your health care provider what activities are safe for you. General instructions Take over-the-counter and prescription medicines only as told by your health care  provider. Do not use any products that contain nicotine or tobacco, such as cigarettes and e-cigarettes. If you need help quitting, ask your health care provider. You may shower after 24 hours, but Do not take baths, swim, or use a hot tub for 1 week.  Do not drink alcohol for 24 hours after your procedure. Keep all follow-up visits as told by your health care provider. This  is important. Contact a health care provider if: You have redness, mild swelling, or pain around your puncture site. You have fluid or blood coming from your puncture site that stops after applying firm pressure to the area. Your puncture site feels warm to the touch. You have pus or a bad smell coming from your puncture site. You have a fever. You have chest pain or discomfort that spreads to your neck, jaw, or arm. You are sweating a lot. You feel nauseous. You have a fast or irregular heartbeat. You have shortness of breath. You are dizzy or light-headed and feel the need to lie down. You have pain or numbness in the arm or leg closest to your puncture site. Get help right away if: Your puncture site suddenly swells. Your puncture site is bleeding and the bleeding does not stop after applying firm pressure to the area. These symptoms may represent a serious problem that is an emergency. Do not wait to see if the symptoms will go away. Get medical help right away. Call your local emergency services (911 in the [place] Do not drive yourself to the hospital. Summary After the procedure, it is normal to have bruising and tenderness at the puncture site in your groin, neck, or forearm. Check your puncture site every day for signs of infection. Get help right away if your puncture site is bleeding and the bleeding does not stop after applying firm pressure to the area. This is a medical emergency. This information is not intended to replace advice given to you by your health care provider. Make sure you discuss any questions you have with your health care provider.

## 2021-03-03 NOTE — Anesthesia Preprocedure Evaluation (Addendum)
Anesthesia Evaluation  Patient identified by MRN, date of birth, ID band Patient awake    Reviewed: Allergy & Precautions, NPO status , Patient's Chart, lab work & pertinent test results  History of Anesthesia Complications (+) PONV and history of anesthetic complications  Airway Mallampati: II  TM Distance: >3 FB Neck ROM: Full    Dental  (+) Teeth Intact, Dental Advisory Given   Pulmonary sleep apnea ,    Pulmonary exam normal        Cardiovascular + dysrhythmias Atrial Fibrillation  Rhythm:Irregular Rate:Normal     Neuro/Psych negative psych ROS   GI/Hepatic negative GI ROS, Neg liver ROS,   Endo/Other  negative endocrine ROS  Renal/GU negative Renal ROS     Musculoskeletal  (+) Arthritis ,   Abdominal Normal abdominal exam  (+)   Peds  Hematology negative hematology ROS (+)   Anesthesia Other Findings   Reproductive/Obstetrics                            Anesthesia Physical Anesthesia Plan  ASA: 3  Anesthesia Plan: General   Post-op Pain Management:    Induction: Intravenous  PONV Risk Score and Plan: 3 and Ondansetron, Dexamethasone and Midazolam  Airway Management Planned: Oral ETT  Additional Equipment: None  Intra-op Plan:   Post-operative Plan: Extubation in OR  Informed Consent: I have reviewed the patients History and Physical, chart, labs and discussed the procedure including the risks, benefits and alternatives for the proposed anesthesia with the patient or authorized representative who has indicated his/her understanding and acceptance.     Dental advisory given  Plan Discussed with: CRNA  Anesthesia Plan Comments:         Anesthesia Quick Evaluation

## 2021-03-03 NOTE — Transfer of Care (Signed)
Immediate Anesthesia Transfer of Care Note  Patient: Linda Foster  Procedure(s) Performed: ATRIAL FIBRILLATION ABLATION  Patient Location: PACU  Anesthesia Type:General  Level of Consciousness: awake, alert  and oriented  Airway & Oxygen Therapy: Patient Spontanous Breathing  Post-op Assessment: Report given to RN and Post -op Vital signs reviewed and stable  Post vital signs: Reviewed and stable  Last Vitals:  Vitals Value Taken Time  BP    Temp    Pulse    Resp    SpO2      Last Pain:  Vitals:   03/03/21 1221  TempSrc:   PainSc: 0-No pain         Complications: No notable events documented.

## 2021-03-03 NOTE — Progress Notes (Signed)
Pt ambulated without difficulty or bleeding.   Discharged home with her husband who will drive and stay with pt x 24 hrs. 

## 2021-03-04 ENCOUNTER — Encounter (HOSPITAL_COMMUNITY): Payer: Self-pay | Admitting: Internal Medicine

## 2021-03-05 ENCOUNTER — Other Ambulatory Visit (HOSPITAL_COMMUNITY): Payer: Self-pay | Admitting: *Deleted

## 2021-03-05 MED ORDER — METOPROLOL TARTRATE 100 MG PO TABS: 100.0000 mg | ORAL_TABLET | Freq: Two times a day (BID) | ORAL | 3 refills | Status: DC

## 2021-03-31 ENCOUNTER — Ambulatory Visit (HOSPITAL_COMMUNITY)
Admission: RE | Admit: 2021-03-31 | Discharge: 2021-03-31 | Disposition: A | Discharge status: Home / Self Care | Payer: BC Managed Care – PPO | Source: Ambulatory Visit | Attending: Nurse Practitioner | Admitting: Nurse Practitioner

## 2021-03-31 ENCOUNTER — Encounter (HOSPITAL_COMMUNITY): Payer: Self-pay | Admitting: Nurse Practitioner

## 2021-03-31 ENCOUNTER — Other Ambulatory Visit: Payer: Self-pay

## 2021-03-31 VITALS — BP 132/80 | HR 73 | Ht 66.0 in | Wt 211.2 lb

## 2021-03-31 DIAGNOSIS — Z79899 Other long term (current) drug therapy: Secondary | ICD-10-CM | POA: Insufficient documentation

## 2021-03-31 DIAGNOSIS — Z7901 Long term (current) use of anticoagulants: Secondary | ICD-10-CM | POA: Diagnosis not present

## 2021-03-31 DIAGNOSIS — Z8249 Family history of ischemic heart disease and other diseases of the circulatory system: Secondary | ICD-10-CM | POA: Insufficient documentation

## 2021-03-31 DIAGNOSIS — I4819 Other persistent atrial fibrillation: Secondary | ICD-10-CM | POA: Diagnosis not present

## 2021-03-31 DIAGNOSIS — I480 Paroxysmal atrial fibrillation: Secondary | ICD-10-CM | POA: Insufficient documentation

## 2021-03-31 DIAGNOSIS — G4733 Obstructive sleep apnea (adult) (pediatric): Secondary | ICD-10-CM | POA: Diagnosis not present

## 2021-03-31 DIAGNOSIS — D6869 Other thrombophilia: Secondary | ICD-10-CM

## 2021-03-31 NOTE — Progress Notes (Signed)
Primary Care Physician: Carol Ada, MD Referring Physician: Dr. Rayann Heman( previous Dr. Caryl Comes)   Linda Foster is a 65 y.o. female with a h/o paroxysmal afib that was seen  in the afib clinic early fall  for afib being found at time of preop for hip surgery .  BB was restarted and she was in SR today when seen in the clinic.  She went on to have her hip surgery and did well without any issues with afib.  She has felt a few flutters since  then but short lived. Not on anticoagulation with CHA2DS2VASc score of 1 for female.  F/u in afib clinic, 05/28/20. Pt is here as she had irregular heart beat that started Tuesday pm after returning from  the beach. She considered taking extra metoprolol but did not. She is not on anticoagulation as she has a CHA2DS2VASc score of 1. She feels better today and EKG shows SR. No alcohol, heavy caffeine use, no tobacco use.   Follow up in the AF clinic 11/25/20. Patient reports that around 3:30 AM today, she felt much more SOB and she checked her heart rate which was around 160 bpm. There were no specific triggers that she could identify. She is not currently on anticoagulation.   F/u in afib clnic, 12/05/20. She  had a successful cardioversion  3/21 but returned to afib this am. HR was 150 bpm before her meds this am now atrial flutter at 125 bpm. She is currently on 75 mg metoprolol bid. We discussed options going forward to manage afib. She continues on eliquis 5 mg bid for a CHA2DS2VASc score of 1.   F/u in afib clinic, 7/19. She is one month s/p ablation and has not noted any afib. No swallowing or groin issues. Continues to feel slightly winded.   Today, she denies symptoms of chest pain, orthopnea, PND, lower extremity edema, dizziness, presyncope, syncope, or neurologic sequela. The patient is tolerating medications without difficulties and is otherwise without complaint today.   Past Medical History:  Diagnosis Date   Breast cancer ([place]) 2008   left triple  neg breast cancer   Breast cancer ([place]) 2019   left ER/PR +,  XRT and chemotherapy   Family history of adverse reaction to anesthesia    N&V   Family history of prostate cancer    Lymphedema 2013   OSA (obstructive sleep apnea)    uses a dental appliance   Osteoarthritis    Persistent atrial fibrillation ([place])    History of Afib   PONV (postoperative nausea and vomiting)    Past Surgical History:  Procedure Laterality Date   ABDOMINAL HYSTERECTOMY     ATRIAL FIBRILLATION ABLATION N/A 03/03/2021   Procedure: ATRIAL FIBRILLATION ABLATION;  Surgeon: Thompson Grayer, MD;  Location: [facility];  Service: Cardiovascular;  Laterality: N/A;   BREAST LUMPECTOMY Left 2008   BREAST LUMPECTOMY WITH RADIOACTIVE SEED LOCALIZATION Left 12/05/2017   Procedure: BREAST LUMPECTOMY WITH RADIOACTIVE SEED LOCALIZATION;  Surgeon: Rolm Bookbinder, MD;  Location: [facility];  Service: General;  Laterality: Left;   BREAST RECONSTRUCTION Left 2019   no BP on Lt arm   CARDIOVERSION N/A 12/01/2020   Procedure: CARDIOVERSION;  Surgeon: Larey Dresser, MD;  Location: [facility]COPY;  Service: Cardiovascular;  Laterality: N/A;   CESAREAN SECTION      x 3   KNEE ARTHROSCOPY Right 2007   TEE WITHOUT CARDIOVERSION N/A 12/01/2020   Procedure: TRANSESOPHAGEAL ECHOCARDIOGRAM (TEE);  Surgeon: Aundra Dubin,  Elby Showers, MD;  Location: [facility]OSCOPY;  Service: Cardiovascular;  Laterality: N/A;   TOTAL HIP ARTHROPLASTY Left 06/15/2019   Procedure: LEFT TOTAL HIP ARTHROPLASTY ANTERIOR APPROACH;  Surgeon: Mcarthur Rossetti, MD;  [facility]: [facility];  Service: Orthopedics;  Laterality: Left;    Current Outpatient Medications  Medication Sig Dispense Refill   anastrozole (ARIMIDEX) 1 MG tablet Take 1 tablet (1 mg total) by mouth daily. 90 tablet 4   apixaban (ELIQUIS) 5 MG TABS tablet Take 1 tablet (5 mg total) by mouth 2 (two) times daily. 60 tablet 3   APPLE CIDER VINEGAR PO Take 2-3 tablets by mouth daily.      b complex vitamins capsule Take 1 capsule by mouth daily.     cholecalciferol (VITAMIN D3) 25 MCG (1000 UNIT) tablet Take 1,000 Units by mouth daily.     diltiazem (CARDIZEM) 30 MG tablet Take 1 tablet every 4 hours AS NEEDED for heart rate >100 as long as blood pressure >100. (Patient taking differently: Take 30 mg by mouth every 4 (four) hours as needed (heart rate >100 as long as blood pressure >100.).) 45 tablet 1   furosemide (LASIX) 40 MG tablet Take 1 tablet (40 mg total) by mouth daily. 90 tablet 3   Glucosamine HCl (GLUCOSAMINE PO) Take 2 tablets by mouth every morning.     ibuprofen (ADVIL,MOTRIN) 200 MG tablet Take 600 mg by mouth as needed for moderate pain.     metoprolol tartrate (LOPRESSOR) 100 MG tablet Take 1 tablet (100 mg total) by mouth 2 (two) times daily. 180 tablet 3   Multiple Vitamin tablet Take 2 tablets by mouth daily.     naproxen (NAPROSYN) 500 MG tablet Take 500 mg by mouth daily.     No current facility-administered medications for this encounter.    No Known Allergies  Social History   Socioeconomic History   Marital status: Married    Spouse name: Not on file   Number of children: Not on file   Years of education: Not on file   Highest education level: Not on file  Occupational History   Not on file  Tobacco Use   Smoking status: Never   Smokeless tobacco: Never  Vaping Use   Vaping Use: Never used  Substance and Sexual Activity   Alcohol use: No   Drug use: No   Sexual activity: Not on file  Other Topics Concern   Not on file  Social History Narrative   Not on file   Social Determinants of Health   Financial Resource Strain: Not on file  Food Insecurity: Not on file  Transportation Needs: Not on file  Physical Activity: Not on file  Stress: Not on file  Social Connections: Not on file  Intimate Partner Violence: Not on file    Family History  Problem Relation Age of Onset   Hypertension Mother    COPD Father    Prostate cancer  Father 29   Lung cancer Brother 22   Prostate cancer Brother 48   Cancer Cousin        type unk dx. >50   Cancer Cousin 42       type unk   Cancer Cousin        type and age dx unkown    ROS- All systems are reviewed and negative except as per the HPI above  Physical Exam: Vitals:   03/31/21 0921  BP: 132/80  Pulse: 73  Weight: 95.8 kg  Height: 5'  6" (1.676 m)   Wt Readings from Last 3 Encounters:  03/31/21 95.8 kg  03/03/21 95.3 kg  12/10/20 95.9 kg    Labs: Lab Results  Component Value Date   NA 141 02/11/2021   K 4.1 02/11/2021   CL 103 02/11/2021   CO2 24 02/11/2021   GLUCOSE 87 02/11/2021   BUN 16 02/11/2021   CREATININE 0.73 02/11/2021   CALCIUM 9.8 02/11/2021   MG 2.2 11/02/2016   Lab Results  Component Value Date   INR 1.02 11/03/2016   Lab Results  Component Value Date   CHOL 232 (H) 11/03/2016   HDL 74 11/03/2016   LDLCALC 145 (H) 11/03/2016   TRIG 63 11/03/2016    GEN- The patient is a well appearing obese female, alert and oriented x 3 today.   HEENT-head normocephalic, atraumatic, sclera clear, conjunctiva pink, hearing intact, trachea midline. Lungs- Clear to ausculation bilaterally, normal work of breathing Heart-regular rate and rhythm, tachycardia, no murmurs, rubs or gallops  GI- soft, NT, ND, + BS Extremities- no clubbing, cyanosis, or edema MS- no significant deformity or atrophy Skin- no rash or lesion Psych- euthymic mood, full affect Neuro- strength and sensation are intact  EKG- NSR  HR 73 bpm, QRS 82,  QTc 440 ms  Echo 07/04/19 1. Left ventricular ejection fraction, by visual estimation, is 55%. The  left ventricle has normal function. Normal left ventricular size. There is  no left ventricular hypertrophy. Normal wall motion. GLS -19.9%. Normal  diastolic function.   2. Global right ventricle has normal systolic function.The right  ventricular size is normal. No increase in right ventricular wall  thickness.   3. Left  atrial size was mildly dilated.   4. Right atrial size was normal.   5. The mitral valve is normal in structure. Trace mitral valve  regurgitation. No evidence of mitral stenosis.   6. The tricuspid valve is normal in structure. Tricuspid valve  regurgitation is trivial.   7. The aortic valve is tricuspid Aortic valve regurgitation was not  visualized by color flow Doppler. Mild aortic valve sclerosis without  stenosis.   8. The inferior vena cava is dilated in size with >50% respiratory  variability, suggesting right atrial pressure of 8 mmHg.   9. The tricuspid regurgitant velocity is 2.62 m/s, and with an assumed  right atrial pressure of 8 mmHg, the estimated right ventricular systolic  pressure is mildly elevated at 35.5 mmHg.   Epic records reviewed   Assessment and Plan: 1. Paroxysmal afib S/p ablation x one month   Doing well staying in SR   Continue increased metoprolol to 100 mg BID  2.Chadsvasc score of 1 Continues  on eliquis 5 mg bid reminded to not miss any doses for the 2 more month ablation recovery time   3. OSA Cpap Followed by Dr. Maxwell Caul   4. Dyspnea TEE showed LV dysfunction with EF 25-30% prior to ablation Will need repeat echo after several months staying in SR   F/u with Dr. Rayann Heman 06/01/21  Geroge Baseman. Elona Yinger, [facility] [address] [place], [place] [address] [phone]

## 2021-04-09 ENCOUNTER — Other Ambulatory Visit: Payer: Self-pay | Admitting: Internal Medicine

## 2021-04-10 ENCOUNTER — Telehealth: Payer: Self-pay

## 2021-04-10 MED ORDER — APIXABAN 5 MG PO TABS: 5.0000 mg | ORAL_TABLET | Freq: Two times a day (BID) | ORAL | 5 refills | Status: DC

## 2021-04-10 NOTE — Telephone Encounter (Signed)
Prescription refill request for Eliquis received.  Indication: afib  Last office visit: [facility], 12/10/2020 Scr: 0.73, 02/11/2021 Age: 65 yo  Weight: 95.8 kg   Pt is on the correct dose of Eliquis per dosing criteria, prescription refill sent for Eliquis '5mg'$  BID.

## 2021-04-27 ENCOUNTER — Other Ambulatory Visit (HOSPITAL_COMMUNITY): Payer: Self-pay

## 2021-04-27 ENCOUNTER — Ambulatory Visit: Payer: BC Managed Care – PPO | Admitting: Internal Medicine

## 2021-04-27 MED ORDER — APIXABAN 5 MG PO TABS
5.0000 mg | ORAL_TABLET | Freq: Two times a day (BID) | ORAL | 0 refills | Status: DC
Start: 2021-04-27 — End: 2021-04-28

## 2021-04-28 ENCOUNTER — Other Ambulatory Visit (HOSPITAL_COMMUNITY): Payer: Self-pay

## 2021-04-28 MED ORDER — APIXABAN 5 MG PO TABS: 5.0000 mg | ORAL_TABLET | Freq: Two times a day (BID) | ORAL | 0 refills | Status: DC

## 2021-06-01 ENCOUNTER — Other Ambulatory Visit: Payer: Self-pay

## 2021-06-01 ENCOUNTER — Ambulatory Visit (INDEPENDENT_AMBULATORY_CARE_PROVIDER_SITE_OTHER): Payer: BC Managed Care – PPO | Admitting: Internal Medicine

## 2021-06-01 VITALS — BP 124/78 | HR 75 | Ht 65.5 in | Wt 211.0 lb

## 2021-06-01 DIAGNOSIS — G4733 Obstructive sleep apnea (adult) (pediatric): Secondary | ICD-10-CM | POA: Diagnosis not present

## 2021-06-01 DIAGNOSIS — I4819 Other persistent atrial fibrillation: Secondary | ICD-10-CM

## 2021-06-01 MED ORDER — METOPROLOL TARTRATE 50 MG PO TABS: 50.0000 mg | ORAL_TABLET | Freq: Two times a day (BID) | ORAL | 3 refills | Status: DC

## 2021-06-01 NOTE — Progress Notes (Signed)
PCP: Carol Ada, MD   Linda Foster is a 65 y.o. female who presents today for routine electrophysiology followup.  Since his recent afib ablation, the patient reports doing very well.  she denies procedure related complications and is pleased with the results of the procedure.  Today, she denies symptoms of palpitations, chest pain, shortness of breath,  lower extremity edema, dizziness, presyncope, or syncope.  The patient is otherwise without complaint today.   Past Medical History:  Diagnosis Date   Breast cancer ([place]) 2008   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   Breast cancer ([place]) 2019   left ER/PR +,  XRT and chemotherapy   Family history of adverse reaction to anesthesia    N&V   Family history of prostate cancer    Lymphedema 2013   OSA (obstructive sleep apnea)    uses a dental appliance   Osteoarthritis    Persistent atrial fibrillation ([place])    History of Afib   PONV (postoperative nausea and vomiting)    Past Surgical History:  Procedure Laterality Date   ABDOMINAL HYSTERECTOMY     ATRIAL FIBRILLATION ABLATION N/A 03/03/2021   Procedure: ATRIAL FIBRILLATION ABLATION;  Surgeon: Thompson Grayer, MD;  Location: [facility];  Service: Cardiovascular;  Laterality: N/A;   BREAST LUMPECTOMY Left 2008   BREAST LUMPECTOMY WITH RADIOACTIVE SEED LOCALIZATION Left 12/05/2017   Procedure: BREAST LUMPECTOMY WITH RADIOACTIVE SEED LOCALIZATION;  Surgeon: Rolm Bookbinder, MD;  Location: [facility];  Service: General;  Laterality: Left;   BREAST RECONSTRUCTION Left 2019   no BP on Lt arm   CARDIOVERSION N/A 12/01/2020   Procedure: CARDIOVERSION;  Surgeon: Larey Dresser, MD;  Location: [facility] ENDOSCOPY;  Service: Cardiovascular;  Laterality: N/A;   CESAREAN SECTION      x 3   KNEE ARTHROSCOPY Right 2007   TEE WITHOUT CARDIOVERSION N/A 12/01/2020   Procedure: TRANSESOPHAGEAL ECHOCARDIOGRAM (TEE);  Surgeon: Larey Dresser, MD;  Location: [facility] ENDOSCOPY;  Service:  Cardiovascular;  Laterality: N/A;   TOTAL HIP ARTHROPLASTY Left 06/15/2019   Procedure: LEFT TOTAL HIP ARTHROPLASTY ANTERIOR APPROACH;  Surgeon: Mcarthur Rossetti, MD;  [facility];  Service: Orthopedics;  Laterality: Left;    ROS- all systems are personally reviewed and negatives except as per HPI above  Current Outpatient Medications  Medication Sig Dispense Refill   anastrozole (ARIMIDEX) 1 MG tablet Take 1 tablet (1 mg total) by mouth daily. 90 tablet 4   apixaban (ELIQUIS) 5 MG TABS tablet Take 1 tablet (5 mg total) by mouth 2 (two) times daily. 28 tablet 0   APPLE CIDER VINEGAR PO Take 2-3 tablets by mouth daily.     b complex vitamins capsule Take 1 capsule by mouth daily.     cholecalciferol (VITAMIN D3) 25 MCG (1000 UNIT) tablet Take 1,000 Units by mouth daily.     diltiazem (CARDIZEM) 30 MG tablet Take 1 tablet every 4 hours AS NEEDED for heart rate >100 as long as blood pressure >100. (Patient taking differently: Take 30 mg by mouth every 4 (four) hours as needed (heart rate >100 as long as blood pressure >100.).) 45 tablet 1   furosemide (LASIX) 40 MG tablet Take 1 tablet (40 mg total) by mouth daily. 90 tablet 3   Glucosamine HCl (GLUCOSAMINE PO) Take 2 tablets by mouth every morning.     ibuprofen (ADVIL,MOTRIN) 200 MG tablet Take 600 mg by mouth as needed for moderate pain.     metoprolol tartrate (  LOPRESSOR) 100 MG tablet Take 1 tablet (100 mg total) by mouth 2 (two) times daily. 180 tablet 3   Multiple Vitamin tablet Take 2 tablets by mouth daily.     naproxen (NAPROSYN) 500 MG tablet Take 500 mg by mouth daily.     No current facility-administered medications for this visit.    Physical Exam: Vitals:   06/01/21 0952  BP: 124/78  Pulse: 75  SpO2: 97%  Weight: 211 lb (95.7 kg)  Height: 5' 5.5" (1.664 m)    GEN- The patient is well appearing, alert and oriented x 3 today.   Head- normocephalic, atraumatic Eyes-  Sclera clear, conjunctiva pink Ears-  hearing intact Oropharynx- clear Lungs- Clear to ausculation bilaterally, normal work of breathing Heart- Regular rate and rhythm, no murmurs, rubs or gallops, PMI not laterally displaced GI- soft, NT, ND, + BS Extremities- no clubbing, cyanosis, or edema  TEE 12/01/20- EF 25%  EKG tracing ordered today is personally reviewed and shows sinus  Assessment and Plan:  1. 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  We may consider stopping if EF recovers (though she will be 65 in December).  We could consider React AF trial. reduce metoprolol to '50mg'$  BID  2. OSA Not currently using CPAP Follows with Dr Maxwell Caul I have advised that she reach out to his office to try to find a more comfortable fit so that she can use.  She has previously tried dental appliances  3. Obesity Body mass index is 34.58 kg/m. Lifesytle modification is advised  4. 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

## 2021-06-01 NOTE — Patient Instructions (Addendum)
Medication Instructions:  Reduce Metoprolol Tartrate to 50 mg two times daily.  Your physician recommends that you continue on your current medications as directed. Please refer to the Current Medication list given to you today.  Labwork: None ordered.  Testing/Procedures: please schedule appt a few days post echo.  Your physician has requested that you have an echocardiogram. Echocardiography is a painless test that uses sound waves to create images of your heart. It provides your doctor with information about the size and shape of your heart and how well your heart's chambers and valves are working. This procedure takes approximately one hour. There are no restrictions for this procedure.   Follow-Up: Your physician wants you to follow-up in: 3 months with Thompson Grayer, MD   Any Other Special Instructions Will Be Listed Below (If Applicable).  If you need a refill on your cardiac medications before your next appointment, please call your pharmacy.

## 2021-06-17 ENCOUNTER — Inpatient Hospital Stay: Payer: BC Managed Care – PPO | Attending: Oncology | Admitting: Oncology

## 2021-06-17 ENCOUNTER — Inpatient Hospital Stay: Payer: BC Managed Care – PPO

## 2021-06-17 ENCOUNTER — Other Ambulatory Visit: Payer: Self-pay

## 2021-06-17 VITALS — BP 126/63 | HR 73 | Temp 97.5°F | Resp 18 | Wt 213.0 lb

## 2021-06-17 DIAGNOSIS — Z923 Personal history of irradiation: Secondary | ICD-10-CM | POA: Insufficient documentation

## 2021-06-17 DIAGNOSIS — Z853 Personal history of malignant neoplasm of breast: Secondary | ICD-10-CM | POA: Diagnosis not present

## 2021-06-17 DIAGNOSIS — C50912 Malignant neoplasm of unspecified site of left female breast: Secondary | ICD-10-CM | POA: Diagnosis not present

## 2021-06-17 DIAGNOSIS — Z9012 Acquired absence of left breast and nipple: Secondary | ICD-10-CM | POA: Diagnosis not present

## 2021-06-17 DIAGNOSIS — D0512 Intraductal carcinoma in situ of left breast: Secondary | ICD-10-CM

## 2021-06-17 DIAGNOSIS — C50512 Malignant neoplasm of lower-outer quadrant of left female breast: Secondary | ICD-10-CM

## 2021-06-17 DIAGNOSIS — Z9221 Personal history of antineoplastic chemotherapy: Secondary | ICD-10-CM | POA: Insufficient documentation

## 2021-06-17 DIAGNOSIS — Z79811 Long term (current) use of aromatase inhibitors: Secondary | ICD-10-CM | POA: Diagnosis not present

## 2021-06-17 LAB — COMPREHENSIVE METABOLIC PANEL
ALT: 20 U/L (ref 0–44)
AST: 24 U/L (ref 15–41)
Albumin: 4.4 g/dL (ref 3.5–5.0)
Alkaline Phosphatase: 111 U/L (ref 38–126)
Anion gap: 11 (ref 5–15)
BUN: 22 mg/dL (ref 8–23)
CO2: 28 mmol/L (ref 22–32)
Calcium: 10.1 mg/dL (ref 8.9–10.3)
Chloride: 104 mmol/L (ref 98–111)
Creatinine, Ser: 0.83 mg/dL (ref 0.44–1.00)
GFR, Estimated: >60 mL/min (ref >60)
Glucose, Bld: 103 mg/dL — ABNORMAL HIGH (ref 70–99)
Potassium: 4.4 mmol/L (ref 3.5–5.1)
Sodium: 143 mmol/L (ref 135–145)
Total Bilirubin: 0.8 mg/dL (ref 0.3–1.2)
Total Protein: 7.6 g/dL (ref 6.5–8.1)

## 2021-06-17 LAB — CBC WITH DIFFERENTIAL/PLATELET
Abs Immature Granulocytes: 0.01 10*3/uL (ref 0.00–0.07)
Basophils Absolute: 0 10*3/uL (ref 0.0–0.1)
Basophils Relative: 1 %
Eosinophils Absolute: 0.1 10*3/uL (ref 0.0–0.5)
Eosinophils Relative: 2 %
HCT: 38.6 % (ref 36.0–46.0)
Hemoglobin: 13.3 g/dL (ref 12.0–15.0)
Immature Granulocytes: 0 %
Lymphocytes Relative: 41 %
Lymphs Abs: 2.2 10*3/uL (ref 0.7–4.0)
MCH: 30.4 pg (ref 26.0–34.0)
MCHC: 34.5 g/dL (ref 30.0–36.0)
MCV: 88.1 fL (ref 80.0–100.0)
Monocytes Absolute: 0.4 10*3/uL (ref 0.1–1.0)
Monocytes Relative: 7 %
Neutro Abs: 2.7 10*3/uL (ref 1.7–7.7)
Neutrophils Relative %: 49 %
Platelets: 200 10*3/uL (ref 150–400)
RBC: 4.38 MIL/uL (ref 3.87–5.11)
RDW: 13.3 % (ref 11.5–15.5)
WBC: 5.5 10*3/uL (ref 4.0–10.5)
nRBC: 0 % (ref 0.0–0.2)

## 2021-06-17 NOTE — Progress Notes (Signed)
[facility]  [phone] [phone]     ID: CASSIDY TASHIRO DOB: 06/22/1956  MR#: [id number]  GYK#:[id number]  Patient Care Team: Carol Ada, MD as PCP - General (Family Medicine) Cloee Compton, MD as Consulting Physician (Obstetrics and Gynecology) Marlou Sa Tonna Corner, MD as Consulting Physician (Orthopedic Surgery) Rolm Bookbinder, MD as Consulting Physician (General Surgery) Carissa Musick, Virgie Dad, MD as Consulting Physician (Oncology) Irene Limbo, MD as Consulting Physician (Plastic Surgery) Jacinto Reap, MD (Plastic Surgery) Belva Crome, MD as Consulting Physician (Cardiology) Belva Crome, MD as Consulting Physician (Cardiology) Mcarthur Rossetti, MD as Consulting Physician (Orthopedic Surgery) OTHER MD:   CHIEF COMPLAINT: 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    She continues on anastrozole.  Hot flashes are not a major issue neither is arthralgias or myalgias.  However she does have significant vaginal dryness problems.  She has tried some lubricants which have been partly helpful.  Her most recent bone density screening from 10/16/2018 at [facility] showed a T-score of -1.4.  Since her last visit, she underwent bilateral diagnostic mammography with tomography at [facility] on 06/16/2020 showing: breast density category C; no evidence of malignancy in either breast.    REVIEW OF SYSTEMS:  Her daughter is a hairdresser and her husband both had Covid. They're recovering well. Paul exercises by walking. She does not do this regularly though she says.  For exercise she walks about twice a week but says her knees keep her from walking more than perhaps 1600 steps at a time A detailed review of systems today was otherwise stable.  COVID: Moderna vaccine x2 , no  boosters as of October 2022  HISTORY OF CURRENT ILLNESS: From the original intake note:  LEVY WELLMAN had routine screening mammography on 10/03/2017 showing a possible abnormality in the left breast. She underwent unilateral left breast diagnostic mammography with tomography at [facility] on 10/13/2017 showing: breast density category C. There are new grouped calcifications in the left breast lower outer quadrant and middle depth. Ultrasound of the left axilla on 10/20/2017 showed no abnormalities.   Accordingly on 10/20/2017 she proceeded to biopsy of the left breast area in question. The pathology from this procedure showed ([id number]): Ductal carcinoma in situ, intermediate grade. The largest focus measures 4 mm. The carcinoma is cribriform type with necrosis and associated microcalcifications. Prognostic indicators significant for: estrogen receptor, 95% positive and progesterone receptor, 80% positive, both with strong staining intensity.  Accordingly on 12/05/2017 she proceeded to left lumpectomy.  The pathology [id number]) showed invasive ductal carcinoma, grade 1, measuring 0.3 cm, in addition to low-grade ductal carcinoma in situ.  Both the invasive and in situ components were focally less than a millimeter from the inferior margin.  Note that Marlynn has a history of prior breast cancer, status post left lumpectomy for what seems to have been a T2 N3 invasive ductal carcinoma, estrogen and progesterone receptor negative.  She was treated with chemotherapy followed by radiation.  Accordingly the patient is not a candidate for repeat radiation and the feasibility or value of sentinel lymph node sampling is questionable.  Shelise has been advised by Dr. Donne Hazel that mastectomy is standard in this situation and she has met with Dr. Iran Planas to consider reconstruction options.  However the patient is interested in exploring the possibility of lumpectomy.  Her subsequent history is as  detailed  below.   PAST MEDICAL HISTORY: Past Medical History:  Diagnosis Date   Breast cancer ([place]) 2008   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   Breast cancer ([place]) 2019   left ER/PR +,  XRT and chemotherapy   Family history of adverse reaction to anesthesia    N&V   Family history of prostate cancer    Lymphedema 2013   OSA (obstructive sleep apnea)    uses a dental appliance   Osteoarthritis    Persistent atrial fibrillation ([place])    History of Afib   PONV (postoperative nausea and vomiting)     PAST SURGICAL HISTORY: Past Surgical History:  Procedure Laterality Date   ABDOMINAL HYSTERECTOMY     ATRIAL FIBRILLATION ABLATION N/A 03/03/2021   Procedure: ATRIAL FIBRILLATION ABLATION;  Surgeon: Thompson Grayer, MD;  Location: [facility];  Service: Cardiovascular;  Laterality: N/A;   BREAST LUMPECTOMY Left 2008   BREAST LUMPECTOMY WITH RADIOACTIVE SEED LOCALIZATION Left 12/05/2017   Procedure: BREAST LUMPECTOMY WITH RADIOACTIVE SEED LOCALIZATION;  Surgeon: Rolm Bookbinder, MD;  Location: [facility];  Service: General;  Laterality: Left;   BREAST RECONSTRUCTION Left 2019   no BP on Lt arm   CARDIOVERSION N/A 12/01/2020   Procedure: CARDIOVERSION;  Surgeon: Larey Dresser, MD;  Location: [facility] ENDOSCOPY;  Service: Cardiovascular;  Laterality: N/A;   CESAREAN SECTION      x 3   KNEE ARTHROSCOPY Right 2007   TEE WITHOUT CARDIOVERSION N/A 12/01/2020   Procedure: TRANSESOPHAGEAL ECHOCARDIOGRAM (TEE);  Surgeon: Larey Dresser, MD;  Location: [facility]COPY;  Service: Cardiovascular;  Laterality: N/A;   TOTAL HIP ARTHROPLASTY Left 06/15/2019   Procedure: LEFT TOTAL HIP ARTHROPLASTY ANTERIOR APPROACH;  Surgeon: Mcarthur Rossetti, MD;  [facility];  Service: Orthopedics;  Laterality: Left;  She had an episode of  A Fib in 2018. She was being seen by Dr. Oran Rein about 10 years ago.    FAMILY HISTORY Family History  Problem Relation Age of Onset   Hypertension Mother     COPD Father    Prostate cancer Father 32   Lung cancer Brother 46   Prostate cancer Brother 64   Cancer Cousin        type unk dx. >50   Cancer Cousin 42       type unk   Cancer Cousin        type and age dx unkown  Very detailed family history is available in the genetics counseling note from 11/14/2017.  The patient has been genetically tested and no pathogenic mutation was identified (see below).   GYNECOLOGIC HISTORY:  Menarche: 65 years old Age at first live birth: 65 years old The patient is GXP3. She is s/p hysterectomy and USO in 2007. She did not receive HRT. She took oral contraceptives for more than a year remotely with no complications.   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 Konrad Dolores, her husband of 59 years, owns [employer]. The patient's daughter, Loma Sousa, is an Psychologist, prison and probation services at [employer]. The patient's daughter, Jerene Pitch, is a Probation officer with her own business. The patient's son, Lurena Joiner, works with Konrad Dolores at [employer], but he also has a degree in Therapist, sports. The patient has 7 grandchildren. She attends [employer].    ADVANCED DIRECTIVES: In the absence of any documents to the contrary the patient's husband is her healthcare power of attorney   HEALTH MAINTENANCE:  Social History   Tobacco Use   Smoking status: Never   Smokeless tobacco: Never  Vaping Use   Vaping Use: Never used  Substance Use Topics   Alcohol use: No   Drug use: No     Colonoscopy: 10 years ago/ [facility]   PAP:  Bone density: January 2012/ normal    No Known Allergies  Current Outpatient Medications  Medication Sig Dispense Refill   anastrozole (ARIMIDEX) 1 MG tablet Take 1 tablet (1 mg total) by mouth daily. 90 tablet 4   apixaban (ELIQUIS) 5 MG TABS tablet Take 1 tablet (5 mg total) by mouth 2 (two) times daily. 28 tablet 0   APPLE CIDER VINEGAR PO Take 2-3 tablets by mouth daily.     b complex vitamins  capsule Take 1 capsule by mouth daily.     cholecalciferol (VITAMIN D3) 25 MCG (1000 UNIT) tablet Take 1,000 Units by mouth daily.     diltiazem (CARDIZEM) 30 MG tablet Take 1 tablet every 4 hours AS NEEDED for heart rate >100 as long as blood pressure >100. (Patient taking differently: Take 30 mg by mouth every 4 (four) hours as needed (heart rate >100 as long as blood pressure >100.).) 45 tablet 1   furosemide (LASIX) 40 MG tablet Take 1 tablet (40 mg total) by mouth daily. 90 tablet 3   Glucosamine HCl (GLUCOSAMINE PO) Take 2 tablets by mouth every morning.     ibuprofen (ADVIL,MOTRIN) 200 MG tablet Take 600 mg by mouth as needed for moderate pain.     metoprolol tartrate (LOPRESSOR) 50 MG tablet Take 1 tablet (50 mg total) by mouth 2 (two) times daily. 180 tablet 3   Multiple Vitamin tablet Take 2 tablets by mouth daily.     naproxen (NAPROSYN) 500 MG tablet Take 500 mg by mouth daily.     No current facility-administered medications for this visit.    OBJECTIVE: white woman who appears stated  Vitals:   06/17/21 1205  BP: 126/63  Pulse: 73  Resp: 18  Temp: (!) 97.5 F (36.4 C)  SpO2: 100%      Body mass index is 34.91 kg/m.   Wt Readings from Last 3 Encounters:  06/17/21 213 lb (96.6 kg)  06/01/21 211 lb (95.7 kg)  03/31/21 211 lb 3.2 oz (95.8 kg)      ECOG FS:1 - Symptomatic but completely ambulatory  Sclerae unicteric, EOMs intact Wearing a mask No cervical or supraclavicular adenopathy Lungs no rales or rhonchi Heart regular rate and rhythm Abd soft, nontender, positive bowel sounds MSK no focal spinal tenderness, no upper extremity lymphedema Neuro: nonfocal, well oriented, positive affect Breasts: The right breast is unremarkable. The left breast has undergone mastectomy with reconstruction. There is no evidence of recurrent or residual disease. Both axillae are benign.   LAB RESULTS:  CMP     Component Value Date/Time   NA 141 02/11/2021 1349   K 4.1  02/11/2021 1349   CL 103 02/11/2021 1349   CO2 24 02/11/2021 1349   GLUCOSE 87 02/11/2021 1349   GLUCOSE 117 (H) 11/25/2020 1156   BUN 16 02/11/2021 1349   CREATININE 0.73 02/11/2021 1349   CREATININE 0.82 10/26/2017 1218   CALCIUM 9.8 02/11/2021 1349   PROT 7.1 06/17/2020 1255   ALBUMIN 4.0 06/17/2020 1255   AST 19 06/17/2020 1255   AST 18 10/26/2017 1218   ALT 18 06/17/2020 1255   ALT 15 10/26/2017 1218   ALKPHOS 96 06/17/2020 1255  BILITOT 0.8 06/17/2020 1255   BILITOT 0.9 10/26/2017 1218   GFRNONAA >60 11/25/2020 1156   GFRNONAA >60 10/26/2017 1218   GFRAA >60 01/15/2020 1409   GFRAA >60 10/26/2017 1218    No results found for: TOTALPROTELP, ALBUMINELP, A1GS, A2GS, BETS, BETA2SER, GAMS, MSPIKE, SPEI  No results found for: KPAFRELGTCHN, LAMBDASER, KAPLAMBRATIO  Lab Results  Component Value Date   WBC 5.5 06/17/2021   NEUTROABS 2.7 06/17/2021   HGB 13.3 06/17/2021   HCT 38.6 06/17/2021   MCV 88.1 06/17/2021   PLT 200 06/17/2021    Lab Results  Component Value Date   LABCA2 30 09/16/2011    No components found for: ONGEXB284  No results for input(s): INR in the last 168 hours.  Lab Results  Component Value Date   LABCA2 30 09/16/2011    No results found for: XLK440  No results found for: NUU725  No results found for: DGU440  No results found for: CA2729  No components found for: HGQUANT  No results found for: CEA1 / No results found for: CEA1   No results found for: AFPTUMOR  No results found for: [place]  No results found for: HGBA, HGBA2QUANT, HGBFQUANT, HGBSQUAN (Hemoglobinopathy evaluation)   Lab Results  Component Value Date   LDH 162 09/02/2010    No results found for: IRON, TIBC, IRONPCTSAT (Iron and TIBC)  No results found for: FERRITIN  Urinalysis    Component Value Date/Time   LABSPEC 1.020 10/30/2008 1837   PHURINE 7.5 10/30/2008 1837   GLUCOSEU NEGATIVE 10/30/2008 1837   HGBUR LARGE (A) 10/30/2008 1837    BILIRUBINUR NEGATIVE 10/30/2008 [place] 10/30/2008 1837   PROTEINUR 100 (A) 10/30/2008 1837   UROBILINOGEN 0.2 10/30/2008 1837   NITRITE NEGATIVE 10/30/2008 1837   LEUKOCYTESUR (A) 10/30/2008 1837    MODERATE Biochemical Testing Only. Please order routine urinalysis from main lab if confirmatory testing is needed.    STUDIES: No results found.   ELIGIBLE FOR AVAILABLE RESEARCH PROTOCOL: no  ASSESSMENT: 65 y.o. [place] woman  (1) status post left lumpectomy and axillary lymph node dissection in 2008 for a T2N3 invasive ductal carcinoma, triple negative, pathology and treatment details not available at present--being retrieved  (a) status post adjuvant chemotherapy  (b) status post radiation  (2) status post left lumpectomy 12/05/2017 for a pT1a cN0, stage IA invasive ductal carcinoma, estrogen and progesterone receptor positive, HER-2 not amplified  (a) pathology also showed ductal carcinoma in situ, estrogen and progesterone receptor strongly positive  (b) both invasive and noninvasive components are focally less than 1 mm from the inferior margin  (3) genetics testing 11/14/2017 through the STAT Breast cancer panel offered by [facility] found no deleterious mutations in ATM, BRCA1, BRCA2, CDH1, CHEK2, PALB2, PTEN, STK11 and TP53.   (a) reflex genetic testing through the Common Hereditary Cancer Panel offered by [facility] additionally found no deleterious mutations in APC, ATM, AXIN2, BARD1, BMPR1A, BRCA1, BRCA2, BRIP1, CDH1, CDKN2A (p14ARF), CDKN2A (p16INK4a), CKD4, CHEK2, CTNNA1, DICER1, EPCAM (Deletion/duplication testing only), GREM1 (promoter region deletion/duplication testing only), KIT, MEN1, MLH1, MSH2, MSH3, MSH6, MUTYH, NBN, NF1, NHTL1, PALB2, PDGFRA, PMS2, POLD1, POLE, PTEN, RAD50, RAD51C, RAD51D, SDHB, SDHC, SDHD, SMAD4, SMARCA4. STK11, TP53, TSC1, TSC2, and VHL.  The following genes were evaluated for sequence changes only: SDHA and HOXB13 c.251G>A variant  only.  (c) variants of uncertain significance and APC and BR IP 1 were identified  (4) anastrozole started 12/25/2017  (a) bone density 10/16/2018 shows a T-score  of - 1.4  (5) status post left simple mastectomy with DIEP reconstruction at [facility] 03/07/2018  (a) pathology showed no residual cancer in the surgical sample   PLAN: Breena is now 3-1/2 years out from definitive surgery for her breast cancer with no evidence of disease recurrence.  This is very favorable.  She is tolerating anastrozole generally well.  I gave her information today on our pelvic rehab program in case she wishes to participate.  The plan is to continue anastrozole for total of 5 years.  I have encouraged her to exercise as much as she can.  She is limited by her knee problems.  Otherwise she will return to see [place] in 1 year, after her mammogram October 2023.  Total encounter time 25 minutes.  Christiann Hagerty, Virgie Dad, MD  06/17/21 12:15 PM Medical Oncology and Hematology [facility] [place], [place] [address] Tel. [phone]    Fax. [phone]   I, Wilburn Mylar, am acting as scribe for Dr. Virgie Dad. Doratha Mcswain.  I, Lurline Del MD, have reviewed the above documentation for accuracy and completeness, and I agree with the above.   *Total Encounter Time as defined by the Centers for Medicare and Medicaid Services includes, in addition to the face-to-face time of a patient visit (documented in the note above) non-face-to-face time: obtaining and reviewing outside history, ordering and reviewing medications, tests or procedures, care coordination (communications with other health care professionals or caregivers) and documentation in the medical record.

## 2021-06-22 ENCOUNTER — Encounter: Payer: Self-pay | Admitting: Oncology

## 2021-07-06 ENCOUNTER — Ambulatory Visit (INDEPENDENT_AMBULATORY_CARE_PROVIDER_SITE_OTHER): Payer: BC Managed Care – PPO | Admitting: Orthopaedic Surgery

## 2021-07-06 DIAGNOSIS — M25562 Pain in left knee: Secondary | ICD-10-CM

## 2021-07-06 DIAGNOSIS — M7661 Achilles tendinitis, right leg: Secondary | ICD-10-CM | POA: Diagnosis not present

## 2021-07-06 DIAGNOSIS — M25561 Pain in right knee: Secondary | ICD-10-CM | POA: Diagnosis not present

## 2021-07-06 DIAGNOSIS — G8929 Other chronic pain: Secondary | ICD-10-CM

## 2021-07-06 DIAGNOSIS — M79672 Pain in left foot: Secondary | ICD-10-CM | POA: Diagnosis not present

## 2021-07-06 MED ORDER — METHYLPREDNISOLONE ACETATE 40 MG/ML IJ SUSP
40.0000 mg | INTRAMUSCULAR | Status: AC | PRN
  Administered 2021-07-06: 40 mg via INTRA_ARTICULAR

## 2021-07-06 MED ORDER — LIDOCAINE HCL 1 % IJ SOLN
3.0000 mL | INTRAMUSCULAR | Status: AC | PRN
  Administered 2021-07-06: 3 mL

## 2021-07-06 NOTE — Progress Notes (Signed)
Office Visit Note   Patient: Linda Foster           Date of Birth: 09/01/1956           MRN: [id number] Visit Date: 07/06/2021              Requested by: Carol Ada, [place],  [place] [id number] PCP: Carol Ada, MD   Assessment & Plan: Visit Diagnoses:  1. Pain in left foot   2. Chronic pain of right knee   3. Chronic pain of left knee   4. Tendonitis, Achilles, right     Plan: I did place a steroid injection in her right knee today.  She is a candidate again for hyaluronic acid for the right knee.  I would also like to set her up for outpatient physical therapy to strengthen the muscles around both her knees as well as work on her Achilles tendon on the right side with any modalities per the therapist discretion.  I recommended Voltaren gel for the Achilles tendon as well.  I did give her a Thera-Band and showed her how to work on stretching for that.  I have also recommended she go to [employer] to see if they can appropriately fit her for shoes.  All questions and concerns were answered and addressed.  We will see her back in 4 weeks.  Follow-Up Instructions: Return in about 4 weeks (around 08/03/2021).   Orders:  Orders Placed This Encounter  Procedures   Large Joint Inj   No orders of the defined types were placed in this encounter.     Procedures: Large Joint Inj: R knee on 07/06/2021 10:04 AM Indications: diagnostic evaluation and pain Details: 22 G 1.5 in needle, superolateral approach  Arthrogram: No  Medications: 3 mL lidocaine 1 %; 40 mg methylPREDNISolone acetate 40 MG/ML Outcome: tolerated well, no immediate complications Procedure, treatment alternatives, risks and benefits explained, specific risks discussed. Consent was given by the patient. Immediately prior to procedure a time out was called to verify the correct patient, procedure, equipment, support staff and site/side marked as required. Patient was prepped and  draped in the usual sterile fashion.      Clinical Data: No additional findings.   Subjective: Chief Complaint  Patient presents with   Left Knee - Pain   Right Knee - Pain  The patient is well-known to me.  She has known arthritis in both her knees with the right worse than left.  Her right knee has been hurting her worse.  She is also been dealing with right Achilles and foot pain as well.  She has had injections in the past and her knee and is trying to avoid surgery but is getting the point where this is detriment affecting her mobility, quality of life and actives daily living.  She has tried different shoes for her right heel and that has not helped.  She has not had any type of physical therapy.  We x-rayed her knees last year showing significant tricompartment arthritis of the right knee.  HPI  Review of Systems   Objective: Vital Signs: There were no vitals taken for this visit.  Physical Exam She is alert and orient x3 and in no acute distress Ortho Exam Examination of her right knee shows slight varus malalignment with global tenderness with excellent range of motion.  Her Achilles on the right side is intact but she is deftly showing Achilles insertional pain and  pain along the course of the Achilles tendon.  Her [place] Silos test is negative. Specialty Comments:  No specialty comments available.  Imaging: No results found.   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 01/03/2018   Family history of prostate cancer 01/02/2018   Genetic testing 12/15/2017   Primary malignant neoplasm of lower outer quadrant of female breast, left ([place]) 12/14/2017   History of therapeutic radiation  11/01/2017   Ductal carcinoma in situ (DCIS) of left breast 10/24/2017   Chest pain with moderate risk of acute coronary syndrome 11/03/2016   Atrial fibrillation with rapid ventricular response ([place]) 11/02/2016   History of invasive breast cancer 11/06/2012   Past Medical History:  Diagnosis Date   Breast cancer ([place]) 2008   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   Breast cancer ([place]) 2019   left ER/PR +,  XRT and chemotherapy   Family history of adverse reaction to anesthesia    N&V   Family history of prostate cancer    Lymphedema 2013   OSA (obstructive sleep apnea)    uses a dental appliance   Osteoarthritis    Persistent atrial fibrillation ([facility])    History of Afib   PONV (postoperative nausea and vomiting)     Family History  Problem Relation Age of Onset   Hypertension Mother    COPD Father    Prostate cancer Father 10   Lung cancer Brother 75   Prostate cancer Brother 30   Cancer Cousin        type unk dx. >50   Cancer Cousin 27       type unk   Cancer Cousin        type and age dx unkown    Past Surgical History:  Procedure Laterality Date   ABDOMINAL HYSTERECTOMY     ATRIAL FIBRILLATION ABLATION N/A 03/03/2021   Procedure: ATRIAL FIBRILLATION ABLATION;  Surgeon: Thompson Grayer, MD;  Location: [facility];  Service: Cardiovascular;  Laterality: N/A;   BREAST LUMPECTOMY Left 2008   BREAST LUMPECTOMY WITH RADIOACTIVE SEED LOCALIZATION Left 12/05/2017   Procedure: BREAST LUMPECTOMY WITH RADIOACTIVE SEED LOCALIZATION;  Surgeon: Rolm Bookbinder, MD;  Location: [facility];  Service: General;  Laterality: Left;   BREAST RECONSTRUCTION Left 2019   no BP on Lt arm   CARDIOVERSION N/A 12/01/2020   Procedure: CARDIOVERSION;  Surgeon: Larey Dresser, MD;  Location[facility];  Service: Cardiovascular;  Laterality: N/A;   CESAREAN SECTION      x 3   KNEE ARTHROSCOPY Right 2007   TEE WITHOUT CARDIOVERSION N/A 12/01/2020   Procedure: TRANSESOPHAGEAL  ECHOCARDIOGRAM (TEE);  Surgeon: Larey Dresser, MD;  Location: [facility] ENDOSCOPY;  Service: Cardiovascular;  Laterality: N/A;   TOTAL HIP ARTHROPLASTY Left 06/15/2019   Procedure: LEFT TOTAL HIP ARTHROPLASTY ANTERIOR APPROACH;  Surgeon: Mcarthur Rossetti, MD;  Location: [facility];  Service: Orthopedics;  Laterality: Left;   Social History   Occupational History   Not on file  Tobacco Use   Smoking status: Never   Smokeless tobacco: Never  Vaping Use   Vaping Use: Never used  Substance and Sexual Activity   Alcohol use: No   Drug use: No   Sexual activity: Not  on file

## 2021-07-07 ENCOUNTER — Other Ambulatory Visit: Payer: Self-pay

## 2021-07-07 ENCOUNTER — Telehealth: Payer: Self-pay

## 2021-07-07 DIAGNOSIS — M25562 Pain in left knee: Secondary | ICD-10-CM

## 2021-07-07 DIAGNOSIS — G8929 Other chronic pain: Secondary | ICD-10-CM

## 2021-07-07 DIAGNOSIS — M7661 Achilles tendinitis, right leg: Secondary | ICD-10-CM

## 2021-07-07 NOTE — Telephone Encounter (Signed)
Right knee gel injection  

## 2021-07-07 NOTE — Telephone Encounter (Signed)
Noted  

## 2021-07-09 ENCOUNTER — Telehealth: Payer: Self-pay

## 2021-07-09 NOTE — Telephone Encounter (Deleted)
VOB submitted for Monovisc, left knee. Pending BV.

## 2021-07-09 NOTE — Telephone Encounter (Signed)
VOB submitted for SynviscOne, right knee. Pending BV. 

## 2021-07-14 ENCOUNTER — Encounter: Payer: Self-pay | Admitting: Physical Therapy

## 2021-07-14 ENCOUNTER — Ambulatory Visit (INDEPENDENT_AMBULATORY_CARE_PROVIDER_SITE_OTHER): Payer: BC Managed Care – PPO | Admitting: Physical Therapy

## 2021-07-14 ENCOUNTER — Other Ambulatory Visit: Payer: Self-pay

## 2021-07-14 DIAGNOSIS — M25561 Pain in right knee: Secondary | ICD-10-CM

## 2021-07-14 DIAGNOSIS — R262 Difficulty in walking, not elsewhere classified: Secondary | ICD-10-CM | POA: Diagnosis not present

## 2021-07-14 DIAGNOSIS — M25562 Pain in left knee: Secondary | ICD-10-CM | POA: Diagnosis not present

## 2021-07-14 DIAGNOSIS — M25571 Pain in right ankle and joints of right foot: Secondary | ICD-10-CM

## 2021-07-14 DIAGNOSIS — G8929 Other chronic pain: Secondary | ICD-10-CM

## 2021-07-14 DIAGNOSIS — M6281 Muscle weakness (generalized): Secondary | ICD-10-CM

## 2021-07-14 NOTE — Therapy (Signed)
[facility] [address] [place], [place], [address] Phone: [phone]   Fax:  [phone]  Physical Therapy Evaluation  Patient Details  Name: Linda Foster MRN: [id number] Date of Birth: [birth year] Referring Provider (PT): Mcarthur Rossetti, MD   Encounter Date: 07/14/2021   PT End of Session - 07/14/21 1313     Visit Number 1    Number of Visits 12    Date for PT Re-Evaluation 08/25/21    PT Start Time 0263    PT Stop Time 1234    PT Time Calculation (min) 49 min    Activity Tolerance Patient tolerated treatment well    Behavior During Therapy [facility] for tasks assessed/performed             Past Medical History:  Diagnosis Date   Breast cancer ([place]) 2008   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   Breast cancer ([place]) 2019   left ER/PR +,  XRT and chemotherapy   Family history of adverse reaction to anesthesia    N&V   Family history of prostate cancer    Lymphedema 2013   OSA (obstructive sleep apnea)    uses a dental appliance   Osteoarthritis    Persistent atrial fibrillation ([place])    History of Afib   PONV (postoperative nausea and vomiting)     Past Surgical History:  Procedure Laterality Date   ABDOMINAL HYSTERECTOMY     ATRIAL FIBRILLATION ABLATION N/A 03/03/2021   Procedure: ATRIAL FIBRILLATION ABLATION;  Surgeon: Thompson Grayer, MD;  Location: [facility];  Service: Cardiovascular;  Laterality: N/A;   BREAST LUMPECTOMY Left 2008   BREAST LUMPECTOMY WITH RADIOACTIVE SEED LOCALIZATION Left 12/05/2017   Procedure: BREAST LUMPECTOMY WITH RADIOACTIVE SEED LOCALIZATION;  Surgeon: Rolm Bookbinder, MD;  Location: [facility];  Service: General;  Laterality: Left;   BREAST RECONSTRUCTION Left 2019   no BP on Lt arm   CARDIOVERSION N/A 12/01/2020   Procedure: CARDIOVERSION;  Surgeon: Larey Dresser, MD;  Location: [facility] ENDOSCOPY;  Service: Cardiovascular;  Laterality: N/A;   CESAREAN SECTION      x 3    KNEE ARTHROSCOPY Right 2007   TEE WITHOUT CARDIOVERSION N/A 12/01/2020   Procedure: TRANSESOPHAGEAL ECHOCARDIOGRAM (TEE);  Surgeon: Larey Dresser, MD;  Location: [facility] ENDOSCOPY;  Service: Cardiovascular;  Laterality: N/A;   TOTAL HIP ARTHROPLASTY Left 06/15/2019   Procedure: LEFT TOTAL HIP ARTHROPLASTY ANTERIOR APPROACH;  Surgeon: Mcarthur Rossetti, MD;  Location: [facility];  Service: Orthopedics;  Laterality: Left;    There were no vitals filed for this visit.    Subjective Assessment - 07/14/21 1148     Subjective She relays chronic knee pain with OA and Rt heel pain. She had injection on 07/06/21 to her Rt knee which has helped and has actually helped her Rt heel some as well.    Currently in Pain? Yes    Pain Score 3     Pain Location Knee   and Rt heel pain is 3-6   Pain Orientation Right;Left    Pain Descriptors / Indicators Aching    Pain Type Chronic pain    Pain Radiating Towards from knee to heel    Pain Onset More than a month ago    Pain Frequency Intermittent    Aggravating Factors  standing, walking, gettng out of car    Pain Relieving Factors wearing supportive shoes, injection    Multiple Pain Sites No  [facility] PT Assessment - 07/14/21 0001       Assessment   Medical Diagnosis chronic bilateral knee pain and OA with Rt achillies tendonitis    Referring Provider (PT) Mcarthur Rossetti, MD    Onset Date/Surgical Date --   one year onset of pain   Prior Therapy nothing recent      Precautions   Precautions None      Restrictions   Weight Bearing Restrictions No      Balance Screen   Has the patient fallen in the past 6 months No    Has the patient had a decrease in activity level because of a fear of falling?  No    Is the patient reluctant to leave their home because of a fear of falling?  No      Home Ecologist residence      Prior Function   Level of Independence Independent    Vocation  Retired    Leisure grandkids      Cognition   Overall Cognitive Status Within Functional Limits for tasks assessed      Observation/Other Assessments   Focus on Therapeutic Outcomes (FOTO)  57% functional intake, goal is 69%      ROM / Strength   AROM / PROM / Strength AROM;PROM;Strength      AROM   Overall AROM Comments Rt ankle WNL except DL limited to 5 deg AROM and 10 deg PROM, Rt knee AROM 5-105 and Lt knee AROM 0-115      Strength   Strength Assessment Site Hip;Knee;Ankle    Right/Left Hip Right;Left    Right Hip Flexion 4+/5    Right Hip Extension 4+/5    Right Hip ABduction 4+/5    Left Hip Flexion 4/5    Left Hip Extension 4/5    Left Hip ABduction 4+/5    Right/Left Knee Right;Left    Right Knee Flexion 4/5    Right Knee Extension 4+/5    Left Knee Flexion 4/5    Left Knee Extension 4+/5    Right/Left Ankle Right    Right Ankle Dorsiflexion 5/5    Right Ankle Plantar Flexion 4+/5    Right Ankle Inversion 4/5    Right Ankle Eversion 5/5      Flexibility   Soft Tissue Assessment /Muscle Length --   tight gastroc-soleus and quads on Rt     Palpation   Palpation comment tender to palpation Rt gastroc and achillies and plantar fascia      Transfers   Transfers Independent with all Transfers      Ambulation/Gait   Gait Comments indepdent community ambulator, decreased hip/knee and DF noted on Rt                        Objective measurements completed on examination: See above findings.       [facility] Adult PT Treatment/Exercise - 07/14/21 0001       Modalities   Modalities Iontophoresis      Iontophoresis   Type of Iontophoresis Dexamethasone    Location Rt heel    Dose 1.0 CC    Time 4-6 hour wear home patch                       PT Short Term Goals - 07/14/21 1319       PT SHORT TERM GOAL #1   Title Pt will be I  and compliant with HEP    Time 4    Period Weeks    Status New    Target Date 08/11/21                PT Long Term Goals - 07/14/21 1319       PT LONG TERM GOAL #1   Title Pt will improve FOTO functional score to 69%    Time 6    Period Weeks    Status New      PT LONG TERM GOAL #2   Title Pt will report less overall pain with squatting, walking, stairs    Time 6    Period Weeks    Status New      PT LONG TERM GOAL #3   Title She will show >10 degrees Rt ankle DF ROM and 3-110 degrees Rt knee flexion ROM for functional mobility    Time 6    Period Weeks    Status New      PT LONG TERM GOAL #4   Title She will show grossly 5/5 MMT for bilat hip/knee/ankle strength tested in sitting and supine for improved functional strength.    Time 6    Period Weeks    Status New                    Plan - 07/14/21 1314     Clinical Impression Statement Pt presents with chronic bilateral knee pain and OA with Rt achillies tendonitis. Pain has improved some after cortizone injection to Rt knee. She does have overall decreased Rt knee and ankle ROM, decreased leg strength, and increased pain with standing, walking, and squatting. She will benefit from skilled PT to address her functional deficits. We did trial [place] today on her Rt achillies in efforts to reduce pain and inflammation.    Personal Factors and Comorbidities Comorbidity 3+    Comorbidities BOF:BPZWC knee OA, Rt knee scope 2007,Lt THA,breast CA,Afib    Examination-Activity Limitations Stand;Stairs;Squat;Locomotion Level    Examination-Participation Restrictions Cleaning;Community Activity;Shop    Stability/Clinical Decision Making Evolving/Moderate complexity    Clinical Decision Making Moderate    Rehab Potential Good    PT Frequency 2x / week   1-2   PT Duration 6 weeks    PT Treatment/Interventions Cryotherapy;Electrical Stimulation;Iontophoresis 4mg /ml Dexamethasone;Moist Heat;Ultrasound;Gait training;Stair training;Therapeutic activities;Therapeutic exercise;Neuromuscular re-education;Manual  techniques;Passive range of motion;Dry needling;Joint Manipulations;Vasopneumatic Device;Taping    PT Next Visit Plan how was ionto? how is HEP going? needs overall hip/knee/ankle PF strength and heelcord stretching    PT Home Exercise Plan Access Code: [email or web address]    Consulted and Agree with Plan of Care Patient             Patient will benefit from skilled therapeutic intervention in order to improve the following deficits and impairments:  Decreased activity tolerance, Decreased range of motion, Decreased strength, Difficulty walking, Impaired flexibility, Pain  Visit Diagnosis: Chronic pain of right knee  Chronic pain of left knee  Pain in right ankle and joints of right foot  Difficulty in walking, not elsewhere classified  Muscle weakness (generalized)     Problem List Patient Active Problem List   Diagnosis Date Noted   Paroxysmal atrial fibrillation ([place]) 11/25/2020   Unilateral primary osteoarthritis, left hip 06/15/2019   Status post total replacement of left hip 06/15/2019   OSA (obstructive sleep apnea) 02/13/2018   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 [place], [place], [address] Phone: [phone]   Fax:  [phone]  Name: ASYIA HORNUNG MRN: [id number] Date of Birth: 10-Jan-1956

## 2021-07-14 NOTE — Patient Instructions (Signed)
Access Code: [email or web address] URL: [email or web address] Date: 07/14/2021 Prepared by: Elsie Ra  Exercises Standing Gastroc Stretch - 2 x daily - 6 x weekly - 1 sets - 3 reps - 30 hold Standing Soleus Stretch - 2 x daily - 6 x weekly - 1 sets - 3 reps - 30 hold Standing Heel Raise with Support - 2 x daily - 6 x weekly - 2-3 sets - 10 reps - 2 sec hold Mini Squat with Counter Support - 2 x daily - 6 x weekly - 1-2 sets - 10 reps - 2 sec hold Standing Partial Lunge - 2 x daily - 6 x weekly - 1 sets - 10-15 reps Seated Knee Flexion Stretch - 2 x daily - 6 x weekly - 1-2 sets - 10 reps - 5 sec hold Supine Quadriceps Stretch with Strap on Table - 2 x daily - 6 x weekly - 2-3 reps - 30 hold

## 2021-07-16 ENCOUNTER — Other Ambulatory Visit: Payer: Self-pay

## 2021-07-16 ENCOUNTER — Ambulatory Visit (INDEPENDENT_AMBULATORY_CARE_PROVIDER_SITE_OTHER): Payer: BC Managed Care – PPO | Admitting: Rehabilitative and Restorative Service Providers"

## 2021-07-16 ENCOUNTER — Encounter: Payer: Self-pay | Admitting: Rehabilitative and Restorative Service Providers"

## 2021-07-16 ENCOUNTER — Telehealth: Payer: Self-pay

## 2021-07-16 DIAGNOSIS — M25561 Pain in right knee: Secondary | ICD-10-CM | POA: Diagnosis not present

## 2021-07-16 DIAGNOSIS — M6281 Muscle weakness (generalized): Secondary | ICD-10-CM

## 2021-07-16 DIAGNOSIS — M25562 Pain in left knee: Secondary | ICD-10-CM | POA: Diagnosis not present

## 2021-07-16 DIAGNOSIS — R262 Difficulty in walking, not elsewhere classified: Secondary | ICD-10-CM | POA: Diagnosis not present

## 2021-07-16 DIAGNOSIS — G8929 Other chronic pain: Secondary | ICD-10-CM

## 2021-07-16 DIAGNOSIS — M25571 Pain in right ankle and joints of right foot: Secondary | ICD-10-CM

## 2021-07-16 NOTE — Patient Instructions (Signed)
Access Code: [email or web address] URL: [email or web address] Date: 07/16/2021 Prepared by: Vista Mink  Exercises Standing Gastroc Stretch - 2 x daily - 6 x weekly - 1 sets - 3 reps - 30 hold Standing Soleus Stretch - 2 x daily - 6 x weekly - 1 sets - 3 reps - 30 hold Standing Heel Raise with Support - 2 x daily - 6 x weekly - 2-3 sets - 10 reps - 2 sec hold Mini Squat with Counter Support - 2 x daily - 6 x weekly - 1-2 sets - 10 reps - 2 sec hold Standing Partial Lunge - 2 x daily - 6 x weekly - 1 sets - 10-15 reps Seated Knee Flexion Stretch - 2 x daily - 6 x weekly - 1-2 sets - 10 reps - 5 sec hold Supine Quadriceps Stretch with Strap on Table - 2 x daily - 6 x weekly - 2-3 reps - 30 hold Small Range Straight Leg Raise - 1-2 x daily - 7 x weekly - 4-5 sets - 5 reps - 3 seconds hold Slant Board Gastrocnemius Stretch - 2-3 x daily - 7 x weekly - 1 sets - 3 reps - 30-60 hold Sit to Stand with Armchair - 2 x daily - 7 x weekly - 1 sets - 10 reps

## 2021-07-16 NOTE — Telephone Encounter (Signed)
BV still pending

## 2021-07-16 NOTE — Therapy (Signed)
[facility] Physical Therapy [address] [place], [place], [address] Phone: [phone]   Fax:  [phone]  Physical Therapy Treatment  Patient Details  Name: Linda Foster MRN: [id number] Date of Birth: 1956/06/15 Referring Provider (PT): Mcarthur Rossetti, MD   Encounter Date: 07/16/2021   PT End of Session - 07/16/21 1516     Visit Number 2    Number of Visits 12    Date for PT Re-Evaluation 08/25/21    PT Start Time 9417    PT Stop Time 4081    PT Time Calculation (min) 44 min    Activity Tolerance Patient tolerated treatment well    Behavior During Therapy [facility] for tasks assessed/performed             Past Medical History:  Diagnosis Date   Breast cancer ([place]) 2008   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   Breast cancer ([place]) 2019   left ER/PR +,  XRT and chemotherapy   Family history of adverse reaction to anesthesia    N&V   Family history of prostate cancer    Lymphedema 2013   OSA (obstructive sleep apnea)    uses a dental appliance   Osteoarthritis    Persistent atrial fibrillation ([place])    History of Afib   PONV (postoperative nausea and vomiting)     Past Surgical History:  Procedure Laterality Date   ABDOMINAL HYSTERECTOMY     ATRIAL FIBRILLATION ABLATION N/A 03/03/2021   Procedure: ATRIAL FIBRILLATION ABLATION;  Surgeon: Thompson Grayer, MD;  Location: [place] [facility];  Service: Cardiovascular;  Laterality: N/A;   BREAST LUMPECTOMY Left 2008   BREAST LUMPECTOMY WITH RADIOACTIVE SEED LOCALIZATION Left 12/05/2017   Procedure: BREAST LUMPECTOMY WITH RADIOACTIVE SEED LOCALIZATION;  Surgeon: Rolm Bookbinder, MD;  Location: [place];  Service: General;  Laterality: Left;   BREAST RECONSTRUCTION Left 2019   no BP on Lt arm   CARDIOVERSION N/A 12/01/2020   Procedure: CARDIOVERSION;  Surgeon: Larey Dresser, MD;  Location: [facility] ENDOSCOPY;  Service: Cardiovascular;  Laterality: N/A;   CESAREAN SECTION      x 3    KNEE ARTHROSCOPY Right 2007   TEE WITHOUT CARDIOVERSION N/A 12/01/2020   Procedure: TRANSESOPHAGEAL ECHOCARDIOGRAM (TEE);  Surgeon: Larey Dresser, MD;  Location: [facility] ENDOSCOPY;  Service: Cardiovascular;  Laterality: N/A;   TOTAL HIP ARTHROPLASTY Left 06/15/2019   Procedure: LEFT TOTAL HIP ARTHROPLASTY ANTERIOR APPROACH;  Surgeon: Mcarthur Rossetti, MD;  Location: [facility];  Service: Orthopedics;  Laterality: Left;    There were no vitals filed for this visit.   Subjective Assessment - 07/16/21 1513     Subjective Olanna reports her R knee injection is still helping.  B knee pain/soreness is noted.    Currently in Pain? Yes    Pain Score 3     Pain Location Knee    Pain Orientation Right;Left    Pain Descriptors / Indicators Aching;Sore    Pain Type Chronic pain    Pain Radiating Towards OA pain and R Achilles pain    Pain Onset More than a month ago    Pain Frequency Intermittent    Aggravating Factors  WB function, particularly prolonged standing and walking    Pain Relieving Factors Change of position    Effect of Pain on Daily Activities Needs to take breaks to get off her feet  [facility] Adult PT Treatment/Exercise - 07/16/21 0001       Exercises   Exercises Ankle;Knee/Hip      Knee/Hip Exercises: Stretches   Other Knee/Hip Stretches Seated knee flexion AAROM 5X 10 seconds      Knee/Hip Exercises: Standing   Heel Raises Both;15 reps;3 seconds;Limitations    Heel Raises Limitations slow eccentrics and shift most weight to R side      Knee/Hip Exercises: Seated   Long Arc Quad Strengthening;Both;3 sets;5 reps;Limitations    Long Arc Quad Limitations Seated straight leg raises (toes back, press down and tighten thigh, pause 2 seconds, raise 6-8 inches, slowly lower)    Other Seated Knee/Hip Exercises Mini squat at counter 10X 5 seconds    Sit to [employer] 10 reps;with UE support      Ankle Exercises: Stretches   Soleus Stretch  4 reps;20 seconds    Soleus Stretch Limitations Slight toe in and straight shoulder to hip to knee    Gastroc Stretch 4 reps;20 seconds;Limitations    Gastroc Stretch Limitations Slight toe in    Slant Board Stretch 2 reps;60 seconds;Limitations    Slant Board Stretch Limitations Knees straight and bent slight toe in                     PT Education - 07/16/21 1514     Education Details Reviewed HEP and added seated straight leg raises for NWB quadriceps strengthening to complement her current functional strength.  Added heel cords box as another Achilles stretch option.    Person(s) Educated Patient    Methods Handout;Explanation;Demonstration;Verbal cues;Tactile cues    Comprehension Tactile cues required;Verbalized understanding;Returned demonstration;Need further instruction;Verbal cues required              PT Short Term Goals - 07/14/21 1319       PT SHORT TERM GOAL #1   Title Pt will be I and compliant with HEP    Time 4    Period Weeks    Status New    Target Date 08/11/21               PT Long Term Goals - 07/14/21 1319       PT LONG TERM GOAL #1   Title Pt will improve FOTO functional score to 69%    Time 6    Period Weeks    Status New      PT LONG TERM GOAL #2   Title Pt will report less overall pain with squatting, walking, stairs    Time 6    Period Weeks    Status New      PT LONG TERM GOAL #3   Title She will show >10 degrees Rt ankle DF ROM and 3-110 degrees Rt knee flexion ROM for functional mobility    Time 6    Period Weeks    Status New      PT LONG TERM GOAL #4   Title She will show grossly 5/5 MMT for bilat hip/knee/ankle strength tested in sitting and supine for improved functional strength.    Time 6    Period Weeks    Status New                   Plan - 07/16/21 1517     Clinical Impression Statement Sydell has a great starter HEP addressing heel cords length and strength and quadriceps strength.  We  reviewed this and she was given options of a  heel cord slant board and a NWB quadriceps strength exercise to complement her current program.  Heel cords length, strength (emphasis on eccentrics) and quadriceps strength will be the focus of her home and clinic program.    Personal Factors and Comorbidities Comorbidity 3+    Comorbidities GYK:ZLDJT knee OA, Rt knee scope 2007,Lt THA,breast CA,Afib    Examination-Activity Limitations Stand;Stairs;Squat;Locomotion Level    Examination-Participation Restrictions Cleaning;Community Activity;Shop    Stability/Clinical Decision Making Evolving/Moderate complexity    Rehab Potential Good    PT Frequency 2x / week   1-2   PT Duration 6 weeks    PT Treatment/Interventions Cryotherapy;Electrical Stimulation;Iontophoresis 4mg /ml Dexamethasone;Moist Heat;Ultrasound;Gait training;Stair training;Therapeutic activities;Therapeutic exercise;Neuromuscular re-education;Manual techniques;Passive range of motion;Dry needling;Joint Manipulations;Vasopneumatic Device;Taping    PT Next Visit Plan Quadriceps strength, eccentric plantar flexors strength, ionto to Achilles if time    PT Home Exercise Plan Access Code: [email or web address]    Consulted and Agree with Plan of Care Patient             Patient will benefit from skilled therapeutic intervention in order to improve the following deficits and impairments:  Decreased activity tolerance, Decreased range of motion, Decreased strength, Difficulty walking, Impaired flexibility, Pain  Visit Diagnosis: Difficulty in walking, not elsewhere classified  Muscle weakness (generalized)  Chronic pain of right knee  Chronic pain of left knee  Pain in right ankle and joints of right foot     Problem List Patient Active Problem List   Diagnosis Date Noted   Paroxysmal atrial fibrillation ([place]) 11/25/2020   Unilateral primary osteoarthritis, left hip 06/15/2019   Status post total replacement of left hip 06/15/2019   OSA  (obstructive sleep apnea) 02/13/2018   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 01/03/2018   Family history of prostate cancer 01/02/2018   Genetic testing 12/15/2017   Primary malignant neoplasm of lower outer quadrant of female breast, left ([place]) 12/14/2017   History of therapeutic radiation 11/01/2017   Ductal carcinoma in situ (DCIS) of left breast 10/24/2017   Chest pain with moderate risk of acute coronary syndrome 11/03/2016   Atrial fibrillation with rapid ventricular response ([place]) 11/02/2016   History of invasive breast cancer 11/06/2012    Farley Ly, PT, MPT 07/16/2021, 3:20 PM  [facility] Physical Therapy [address] [place], [place], [address] Phone: [phone]   Fax:  [phone]  Name: TEKOA HAMOR MRN: [id number] Date of Birth: 1955/10/07

## 2021-07-20 ENCOUNTER — Telehealth: Payer: Self-pay

## 2021-07-20 NOTE — Telephone Encounter (Signed)
PA required for Synvisc[employer], right knee. PA submitted online through [employer] Pending PA# [id number]

## 2021-07-24 ENCOUNTER — Encounter: Payer: BC Managed Care – PPO | Admitting: Rehabilitative and Restorative Service Providers"

## 2021-07-28 ENCOUNTER — Encounter: Payer: Self-pay | Admitting: Physical Therapy

## 2021-07-28 ENCOUNTER — Other Ambulatory Visit: Payer: Self-pay

## 2021-07-28 ENCOUNTER — Ambulatory Visit (INDEPENDENT_AMBULATORY_CARE_PROVIDER_SITE_OTHER): Payer: BC Managed Care – PPO | Admitting: Physical Therapy

## 2021-07-28 ENCOUNTER — Telehealth: Payer: Self-pay

## 2021-07-28 DIAGNOSIS — R262 Difficulty in walking, not elsewhere classified: Secondary | ICD-10-CM

## 2021-07-28 DIAGNOSIS — G8929 Other chronic pain: Secondary | ICD-10-CM

## 2021-07-28 DIAGNOSIS — M25561 Pain in right knee: Secondary | ICD-10-CM

## 2021-07-28 DIAGNOSIS — M25562 Pain in left knee: Secondary | ICD-10-CM | POA: Diagnosis not present

## 2021-07-28 DIAGNOSIS — M6281 Muscle weakness (generalized): Secondary | ICD-10-CM

## 2021-07-28 DIAGNOSIS — M25571 Pain in right ankle and joints of right foot: Secondary | ICD-10-CM

## 2021-07-28 NOTE — Therapy (Signed)
[facility] [place] Physical Therapy [address] [place], [place], [address] Phone: [phone]   Fax:  [phone]  Physical Therapy Treatment  Patient Details  Name: Linda Foster MRN: [id number] Date of Birth: [birth year] Referring Provider (PT): Mcarthur Rossetti, MD   Encounter Date: 07/28/2021   PT End of Session - 07/28/21 1444     Visit Number 3    Number of Visits 12    Date for PT Re-Evaluation 08/25/21    PT Start Time 0100    PT Stop Time 0146    PT Time Calculation (min) 46 min    Activity Tolerance Patient tolerated treatment well    Behavior During Therapy [facility] for tasks assessed/performed             Past Medical History:  Diagnosis Date   Breast cancer ([place]) 2008   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   Breast cancer ([place]) 2019   left ER/PR +,  XRT and chemotherapy   Family history of adverse reaction to anesthesia    N&V   Family history of prostate cancer    Lymphedema 2013   OSA (obstructive sleep apnea)    uses a dental appliance   Osteoarthritis    Persistent atrial fibrillation ([place])    History of Afib   PONV (postoperative nausea and vomiting)     Past Surgical History:  Procedure Laterality Date   ABDOMINAL HYSTERECTOMY     ATRIAL FIBRILLATION ABLATION N/A 03/03/2021   Procedure: ATRIAL FIBRILLATION ABLATION;  Surgeon: Thompson Grayer, MD;  Location: [place] [facility];  Service: Cardiovascular;  Laterality: N/A;   BREAST LUMPECTOMY Left 2008   BREAST LUMPECTOMY WITH RADIOACTIVE SEED LOCALIZATION Left 12/05/2017   Procedure: BREAST LUMPECTOMY WITH RADIOACTIVE SEED LOCALIZATION;  Surgeon: Rolm Bookbinder, MD;  Location: [place];  Service: General;  Laterality: Left;   BREAST RECONSTRUCTION Left 2019   no BP on Lt arm   CARDIOVERSION N/A 12/01/2020   Procedure: CARDIOVERSION;  Surgeon: Larey Dresser, MD;  Location: [facility] ENDOSCOPY;  Service: Cardiovascular;  Laterality: N/A;   CESAREAN SECTION      x 3    KNEE ARTHROSCOPY Right 2007   TEE WITHOUT CARDIOVERSION N/A 12/01/2020   Procedure: TRANSESOPHAGEAL ECHOCARDIOGRAM (TEE);  Surgeon: Larey Dresser, MD;  Location: [facility] ENDOSCOPY;  Service: Cardiovascular;  Laterality: N/A;   TOTAL HIP ARTHROPLASTY Left 06/15/2019   Procedure: LEFT TOTAL HIP ARTHROPLASTY ANTERIOR APPROACH;  Surgeon: Mcarthur Rossetti, MD;  Location[facility];  Service: Orthopedics;  Laterality: Left;    There were no vitals filed for this visit.   Subjective Assessment - 07/28/21 1439     Subjective She relays she was doing some better but then she walked 6 hours at a christmas convention yesterday so she is really flared up today and has 6/10 pain in her Rt achilies    Pain Onset More than a month ago                               [facility] Adult PT Treatment/Exercise - 07/28/21 0001       Knee/Hip Exercises: Standing   Heel Raises Both;15 reps;3 seconds;Limitations    Heel Raises Limitations heel and toe raises    Rocker Board 2 minutes    Rocker Board Limitations A-P with intermit UE support      Knee/Hip Exercises: Seated   Long Arc Quad Limitations Seated straight leg raises (toes  back, press down and tighten thigh, pause 2 seconds at top. 10 reps then 4 reps bilat      Iontophoresis   Type of Iontophoresis Dexamethasone    Location Rt heel    Dose 1.0 CC    Time 4-6 hour wear home patch      Manual Therapy   Manual therapy comments STM and IASTM to Rt gastroc-solus, achillies, and plantar fascia      Ankle Exercises: Stretches   Soleus Stretch 2 reps;30 seconds    Soleus Stretch Limitations long sitting with strap    Gastroc Stretch 2 reps;30 seconds    Gastroc Stretch Limitations long sitting with strap    Slant Board Stretch 4 reps;30 seconds   2 gastroc, 2 soleus     Ankle Exercises: Supine   T-Band green X 20 4 way on Rt                       PT Short Term Goals - 07/14/21 1319       PT SHORT TERM GOAL #1    Title Pt will be I and compliant with HEP    Time 4    Period Weeks    Status New    Target Date 08/11/21               PT Long Term Goals - 07/14/21 1319       PT LONG TERM GOAL #1   Title Pt will improve FOTO functional score to 69%    Time 6    Period Weeks    Status New      PT LONG TERM GOAL #2   Title Pt will report less overall pain with squatting, walking, stairs    Time 6    Period Weeks    Status New      PT LONG TERM GOAL #3   Title She will show >10 degrees Rt ankle DF ROM and 3-110 degrees Rt knee flexion ROM for functional mobility    Time 6    Period Weeks    Status New      PT LONG TERM GOAL #4   Title She will show grossly 5/5 MMT for bilat hip/knee/ankle strength tested in sitting and supine for improved functional strength.    Time 6    Period Weeks    Status New                   Plan - 07/28/21 1445     Clinical Impression Statement She has flare up from too much walking yesterday so we treated her with massage, exerecises, stretching and ionto which helped to decrease her overall pain with walking after session. She was encouraged to consistently stretch at home. Continue POC    Personal Factors and Comorbidities Comorbidity 3+    Comorbidities TTS:VXBLT knee OA, Rt knee scope 2007,Lt THA,breast CA,Afib    Examination-Activity Limitations Stand;Stairs;Squat;Locomotion Level    Examination-Participation Restrictions Cleaning;Community Activity;Shop    Stability/Clinical Decision Making Evolving/Moderate complexity    Rehab Potential Good    PT Frequency 2x / week   1-2   PT Duration 6 weeks    PT Treatment/Interventions Cryotherapy;Electrical Stimulation;Iontophoresis 4mg /ml Dexamethasone;Moist Heat;Ultrasound;Gait training;Stair training;Therapeutic activities;Therapeutic exercise;Neuromuscular re-education;Manual techniques;Passive range of motion;Dry needling;Joint Manipulations;Vasopneumatic Device;Taping    PT Next Visit Plan  Quadriceps strength, eccentric plantar flexors strength,stretching achillies, ionto to Achilles if desired    PT Home Exercise Plan Access Code: [email or web address]  Consulted and Agree with Plan of Care Patient             Patient will benefit from skilled therapeutic intervention in order to improve the following deficits and impairments:  Decreased activity tolerance, Decreased range of motion, Decreased strength, Difficulty walking, Impaired flexibility, Pain  Visit Diagnosis: Difficulty in walking, not elsewhere classified  Muscle weakness (generalized)  Chronic pain of right knee  Chronic pain of left knee  Pain in right ankle and joints of right foot     Problem List Patient Active Problem List   Diagnosis Date Noted   Paroxysmal atrial fibrillation ([place]) 11/25/2020   Unilateral primary osteoarthritis, left hip 06/15/2019   Status post total replacement of left hip 06/15/2019   OSA (obstructive sleep apnea) 02/13/2018   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 01/03/2018   Family history of prostate cancer 01/02/2018   Genetic testing 12/15/2017   Primary malignant neoplasm of lower outer quadrant of female breast, left ([place]) 12/14/2017   History of therapeutic radiation 11/01/2017   Ductal carcinoma in situ (DCIS) of left breast 10/24/2017   Chest pain with moderate risk of acute coronary syndrome 11/03/2016   Atrial fibrillation with rapid ventricular response ([place]) 11/02/2016   History of invasive breast cancer 11/06/2012    Debbe Odea, PT,DPT 07/28/2021, 2:47 PM  [facility] Physical Therapy [address] [place], [place], [address] Phone: [phone]   Fax:  [phone]  Name: Linda Foster MRN: [id number] Date of Birth: [birth year]

## 2021-07-28 NOTE — Telephone Encounter (Signed)
PA still pending for SYnviscOne, right knee. Additional information needed.  Faxed completed forms (additional information) to [employer] at [phone].

## 2021-08-03 ENCOUNTER — Ambulatory Visit: Payer: BC Managed Care – PPO | Admitting: Orthopaedic Surgery

## 2021-08-03 ENCOUNTER — Telehealth: Payer: Self-pay

## 2021-08-03 ENCOUNTER — Ambulatory Visit (INDEPENDENT_AMBULATORY_CARE_PROVIDER_SITE_OTHER): Payer: BC Managed Care – PPO | Admitting: Physical Therapy

## 2021-08-03 ENCOUNTER — Encounter: Payer: Self-pay | Admitting: Physical Therapy

## 2021-08-03 ENCOUNTER — Other Ambulatory Visit: Payer: Self-pay

## 2021-08-03 DIAGNOSIS — M6281 Muscle weakness (generalized): Secondary | ICD-10-CM

## 2021-08-03 DIAGNOSIS — G8929 Other chronic pain: Secondary | ICD-10-CM

## 2021-08-03 DIAGNOSIS — M25561 Pain in right knee: Secondary | ICD-10-CM | POA: Diagnosis not present

## 2021-08-03 DIAGNOSIS — M25562 Pain in left knee: Secondary | ICD-10-CM | POA: Diagnosis not present

## 2021-08-03 DIAGNOSIS — R262 Difficulty in walking, not elsewhere classified: Secondary | ICD-10-CM

## 2021-08-03 DIAGNOSIS — M25571 Pain in right ankle and joints of right foot: Secondary | ICD-10-CM

## 2021-08-03 NOTE — Therapy (Signed)
[facility] [address] [place], [place], [address] Phone: [phone]   Fax:  [phone]  Physical Therapy Treatment  Patient Details  Name: Linda Foster MRN: [id number] Date of Birth: [birth year] Referring Provider (PT): Mcarthur Rossetti, MD   Encounter Date: 08/03/2021   PT End of Session - 08/03/21 0937     Visit Number 4    Number of Visits 12    Date for PT Re-Evaluation 08/25/21    PT Start Time 0845    PT Stop Time 0928    PT Time Calculation (min) 43 min    Activity Tolerance Patient tolerated treatment well    Behavior During Therapy [facility] for tasks assessed/performed             Past Medical History:  Diagnosis Date   Breast cancer ([place]) 2008   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   Breast cancer ([place]) 2019   left ER/PR +,  XRT and chemotherapy   Family history of adverse reaction to anesthesia    N&V   Family history of prostate cancer    Lymphedema 2013   OSA (obstructive sleep apnea)    uses a dental appliance   Osteoarthritis    Persistent atrial fibrillation ([place])    History of Afib   PONV (postoperative nausea and vomiting)     Past Surgical History:  Procedure Laterality Date   ABDOMINAL HYSTERECTOMY     ATRIAL FIBRILLATION ABLATION N/A 03/03/2021   Procedure: ATRIAL FIBRILLATION ABLATION;  Surgeon: Thompson Grayer, MD;  Location: [place] [facility];  Service: Cardiovascular;  Laterality: N/A;   BREAST LUMPECTOMY Left 2008   BREAST LUMPECTOMY WITH RADIOACTIVE SEED LOCALIZATION Left 12/05/2017   Procedure: BREAST LUMPECTOMY WITH RADIOACTIVE SEED LOCALIZATION;  Surgeon: Rolm Bookbinder, MD;  Location: [place];  Service: General;  Laterality: Left;   BREAST RECONSTRUCTION Left 2019   no BP on Lt arm   CARDIOVERSION N/A 12/01/2020   Procedure: CARDIOVERSION;  Surgeon: Larey Dresser, MD;  Location: [facility] ENDOSCOPY;  Service: Cardiovascular;  Laterality: N/A;   CESAREAN SECTION      x 3    KNEE ARTHROSCOPY Right 2007   TEE WITHOUT CARDIOVERSION N/A 12/01/2020   Procedure: TRANSESOPHAGEAL ECHOCARDIOGRAM (TEE);  Surgeon: Larey Dresser, MD;  Location: [facility] ENDOSCOPY;  Service: Cardiovascular;  Laterality: N/A;   TOTAL HIP ARTHROPLASTY Left 06/15/2019   Procedure: LEFT TOTAL HIP ARTHROPLASTY ANTERIOR APPROACH;  Surgeon: Mcarthur Rossetti, MD;  [facility]: [facility];  Service: Orthopedics;  Laterality: Left;    There were no vitals filed for this visit.   Subjective Assessment - 08/03/21 0856     Subjective Pt arriving today reporting 10/10 in right heel earlier this morning. Pt stating walking in here today pain was 5/10. Pt feels she feels better when she is in therapy but then her pain returns after she returns home. Pt does feel the patch is helping overall pain hours after removal and even some into the next day.    Currently in Pain? Yes    Pain Score 5                                [facility] Adult PT Treatment/Exercise - 08/03/21 0001       Exercises   Exercises Ankle      Knee/Hip Exercises: Aerobic   Recumbent Bike L4 x 5 minutes      Knee/Hip Exercises:  Standing   Heel Raises Both;15 reps;3 seconds;Limitations    Heel Raises Limitations heel and toe raises    Rocker Board 2 minutes    Rocker Board Limitations A-P with intermit UE support      Iontophoresis   Type of Iontophoresis Dexamethasone    Location Rt heel    Dose 1.0 CC    Time 4-6 hour wear home patch      Manual Therapy   Manual therapy comments IASTM to gastroc/soleus, achiles tendon and plantar surface of right foot.   15 minutes     Ankle Exercises: Stretches   Other Stretch standing lunge with towel roll under right mid foot, 3x 30 seconds gastroc stretch, 3x 30 seconds soleus stretch                     PT Education - 08/03/21 0924     Education Details Discussed DN option for gastroc, soleus and heel for improved flexibility to relieve active trigger  points.    Person(s) Educated Patient    Methods Explanation    Comprehension Verbalized understanding              PT Short Term Goals - 07/14/21 1319       PT SHORT TERM GOAL #1   Title Pt will be I and compliant with HEP    Time 4    Period Weeks    Status New    Target Date 08/11/21               PT Long Term Goals - 07/14/21 1319       PT LONG TERM GOAL #1   Title Pt will improve FOTO functional score to 69%    Time 6    Period Weeks    Status New      PT LONG TERM GOAL #2   Title Pt will report less overall pain with squatting, walking, stairs    Time 6    Period Weeks    Status New      PT LONG TERM GOAL #3   Title She will show >10 degrees Rt ankle DF ROM and 3-110 degrees Rt knee flexion ROM for functional mobility    Time 6    Period Weeks    Status New      PT LONG TERM GOAL #4   Title She will show grossly 5/5 MMT for bilat hip/knee/ankle strength tested in sitting and supine for improved functional strength.    Time 6    Period Weeks    Status New                   Plan - 08/03/21 8563     Clinical Impression Statement Pt reporting 5/10 pain when entering the clinic this morning. Pt however reported 10/10 pain when first waking up in her right heel/achilles tendon. Pt with acctive trigger points noted in gastroc/soleus complex. We discussed DN and pt wanted to think about the treatment and will consider at next visits. DN handout was issued to pt. Pt with good tolerance to IASTM to right gastroc/soleus and achilles tendon. Ionto patch placed at distal achilles tendon. Continue skilled PT to maximie pt's function.    Personal Factors and Comorbidities Comorbidity 3+    Comorbidities JSH:FWYOV knee OA, Rt knee scope 2007,Lt THA,breast CA,Afib    Examination-Activity Limitations Stand;Stairs;Squat;Locomotion Level    Examination-Participation Restrictions Cleaning;Community Activity;Shop    Stability/Clinical Decision Making  Evolving/Moderate complexity  Rehab Potential Good    PT Frequency 2x / week    PT Duration 6 weeks    PT Treatment/Interventions Cryotherapy;Electrical Stimulation;Iontophoresis 4mg /ml Dexamethasone;Moist Heat;Ultrasound;Gait training;Stair training;Therapeutic activities;Therapeutic exercise;Neuromuscular re-education;Manual techniques;Passive range of motion;Dry needling;Joint Manipulations;Vasopneumatic Device;Taping    PT Next Visit Plan DN at next visit if pt wishes, Quadriceps strength, eccentric plantar flexors strength,stretching achillies, ionto to Achilles if desired    PT Home Exercise Plan Access Code: [email or web address]    Consulted and Agree with Plan of Care Patient             Patient will benefit from skilled therapeutic intervention in order to improve the following deficits and impairments:  Decreased activity tolerance, Decreased range of motion, Decreased strength, Difficulty walking, Impaired flexibility, Pain  Visit Diagnosis: Difficulty in walking, not elsewhere classified  Muscle weakness (generalized)  Chronic pain of right knee  Chronic pain of left knee  Pain in right ankle and joints of right foot     Problem List Patient Active Problem List   Diagnosis Date Noted   Paroxysmal atrial fibrillation ([place]) 11/25/2020   Unilateral primary osteoarthritis, left hip 06/15/2019   Status post total replacement of left hip 06/15/2019   OSA (obstructive sleep apnea) 02/13/2018   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 01/03/2018   Family history of prostate cancer 01/02/2018   Genetic testing 12/15/2017   Primary malignant neoplasm of lower outer quadrant of female breast, left ([place]) 12/14/2017   History of therapeutic radiation 11/01/2017   Ductal carcinoma in situ (DCIS) of left breast 10/24/2017   Chest pain with moderate risk of acute coronary syndrome 11/03/2016   Atrial fibrillation with rapid ventricular response ([place]) 11/02/2016    History of invasive breast cancer 11/06/2012    Oretha Caprice, PT, MPT 08/03/2021, 9:47 AM  [facility] [address] [place], [place], [address] Phone: [phone]   Fax:  [phone]  Name: Linda Foster MRN: [id number] Date of Birth: 11-Aug-1956

## 2021-08-03 NOTE — Telephone Encounter (Signed)
Tried calling patient concerning her appointment for gel injection today.

## 2021-08-05 ENCOUNTER — Encounter: Payer: Self-pay | Admitting: Rehabilitative and Restorative Service Providers"

## 2021-08-05 ENCOUNTER — Ambulatory Visit (INDEPENDENT_AMBULATORY_CARE_PROVIDER_SITE_OTHER): Payer: BC Managed Care – PPO | Admitting: Rehabilitative and Restorative Service Providers"

## 2021-08-05 ENCOUNTER — Other Ambulatory Visit: Payer: Self-pay

## 2021-08-05 ENCOUNTER — Telehealth: Payer: Self-pay

## 2021-08-05 DIAGNOSIS — M25561 Pain in right knee: Secondary | ICD-10-CM

## 2021-08-05 DIAGNOSIS — M6281 Muscle weakness (generalized): Secondary | ICD-10-CM

## 2021-08-05 DIAGNOSIS — G8929 Other chronic pain: Secondary | ICD-10-CM

## 2021-08-05 DIAGNOSIS — R262 Difficulty in walking, not elsewhere classified: Secondary | ICD-10-CM

## 2021-08-05 DIAGNOSIS — M25571 Pain in right ankle and joints of right foot: Secondary | ICD-10-CM | POA: Diagnosis not present

## 2021-08-05 NOTE — Patient Instructions (Signed)
Encouraged Nevin Bloodgood to focus on heel raises (work up to 100/day), upper and lower heel cords stretch (2-3X/day) and seated straight leg raises (30-50/Day) with her HEP.

## 2021-08-05 NOTE — Therapy (Signed)
[facility] Physical Therapy [address] [place], [place], [address] Phone: [phone]   Fax:  [phone]  Physical Therapy Treatment  Patient Details  Name: Linda Foster MRN: [id number] Date of Birth: 16-May-1956 Referring Provider (PT): Mcarthur Rossetti, MD   Encounter Date: 08/05/2021   PT End of Session - 08/05/21 1212     Visit Number 5    Number of Visits 12    Date for PT Re-Evaluation 08/25/21    PT Start Time 2694    PT Stop Time 1059    PT Time Calculation (min) 43 min    Activity Tolerance Patient tolerated treatment well    Behavior During Therapy [facility] for tasks assessed/performed             Past Medical History:  Diagnosis Date   Breast cancer ([place]) 2008   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   Breast cancer ([place]) 2019   left ER/PR +,  XRT and chemotherapy   Family history of adverse reaction to anesthesia    N&V   Family history of prostate cancer    Lymphedema 2013   OSA (obstructive sleep apnea)    uses a dental appliance   Osteoarthritis    Persistent atrial fibrillation ([place])    History of Afib   PONV (postoperative nausea and vomiting)     Past Surgical History:  Procedure Laterality Date   ABDOMINAL HYSTERECTOMY     ATRIAL FIBRILLATION ABLATION N/A 03/03/2021   Procedure: ATRIAL FIBRILLATION ABLATION;  Surgeon: Thompson Grayer, MD;  Location: [place] [facility];  Service: Cardiovascular;  Laterality: N/A;   BREAST LUMPECTOMY Left 2008   BREAST LUMPECTOMY WITH RADIOACTIVE SEED LOCALIZATION Left 12/05/2017   Procedure: BREAST LUMPECTOMY WITH RADIOACTIVE SEED LOCALIZATION;  Surgeon: Rolm Bookbinder, MD;  Location: [place];  Service: General;  Laterality: Left;   BREAST RECONSTRUCTION Left 2019   no BP on Lt arm   CARDIOVERSION N/A 12/01/2020   Procedure: CARDIOVERSION;  Surgeon: Larey Dresser, MD;  Location: [facility] ENDOSCOPY;  Service: Cardiovascular;  Laterality: N/A;   CESAREAN SECTION      x 3    KNEE ARTHROSCOPY Right 2007   TEE WITHOUT CARDIOVERSION N/A 12/01/2020   Procedure: TRANSESOPHAGEAL ECHOCARDIOGRAM (TEE);  Surgeon: Larey Dresser, MD;  Location: [facility] ENDOSCOPY;  Service: Cardiovascular;  Laterality: N/A;   TOTAL HIP ARTHROPLASTY Left 06/15/2019   Procedure: LEFT TOTAL HIP ARTHROPLASTY ANTERIOR APPROACH;  Surgeon: Mcarthur Rossetti, MD;  [facility];  Service: Orthopedics;  Laterality: Left;    There were no vitals filed for this visit.   Subjective Assessment - 08/05/21 1206     Subjective Linda Foster notes feeling less pain over the past 2 days.  Her heel cords are very tight and her WB endurance limited.    Currently in Pain? Yes    Pain Score 3     Pain Location Knee    Pain Orientation Right    Pain Descriptors / Indicators Aching;Tightness    Pain Type Chronic pain    Pain Radiating Towards Knee OA and R Achilles pain    Pain Onset More than a month ago    Pain Frequency Intermittent    Aggravating Factors  Prolonged WB    Pain Relieving Factors Change of position, taking a break    Effect of Pain on Daily Activities Has to take breaks when WB for longer periods of time    Multiple Pain Sites No  [facility] PT Assessment - 08/05/21 0001       ROM / Strength   AROM / PROM / Strength AROM;Strength      AROM   Overall AROM  Deficits    AROM Assessment Site Ankle    Right/Left Ankle Left;Right    Right Ankle Dorsiflexion 5    Left Ankle Dorsiflexion 7      Strength   Overall Strength Deficits    Strength Assessment Site Ankle    Right/Left Ankle Left;Right    Right Ankle Inversion --   27.2 pounds   Right Ankle Eversion --   31.6 pounds   Left Ankle Inversion --   24.2 pounds   Left Ankle Eversion --   21.0 pounds                          [facility] Adult PT Treatment/Exercise - 08/05/21 0001       Exercises   Exercises Ankle;Knee/Hip      Knee/Hip Exercises: Standing   Heel Raises Both;10 reps;3  seconds;Limitations    Heel Raises Limitations slow eccentrics and shift most weight to R side    Other Standing Knee Exercises Upper and lower slant board stretch (knees straight and bent) 3X each 60 seconds      Knee/Hip Exercises: Seated   Long Arc Quad Strengthening;Both;3 sets;5 reps;Limitations    Long Arc Quad Limitations Seated straight leg raises (toes back, press down and tighten thigh, pause 2 seconds, raise 6-8 inches, slowly lower)    Sit to [employer] 5 reps;with UE support      Manual Therapy   Manual therapy comments compression to medial and lateral gastroc head Rt leg              Trigger Point Dry Needling - 08/05/21 0001     Consent Given? Yes    Education Handout Provided Previously provided    Muscles Treated Lower Quadrant Gastrocnemius   medial and lateral head Rt   Gastrocnemius Response Twitch response elicited   mild response, no referred pain to heel noted                  PT Education - 08/05/21 1210     Education Details Renewed emphasis on heel cords stretching and strengthening and quadriceps strengthening with her HEP.    Person(s) Educated Patient    Methods Explanation;Tactile cues;Demonstration;Verbal cues    Comprehension Verbalized understanding;Tactile cues required;Returned demonstration;Need further instruction;Verbal cues required              PT Short Term Goals - 07/14/21 1319       PT SHORT TERM GOAL #1   Title Pt will be I and compliant with HEP    Time 4    Period Weeks    Status New    Target Date 08/11/21               PT Long Term Goals - 07/14/21 1319       PT LONG TERM GOAL #1   Title Pt will improve FOTO functional score to 69%    Time 6    Period Weeks    Status New      PT LONG TERM GOAL #2   Title Pt will report less overall pain with squatting, walking, stairs    Time 6    Period Weeks    Status New      PT LONG TERM GOAL #3  Title She will show >10 degrees Rt ankle DF ROM and 3-110  degrees Rt knee flexion ROM for functional mobility    Time 6    Period Weeks    Status New      PT LONG TERM GOAL #4   Title She will show grossly 5/5 MMT for bilat hip/knee/ankle strength tested in sitting and supine for improved functional strength.    Time 6    Period Weeks    Status New                   Plan - 08/05/21 1212     Clinical Impression Statement Dyani will benefit from continued emphasis on quadriceps strengthening to help with her arthritic knee pain.  Achilles and heel pain will benefit from continued heel raises and heel cords stretching.  Dry needling will also be used to try to reduce pain.  Continue current plan to address remaining AROM, strength and functional impairments.    Personal Factors and Comorbidities Comorbidity 3+    Comorbidities ZOX:WRUEA knee OA, Rt knee scope 2007, Lt THA,breast CA, Afib    Examination-Activity Limitations Stand;Stairs;Squat;Locomotion Level    Examination-Participation Restrictions Cleaning;Community Activity;Shop    Stability/Clinical Decision Making Evolving/Moderate complexity    Rehab Potential Good    PT Frequency 2x / week    PT Duration 6 weeks    PT Treatment/Interventions Cryotherapy;Electrical Stimulation;Iontophoresis 4mg /ml Dexamethasone;Moist Heat;Ultrasound;Gait training;Stair training;Therapeutic activities;Therapeutic exercise;Neuromuscular re-education;Manual techniques;Passive range of motion;Dry needling;Joint Manipulations;Vasopneumatic Device;Taping    PT Next Visit Plan DN at next visit if pt wishes, Quadriceps strength, eccentric plantar flexors strength, stretching achillies (upper and lower), ionto to Achilles if desired    PT Home Exercise Plan Access Code: [email or web address]    Consulted and Agree with Plan of Care Patient             Patient will benefit from skilled therapeutic intervention in order to improve the following deficits and impairments:  Decreased activity tolerance, Decreased range  of motion, Decreased strength, Difficulty walking, Impaired flexibility, Pain  Visit Diagnosis: Difficulty in walking, not elsewhere classified  Muscle weakness (generalized)  Chronic pain of right knee  Pain in right ankle and joints of right foot     Problem List Patient Active Problem List   Diagnosis Date Noted   Paroxysmal atrial fibrillation ([place]) 11/25/2020   Unilateral primary osteoarthritis, left hip 06/15/2019   Status post total replacement of left hip 06/15/2019   OSA (obstructive sleep apnea) 02/13/2018   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 [place], [place], [address] Phone: [phone]   Fax:  [phone]  Name: LANAYA BENNIS MRN: [id number] Date of Birth: 11/21/1955

## 2021-08-05 NOTE — Telephone Encounter (Signed)
Approved for SynviscOne, right knee Buy & Bill Covered at 100% of the allowed amount No Co-pay [place] Approval# [id number] Valid 07/20/2021- 01/15/2022  Appt. 08/10/2021

## 2021-08-10 ENCOUNTER — Encounter: Payer: Self-pay | Admitting: Physician Assistant

## 2021-08-10 ENCOUNTER — Ambulatory Visit (INDEPENDENT_AMBULATORY_CARE_PROVIDER_SITE_OTHER): Payer: BC Managed Care – PPO | Admitting: Physician Assistant

## 2021-08-10 ENCOUNTER — Other Ambulatory Visit: Payer: Self-pay

## 2021-08-10 ENCOUNTER — Ambulatory Visit (INDEPENDENT_AMBULATORY_CARE_PROVIDER_SITE_OTHER): Payer: BC Managed Care – PPO | Admitting: Physical Therapy

## 2021-08-10 DIAGNOSIS — M25561 Pain in right knee: Secondary | ICD-10-CM | POA: Diagnosis not present

## 2021-08-10 DIAGNOSIS — M6281 Muscle weakness (generalized): Secondary | ICD-10-CM

## 2021-08-10 DIAGNOSIS — M1711 Unilateral primary osteoarthritis, right knee: Secondary | ICD-10-CM

## 2021-08-10 DIAGNOSIS — R262 Difficulty in walking, not elsewhere classified: Secondary | ICD-10-CM

## 2021-08-10 DIAGNOSIS — M25571 Pain in right ankle and joints of right foot: Secondary | ICD-10-CM | POA: Diagnosis not present

## 2021-08-10 DIAGNOSIS — G8929 Other chronic pain: Secondary | ICD-10-CM

## 2021-08-10 DIAGNOSIS — M25562 Pain in left knee: Secondary | ICD-10-CM

## 2021-08-10 MED ORDER — HYLAN G-F 20 48 MG/6ML IX SOSY
48.0000 mg | PREFILLED_SYRINGE | INTRA_ARTICULAR | Status: AC | PRN
  Administered 2021-08-10: 48 mg via INTRA_ARTICULAR

## 2021-08-10 MED ORDER — LIDOCAINE HCL 1 % IJ SOLN
3.0000 mL | INTRAMUSCULAR | Status: AC | PRN
  Administered 2021-08-10: 09:00:00 3 mL

## 2021-08-10 NOTE — Progress Notes (Signed)
   Procedure Note  Patient: Linda Foster             Date of Birth: 11/22/1955           MRN: [id number]             Visit Date: 08/10/2021 HPI: Linda Foster returns today for Synvisc 1 injection right knee.  She has had no new injury to the right knee.  She has known osteoarthritis right knee.  She does currently on Eliquis and reports that she takes naproxen and ibuprofen to control her pain.  She has no planned surgery on the right knee in the next 6 months.  Physical exam: Right knee full extension flexion beyond 90 degrees.  Patellofemoral crepitus with passive range of motion.  No abnormal warmth erythema or effusion right knee.  Procedures: Visit Diagnoses:  1. Unilateral primary osteoarthritis, right knee     Large Joint Inj: R knee on 08/10/2021 8:31 AM Indications: pain Details: 22 G 1.5 in needle, superolateral approach  Arthrogram: No  Medications: 3 mL lidocaine 1 %; 48 mg Hylan 48 MG/6ML Outcome: tolerated well, no immediate complications Procedure, treatment alternatives, risks and benefits explained, specific risks discussed. Consent was given by the patient. Immediately prior to procedure a time out was called to verify the correct patient, procedure, equipment, support staff and site/side marked as required. Patient was prepped and draped in the usual sterile fashion.     Plan: Encourage her to take Tylenol for pain discontinued NSAIDs as she has on Eliquis.  Follow-up with [place] in 8 weeks to see how she is doing overall.  She will continue therapy for her heel and the right knee.  Questions were encouraged and answered.

## 2021-08-10 NOTE — Therapy (Signed)
[facility] Physical Therapy [address] [place], [place], [address] Phone: [phone]   Fax:  [phone]  Physical Therapy Treatment  Patient Details  Name: Linda Foster MRN: [id number] Date of Birth: 29-Nov-1955 Referring Provider (PT): Mcarthur Rossetti, MD   Encounter Date: 08/10/2021   PT End of Session - 08/10/21 1110     Visit Number 6    Number of Visits 12    Date for PT Re-Evaluation 08/25/21    PT Start Time 8242    PT Stop Time 1100    PT Time Calculation (min) 45 min    Activity Tolerance Patient tolerated treatment well    Behavior During Therapy [facility] for tasks assessed/performed             Past Medical History:  Diagnosis Date   Breast cancer ([place]) 2008   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   Breast cancer ([place]) 2019   left ER/PR +,  XRT and chemotherapy   Family history of adverse reaction to anesthesia    N&V   Family history of prostate cancer    Lymphedema 2013   OSA (obstructive sleep apnea)    uses a dental appliance   Osteoarthritis    Persistent atrial fibrillation ([place])    History of Afib   PONV (postoperative nausea and vomiting)     Past Surgical History:  Procedure Laterality Date   ABDOMINAL HYSTERECTOMY     ATRIAL FIBRILLATION ABLATION N/A 03/03/2021   Procedure: ATRIAL FIBRILLATION ABLATION;  Surgeon: Thompson Grayer, MD;  Location: [place] [facility];  Service: Cardiovascular;  Laterality: N/A;   BREAST LUMPECTOMY Left 2008   BREAST LUMPECTOMY WITH RADIOACTIVE SEED LOCALIZATION Left 12/05/2017   Procedure: BREAST LUMPECTOMY WITH RADIOACTIVE SEED LOCALIZATION;  Surgeon: Rolm Bookbinder, MD;  Location: [place];  Service: General;  Laterality: Left;   BREAST RECONSTRUCTION Left 2019   no BP on Lt arm   CARDIOVERSION N/A 12/01/2020   Procedure: CARDIOVERSION;  Surgeon: Larey Dresser, MD;  Location: [facility] ENDOSCOPY;  Service: Cardiovascular;  Laterality: N/A;   CESAREAN SECTION      x 3    KNEE ARTHROSCOPY Right 2007   TEE WITHOUT CARDIOVERSION N/A 12/01/2020   Procedure: TRANSESOPHAGEAL ECHOCARDIOGRAM (TEE);  Surgeon: Larey Dresser, MD;  Location: [facility] ENDOSCOPY;  Service: Cardiovascular;  Laterality: N/A;   TOTAL HIP ARTHROPLASTY Left 06/15/2019   Procedure: LEFT TOTAL HIP ARTHROPLASTY ANTERIOR APPROACH;  Surgeon: Mcarthur Rossetti, MD;  [facility];  Service: Orthopedics;  Laterality: Left;    There were no vitals filed for this visit.   Subjective Assessment - 08/10/21 1109     Subjective relays she just had knee gel injection today so MD wants her to keep her knee moving today. She says she was able to wake up and stand without any pain in her heel and that was the first time in a year this has happened for her so she was very excited for this.    Pain Onset More than a month ago                               [facility] Adult PT Treatment/Exercise - 08/10/21 0001       Knee/Hip Exercises: Stretches   Gastroc Stretch Both;2 reps;30 seconds    Gastroc Stretch Limitations slantboard    Soleus Stretch Both;2 reps;30 seconds    Soleus Stretch Limitations slantboard  Other Knee/Hip Stretches long sitting calf stretch with strap 30 sec X 2 after DN and manual therapy at end      Knee/Hip Exercises: Aerobic   Recumbent Bike L2 X 8 min      Knee/Hip Exercises: Machines for Strengthening   Cybex Leg Press DL 75# 2X15      Knee/Hip Exercises: Standing   Heel Raises Both;15 reps    Heel Raises Limitations heel and toe raises      Knee/Hip Exercises: Seated   Long Arc Quad Weight 5 lbs.    Long [employer] Limitations 2X15 bilat      Manual Therapy   Manual therapy comments compression with DN to medial and lateral gastroc head Rt leg followed by IASTM to gastroc/soleus, achiles tendon and plantar surface of right foot              Trigger Point Dry Needling - 08/10/21 0001     Consent Given? Yes    Education Handout Provided  Previously provided    Dry Needling Comments gastroc and soleus with good tolerance and twitch response                     PT Short Term Goals - 07/14/21 1319       PT SHORT TERM GOAL #1   Title Pt will be I and compliant with HEP    Time 4    Period Weeks    Status New    Target Date 08/11/21               PT Long Term Goals - 07/14/21 1319       PT LONG TERM GOAL #1   Title Pt will improve FOTO functional score to 69%    Time 6    Period Weeks    Status New      PT LONG TERM GOAL #2   Title Pt will report less overall pain with squatting, walking, stairs    Time 6    Period Weeks    Status New      PT LONG TERM GOAL #3   Title She will show >10 degrees Rt ankle DF ROM and 3-110 degrees Rt knee flexion ROM for functional mobility    Time 6    Period Weeks    Status New      PT LONG TERM GOAL #4   Title She will show grossly 5/5 MMT for bilat hip/knee/ankle strength tested in sitting and supine for improved functional strength.    Time 6    Period Weeks    Status New                   Plan - 08/10/21 1112     Clinical Impression Statement She appears to be responding well to DN and manual therapy so this was continued and she denies needing ionto patch due to the fact that she is feeling much better. Continue POC    Personal Factors and Comorbidities Comorbidity 3+    Comorbidities RDE:YCXKG knee OA, Rt knee scope 2007, Lt THA,breast CA, Afib    Examination-Activity Limitations Stand;Stairs;Squat;Locomotion Level    Examination-Participation Restrictions Cleaning;Community Activity;Shop    Stability/Clinical Decision Making Evolving/Moderate complexity    Rehab Potential Good    PT Frequency 2x / week    PT Duration 6 weeks    PT Treatment/Interventions Cryotherapy;Electrical Stimulation;Iontophoresis 4mg /ml Dexamethasone;Moist Heat;Ultrasound;Gait training;Stair training;Therapeutic activities;Therapeutic exercise;Neuromuscular  re-education;Manual techniques;Passive range of motion;Dry needling;Joint  Manipulations;Vasopneumatic Device;Taping    PT Next Visit Plan DN at next visit if pt wishes, Quadriceps strength, eccentric plantar flexors strength, stretching achillies (upper and lower),    PT Home Exercise Plan Access Code: [email or web address]    Consulted and Agree with Plan of Care Patient             Patient will benefit from skilled therapeutic intervention in order to improve the following deficits and impairments:  Decreased activity tolerance, Decreased range of motion, Decreased strength, Difficulty walking, Impaired flexibility, Pain  Visit Diagnosis: Difficulty in walking, not elsewhere classified  Muscle weakness (generalized)  Chronic pain of right knee  Pain in right ankle and joints of right foot  Chronic pain of left knee     Problem List Patient Active Problem List   Diagnosis Date Noted   Paroxysmal atrial fibrillation ([place]) 11/25/2020   Unilateral primary osteoarthritis, left hip 06/15/2019   Status post total replacement of left hip 06/15/2019   OSA (obstructive sleep apnea) 02/13/2018   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 01/03/2018   Family history of prostate cancer 01/02/2018   Genetic testing 12/15/2017   Primary malignant neoplasm of lower outer quadrant of female breast, left ([place]) 12/14/2017   History of therapeutic radiation 11/01/2017   Ductal carcinoma in situ (DCIS) of left breast 10/24/2017   Chest pain with moderate risk of acute coronary syndrome 11/03/2016   Atrial fibrillation with rapid ventricular response ([place]) 11/02/2016   History of invasive breast cancer 11/06/2012    Debbe Odea, PT,DPT 08/10/2021, 11:13 AM  [facility] Physical Therapy [address] [place], [place], [address] Phone: [phone]   Fax:  [phone]  Name: Linda Foster MRN: [id number] Date of Birth: 08-19-1956

## 2021-08-12 ENCOUNTER — Ambulatory Visit (INDEPENDENT_AMBULATORY_CARE_PROVIDER_SITE_OTHER): Payer: BC Managed Care – PPO | Admitting: Physical Therapy

## 2021-08-12 ENCOUNTER — Other Ambulatory Visit: Payer: Self-pay

## 2021-08-12 DIAGNOSIS — M6281 Muscle weakness (generalized): Secondary | ICD-10-CM

## 2021-08-12 DIAGNOSIS — M25562 Pain in left knee: Secondary | ICD-10-CM

## 2021-08-12 DIAGNOSIS — R262 Difficulty in walking, not elsewhere classified: Secondary | ICD-10-CM | POA: Diagnosis not present

## 2021-08-12 DIAGNOSIS — M25571 Pain in right ankle and joints of right foot: Secondary | ICD-10-CM

## 2021-08-12 DIAGNOSIS — G8929 Other chronic pain: Secondary | ICD-10-CM

## 2021-08-12 DIAGNOSIS — M25561 Pain in right knee: Secondary | ICD-10-CM

## 2021-08-12 NOTE — Therapy (Addendum)
[facility] Physical Therapy [address] [place], [place], [address] Phone: [phone]   Fax:  [phone]  Physical Therapy Treatment/Discharge addendum PHYSICAL THERAPY DISCHARGE SUMMARY  Visits from Start of Care: 7  Current functional level related to goals / functional outcomes: See below   Remaining deficits: See below   Education / Equipment: HEP Plan:  Patient goals were partially met. Patient is being discharged due to not returning since last PT visit.      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   Breast cancer ([place]) 2019   left ER/PR +,  XRT and chemotherapy   Family history of adverse reaction to anesthesia    N&V   Family history of prostate cancer    Lymphedema 2013   OSA (obstructive sleep apnea)    uses a dental appliance   Osteoarthritis    Persistent atrial fibrillation ([place])    History of Afib   PONV (postoperative nausea and vomiting)     Past Surgical History:  Procedure Laterality Date   ABDOMINAL HYSTERECTOMY     ATRIAL FIBRILLATION ABLATION N/A 03/03/2021   Procedure: ATRIAL FIBRILLATION ABLATION;  Surgeon: Thompson Grayer, MD;  Location: [place] [facility];  Service: Cardiovascular;  Laterality: N/A;   BREAST LUMPECTOMY Left 2008   BREAST LUMPECTOMY WITH RADIOACTIVE SEED LOCALIZATION Left 12/05/2017   Procedure: BREAST LUMPECTOMY WITH RADIOACTIVE SEED LOCALIZATION;   Surgeon: Rolm Bookbinder, MD;  Location: [place];  Service: General;  Laterality: Left;   BREAST RECONSTRUCTION Left 2019   no BP on Lt arm   CARDIOVERSION N/A 12/01/2020   Procedure: CARDIOVERSION;  Surgeon: Larey Dresser, MD;  Location: [facility] ENDOSCOPY;  Service: Cardiovascular;  Laterality: N/A;   CESAREAN SECTION      x 3   KNEE ARTHROSCOPY Right 2007   TEE WITHOUT CARDIOVERSION N/A 12/01/2020   Procedure: TRANSESOPHAGEAL ECHOCARDIOGRAM (TEE);  Surgeon: Larey Dresser, MD;  Location: [facility] ENDOSCOPY;  Service: Cardiovascular;  Laterality: N/A;   TOTAL HIP ARTHROPLASTY Left 06/15/2019   Procedure: LEFT TOTAL HIP ARTHROPLASTY ANTERIOR APPROACH;  Surgeon: Mcarthur Rossetti, MD;  [facility];  Service: Orthopedics;  Laterality: Left;    There were no vitals filed for this visit.   Subjective Assessment - 08/12/21 1443     Subjective relays mild pain today but overall it is better than it has been.    Pain Onset More than a month ago                               [facility] Adult PT Treatment/Exercise - 08/12/21 0001       Knee/Hip Exercises: Stretches   Gastroc Stretch Both;3 reps;30 seconds    Soleus Stretch Both;3 reps;30 seconds  Knee/Hip Exercises: Aerobic   Nustep L6 x10 min      Knee/Hip Exercises: Standing   Heel Raises Both;20 reps    Heel Raises Limitations slow eccentrics      Knee/Hip Exercises: Seated   Long Arc Quad Weight 5 lbs.    Long [employer] Limitations 2X15 bilat      Manual Therapy   Manual therapy comments compression with DN to medial and lateral gastroc head Rt leg followed by IASTM to gastroc/soleus, achiles tendon and plantar surface of right foot              Trigger Point Dry Needling - 08/12/21 0001     Consent Given? Yes    Education Handout Provided Previously provided    Dry Needling Comments gastroc and soleus with good tolerance and twitch response                      PT Short Term Goals - 07/14/21 1319       PT SHORT TERM GOAL #1   Title Pt will be I and compliant with HEP    Time 4    Period Weeks    Status New    Target Date 08/11/21               PT Long Term Goals - 07/14/21 1319       PT LONG TERM GOAL #1   Title Pt will improve FOTO functional score to 69%    Time 6    Period Weeks    Status New      PT LONG TERM GOAL #2   Title Pt will report less overall pain with squatting, walking, stairs    Time 6    Period Weeks    Status New      PT LONG TERM GOAL #3   Title She will show >10 degrees Rt ankle DF ROM and 3-110 degrees Rt knee flexion ROM for functional mobility    Time 6    Period Weeks    Status New      PT LONG TERM GOAL #4   Title She will show grossly 5/5 MMT for bilat hip/knee/ankle strength tested in sitting and supine for improved functional strength.    Time 6    Period Weeks    Status New                   Plan - 08/12/21 1547     Clinical Impression Statement Continued with DN and manual therapy combined with stretching and strengthening program. She will change insurances tommorow so she will check to see if she what she would have to pay to continue with PT and then will schedule appointments accordingly.    Personal Factors and Comorbidities Comorbidity 3+    Comorbidities ZOX:WRUEA knee OA, Rt knee scope 2007, Lt THA,breast CA, Afib    Examination-Activity Limitations Stand;Stairs;Squat;Locomotion Level    Examination-Participation Restrictions Cleaning;Community Activity;Shop    Stability/Clinical Decision Making Evolving/Moderate complexity    Rehab Potential Good    PT Frequency 2x / week    PT Duration 6 weeks    PT Treatment/Interventions Cryotherapy;Electrical Stimulation;Iontophoresis 17m/ml Dexamethasone;Moist Heat;Ultrasound;Gait training;Stair training;Therapeutic activities;Therapeutic exercise;Neuromuscular re-education;Manual techniques;Passive range of motion;Dry  needling;Joint Manipulations;Vasopneumatic Device;Taping    PT Next Visit Plan DN at next visit if pt wishes, Quadriceps strength, eccentric plantar flexors strength, stretching achillies (upper and lower),    PT Home Exercise Plan Access Code: [email or web address]  Consulted and Agree with Plan of Care Patient             Patient will benefit from skilled therapeutic intervention in order to improve the following deficits and impairments:  Decreased activity tolerance, Decreased range of motion, Decreased strength, Difficulty walking, Impaired flexibility, Pain  Visit Diagnosis: Difficulty in walking, not elsewhere classified  Muscle weakness (generalized)  Chronic pain of right knee  Pain in right ankle and joints of right foot  Chronic pain of left knee     Problem List Patient Active Problem List   Diagnosis Date Noted   Paroxysmal atrial fibrillation ([place]) 11/25/2020   Unilateral primary osteoarthritis, left hip 06/15/2019   Status post total replacement of left hip 06/15/2019   OSA (obstructive sleep apnea) 02/13/2018   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 [place], [place], [address] Phone: [phone]   Fax:  [phone]  Name: ORELLA CUSHMAN MRN: [id number] Date of Birth: 03-Oct-1955

## 2021-08-20 ENCOUNTER — Ambulatory Visit (INDEPENDENT_AMBULATORY_CARE_PROVIDER_SITE_OTHER): Payer: Medicare Other

## 2021-08-20 ENCOUNTER — Telehealth: Payer: Self-pay | Admitting: *Deleted

## 2021-08-20 ENCOUNTER — Other Ambulatory Visit: Payer: Self-pay

## 2021-08-20 ENCOUNTER — Ambulatory Visit (INDEPENDENT_AMBULATORY_CARE_PROVIDER_SITE_OTHER): Payer: Medicare Other | Admitting: Podiatry

## 2021-08-20 DIAGNOSIS — M7731 Calcaneal spur, right foot: Secondary | ICD-10-CM

## 2021-08-20 DIAGNOSIS — M79673 Pain in unspecified foot: Secondary | ICD-10-CM

## 2021-08-20 DIAGNOSIS — M21861 Other specified acquired deformities of right lower leg: Secondary | ICD-10-CM | POA: Diagnosis not present

## 2021-08-20 DIAGNOSIS — M7661 Achilles tendinitis, right leg: Secondary | ICD-10-CM | POA: Diagnosis not present

## 2021-08-20 DIAGNOSIS — M79604 Pain in right leg: Secondary | ICD-10-CM

## 2021-08-20 NOTE — Telephone Encounter (Signed)
Linda Foster w/ [facility] is calling to make sure order time frame is ok with the doctor, patient said that she will not be available until Jan. for scheduling. Please advise.

## 2021-08-23 NOTE — Progress Notes (Signed)
Subjective:   Patient ID: Linda Foster, female   DOB: 65 y.o.   MRN: [id number]   HPI 65 year old female presents the office today with her husband for concerns of right heel pain which is been ongoing for over a year now.  She describes discomfort in the back of the heel that radiating up.  No injury the time of onset. She said it hurts to apply pressure at times.  She is been undergoing physical therapy.  She did not think was helping but she feels that her symptoms may be somewhat improved and therapy may be taking effect.  No recent injuries that she reports.  The area is tender with pressure.  No other concerns.   Review of Systems  All other systems reviewed and are negative.  Past Medical History:  Diagnosis Date   Breast cancer ([place]) 2008   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   Breast cancer ([place]) 2019   left ER/PR +,  XRT and chemotherapy   Family history of adverse reaction to anesthesia    N&V   Family history of prostate cancer    Lymphedema 2013   OSA (obstructive sleep apnea)    uses a dental appliance   Osteoarthritis    Persistent atrial fibrillation ([place])    History of Afib   PONV (postoperative nausea and vomiting)     Past Surgical History:  Procedure Laterality Date   ABDOMINAL HYSTERECTOMY     ATRIAL FIBRILLATION ABLATION N/A 03/03/2021   Procedure: ATRIAL FIBRILLATION ABLATION;  Surgeon: Thompson Grayer, MD;  Location: [place] [facility];  Service: Cardiovascular;  Laterality: N/A;   BREAST LUMPECTOMY Left 2008   BREAST LUMPECTOMY WITH RADIOACTIVE SEED LOCALIZATION Left 12/05/2017   Procedure: BREAST LUMPECTOMY WITH RADIOACTIVE SEED LOCALIZATION;  Surgeon: Rolm Bookbinder, MD;  Location: [place];  Service: General;  Laterality: Left;   BREAST RECONSTRUCTION Left 2019   no BP on Lt arm   CARDIOVERSION N/A 12/01/2020   Procedure: CARDIOVERSION;  Surgeon: Larey Dresser, MD;  Location: [facility] ENDOSCOPY;  Service: Cardiovascular;  Laterality: N/A;    CESAREAN SECTION      x 3   KNEE ARTHROSCOPY Right 2007   TEE WITHOUT CARDIOVERSION N/A 12/01/2020   Procedure: TRANSESOPHAGEAL ECHOCARDIOGRAM (TEE);  Surgeon: Larey Dresser, MD;  Location: [facility] END[facility]COPY;  Service: Cardiovascular;  Laterality: N/A;   TOTAL HIP ARTHROPLASTY Left 06/15/2019   Procedure: LEFT TOTAL HIP ARTHROPLASTY ANTERIOR APPROACH;  Surgeon: Mcarthur Rossetti, MD;  [facility];  Service: Orthopedics;  Laterality: Left;     Current Outpatient Medications:    anastrozole (ARIMIDEX) 1 MG tablet, Take 1 tablet (1 mg total) by mouth daily., Disp: 90 tablet, Rfl: 4   apixaban (ELIQUIS) 5 MG TABS tablet, Take 1 tablet (5 mg total) by mouth 2 (two) times daily., Disp: 28 tablet, Rfl: 0   APPLE CIDER VINEGAR PO, Take 2-3 tablets by mouth daily., Disp: , Rfl:    b complex vitamins capsule, Take 1 capsule by mouth daily., Disp: , Rfl:    cholecalciferol (VITAMIN D3) 25 MCG (1000 UNIT) tablet, Take 1,000 Units by mouth daily., Disp: , Rfl:    diltiazem (CARDIZEM) 30 MG tablet, Take 1 tablet every 4 hours AS NEEDED for heart rate >100 as long as blood pressure >100. (Patient taking differently: Take 30 mg by mouth every 4 (four) hours as needed (heart rate >100 as long as blood pressure >100.).), Disp: 45 tablet, Rfl: 1   furosemide (LASIX)  40 MG tablet, Take 1 tablet (40 mg total) by mouth daily., Disp: 90 tablet, Rfl: 3   Glucosamine HCl (GLUCOSAMINE PO), Take 2 tablets by mouth every morning., Disp: , Rfl:    metoprolol tartrate (LOPRESSOR) 50 MG tablet, Take 1 tablet (50 mg total) by mouth 2 (two) times daily., Disp: 180 tablet, Rfl: 3   Multiple Vitamin tablet, Take 2 tablets by mouth daily., Disp: , Rfl:   No Known Allergies        Objective:  Physical Exam  General: AAO x3, NAD  Dermatological: Skin is warm, dry and supple bilateral.  There are no open sores, no preulcerative lesions, no rash or signs of infection present.  Vascular: Dorsalis Pedis artery and  Posterior Tibial artery pedal pulses are 2/4 bilateral with immedate capillary fill time.   Neruologic: Grossly intact via light touch bilateral.  Negative Tinel sign.  Musculoskeletal: There is discomfort of the posterior aspect calcaneus at the insertion of the Achilles tendon on the right side.  Also tenderness on the course of the Achilles tendon but she also gets tenderness and pain along the right calf.  There is mild edema present to the Achilles but there is no erythema or warmth.  Clinically the tendon appears to be intact.  She also tested tenderness on the course of the peroneal tendons, medial flexor tendons.  Muscular strength 5/5 in all groups tested bilateral. Equinus is present.  Gait: Unassisted, Nonantalgic.       Assessment:   65 year old female with Achilles tendinitis, heel spur; calf pain     Plan:  -Treatment options discussed including all alternatives, risks, and complications -Etiology of symptoms were discussed -X-rays were obtained and reviewed with the patient.  Posterior calcaneal spurring is present. -Given the ongoing nature of her symptoms and continued pain we will order an MRI of the right ankle to rule out any tendon tear, other tendon abnormality. -Given calf pain order venous duplex to rule out DVT -Medrol dose pack -Night splint -Heel lift -Would recommend continue with physical therapy.  She is to wait till after the MRI for fully to this.  She is likely can wait till January to have the MRI given insurance.  Trula Slade DPM

## 2021-08-24 NOTE — Telephone Encounter (Signed)
Patient has been scheduled 08/25/21 @8 :00.

## 2021-08-25 ENCOUNTER — Other Ambulatory Visit: Payer: Self-pay

## 2021-08-25 ENCOUNTER — Telehealth: Payer: Self-pay | Admitting: *Deleted

## 2021-08-25 ENCOUNTER — Ambulatory Visit (HOSPITAL_COMMUNITY)
Admission: RE | Admit: 2021-08-25 | Discharge: 2021-08-25 | Disposition: A | Discharge status: Home / Self Care | Payer: Medicare Other | Source: Ambulatory Visit | Attending: Podiatry | Admitting: Podiatry

## 2021-08-25 DIAGNOSIS — M7661 Achilles tendinitis, right leg: Secondary | ICD-10-CM | POA: Diagnosis present

## 2021-08-25 NOTE — Telephone Encounter (Signed)
[facility] calling for Prelim. Report: is negative for right lower extremity DVT.Any questions , please call [phone].

## 2021-09-01 ENCOUNTER — Encounter: Payer: Self-pay | Admitting: Internal Medicine

## 2021-09-03 ENCOUNTER — Other Ambulatory Visit (HOSPITAL_COMMUNITY): Payer: BC Managed Care – PPO

## 2021-09-08 ENCOUNTER — Ambulatory Visit: Payer: BC Managed Care – PPO | Admitting: Internal Medicine

## 2021-09-15 ENCOUNTER — Ambulatory Visit (HOSPITAL_COMMUNITY): Payer: Medicare Other | Attending: Cardiovascular Disease

## 2021-09-15 ENCOUNTER — Other Ambulatory Visit: Payer: Self-pay

## 2021-09-15 DIAGNOSIS — G4733 Obstructive sleep apnea (adult) (pediatric): Secondary | ICD-10-CM | POA: Diagnosis present

## 2021-09-15 DIAGNOSIS — I4819 Other persistent atrial fibrillation: Secondary | ICD-10-CM | POA: Insufficient documentation

## 2021-09-15 LAB — ECHOCARDIOGRAM COMPLETE
Area-P 1/2: 3.78 cm2
S' Lateral: 3 cm

## 2021-09-17 ENCOUNTER — Other Ambulatory Visit: Payer: Self-pay

## 2021-09-17 ENCOUNTER — Ambulatory Visit (INDEPENDENT_AMBULATORY_CARE_PROVIDER_SITE_OTHER): Payer: Medicare Other | Admitting: Internal Medicine

## 2021-09-17 ENCOUNTER — Ambulatory Visit
Admission: RE | Admit: 2021-09-17 | Discharge: 2021-09-17 | Disposition: A | Discharge status: Home / Self Care | Payer: Medicare Other | Source: Ambulatory Visit | Attending: Podiatry | Admitting: Podiatry

## 2021-09-17 ENCOUNTER — Encounter: Payer: Self-pay | Admitting: Internal Medicine

## 2021-09-17 VITALS — BP 106/62 | HR 75 | Ht 65.5 in | Wt 214.0 lb

## 2021-09-17 DIAGNOSIS — I4819 Other persistent atrial fibrillation: Secondary | ICD-10-CM | POA: Diagnosis not present

## 2021-09-17 DIAGNOSIS — M7661 Achilles tendinitis, right leg: Secondary | ICD-10-CM

## 2021-09-17 DIAGNOSIS — G4733 Obstructive sleep apnea (adult) (pediatric): Secondary | ICD-10-CM

## 2021-09-17 NOTE — Patient Instructions (Addendum)
Medication Instructions:  Stop Eliquis Your physician recommends that you continue on your current medications as directed. Please refer to the Current Medication list given to you today. *If you need a refill on your cardiac medications before your next appointment, please call your pharmacy*  Lab Work: None. If you have labs (blood work) drawn today and your tests are completely normal, you will receive your results only by: [place] (if you have MyChart) OR A paper copy in the mail If you have any lab test that is abnormal or we need to change your treatment, we will call you to review the results.  Testing/Procedures: None.  Follow-Up: At [facility], you and your health needs are our priority.  As part of our continuing mission to provide you with exceptional heart care, we have created designated Provider Care Teams.  These Care Teams include your primary Cardiologist (physician) and Advanced Practice Providers (APPs -  Physician Assistants and Nurse Practitioners) who all work together to provide you with the care you need, when you need it.  Your physician wants you to follow-up in: 6 months with the Afib Clinic. They will contact you to schedule.   We recommend signing up for the patient portal called "MyChart".  Sign up information is provided on this After Visit Summary.  MyChart is used to connect with patients for Virtual Visits (Telemedicine).  Patients are able to view lab/test results, encounter notes, upcoming appointments, etc.  Non-urgent messages can be sent to your provider as well.   To learn more about what you can do with MyChart, go to [email or web address].    Any Other Special Instructions Will Be Listed Below (If Applicable).

## 2021-09-17 NOTE — Progress Notes (Signed)
PCP: Carol Ada, MD   Primary EP: Dr Wallis Bamberg MAYOLA MCBAIN is a 66 y.o. female who presents today for routine electrophysiology followup.  Since last being seen in our clinic, the patient reports doing very well.  Today, she denies symptoms of palpitations, chest pain, shortness of breath,  lower extremity edema, dizziness, presyncope, or syncope.  The patient is otherwise without complaint today.   Past Medical History:  Diagnosis Date   Breast cancer ([place]) 2008   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   Breast cancer ([place]) 2019   left ER/PR +,  XRT and chemotherapy   Family history of adverse reaction to anesthesia    N&V   Family history of prostate cancer    Lymphedema 2013   OSA (obstructive sleep apnea)    uses a dental appliance   Osteoarthritis    Persistent atrial fibrillation ([place])    History of Afib   PONV (postoperative nausea and vomiting)    Past Surgical History:  Procedure Laterality Date   ABDOMINAL HYSTERECTOMY     ATRIAL FIBRILLATION ABLATION N/A 03/03/2021   Procedure: ATRIAL FIBRILLATION ABLATION;  Surgeon: Thompson Grayer, MD;  Location: [place] [facility];  Service: Cardiovascular;  Laterality: N/A;   BREAST LUMPECTOMY Left 2008   BREAST LUMPECTOMY WITH RADIOACTIVE SEED LOCALIZATION Left 12/05/2017   Procedure: BREAST LUMPECTOMY WITH RADIOACTIVE SEED LOCALIZATION;  Surgeon: Rolm Bookbinder, MD;  Location: [place];  Service: General;  Laterality: Left;   BREAST RECONSTRUCTION Left 2019   no BP on Lt arm   CARDIOVERSION N/A 12/01/2020   Procedure: CARDIOVERSION;  Surgeon: Larey Dresser, MD;  Location: [facility] ENDOSCOPY;  Service: Cardiovascular;  Laterality: N/A;   CESAREAN SECTION      x 3   KNEE ARTHROSCOPY Right 2007   TEE WITHOUT CARDIOVERSION N/A 12/01/2020   Procedure: TRANSESOPHAGEAL ECHOCARDIOGRAM (TEE);  Surgeon: Larey Dresser, MD;  Location: [facility] ENDOSCOPY;  Service: Cardiovascular;  Laterality: N/A;   TOTAL HIP ARTHROPLASTY Left  06/15/2019   Procedure: LEFT TOTAL HIP ARTHROPLASTY ANTERIOR APPROACH;  Surgeon: Mcarthur Rossetti, MD;  Location: [facility];  Service: Orthopedics;  Laterality: Left;    ROS- all systems are reviewed and negatives except as per HPI above  Current Outpatient Medications  Medication Sig Dispense Refill   anastrozole (ARIMIDEX) 1 MG tablet Take 1 tablet (1 mg total) by mouth daily. 90 tablet 4   apixaban (ELIQUIS) 5 MG TABS tablet Take 1 tablet (5 mg total) by mouth 2 (two) times daily. 28 tablet 0   APPLE CIDER VINEGAR PO Take 2-3 tablets by mouth daily.     b complex vitamins capsule Take 1 capsule by mouth daily.     cholecalciferol (VITAMIN D3) 25 MCG (1000 UNIT) tablet Take 1,000 Units by mouth daily.     diltiazem (CARDIZEM) 30 MG tablet Take 1 tablet every 4 hours AS NEEDED for heart rate >100 as long as blood pressure >100. (Patient taking differently: Take 30 mg by mouth every 4 (four) hours as needed (heart rate >100 as long as blood pressure >100.).) 45 tablet 1   furosemide (LASIX) 40 MG tablet Take 1 tablet (40 mg total) by mouth daily. 90 tablet 3   Glucosamine HCl (GLUCOSAMINE PO) Take 2 tablets by mouth every morning.     metoprolol tartrate (LOPRESSOR) 50 MG tablet Take 1 tablet (50 mg total) by mouth 2 (two) times daily. 180 tablet 3   Multiple Vitamin tablet Take 2 tablets by  mouth daily.     No current facility-administered medications for this visit.    Physical Exam: Vitals:   09/17/21 1000  BP: 106/62  Pulse: 75  SpO2: 99%  Weight: 214 lb (97.1 kg)  Height: 5' 5.5" (1.664 m)    GEN- The patient is well appearing, alert and oriented x 3 today.   Head- normocephalic, atraumatic Eyes-  Sclera clear, conjunctiva pink Ears- hearing intact Oropharynx- clear Lungs- Clear to ausculation bilaterally, normal work of breathing Heart- Regular rate and rhythm, no murmurs, rubs or gallops, PMI not laterally displaced GI- soft, NT, ND, + BS Extremities- no clubbing,  cyanosis, or edema  Wt Readings from Last 3 Encounters:  09/17/21 214 lb (97.1 kg)  06/17/21 213 lb (96.6 kg)  06/01/21 211 lb (95.7 kg)   Echo 09/15/21- EF 60%, moderate LA enlargement  EKG tracing ordered today is personally reviewed and shows sinus  Assessment and Plan:  Persistent afib Afib is well controlled Chads2vasc score is 2.  She wishes to stop [place] therapy  We will therefore stop eliquis We will consider React AF trial once available  2. OSA Stable No change required today  3. Obesity Body mass index is 35.07 kg/m. Lifesytle modification advised  4. Tachycardia mediated CM Echo 1/23 reviewed EF has normalized with sinus rhythm  Risks, benefits and potential toxicities for medications prescribed and/or refilled reviewed with patient today.   AF clinic in 6 months  Thompson Grayer MD, [facility] 09/17/2021 10:13 AM

## 2021-10-05 ENCOUNTER — Other Ambulatory Visit: Payer: Self-pay

## 2021-10-05 ENCOUNTER — Encounter: Payer: Self-pay | Admitting: Physician Assistant

## 2021-10-05 ENCOUNTER — Ambulatory Visit (INDEPENDENT_AMBULATORY_CARE_PROVIDER_SITE_OTHER): Payer: Medicare Other | Admitting: Physician Assistant

## 2021-10-05 DIAGNOSIS — M1711 Unilateral primary osteoarthritis, right knee: Secondary | ICD-10-CM | POA: Diagnosis not present

## 2021-10-05 NOTE — Progress Notes (Signed)
HPI: [name] returns 4 weeks status post supplemental injection right knee.  She states the Synvisc 1 injection really did not help with the knee pain.  She is having pain mostly medial aspect of the knee.  She has known tricompartmental arthritis in the knee.  Unfortunately she has been dealing with knee pain on the left which is being exacerbated by the fact that she has a partial tear of her Achilles and is undergoing treatment for Achilles tendinitis.  She is currently in the cam walker boot on the right.  Review of systems: See HPI otherwise negative or noncontributory.  Right knee: No abnormal warmth erythema.  Global tenderness.  Full extension full flexion.  Slight varus malalignment.   Impression: Tricompartmental arthritis right knee  Plan: Given the fact the patient's failed conservative treatment she is asking about total knee replacement.  Risks benefits of surgery and postoperative protocol discussed with patient at length.  Risk include but are not limited to DVT/PE, wound healing problems, infection, prolonged pain, worsening pain, nerve vessel injury and blood loss.  She states she would like to finish treatment for her right Achilles prior to scheduling surgery for the right knee.  I did discuss with her that she would need to have clearance from her cardiologist.  She is no longer on anticoagulants after having an ablation for atrial fibrillation.  Questions were encouraged and answered at length.  She will let us know when she would like to proceed with knee replacement.  If its been more than 3 months since we seen her recommend that she have an office appointment prior to scheduling surgery

## 2021-10-08 ENCOUNTER — Ambulatory Visit (INDEPENDENT_AMBULATORY_CARE_PROVIDER_SITE_OTHER): Payer: Medicare Other | Admitting: Podiatry

## 2021-10-08 ENCOUNTER — Other Ambulatory Visit: Payer: Self-pay

## 2021-10-08 DIAGNOSIS — M7661 Achilles tendinitis, right leg: Secondary | ICD-10-CM

## 2021-10-08 DIAGNOSIS — M7731 Calcaneal spur, right foot: Secondary | ICD-10-CM

## 2021-10-11 NOTE — Progress Notes (Signed)
Subjective: 66 year old female presents to the office today for follow evaluation of Achilles tendinitis, pain.  We did immobilize in the cam boot with a heel lift for 2 weeks which she states was helpful.  She is transition to a shoe with a heel which is been helpful as well.  No new concerns or any recent injuries otherwise. Denies any systemic complaints such as fevers, chills, nausea, vomiting. No acute changes since last appointment, and no other complaints at this time.   A1c was 7.8.  Objective: AAO x3, NAD DP/PT pulses palpable bilaterally, CRT less than 3 seconds There is still mild discomfort present along the distal portion Achilles tendon along the insertion of the calcaneus but clinically the tendon appears to be intact and Thompson test is negative.  No pain with lateral compression of calcaneus no erythema discomfort of the foot and ankle.  MMT 5/5. No pain with calf compression, swelling, warmth, erythema  Assessment: Significant callus tendinitis with partial tear  Plan: -All treatment options discussed with the patient including all alternatives, risks, complications.  -At this point she has been immobilized and symptoms have improved.  We will plan on getting back to physical therapy.  Discussed with her starting therapy gradually before pushing it too much just to allow the tendon to heal by think that physical therapy be very beneficial for her.  I will refer her back to [facility]o care for PT.  -Patient encouraged to call the office with any questions, concerns, change in symptoms.   Trula Slade DPM

## 2021-10-12 ENCOUNTER — Other Ambulatory Visit: Payer: Self-pay | Admitting: Interventional Cardiology

## 2021-10-12 NOTE — Telephone Encounter (Signed)
Pt's eliquis was d/c'd at last ov.  "Persistent afib Afib is well controlled Chads2vasc score is 2.  She wishes to stop [place] therapy  We will therefore stop eliquis"

## 2021-10-21 ENCOUNTER — Encounter: Payer: Self-pay | Admitting: Physical Therapy

## 2021-10-21 ENCOUNTER — Other Ambulatory Visit: Payer: Self-pay

## 2021-10-21 ENCOUNTER — Ambulatory Visit (INDEPENDENT_AMBULATORY_CARE_PROVIDER_SITE_OTHER): Payer: Medicare Other | Admitting: Physical Therapy

## 2021-10-21 DIAGNOSIS — R262 Difficulty in walking, not elsewhere classified: Secondary | ICD-10-CM | POA: Diagnosis not present

## 2021-10-21 DIAGNOSIS — M25571 Pain in right ankle and joints of right foot: Secondary | ICD-10-CM

## 2021-10-21 DIAGNOSIS — M6281 Muscle weakness (generalized): Secondary | ICD-10-CM | POA: Diagnosis not present

## 2021-10-21 NOTE — Therapy (Signed)
OUTPATIENT PHYSICAL THERAPY LOWER EXTREMITY EVALUATION   Patient Name: Linda Foster MRN: [id number] DOB:1955/10/27, 66 y.o., female Today's Date: 10/21/2021   PT End of Session - 10/21/21 1505     Visit Number 1    Number of Visits 12    Date for PT Re-Evaluation 12/02/21    PT Start Time 98    PT Stop Time 1500    PT Time Calculation (min) 30 min    Activity Tolerance Patient tolerated treatment well    Behavior During Therapy [facility] for tasks assessed/performed             Past Medical History:  Diagnosis Date   Breast cancer ([place]) 2008   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   Breast cancer ([place]) 2019   left ER/PR +,  XRT and chemotherapy   Family history of adverse reaction to anesthesia    N&V   Family history of prostate cancer    Lymphedema 2013   OSA (obstructive sleep apnea)    uses a dental appliance   Osteoarthritis    Persistent atrial fibrillation ([place])    History of Afib   PONV (postoperative nausea and vomiting)    Past Surgical History:  Procedure Laterality Date   ABDOMINAL HYSTERECTOMY     ATRIAL FIBRILLATION ABLATION N/A 03/03/2021   Procedure: ATRIAL FIBRILLATION ABLATION;  Surgeon: Thompson Grayer, MD;  Location: [place] [facility];  Service: Cardiovascular;  Laterality: N/A;   BREAST LUMPECTOMY Left 2008   BREAST LUMPECTOMY WITH RADIOACTIVE SEED LOCALIZATION Left 12/05/2017   Procedure: BREAST LUMPECTOMY WITH RADIOACTIVE SEED LOCALIZATION;  Surgeon: Rolm Bookbinder, MD;  Location: [place];  Service: General;  Laterality: Left;   BREAST RECONSTRUCTION Left 2019   no BP on Lt arm   CARDIOVERSION N/A 12/01/2020   Procedure: CARDIOVERSION;  Surgeon: Larey Dresser, MD;  Location: [facility] ENDOSCOPY;  Service: Cardiovascular;  Laterality: N/A;   CESAREAN SECTION      x 3   KNEE ARTHROSCOPY Right 2007   TEE WITHOUT CARDIOVERSION N/A 12/01/2020   Procedure: TRANSESOPHAGEAL ECHOCARDIOGRAM (TEE);  Surgeon: Larey Dresser, MD;  Location[facility]  ENDOSCOPY;  Service: Cardiovascular;  Laterality: N/A;   TOTAL HIP ARTHROPLASTY Left 06/15/2019   Procedure: LEFT TOTAL HIP ARTHROPLASTY ANTERIOR APPROACH;  Surgeon: Mcarthur Rossetti, MD;  [facility];  Service: Orthopedics;  Laterality: Left;   Patient Active Problem List   Diagnosis Date Noted   Paroxysmal atrial fibrillation ([place]) 11/25/2020   Unilateral primary osteoarthritis, left hip 06/15/2019   Status post total replacement of left hip 06/15/2019   OSA (obstructive sleep apnea) 02/13/2018   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 01/03/2018   Family history of prostate cancer 01/02/2018   Genetic testing 12/15/2017   Primary malignant neoplasm of lower outer quadrant of female breast, left ([place]) 12/14/2017   History of therapeutic radiation 11/01/2017   Ductal carcinoma in situ (DCIS) of left breast 10/24/2017   Chest pain with moderate risk of acute coronary syndrome 11/03/2016   Atrial fibrillation with rapid ventricular response ([place]) 11/02/2016   History of invasive breast cancer 11/06/2012    PCP: Carol Ada, MD  REFERRING PROVIDER: Trula Slade, DPM  REFERRING DIAG: [phone] (ICD-10-CM) - Tendonitis, Achilles, right   THERAPY DIAG:  Pain in right ankle and joints of right foot - Plan: PT plan of care cert/re-cert  Muscle weakness (generalized) - Plan: PT plan of care cert/re-cert  Difficulty in walking, not elsewhere classified -  Plan: PT plan of care cert/re-cert  ONSET DATE: chronic x 1 year  SUBJECTIVE:   SUBJECTIVE STATEMENT: Pt is a 66 y/o female who returns to OPPT for Rt heel pain.  She's been in a boot since 09/21/21 and pain has slowly improved.  She has night splint for home that she has been wearing at night.  She has tried to wean from boot but has noticed some soreness present (no shooting pain).    PERTINENT HISTORY: Afib, hx breast cancer, Lt hip replacement  PAIN:  Are you having pain? Yes NPRS scale:  3/10 Pain location: heel Pain orientation: Right  PAIN TYPE: aching and sore, occasional shooting pains Pain description:  chronic   Aggravating factors: walking, prolonged standing Relieving factors: boot, therapy was helping  PRECAUTIONS: None  WEIGHT BEARING RESTRICTIONS No  FALLS:  Has patient fallen in last 6 months? No, Number of falls: 0  LIVING ENVIRONMENT: Lives with: lives with their spouse Lives in: House/apartment Stairs: Yes; External: 2 steps; none; has basement (doesn't go down there frequently, does take stairs step to pattern)  OCCUPATION: retired Garment/textile technologist  PLOF: Independent and Leisure: spend time with grandchildren; no regular exercise; was walking/running prior to injury  PATIENT GOALS improve pain, wants to go to [place] in September   OBJECTIVE:   DIAGNOSTIC FINDINGS: MRI: Severe distal Achilles tendinosis with mild peritendinitis. Small focal low to intermediate grade partial tear at the deep surface attachment on the calcaneus. Mild retrocalcaneal bursitis and adjacent bony edema in the posterior calcaneus.  PATIENT SURVEYS:  10/21/21 FOTO 60 (predicted 77)  COGNITION:  Overall cognitive status: Within functional limits for tasks assessed   PALPATION: Rt achilles tendon tenderness, trigger points in gastroc/soleus  LE AROM/PROM:  A/PROM Right 10/21/2021 Left 10/21/2021  Hip flexion    Hip extension    Hip abduction    Hip adduction    Hip internal rotation    Hip external rotation    Knee flexion    Knee extension    Ankle dorsiflexion A 0 P 8   Ankle plantarflexion A 58   Ankle inversion A 25   Ankle eversion A 18    (Blank rows = not tested)  LE MMT:  MMT Right 10/21/2021 Left 10/21/2021  Hip flexion    Hip extension    Hip abduction    Hip adduction    Hip internal rotation    Hip external rotation    Knee flexion    Knee extension    Ankle dorsiflexion 5/5   Ankle plantarflexion 3+/5   Ankle inversion 5/5   Ankle eversion  5/5    (Blank rows = not tested)    GAIT: Independent with boot    TODAY'S TREATMENT: See HEP - reviewed with trial reps PRN   PATIENT EDUCATION:  Education details: HEP Person educated: Patient Education method: Consulting civil engineer, Demonstration, and Handouts Education comprehension: verbalized understanding, returned demonstration, and needs further education   HOME EXERCISE PROGRAM: Access Code: [email or web address] URL: [email or web address] Date: 10/21/2021 Prepared by: Faustino Congress  Exercises Gastroc Stretch on Wall - 2 x daily - 7 x weekly - 1 sets - 3 reps - 30 sec hold Soleus Stretch on Wall - 2 x daily - 7 x weekly - 1 sets - 3 reps - 30 sec hold Heel Raise on Step - 2 x daily - 7 x weekly - 1-2 sets - 10 reps - 1-2 sec hold Calf Mobilization with Small Ball - 2 x daily - 7  x weekly - 1 sets - 1 reps - 3-5 min hold Seated Arch Lifts - 2 x daily - 7 x weekly - 1 sets - 10 reps - 5 sec hold hold  Patient Education Trigger Point Dry Needling  ASSESSMENT:  CLINICAL IMPRESSION: Patient is a 66 y.o. female who was seen today for physical therapy evaluation and treatment for Rt heel pain consistent with achilles tendinosis. Objective impairments include decreased activity tolerance, decreased balance, decreased mobility, difficulty walking, decreased ROM, decreased strength, increased fascial restrictions, increased muscle spasms, and pain. These impairments are limiting patient from cleaning, community activity, meal prep, and shopping. Personal factors including Past/current experiences and 3+ comorbidities: hx breast cancer, afib, Lt THA, OSA  are also affecting patient's functional outcome. Patient will benefit from skilled PT to address above impairments and improve overall function.  REHAB POTENTIAL: Good  CLINICAL DECISION MAKING: Stable/uncomplicated  EVALUATION COMPLEXITY: Low   GOALS: Goals reviewed with patient? Yes  SHORT TERM GOALS:  STG Name  Target Date Goal status  1 Independent with initial HEP Baseline:  11/11/2021 INITIAL   LONG TERM GOALS:   LTG Name Target Date Goal status  1 Independent with final HEP Baseline: 12/02/2021 INITIAL  2 FOTO score improved to 67 for improved function Baseline: 12/02/2021 INITIAL  3 Rt ankle active DF improved to 5 deg for improved function Baseline: 12/02/2021 INITIAL  4 Report pain < 2/10 with walking outside boot for improved function  Baseline: 12/02/2021 INITIAL  5 Demonstrate 5/5 Rt DF strength for improved mobility Baseline: 12/02/2021 INITIAL    PLAN: PT FREQUENCY: 2x/week  PT DURATION: 6 weeks  PLANNED INTERVENTIONS: Therapeutic exercises, Therapeutic activity, Neuro Muscular re-education, Balance training, Gait training, Patient/Family education, Joint mobilization, Stair training, Dry Needling, Electrical stimulation, Cryotherapy, Moist heat, Taping, Vasopneumatic device, Ultrasound, Ionotophoresis 4mg /ml Dexamethasone, and Manual therapy  PLAN FOR NEXT SESSION: review HEP, consider DN/ionto, gentle strengthening     Laureen Abrahams, PT, DPT 10/21/21 3:08 PM

## 2021-10-23 ENCOUNTER — Ambulatory Visit (INDEPENDENT_AMBULATORY_CARE_PROVIDER_SITE_OTHER): Payer: Medicare Other | Admitting: Physical Therapy

## 2021-10-23 ENCOUNTER — Other Ambulatory Visit: Payer: Self-pay

## 2021-10-23 ENCOUNTER — Encounter: Payer: Self-pay | Admitting: Physical Therapy

## 2021-10-23 DIAGNOSIS — M25571 Pain in right ankle and joints of right foot: Secondary | ICD-10-CM

## 2021-10-23 DIAGNOSIS — M6281 Muscle weakness (generalized): Secondary | ICD-10-CM

## 2021-10-23 DIAGNOSIS — R262 Difficulty in walking, not elsewhere classified: Secondary | ICD-10-CM

## 2021-10-23 NOTE — Therapy (Signed)
OUTPATIENT PHYSICAL THERAPY TREATMENT NOTE   Patient Name: Linda Foster MRN: [id number] DOB:1955-09-16, 66 y.o., female Today's Date: 10/23/2021  PCP: Carol Ada, MD REFERRING PROVIDER: Trula Slade, DPM   PT End of Session - 10/23/21 [phone]     Visit Number 2    Number of Visits 12    Date for PT Re-Evaluation 12/02/21    PT Start Time 0846    PT Stop Time 0931    PT Time Calculation (min) 45 min    Activity Tolerance Patient tolerated treatment well    Behavior During Therapy [facility] for tasks assessed/performed             Past Medical History:  Diagnosis Date   Breast cancer ([place]) 2008   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   Breast cancer ([place]) 2019   left ER/PR +,  XRT and chemotherapy   Family history of adverse reaction to anesthesia    N&V   Family history of prostate cancer    Lymphedema 2013   OSA (obstructive sleep apnea)    uses a dental appliance   Osteoarthritis    Persistent atrial fibrillation ([place])    History of Afib   PONV (postoperative nausea and vomiting)    Past Surgical History:  Procedure Laterality Date   ABDOMINAL HYSTERECTOMY     ATRIAL FIBRILLATION ABLATION N/A 03/03/2021   Procedure: ATRIAL FIBRILLATION ABLATION;  Surgeon: Thompson Grayer, MD;  Location: [place] [facility];  Service: Cardiovascular;  Laterality: N/A;   BREAST LUMPECTOMY Left 2008   BREAST LUMPECTOMY WITH RADIOACTIVE SEED LOCALIZATION Left 12/05/2017   Procedure: BREAST LUMPECTOMY WITH RADIOACTIVE SEED LOCALIZATION;  Surgeon: Rolm Bookbinder, MD;  Location: [place];  Service: General;  Laterality: Left;   BREAST RECONSTRUCTION Left 2019   no BP on Lt arm   CARDIOVERSION N/A 12/01/2020   Procedure: CARDIOVERSION;  Surgeon: Larey Dresser, MD;  Location: [facility] ENDOSCOPY;  Service: Cardiovascular;  Laterality: N/A;   CESAREAN SECTION      x 3   KNEE ARTHROSCOPY Right 2007   TEE WITHOUT CARDIOVERSION N/A 12/01/2020   Procedure: TRANSESOPHAGEAL  ECHOCARDIOGRAM (TEE);  Surgeon: Larey Dresser, MD;  Location: [facility] END[facility]COPY;  Service: Cardiovascular;  Laterality: N/A;   TOTAL HIP ARTHROPLASTY Left 06/15/2019   Procedure: LEFT TOTAL HIP ARTHROPLASTY ANTERIOR APPROACH;  Surgeon: Mcarthur Rossetti, MD;  Location: [facility];  Service: Orthopedics;  Laterality: Left;   Patient Active Problem List   Diagnosis Date Noted   Paroxysmal atrial fibrillation ([place]) 11/25/2020   Unilateral primary osteoarthritis, left hip 06/15/2019   Status post total replacement of left hip 06/15/2019   OSA (obstructive sleep apnea) 02/13/2018   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 01/03/2018   Family history of prostate cancer 01/02/2018   Genetic testing 12/15/2017   Primary malignant neoplasm of lower outer quadrant of female breast, left ([place]) 12/14/2017   History of therapeutic radiation 11/01/2017   Ductal carcinoma in situ (DCIS) of left breast 10/24/2017   Chest pain with moderate risk of acute coronary syndrome 11/03/2016   Atrial fibrillation with rapid ventricular response ([place]) 11/02/2016   History of invasive breast cancer 11/06/2012    PCP: Carol Ada, MD   REFERRING PROVIDER: Trula Slade, DPM   REFERRING DIAG: [id number] (ICD-10-CM) - Tendonitis, Achilles, right   THERAPY DIAG:  Pain in right ankle and joints of right foot  Muscle weakness (generalized)  Difficulty in walking, not elsewhere classified   SUBJECTIVE:  SUBJECTIVE STATEMENT: relays she can tell some improvement overall in her Rt ankle, pain is not bad today, she has been doing HEP. She arrives for first time in regular shoes and is weaning out of CAM boot.   PERTINENT HISTORY: Afib, hx breast cancer, Lt hip replacement   PAIN:  Are you having pain? Yes NPRS scale: 1-2/10 Pain location: heel Pain orientation: Right  PAIN TYPE: aching and sore, occasional shooting pains Pain description:  chronic   Aggravating factors: walking,  prolonged standing Relieving factors: boot, therapy was helping   PRECAUTIONS: None   WEIGHT BEARING RESTRICTIONS No    PATIENT GOALS improve pain, wants to go to [facility] in September     OBJECTIVE:    DIAGNOSTIC FINDINGS: MRI: Severe distal Achilles tendinosis with mild peritendinitis. Small focal low to intermediate grade partial tear at the deep surface attachment on the calcaneus. Mild retrocalcaneal bursitis and adjacent bony edema in the posterior calcaneus.   PATIENT SURVEYS:  10/21/21 FOTO 60 (predicted 64)   LE AROM/PROM:   A/PROM Right 10/21/2021 Left 10/21/2021  Hip flexion      Hip extension      Hip abduction      Hip adduction      Hip internal rotation      Hip external rotation      Knee flexion      Knee extension      Ankle dorsiflexion A 0 P 8    Ankle plantarflexion A 58    Ankle inversion A 25    Ankle eversion A 18     (Blank rows = not tested)   LE MMT:   MMT Right 10/21/2021 Left 10/21/2021  Hip flexion      Hip extension      Hip abduction      Hip adduction      Hip internal rotation      Hip external rotation      Knee flexion      Knee extension      Ankle dorsiflexion 5/5    Ankle plantarflexion 3+/5    Ankle inversion 5/5    Ankle eversion 5/5     (Blank rows = not tested)        10/23/21  Therapeutic Exercise:  Aerobic: Supine:Gastroc stretch 30 sec X3 with strap, soleus stretch 30 sec X 3 with strap, green band for ankle 4 way X 15 ea holding 5 seconds Prone:  Seated:  Standing:Heel raises with light UE support X 15 with slow eccentrics, standing lunge stretch with Rt foot on slantboard 5 sec X10 Neuromuscular Re-education: Manual Therapy:active compression and skilled palpation with DN Therapeutic Activity: Self Care: Trigger Point Dry Needling: consent given and education verbally provided for DN to her Rt gastroc, soleus and achilies tendon combined with Estim using micro current at 100 frequency to intensity level 3.5.  Good overall tolerance to this with twitch response elicited Modalities:      10/21/21   PATIENT EDUCATION:  Education details: HEP Person educated: Patient Education method: Consulting civil engineer, Media planner, and Handouts Education comprehension: verbalized understanding, returned demonstration, and needs further education     HOME EXERCISE PROGRAM: Access Code: [email or web address] URL: [email or web address] Date: 10/21/2021 Prepared by: Faustino Congress   Exercises Gastroc Stretch on Wall - 2 x daily - 7 x weekly - 1 sets - 3 reps - 30 sec hold Soleus Stretch on Wall - 2 x daily - 7 x weekly - 1 sets - 3 reps - 30 sec  hold Heel Raise on Step - 2 x daily - 7 x weekly - 1-2 sets - 10 reps - 1-2 sec hold Calf Mobilization with Small Ball - 2 x daily - 7 x weekly - 1 sets - 1 reps - 3-5 min hold Seated Arch Lifts - 2 x daily - 7 x weekly - 1 sets - 10 reps - 5 sec hold hold   Patient Education Trigger Point Dry Needling   ASSESSMENT:   CLINICAL IMPRESSION: She shows good understanding and compliance with HEP and is showing some early progress and transitioning to regular shoes. We performed DN today to see if this assists with Rt ankle pain and tightness today.    REHAB POTENTIAL: Good   CLINICAL DECISION MAKING: Stable/uncomplicated   EVALUATION COMPLEXITY: Low     GOALS: Goals reviewed with patient? Yes   SHORT TERM GOALS:   STG Name Target Date Goal status  1 Independent with initial HEP Baseline:  11/11/2021 INITIAL    LONG TERM GOALS:    LTG Name Target Date Goal status  1 Independent with final HEP Baseline: 12/02/2021 INITIAL  2 FOTO score improved to 67 for improved function Baseline: 12/02/2021 INITIAL  3 Rt ankle active DF improved to 5 deg for improved function Baseline: 12/02/2021 INITIAL  4 Report pain < 2/10 with walking outside boot for improved function  Baseline: 12/02/2021 INITIAL  5 Demonstrate 5/5 Rt DF strength for improved mobility Baseline:  12/02/2021 INITIAL      PLAN: PT FREQUENCY: 2x/week   PT DURATION: 6 weeks   PLANNED INTERVENTIONS: Therapeutic exercises, Therapeutic activity, Neuro Muscular re-education, Balance training, Gait training, Patient/Family education, Joint mobilization, Stair training, Dry Needling, Electrical stimulation, Cryotherapy, Moist heat, Taping, Vasopneumatic device, Ultrasound, Ionotophoresis 4mg /ml Dexamethasone, and Manual therapy   PLAN FOR NEXT SESSION: how was DN?    Debbe Odea, PT,DPT 10/23/2021, 9:33 AM

## 2021-10-28 ENCOUNTER — Other Ambulatory Visit: Payer: Self-pay

## 2021-10-28 ENCOUNTER — Ambulatory Visit (INDEPENDENT_AMBULATORY_CARE_PROVIDER_SITE_OTHER): Payer: Medicare Other | Admitting: Physical Therapy

## 2021-10-28 ENCOUNTER — Encounter: Payer: Self-pay | Admitting: Physical Therapy

## 2021-10-28 DIAGNOSIS — M25571 Pain in right ankle and joints of right foot: Secondary | ICD-10-CM

## 2021-10-28 DIAGNOSIS — M6281 Muscle weakness (generalized): Secondary | ICD-10-CM

## 2021-10-28 DIAGNOSIS — R262 Difficulty in walking, not elsewhere classified: Secondary | ICD-10-CM | POA: Diagnosis not present

## 2021-10-28 NOTE — Therapy (Signed)
OUTPATIENT PHYSICAL THERAPY TREATMENT NOTE   Patient Name: Linda Foster MRN: [id number] DOB:10/31/1955, 66 y.o., female Today's Date: 10/28/2021  PCP: Carol Ada, MD REFERRING PROVIDER: Trula Slade, DPM   PT End of Session - 10/28/21 1418     Visit Number 3    Number of Visits 12    Date for PT Re-Evaluation 12/02/21    PT Start Time 9211    PT Stop Time 1500    PT Time Calculation (min) 40 min    Activity Tolerance Patient tolerated treatment well    Behavior During Therapy [facility] for tasks assessed/performed              Past Medical History:  Diagnosis Date   Breast cancer ([place]) 2008   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   Breast cancer ([place]) 2019   left ER/PR +,  XRT and chemotherapy   Family history of adverse reaction to anesthesia    N&V   Family history of prostate cancer    Lymphedema 2013   OSA (obstructive sleep apnea)    uses a dental appliance   Osteoarthritis    Persistent atrial fibrillation ([place])    History of Afib   PONV (postoperative nausea and vomiting)    Past Surgical History:  Procedure Laterality Date   ABDOMINAL HYSTERECTOMY     ATRIAL FIBRILLATION ABLATION N/A 03/03/2021   Procedure: ATRIAL FIBRILLATION ABLATION;  Surgeon: Thompson Grayer, MD;  Location: [place] [facility];  Service: Cardiovascular;  Laterality: N/A;   BREAST LUMPECTOMY Left 2008   BREAST LUMPECTOMY WITH RADIOACTIVE SEED LOCALIZATION Left 12/05/2017   Procedure: BREAST LUMPECTOMY WITH RADIOACTIVE SEED LOCALIZATION;  Surgeon: Rolm Bookbinder, MD;  Location: [place];  Service: General;  Laterality: Left;   BREAST RECONSTRUCTION Left 2019   no BP on Lt arm   CARDIOVERSION N/A 12/01/2020   Procedure: CARDIOVERSION;  Surgeon: Larey Dresser, MD;  Location: [facility] ENDOSCOPY;  Service: Cardiovascular;  Laterality: N/A;   CESAREAN SECTION      x 3   KNEE ARTHROSCOPY Right 2007   TEE WITHOUT CARDIOVERSION N/A 12/01/2020   Procedure: TRANSESOPHAGEAL  ECHOCARDIOGRAM (TEE);  Surgeon: Larey Dresser, MD;  Location: 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 01/03/2018   Family history of prostate cancer 01/02/2018   Genetic testing 12/15/2017   Primary malignant neoplasm of lower outer quadrant of female breast, left ([place]) 12/14/2017   History of therapeutic radiation 11/01/2017   Ductal carcinoma in situ (DCIS) of left breast 10/24/2017   Chest pain with moderate risk of acute coronary syndrome 11/03/2016   Atrial fibrillation with rapid ventricular response ([place]) 11/02/2016   History of invasive breast cancer 11/06/2012    PCP: Carol Ada, MD   REFERRING PROVIDER: Trula Slade, DPM   REFERRING DIAG: [phone] (ICD-10-CM) - Tendonitis, Achilles, right   THERAPY DIAG:  Pain in right ankle and joints of right foot  Muscle weakness (generalized)  Difficulty in walking, not elsewhere classified  SUBJECTIVE:    SUBJECTIVE STATEMENT: Doing okay today, forgot other shoes today - has clog style shoe on today; felt DN provided short term relief, but didn't last much longer than a day or so    PERTINENT HISTORY: Afib, hx breast cancer, Lt hip replacement   PAIN:  Are you having pain? Yes NPRS scale: 2/10 Pain location: heel Pain orientation: Right  PAIN TYPE: aching and sore, "like it's bruised" Pain description:  chronic   Aggravating factors: walking, prolonged standing Relieving  factors: boot, therapy was helping   PRECAUTIONS: None   WEIGHT BEARING RESTRICTIONS No    PATIENT GOALS improve pain, wants to go to [facility] in September     OBJECTIVE:    DIAGNOSTIC FINDINGS: MRI: Severe distal Achilles tendinosis with mild peritendinitis. Small focal low to intermediate grade partial tear at the deep surface attachment on the calcaneus. Mild retrocalcaneal bursitis and adjacent bony edema in the posterior calcaneus.   PATIENT SURVEYS:  10/21/21 FOTO 60 (predicted 67)   LE AROM/PROM:   A/PROM Right 10/21/2021 Left 10/21/2021  Hip flexion      Hip extension      Hip abduction      Hip adduction      Hip internal rotation      Hip external rotation      Knee flexion      Knee extension      Ankle dorsiflexion A 0 P 8    Ankle plantarflexion A 58    Ankle inversion A 25    Ankle eversion A 18     (Blank rows = not tested)   LE MMT:   MMT Right 10/21/2021 Left 10/21/2021  Hip flexion      Hip extension      Hip abduction      Hip adduction      Hip internal rotation      Hip external rotation      Knee flexion      Knee extension      Ankle dorsiflexion 5/5    Ankle plantarflexion 3+/5    Ankle inversion 5/5    Ankle eversion 5/5     (Blank rows = not tested)      TODAY'S TREATMENT 10/28/21 Therapeutic Exercise:  Supine: green band for ankle 4 way X 15 ea holding 5 seconds  Seated:    Ankle 4-way x 20 each, L3 band on Rt   Heel lift 10 x 5 sec hold  Standing: Heel raises with light UE support X 20 with slow eccentrics on slant board,  Standing lunge stretch with Rt foot on slantboard  sec X10 Gastroc stretch 3x30 sec on slantboard Soleus stretch on Rt 3x30 sec SLS on Rt 3x15 sec on level ground, on foam 3x15 sec with intermittent UE support Side shuffle at counter x 2 laps Sustained PF hold 3x30 sec  10/23/21  Therapeutic Exercise:  Aerobic: Supine:Gastroc stretch 30 sec X3 with strap, soleus stretch 30 sec X 3 with strap, green band  for ankle 4 way X 15 ea holding 5 seconds Prone:  Seated:  Standing:Heel raises with light UE support X 15 with slow eccentrics, standing lunge stretch with Rt foot on slantboard 5 sec X10 Neuromuscular Re-education: Manual Therapy:active compression and skilled palpation with DN Therapeutic Activity: Self Care: Trigger Point Dry Needling: consent given and education verbally provided for DN to her Rt gastroc, soleus and achilies tendon combined with Estim using micro current at 100 frequency to intensity level  3.5. Good overall tolerance to this with twitch response elicited Modalities:       PATIENT EDUCATION:  Education details: HEP Person educated: Patient Education method: Consulting civil engineer, Demonstration, and Handouts Education comprehension: verbalized understanding, returned demonstration, and needs further education     HOME EXERCISE PROGRAM: Access Code: [email or web address] URL: [email or web address] Date: 10/21/2021 Prepared by: Faustino Congress   Exercises Gastroc Stretch on Wall - 2 x daily - 7 x weekly - 1 sets - 3 reps - 30 sec hold Soleus Stretch on Wall - 2 x daily - 7 x weekly - 1 sets - 3 reps - 30 sec hold Heel Raise on Step - 2 x daily - 7 x weekly - 1-2 sets - 10 reps - 1-2 sec hold Calf Mobilization with Small Ball - 2 x daily - 7 x weekly - 1 sets - 1 reps - 3-5 min hold Seated Arch Lifts - 2 x daily - 7 x weekly - 1 sets - 10 reps - 5 sec hold hold   Patient Education Trigger Point Dry Needling   ASSESSMENT:   CLINICAL IMPRESSION: Deferred DN and manual today as limited improvement noted from prior session.  Can always revisit if needed.  Tolerated session well today with expected fatigue.  Recommended she resume wearing night splint to help maintain DF stretch.     REHAB POTENTIAL: Good   CLINICAL DECISION MAKING: Stable/uncomplicated   EVALUATION COMPLEXITY: Low     GOALS: Goals reviewed with patient? Yes   SHORT TERM GOALS:   STG Name  Target Date Goal status  1 Independent with initial HEP Baseline:  11/11/2021 INITIAL    LONG TERM GOALS:    LTG Name Target Date Goal status  1 Independent with final HEP Baseline: 12/02/2021 INITIAL  2 FOTO score improved to 67 for improved function Baseline: 12/02/2021 INITIAL  3 Rt ankle active DF improved to 5 deg for improved function Baseline: 12/02/2021 INITIAL  4 Report pain < 2/10 with walking outside boot for improved function  Baseline: 12/02/2021 INITIAL  5 Demonstrate 5/5 Rt DF strength for improved mobility Baseline: 12/02/2021 INITIAL      PLAN: PT FREQUENCY: 2x/week   PT DURATION: 6 weeks   PLANNED INTERVENTIONS: Therapeutic exercises, Therapeutic activity, Neuro Muscular re-education, Balance training, Gait training, Patient/Family education, Joint mobilization, Stair training, Dry Needling, Electrical stimulation, Cryotherapy, Moist heat, Taping, Vasopneumatic device, Ultrasound, Ionotophoresis 4mg /ml Dexamethasone, and Manual therapy   PLAN FOR NEXT SESSION: get updated ROM measurements, continue with strengthening/ROM, DN/manual PRN    Faustino Congress, PT,DPT 10/28/2021, 3:02 PM

## 2021-11-02 ENCOUNTER — Other Ambulatory Visit: Payer: Self-pay

## 2021-11-02 ENCOUNTER — Encounter: Payer: Self-pay | Admitting: Physical Therapy

## 2021-11-02 ENCOUNTER — Ambulatory Visit (INDEPENDENT_AMBULATORY_CARE_PROVIDER_SITE_OTHER): Payer: Medicare Other | Admitting: Physical Therapy

## 2021-11-02 DIAGNOSIS — M25561 Pain in right knee: Secondary | ICD-10-CM | POA: Diagnosis not present

## 2021-11-02 DIAGNOSIS — R262 Difficulty in walking, not elsewhere classified: Secondary | ICD-10-CM

## 2021-11-02 DIAGNOSIS — G8929 Other chronic pain: Secondary | ICD-10-CM

## 2021-11-02 DIAGNOSIS — M6281 Muscle weakness (generalized): Secondary | ICD-10-CM

## 2021-11-02 DIAGNOSIS — M25562 Pain in left knee: Secondary | ICD-10-CM

## 2021-11-02 DIAGNOSIS — M25571 Pain in right ankle and joints of right foot: Secondary | ICD-10-CM

## 2021-11-02 NOTE — Therapy (Addendum)
OUTPATIENT PHYSICAL THERAPY TREATMENT NOTE   Patient Name: Linda Foster MRN: [id number] DOB:July 26, 1956, 66 y.o., female Today's Date: 11/02/2021  PCP: Carol Ada, MD REFERRING PROVIDER: Trula Slade, DPM   PT End of Session - 11/02/21 1306     Visit Number 4    Number of Visits 12    Date for PT Re-Evaluation 12/02/21    PT Start Time 70    PT Stop Time 0867    PT Time Calculation (min) 40 min    Activity Tolerance Patient tolerated treatment well    Behavior During Therapy [facility] for tasks assessed/performed              Past Medical History:  Diagnosis Date   Breast cancer ([place]) 2008   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   Breast cancer ([place]) 2019   left ER/PR +,  XRT and chemotherapy   Family history of adverse reaction to anesthesia    N&V   Family history of prostate cancer    Lymphedema 2013   OSA (obstructive sleep apnea)    uses a dental appliance   Osteoarthritis    Persistent atrial fibrillation ([place])    History of Afib   PONV (postoperative nausea and vomiting)    Past Surgical History:  Procedure Laterality Date   ABDOMINAL HYSTERECTOMY     ATRIAL FIBRILLATION ABLATION N/A 03/03/2021   Procedure: ATRIAL FIBRILLATION ABLATION;  Surgeon: Thompson Grayer, MD;  Location: [place] [facility];  Service: Cardiovascular;  Laterality: N/A;   BREAST LUMPECTOMY Left 2008   BREAST LUMPECTOMY WITH RADIOACTIVE SEED LOCALIZATION Left 12/05/2017   Procedure: BREAST LUMPECTOMY WITH RADIOACTIVE SEED LOCALIZATION;  Surgeon: Rolm Bookbinder, MD;  Location: [place];  Service: General;  Laterality: Left;   BREAST RECONSTRUCTION Left 2019   no BP on Lt arm   CARDIOVERSION N/A 12/01/2020   Procedure: CARDIOVERSION;  Surgeon: Larey Dresser, MD;  Location: [facility] ENDOSCOPY;  Service: Cardiovascular;  Laterality: N/A;   CESAREAN SECTION      x 3   KNEE ARTHROSCOPY Right 2007   TEE WITHOUT CARDIOVERSION N/A 12/01/2020   Procedure: TRANSESOPHAGEAL  ECHOCARDIOGRAM (TEE);  Surgeon: Larey Dresser, MD;  Location: [facility] ENDOSCOPY;  Service: Cardiovascular;  Laterality: N/A;   TOTAL HIP ARTHROPLASTY Left 06/15/2019   Procedure: LEFT TOTAL HIP ARTHROPLASTY ANTERIOR APPROACH;  Surgeon: Mcarthur Rossetti, MD;  Location: [facility];  Service: Orthopedics;  Laterality: Left;   Patient Active Problem List   Diagnosis Date Noted   Paroxysmal atrial fibrillation ([place]) 11/25/2020   Unilateral primary osteoarthritis, left hip 06/15/2019   Status post total replacement of left hip 06/15/2019   OSA (obstructive sleep apnea) 02/13/2018   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 01/03/2018   Family history of prostate cancer 01/02/2018   Genetic testing 12/15/2017   Primary malignant neoplasm of lower outer quadrant of female breast, left ([place]) 12/14/2017   History of therapeutic radiation 11/01/2017   Ductal carcinoma in situ (DCIS) of left breast 10/24/2017   Chest pain with moderate risk of acute coronary syndrome 11/03/2016   Atrial fibrillation with rapid ventricular response ([place]) 11/02/2016   History of invasive breast cancer 11/06/2012    PCP: Carol Ada, MD   REFERRING PROVIDER: Trula Slade, DPM   REFERRING DIAG: [phone] (ICD-10-CM) - Tendonitis, Achilles, right   THERAPY DIAG:  Pain in right ankle and joints of right foot  Muscle weakness (generalized)  Difficulty in walking, not elsewhere classified  Chronic  pain of right knee  Chronic pain of left knee   SUBJECTIVE:    SUBJECTIVE STATEMENT: Her knees bother her more than her achilles today, has been wearing boot but arrives to PT today in sneakers.    PERTINENT HISTORY: Afib, hx breast cancer, Lt hip replacement   PAIN:  Are you having pain? Yes NPRS scale: 1/10 Pain location: heel Pain orientation: Right  PAIN TYPE: aching and sore Pain description:  chronic   Aggravating factors: walking, prolonged standing Relieving factors:  boot, therapy was helping   PRECAUTIONS: None   WEIGHT BEARING RESTRICTIONS No    PATIENT GOALS improve pain, wants to go to [facility] in September     OBJECTIVE:    DIAGNOSTIC FINDINGS: MRI: Severe distal Achilles tendinosis with mild peritendinitis. Small focal low to intermediate grade partial tear at the deep surface attachment on the calcaneus. Mild retrocalcaneal bursitis and adjacent bony edema in the posterior calcaneus.   PATIENT SURVEYS:  10/21/21 FOTO 60 (predicted 93)   LE AROM/PROM:   A/PROM Right 10/21/2021 Right 11/02/2021  Hip flexion      Hip extension      Hip abduction      Hip adduction      Hip internal rotation      Hip external rotation      Knee flexion      Knee extension      Ankle dorsiflexion A 0 P 8 A 8   Ankle plantarflexion A 58 A 48   Ankle inversion A 25 A 30   Ankle eversion A 18 A 25    (Blank rows = not tested)   LE MMT:   MMT Right 10/21/2021   Hip flexion      Hip extension      Hip abduction      Hip adduction      Hip internal rotation      Hip external rotation      Knee flexion      Knee extension      Ankle dorsiflexion 5/5    Ankle plantarflexion 3+/5    Ankle inversion 5/5    Ankle eversion 5/5     (Blank rows = not tested)      TODAY'S TREATMENT 11/02/21 Therapeutic Exercise:  Supine: Rt ankle PROM into DF  Seated: Baps board for Rt ankle L4 X 10 for sup/pro, and circles CW,CCW. Then added 5# for BAPS board PF 2X15. Leg press DL 75#2X15. Sustained PF hold 3x30 sec with blue and green band together  Standing: Heel raises with light UE support X 20 with slow eccentrics on slant board,  Standing lunge stretch with Rt foot on slantboard  sec X10 Gastroc stretch 3x30 sec on slantboard Soleus stretch on Rt 3x30 sec Rt leg on 4 inch steps performing Lt leg heel taps to strengthen Rt knee and improve Rt ankle DF X10.  10/28/21 Therapeutic Exercise:             Supine: green band for ankle 4 way X 15 ea holding 5  seconds             Seated:                          Ankle 4-way x 20 each, L3 band on Rt                         Heel lift 10  x 5 sec hold             Standing: Heel raises with light UE support X 20 with slow eccentrics on slant board,  Standing lunge stretch with Rt foot on slantboard  sec X10 Gastroc stretch 3x30 sec on slantboard Soleus stretch on Rt 3x30 sec SLS on Rt 3x15 sec on level ground, on foam 3x15 sec with intermittent UE support Side shuffle at counter x 2 laps Sustained PF hold 3x30 sec   10/23/21  Therapeutic Exercise:  Aerobic: Supine:Gastroc stretch 30 sec X3 with strap, soleus stretch 30 sec X 3 with strap, green band for ankle 4 way X 15 ea holding 5 seconds Prone:  Seated:  Standing:Heel raises with light UE support X 15 with slow eccentrics, standing lunge stretch with Rt foot on slantboard 5 sec X10 Neuromuscular Re-education: Manual Therapy:active compression and skilled palpation with DN Therapeutic Activity: Self Care: Trigger Point Dry Needling: consent given and education verbally provided for DN to her Rt gastroc, soleus and achilies tendon combined with Estim using micro current at 100 frequency to intensity level 3.5. Good overall tolerance to this with twitch response elicited Modalities:       PATIENT EDUCATION:  Education details: HEP Person educated: Patient Education method: Consulting civil engineer, Demonstration, and Handouts Education comprehension: verbalized understanding, returned demonstration, and needs further education     HOME EXERCISE PROGRAM: Access Code: [email or web address] URL: [email or web address] Date: 10/21/2021 Prepared by: Faustino Congress   Exercises Gastroc Stretch on Wall - 2 x daily - 7 x weekly - 1 sets - 3 reps - 30 sec hold Soleus Stretch on Wall - 2 x daily - 7 x weekly - 1 sets - 3 reps - 30 sec hold Heel Raise on Step - 2 x daily - 7 x weekly - 1-2 sets - 10 reps - 1-2 sec hold Calf Mobilization with Small Ball  - 2 x daily - 7 x weekly - 1 sets - 1 reps - 3-5 min hold Seated Arch Lifts - 2 x daily - 7 x weekly - 1 sets - 10 reps - 5 sec hold hold   Patient Education Trigger Point Dry Needling   ASSESSMENT:   CLINICAL IMPRESSION: She is progressing overall with PT and has been weaning out of boot and wearing sneakers more. Her ankle ROM showed improvements today. She does still lack knee and ankle strength and will continue to benefit from skilled PT.    REHAB POTENTIAL: Good   CLINICAL DECISION MAKING: Stable/uncomplicated   EVALUATION COMPLEXITY: Low     GOALS: Goals reviewed with patient? Yes   SHORT TERM GOALS:   STG Name Target Date Goal status  1 Independent with initial HEP Baseline:  11/11/2021 MET    LONG TERM GOALS:    LTG Name Target Date Goal status  1 Independent with final HEP Baseline: 12/02/2021 ongoing  2 FOTO score improved to 67 for improved function Baseline: 12/02/2021 ongoing  3 Rt ankle active DF improved to 5 deg for improved function Baseline: 12/02/2021 ongoing  4 Report pain < 2/10 with walking outside boot for improved function  Baseline: 12/02/2021 ongoing  5 Demonstrate 5/5 Rt DF strength for improved mobility Baseline: 12/02/2021 ongoing      PLAN: PT FREQUENCY: 2x/week   PT DURATION: 6 weeks   PLANNED INTERVENTIONS: Therapeutic exercises, Therapeutic activity, Neuro Muscular re-education, Balance training, Gait training, Patient/Family education, Joint mobilization, Stair training, Dry Needling, Electrical stimulation, Cryotherapy, Moist heat, Taping, Vasopneumatic device, Ultrasound, Ionotophoresis  53m/ml Dexamethasone, and Manual therapy   PLAN FOR NEXT SESSION: continue with strengthening/ROM, DN/manual PRN    BDebbe Odea PT,DPT 11/02/2021, 1:07 PM

## 2021-11-05 ENCOUNTER — Other Ambulatory Visit: Payer: Self-pay

## 2021-11-05 ENCOUNTER — Ambulatory Visit (INDEPENDENT_AMBULATORY_CARE_PROVIDER_SITE_OTHER): Payer: Medicare Other | Admitting: Physical Therapy

## 2021-11-05 ENCOUNTER — Encounter: Payer: Self-pay | Admitting: Physical Therapy

## 2021-11-05 DIAGNOSIS — M25561 Pain in right knee: Secondary | ICD-10-CM

## 2021-11-05 DIAGNOSIS — M25571 Pain in right ankle and joints of right foot: Secondary | ICD-10-CM

## 2021-11-05 DIAGNOSIS — M6281 Muscle weakness (generalized): Secondary | ICD-10-CM | POA: Diagnosis not present

## 2021-11-05 DIAGNOSIS — M25562 Pain in left knee: Secondary | ICD-10-CM

## 2021-11-05 DIAGNOSIS — G8929 Other chronic pain: Secondary | ICD-10-CM

## 2021-11-05 DIAGNOSIS — R262 Difficulty in walking, not elsewhere classified: Secondary | ICD-10-CM

## 2021-11-05 NOTE — Therapy (Signed)
OUTPATIENT PHYSICAL THERAPY TREATMENT NOTE   Patient Name: Linda Foster MRN: [id number] DOB:[birth year], 66 y.o., female Today's Date: 11/05/2021  PCP: Carol Ada, MD REFERRING PROVIDER: Trula Slade, DPM   PT End of Session - 11/05/21 1351     Visit Number 5    Number of Visits 12    Date for PT Re-Evaluation 12/02/21    PT Start Time 8299    PT Stop Time 3716    PT Time Calculation (min) 32 min    Activity Tolerance Patient tolerated treatment well    Behavior During Therapy 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   Breast cancer ([place]) 2019   left ER/PR +,  XRT and chemotherapy   Family history of adverse reaction to anesthesia    N&V   Family history of prostate cancer    Lymphedema 2013   OSA (obstructive sleep apnea)    uses a dental appliance   Osteoarthritis    Persistent atrial fibrillation ([place])    History of Afib   PONV (postoperative nausea and vomiting)    Past Surgical History:  Procedure Laterality Date   ABDOMINAL HYSTERECTOMY     ATRIAL FIBRILLATION ABLATION N/A 03/03/2021   Procedure: ATRIAL FIBRILLATION ABLATION;  Surgeon: Thompson Grayer, MD;  Location: [place] [facility];  Service: Cardiovascular;  Laterality: N/A;   BREAST LUMPECTOMY Left 2008   BREAST LUMPECTOMY WITH RADIOACTIVE SEED LOCALIZATION Left 12/05/2017   Procedure: BREAST LUMPECTOMY WITH RADIOACTIVE SEED LOCALIZATION;  Surgeon: Rolm Bookbinder, MD;  Location: [place];  Service: General;  Laterality: Left;   BREAST RECONSTRUCTION Left 2019   no BP on Lt arm   CARDIOVERSION N/A 12/01/2020   Procedure: CARDIOVERSION;  Surgeon: Larey Dresser, MD;  Location: [facility] ENDOSCOPY;  Service: Cardiovascular;  Laterality: N/A;   CESAREAN SECTION      x 3   KNEE ARTHROSCOPY Right 2007   TEE WITHOUT CARDIOVERSION N/A 12/01/2020   Procedure: TRANSESOPHAGEAL  ECHOCARDIOGRAM (TEE);  Surgeon: Larey Dresser, MD;  Location: [facility] ENDOSCOPY;  Service: Cardiovascular;  Laterality: N/A;   TOTAL HIP ARTHROPLASTY Left 06/15/2019   Procedure: LEFT TOTAL HIP ARTHROPLASTY ANTERIOR APPROACH;  Surgeon: Mcarthur Rossetti, MD;  Location: [facility];  Service: Orthopedics;  Laterality: Left;   Patient Active Problem List   Diagnosis Date Noted   Paroxysmal atrial fibrillation ([place]) 11/25/2020   Unilateral primary osteoarthritis, left hip 06/15/2019   Status post total replacement of left hip 06/15/2019   OSA (obstructive sleep apnea) 02/13/2018   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 01/03/2018   Family history of prostate cancer 01/02/2018   Genetic testing 12/15/2017   Primary malignant neoplasm of lower outer quadrant of female breast, left ([place]) 12/14/2017   History of therapeutic radiation 11/01/2017   Ductal carcinoma in situ (DCIS) of left breast 10/24/2017   Chest pain with moderate risk of acute coronary syndrome 11/03/2016   Atrial fibrillation with rapid ventricular response ([place]) 11/02/2016   History of invasive breast cancer 11/06/2012    PCP: Carol Ada, MD   REFERRING PROVIDER: Trula Slade, DPM   REFERRING DIAG: [phone] (ICD-10-CM) - Tendonitis, Achilles, right   THERAPY DIAG:  Pain in right ankle and joints of right foot  Muscle weakness (generalized)  Difficulty in walking, not elsewhere classified  Chronic pain of right knee  Chronic pain of left knee   SUBJECTIVE:    SUBJECTIVE STATEMENT: C/o soreness in the back of heel   PERTINENT HISTORY: Afib, hx breast cancer, Lt hip replacement   PAIN:  Are you having pain? No NPRS scale: 0/10 Pain location: heel Pain orientation: Right  PAIN TYPE: aching and sore Pain description:  chronic   Aggravating factors: walking, prolonged standing Relieving factors: boot, therapy was helping   PRECAUTIONS: None   WEIGHT BEARING RESTRICTIONS No     PATIENT GOALS improve pain, wants to go to [facility] in September     OBJECTIVE:    DIAGNOSTIC FINDINGS: MRI: Severe distal Achilles tendinosis with mild peritendinitis. Small focal low to intermediate grade partial tear at the deep surface attachment on the calcaneus. Mild retrocalcaneal bursitis and adjacent bony edema in the posterior calcaneus.   PATIENT SURVEYS:  10/21/21 FOTO 60 (predicted 67)   LE AROM/PROM:   A/PROM Right 10/21/2021 Right 11/02/2021  Hip flexion      Hip extension      Hip abduction      Hip adduction      Hip internal rotation      Hip external rotation      Knee flexion      Knee extension      Ankle dorsiflexion A 0 P 8 A 8   Ankle plantarflexion A 58 A 48   Ankle inversion A 25 A 30   Ankle eversion A 18 A 25    (Blank rows = not tested)   LE MMT:   MMT Right 10/21/2021   Hip flexion      Hip extension      Hip abduction      Hip adduction      Hip internal rotation      Hip external rotation      Knee flexion      Knee extension      Ankle dorsiflexion 5/5    Ankle plantarflexion 3+/5    Ankle inversion 5/5    Ankle eversion 5/5     (Blank rows = not tested)      TODAY'S TREATMENT 11/05/21 Therapeutic Exercise:  Seated:   Heel raise with 20# KB on thigh x20 reps  Standing: Heel raises with light UE support X 20 with slow eccentrics on slant board,  Gastroc stretch 3x30 sec on slantboard Soleus stretch on Rt 3x30 sec RLE on 4" step with Lt heel taps 2x10 reps, cues for DF on Rt Tandem Walking (intermittent UE support) x 5 laps Side Shuffle x 2 laps with UE support at counter SLS on foam 5x15 sec; Rt    11/02/21 Therapeutic Exercise:  Supine: Rt ankle PROM into DF  Seated: Baps board for Rt ankle L4 X 10 for sup/pro, and circles CW,CCW. Then added 5# for BAPS board PF 2X15. Leg press DL 75#2X15. Sustained PF hold 3x30 sec with blue and green band together  Standing: Heel raises with light UE support X 20 with slow  eccentrics on slant board,  Standing lunge stretch with Rt foot on slantboard  sec X10 Gastroc stretch 3x30 sec on slantboard Soleus stretch on Rt 3x30 sec Rt leg on 4 inch steps performing Lt leg heel taps to strengthen Rt knee and improve Rt ankle DF X10.  10/28/21 Therapeutic Exercise:             Supine: green band for ankle 4 way X 15 ea holding 5 seconds  Seated:                          Ankle 4-way x 20 each, L3 band on Rt                         Heel lift 10 x 5 sec hold             Standing: Heel raises with light UE support X 20 with slow eccentrics on slant board,  Standing lunge stretch with Rt foot on slantboard  sec X10 Gastroc stretch 3x30 sec on slantboard Soleus stretch on Rt 3x30 sec SLS on Rt 3x15 sec on level ground, on foam 3x15 sec with intermittent UE support Side shuffle at counter x 2 laps Sustained PF hold 3x30 sec    PATIENT EDUCATION:  Education details: HEP Person educated: Patient Education method: Consulting civil engineer, Media planner, and Handouts Education comprehension: verbalized understanding, returned demonstration, and needs further education     HOME EXERCISE PROGRAM: Access Code: [email or web address] URL: [email or web address] Date: 10/21/2021 Prepared by: Faustino Congress   Exercises Gastroc Stretch on Wall - 2 x daily - 7 x weekly - 1 sets - 3 reps - 30 sec hold Soleus Stretch on Wall - 2 x daily - 7 x weekly - 1 sets - 3 reps - 30 sec hold Heel Raise on Step - 2 x daily - 7 x weekly - 1-2 sets - 10 reps - 1-2 sec hold Calf Mobilization with Small Ball - 2 x daily - 7 x weekly - 1 sets - 1 reps - 3-5 min hold Seated Arch Lifts - 2 x daily - 7 x weekly - 1 sets - 10 reps - 5 sec hold hold   Patient Education Trigger Point Dry Needling   ASSESSMENT:   CLINICAL IMPRESSION: Pt overall doing well with PT, and feels she can transition to home program after next visit.  Will plan to assess goals and hold PT next visit.    REHAB  POTENTIAL: Good   CLINICAL DECISION MAKING: Stable/uncomplicated   EVALUATION COMPLEXITY: Low     GOALS: Goals reviewed with patient? Yes   SHORT TERM GOALS:   STG Name Target Date Goal status  1 Independent with initial HEP Baseline:  11/11/2021 MET    LONG TERM GOALS:    LTG Name Target Date Goal status  1 Independent with final HEP Baseline: 12/02/2021 ongoing  2 FOTO score improved to 67 for improved function Baseline: 12/02/2021 ongoing  3 Rt ankle active DF improved to 5 deg for improved function Baseline: 12/02/2021 ongoing  4 Report pain < 2/10 with walking outside boot for improved function  Baseline: 12/02/2021 ongoing  5 Demonstrate 5/5 Rt DF strength for improved mobility Baseline: 12/02/2021 ongoing      PLAN: PT FREQUENCY: 2x/week   PT DURATION: 6 weeks   PLANNED INTERVENTIONS: Therapeutic exercises, Therapeutic activity, Neuro Muscular re-education, Balance training, Gait training, Patient/Family education, Joint mobilization, Stair training, Dry Needling, Electrical stimulation, Cryotherapy, Moist heat, Taping, Vasopneumatic device, Ultrasound, Ionotophoresis 105m/ml Dexamethasone, and Manual therapy   PLAN FOR NEXT SESSION: check goals, plan for holding PT   SFaustino Congress PT,DPT 11/05/2021, 2:18 PM

## 2021-11-09 ENCOUNTER — Ambulatory Visit (INDEPENDENT_AMBULATORY_CARE_PROVIDER_SITE_OTHER): Payer: Medicare Other | Admitting: Physical Therapy

## 2021-11-09 ENCOUNTER — Other Ambulatory Visit: Payer: Self-pay

## 2021-11-09 ENCOUNTER — Encounter: Payer: Self-pay | Admitting: Physical Therapy

## 2021-11-09 DIAGNOSIS — M6281 Muscle weakness (generalized): Secondary | ICD-10-CM

## 2021-11-09 DIAGNOSIS — M25571 Pain in right ankle and joints of right foot: Secondary | ICD-10-CM

## 2021-11-09 DIAGNOSIS — R262 Difficulty in walking, not elsewhere classified: Secondary | ICD-10-CM | POA: Diagnosis not present

## 2021-11-09 NOTE — Therapy (Signed)
OUTPATIENT PHYSICAL THERAPY TREATMENT NOTE   Patient Name: Linda Foster MRN: [id number] DOB:1956/09/08, 66 y.o., female Today's Date: 11/09/2021  PCP: Carol Ada, MD REFERRING PROVIDER: Trula Slade, DPM   PT End of Session - 11/09/21 [phone]     Visit Number 6    Number of Visits 12    Date for PT Re-Evaluation 12/02/21    PT Start Time 0928    PT Stop Time 0955    PT Time Calculation (min) 27 min    Activity Tolerance Patient tolerated treatment well    Behavior During Therapy [facility] for tasks assessed/performed                Past Medical History:  Diagnosis Date   Breast cancer ([place]) 2008   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   Breast cancer ([place]) 2019   left ER/PR +,  XRT and chemotherapy   Family history of adverse reaction to anesthesia    N&V   Family history of prostate cancer    Lymphedema 2013   OSA (obstructive sleep apnea)    uses a dental appliance   Osteoarthritis    Persistent atrial fibrillation ([place])    History of Afib   PONV (postoperative nausea and vomiting)    Past Surgical History:  Procedure Laterality Date   ABDOMINAL HYSTERECTOMY     ATRIAL FIBRILLATION ABLATION N/A 03/03/2021   Procedure: ATRIAL FIBRILLATION ABLATION;  Surgeon: Thompson Grayer, MD;  Location: [place] [facility];  Service: Cardiovascular;  Laterality: N/A;   BREAST LUMPECTOMY Left 2008   BREAST LUMPECTOMY WITH RADIOACTIVE SEED LOCALIZATION Left 12/05/2017   Procedure: BREAST LUMPECTOMY WITH RADIOACTIVE SEED LOCALIZATION;  Surgeon: Rolm Bookbinder, MD;  Location: [place];  Service: General;  Laterality: Left;   BREAST RECONSTRUCTION Left 2019   no BP on Lt arm   CARDIOVERSION N/A 12/01/2020   Procedure: CARDIOVERSION;  Surgeon: Larey Dresser, MD;  Location: [facility] ENDOSCOPY;  Service: Cardiovascular;  Laterality: N/A;   CESAREAN SECTION      x 3   KNEE ARTHROSCOPY Right 2007   TEE WITHOUT CARDIOVERSION N/A 12/01/2020   Procedure: TRANSESOPHAGEAL  ECHOCARDIOGRAM (TEE);  Surgeon: Larey Dresser, MD;  Location: [facility] ENDOSCOPY;  Service: Cardiovascular;  Laterality: N/A;   TOTAL HIP ARTHROPLASTY Left 06/15/2019   Procedure: LEFT TOTAL HIP ARTHROPLASTY ANTERIOR APPROACH;  Surgeon: Mcarthur Rossetti, MD;  Location: [facility];  Service: Orthopedics;  Laterality: Left;   Patient Active Problem List   Diagnosis Date Noted   Paroxysmal atrial fibrillation ([place]) 11/25/2020   Unilateral primary osteoarthritis, left hip 06/15/2019   Status post total replacement of left hip 06/15/2019   OSA (obstructive sleep apnea) 02/13/2018   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 01/03/2018   Family history of prostate cancer 01/02/2018   Genetic testing 12/15/2017   Primary malignant neoplasm of lower outer quadrant of female breast, left ([place]) 12/14/2017   History of therapeutic radiation 11/01/2017   Ductal carcinoma in situ (DCIS) of left breast 10/24/2017   Chest pain with moderate risk of acute coronary syndrome 11/03/2016   Atrial fibrillation with rapid ventricular response ([place]) 11/02/2016   History of invasive breast cancer 11/06/2012    PCP: Carol Ada, MD   REFERRING PROVIDER: Trula Slade, DPM   REFERRING DIAG: [phone] (ICD-10-CM) - Tendonitis, Achilles, right   THERAPY DIAG:  Muscle weakness (generalized)  Difficulty in walking, not elsewhere classified  Pain in right ankle and joints of right foot  SUBJECTIVE:    SUBJECTIVE STATEMENT: Knee is still bothering her but heel is better    PERTINENT HISTORY: Afib, hx breast cancer, Lt hip replacement   PAIN:  Are you having pain? No NPRS scale: 0/10 Pain location: heel Pain orientation: Right  PAIN TYPE: aching and sore Pain description:  chronic   Aggravating factors: walking, prolonged standing Relieving factors: boot, therapy was helping   PRECAUTIONS: None   WEIGHT BEARING RESTRICTIONS No    PATIENT GOALS improve pain, wants to go  to [facility] in September     OBJECTIVE:    DIAGNOSTIC FINDINGS: MRI: Severe distal Achilles tendinosis with mild peritendinitis. Small focal low to intermediate grade partial tear at the deep surface attachment on the calcaneus. Mild retrocalcaneal bursitis and adjacent bony edema in the posterior calcaneus.   PATIENT SURVEYS:  10/21/21 FOTO 60 (predicted 67) 11/09/21 FOTO 73   LE AROM/PROM:   A/PROM Right 10/21/2021 Right 11/02/2021 Right 11/09/21  Hip flexion       Hip extension       Hip abduction       Hip adduction       Hip internal rotation       Hip external rotation       Knee flexion       Knee extension       Ankle dorsiflexion A 0 P 8 A 8  A 8  Ankle plantarflexion A 58 A 48    Ankle inversion A 25 A 30    Ankle eversion A 18 A 25     (Blank rows = not tested)   LE MMT:   MMT Right 10/21/2021 Right 11/09/21  Hip flexion      Hip extension      Hip abduction      Hip adduction      Hip internal rotation      Hip external rotation      Knee flexion      Knee extension      Ankle dorsiflexion 5/5 5/5   Ankle plantarflexion 3+/5    Ankle inversion 5/5    Ankle eversion 5/5     (Blank rows = not tested)      TODAY'S TREATMENT 11/09/21 Therapeutic Exercise:  Aerobic:   Recumbent bike L2 x 8 min  Standing:    Gastroc stretch 3x30 sec on slantboard  Discussed current HEP and to continue, pt verbalized understanding 11/05/21 Therapeutic Exercise:  Seated:   Heel raise with 20# KB on thigh x20 reps  Standing: Heel raises with light UE support X 20 with slow eccentrics on slant board,  Gastroc stretch 3x30 sec on slantboard Soleus stretch on Rt 3x30 sec RLE on 4" step with Lt heel taps 2x10 reps, cues for DF on Rt Tandem Walking (intermittent UE support) x 5 laps Side Shuffle x 2 laps with UE support at counter SLS on foam 5x15 sec; Rt    11/02/21 Therapeutic Exercise:  Supine: Rt ankle PROM into DF  Seated: Baps board for Rt ankle L4 X 10 for  sup/pro, and circles CW,CCW. Then added 5# for BAPS board PF 2X15. Leg press DL 75#2X15. Sustained PF hold 3x30 sec with blue and green band together  Standing: Heel raises with light UE support X 20 with slow eccentrics on slant board,  Standing lunge stretch with Rt foot on slantboard  sec X10 Gastroc stretch 3x30 sec on slantboard Soleus stretch on Rt 3x30 sec Rt leg on 4 inch steps performing  Lt leg heel taps to strengthen Rt knee and improve Rt ankle DF X10.   PATIENT EDUCATION:  Education details: HEP Person educated: Patient Education method: Consulting civil engineer, Media planner, and Handouts Education comprehension: verbalized understanding, returned demonstration, and needs further education     HOME EXERCISE PROGRAM: Access Code: [email or web address] URL: [email or web address] Date: 10/21/2021 Prepared by: Faustino Congress   Exercises Gastroc Stretch on Wall - 2 x daily - 7 x weekly - 1 sets - 3 reps - 30 sec hold Soleus Stretch on Wall - 2 x daily - 7 x weekly - 1 sets - 3 reps - 30 sec hold Heel Raise on Step - 2 x daily - 7 x weekly - 1-2 sets - 10 reps - 1-2 sec hold Calf Mobilization with Small Ball - 2 x daily - 7 x weekly - 1 sets - 1 reps - 3-5 min hold Seated Arch Lifts - 2 x daily - 7 x weekly - 1 sets - 10 reps - 5 sec hold hold   Patient Education Trigger Point Dry Needling   ASSESSMENT:   CLINICAL IMPRESSION: Pt continues to do well and has met all goals to date.  Will hold PT at this time and d/c if she doesn't return.  If needed, will plan to reassess and tx as needed.   REHAB POTENTIAL: Good   CLINICAL DECISION MAKING: Stable/uncomplicated   EVALUATION COMPLEXITY: Low     GOALS: Goals reviewed with patient? Yes   SHORT TERM GOALS:   STG Name Target Date Goal status  1 Independent with initial HEP Baseline:  11/11/2021 MET    LONG TERM GOALS:    LTG Name Target Date Goal status  1 Independent with final HEP Baseline: 12/02/2021 MET  2 FOTO  score improved to 67 for improved function Baseline: 12/02/2021 MET  3 Rt ankle active DF improved to 5 deg for improved function Baseline: 12/02/2021 MET  4 Report pain < 2/10 with walking outside boot for improved function  Baseline: 12/02/2021 MET  5 Demonstrate 5/5 Rt DF strength for improved mobility Baseline: 12/02/2021 MET      PLAN: PT FREQUENCY: 2x/week   PT DURATION: 6 weeks   PLANNED INTERVENTIONS: Therapeutic exercises, Therapeutic activity, Neuro Muscular re-education, Balance training, Gait training, Patient/Family education, Joint mobilization, Stair training, Dry Needling, Electrical stimulation, Cryotherapy, Moist heat, Taping, Vasopneumatic device, Ultrasound, Ionotophoresis 21m/ml Dexamethasone, and Manual therapy   PLAN FOR NEXT SESSION: pt has met all goals, requesting hold from PT.  Will hold PT x 30 days.   SFaustino Congress PT,DPT 11/09/2021, 9:59 AM

## 2021-11-12 ENCOUNTER — Encounter: Payer: Medicare Other | Admitting: Physical Therapy

## 2021-11-16 ENCOUNTER — Encounter: Payer: Medicare Other | Admitting: Physical Therapy

## 2021-11-19 ENCOUNTER — Encounter: Payer: Medicare Other | Admitting: Physical Therapy

## 2021-11-24 ENCOUNTER — Other Ambulatory Visit: Payer: Self-pay

## 2021-11-24 MED ORDER — ANASTROZOLE 1 MG PO TABS: 1.0000 mg | ORAL_TABLET | Freq: Every day | ORAL | 2 refills | Status: DC

## 2022-02-01 ENCOUNTER — Encounter: Payer: Self-pay | Admitting: Orthopaedic Surgery

## 2022-02-01 ENCOUNTER — Ambulatory Visit (INDEPENDENT_AMBULATORY_CARE_PROVIDER_SITE_OTHER): Payer: Medicare Other | Admitting: Orthopaedic Surgery

## 2022-02-01 VITALS — Ht 65.5 in

## 2022-02-01 DIAGNOSIS — G8929 Other chronic pain: Secondary | ICD-10-CM

## 2022-02-01 DIAGNOSIS — M25561 Pain in right knee: Secondary | ICD-10-CM

## 2022-02-01 DIAGNOSIS — M1711 Unilateral primary osteoarthritis, right knee: Secondary | ICD-10-CM

## 2022-02-01 NOTE — Progress Notes (Signed)
The patient comes in today to discuss knee replacement surgery for her arthritic right knee.  She has tried and failed all forms conservative treatment for the right knee.  She has had arthroscopic intervention as well.  She has a lot of appropriate questions as it relates to knee replacement surgery in terms of the components and the brands that we use as well as robotic assisted versus traditional knee replacement surgery.  She is 66 years old.  She does not have osteoporosis and is not on medications for osteopenia.  Her husband is with her today as well.  We have seen her for her knees before and well-documented severe end-stage arthritis of her right knee and pretty significant arthritis of left knee.  The right knee hurts worse than the left knee.  She has had no acute change in medical status either.  On exam both knees have slight varus malalignment and slight swelling.  Her right knee does have fullness over the pes bursa area.  She says that has been that way for 30 years and it may be a lipoma but has never been imaged in terms of MRI.  She is wondering if that tissue can be removed cosmetically with knee replacement surgery.  I did tell her that I would not recommend a separate surgery for that area of her knee and less it showed that there was something else going on there with an MRI.  However she states that she would rather have a knee replacement done first and that were about the cosmetic aspect of things since there is been that way for 30 years now.  I did share with her knee replacement model.  We described in detail what the surgery involves including a thorough discussion of what to expect from an intraoperative and postoperative standpoint.  We discussed the risk and benefits of surgery as well.  All question concerns were answered and addressed.  We will work on getting this on the schedule soon for a right total knee arthroplasty.

## 2022-02-02 ENCOUNTER — Ambulatory Visit: Payer: Medicare Other | Admitting: Orthopaedic Surgery

## 2022-02-10 ENCOUNTER — Ambulatory Visit: Payer: Medicare Other | Admitting: Orthopaedic Surgery

## 2022-02-15 ENCOUNTER — Ambulatory Visit: Payer: Medicare Other | Admitting: Orthopaedic Surgery

## 2022-02-16 ENCOUNTER — Other Ambulatory Visit: Payer: Self-pay

## 2022-02-17 NOTE — Pre-Procedure Instructions (Signed)
Surgical Instructions    Your procedure is scheduled on Thursday, June 15.  Report to [facility] Main Entrance "A" at 6:40 A.M., then check in with the Admitting office.  Call this number if you have problems the morning of surgery:  [phone]   If you have any questions prior to your surgery date call [phone]: Open Monday-Friday 8am-4pm    Remember:  Do not eat after midnight the night before your surgery  You may drink clear liquids until 5:40AM the morning of your surgery.   Clear liquids allowed are: Water, Non-Citrus Juices (without pulp), Carbonated Beverages, Clear Tea, Black Coffee ONLY (NO MILK, CREAM OR POWDERED CREAMER of any kind), and Gatorade  Patient Instructions  The night before surgery:  No food after midnight. ONLY clear liquids after midnight  The day of surgery (if you do NOT have diabetes):  Drink ONE (1) Pre-Surgery Clear Ensure by 05:40AM the morning of surgery. Drink in one sitting. Do not sip.  This drink was given to you during your hospital  pre-op appointment visit.  Nothing else to drink after completing the  Pre-Surgery Clear Ensure.         If you have questions, please contact your surgeon's office.     Take these medicines the morning of surgery with A SIP OF WATER:  anastrozole (ARIMIDEX)  metoprolol tartrate (LOPRESSOR)   As of today, STOP taking any Aspirin (unless otherwise instructed by your surgeon) Aleve, Naproxen, Ibuprofen, Motrin, Advil, Goody's, BC's, all herbal medications, fish oil, and all vitamins.  [facility] is not responsible for any belongings or valuables. .   Do NOT Smoke (Tobacco/Vaping)  24 hours prior to your procedure  If you use a CPAP at night, you may bring your mask for your overnight stay.   Contacts, glasses, hearing aids, dentures or partials may not be worn into surgery, please bring cases for these belongings   For patients admitted to the hospital, discharge time will be determined by your  treatment team.   Patients discharged the day of surgery will not be allowed to drive home, and someone needs to stay with them for 24 hours.   SURGICAL WAITING ROOM VISITATION Patients having surgery or a procedure in a hospital may have two support people. Children under the age of 4 must have an adult with them who is not the patient. They may stay in the waiting area during the procedure and may switch out with other visitors. If the patient needs to stay at the hospital during part of their recovery, the visitor guidelines for inpatient rooms apply.  Please refer to the [facility] website for the visitor guidelines for Inpatients (after your surgery is over and you are in a regular room).       Special instructions:    Oral Hygiene is also important to reduce your risk of infection.  Remember - BRUSH YOUR TEETH THE MORNING OF SURGERY WITH YOUR REGULAR TOOTHPASTE   [place]- Preparing For Surgery  Before surgery, you can play an important role. Because skin is not sterile, your skin needs to be as free of germs as possible. You can reduce the number of germs on your skin by washing with CHG (chlorahexidine gluconate) Soap before surgery.  CHG is an antiseptic cleaner which kills germs and bonds with the skin to continue killing germs even after washing.     Please do not use if you have an allergy to CHG or antibacterial soaps. If your skin becomes  reddened/irritated stop using the CHG.  Do not shave (including legs and underarms) for at least 48 hours prior to first CHG shower. It is OK to shave your face.  Please follow these instructions carefully.     Shower the NIGHT BEFORE SURGERY and the MORNING OF SURGERY with CHG Soap.   If you chose to wash your hair, wash your hair first as usual with your normal shampoo. After you shampoo, rinse your hair and body thoroughly to remove the shampoo.  Then [employer] and genitals (private parts) with your normal soap and rinse  thoroughly to remove soap.  After that Use CHG Soap as you would any other liquid soap. You can apply CHG directly to the skin and wash gently with a scrungie or a clean washcloth.   Apply the CHG Soap to your body ONLY FROM THE NECK DOWN.  Do not use on open wounds or open sores. Avoid contact with your eyes, ears, mouth and genitals (private parts). Wash Face and genitals (private parts)  with your normal soap.   Wash thoroughly, paying special attention to the area where your surgery will be performed.  Thoroughly rinse your body with warm water from the neck down.  DO NOT shower/wash with your normal soap after using and rinsing off the CHG Soap.  Pat yourself dry with a CLEAN TOWEL.  Wear CLEAN PAJAMAS to bed the night before surgery  Place CLEAN SHEETS on your bed the night before your surgery  DO NOT SLEEP WITH PETS.   Day of Surgery:  Take a shower with CHG soap. Wear Clean/Comfortable clothing the morning of surgery Do not wear jewelry or makeup Do not wear lotions, powders, perfumes/colognes, or deodorant. Do not shave 48 hours prior to surgery.  Men may shave face and neck. Do not bring valuables to the hospital. Do not wear nail polish, gel polish, artificial nails, or any other type of covering on natural nails (fingers and toes) If you have artificial nails or gel coating that need to be removed by a nail salon, please have this removed prior to surgery. Artificial nails or gel coating may interfere with anesthesia's ability to adequately monitor your vital signs. Remember to brush your teeth WITH YOUR REGULAR TOOTHPASTE.    If you received a COVID test during your pre-op visit, it is requested that you wear a mask when out in public, stay away from anyone that may not be feeling well, and notify your surgeon if you develop symptoms. If you have been in contact with anyone that has tested positive in the last 10 days, please notify your surgeon.    Please read over  the following fact sheets that you were given.

## 2022-02-18 ENCOUNTER — Encounter (HOSPITAL_COMMUNITY)
Admission: RE | Admit: 2022-02-18 | Discharge: 2022-02-18 | Disposition: A | Discharge status: Home / Self Care | Payer: Medicare Other | Source: Ambulatory Visit | Attending: Orthopaedic Surgery | Admitting: Orthopaedic Surgery

## 2022-02-18 ENCOUNTER — Other Ambulatory Visit: Payer: Self-pay

## 2022-02-18 ENCOUNTER — Encounter (HOSPITAL_COMMUNITY): Payer: Self-pay

## 2022-02-18 VITALS — BP 128/83 | HR 66 | Temp 98.3°F | Resp 17 | Ht 65.0 in | Wt 214.9 lb

## 2022-02-18 DIAGNOSIS — I428 Other cardiomyopathies: Secondary | ICD-10-CM | POA: Diagnosis not present

## 2022-02-18 DIAGNOSIS — M1711 Unilateral primary osteoarthritis, right knee: Secondary | ICD-10-CM

## 2022-02-18 DIAGNOSIS — I2510 Atherosclerotic heart disease of native coronary artery without angina pectoris: Secondary | ICD-10-CM | POA: Insufficient documentation

## 2022-02-18 DIAGNOSIS — G4733 Obstructive sleep apnea (adult) (pediatric): Secondary | ICD-10-CM | POA: Insufficient documentation

## 2022-02-18 DIAGNOSIS — Z01818 Encounter for other preprocedural examination: Secondary | ICD-10-CM

## 2022-02-18 DIAGNOSIS — I4819 Other persistent atrial fibrillation: Secondary | ICD-10-CM | POA: Diagnosis not present

## 2022-02-18 DIAGNOSIS — Z01812 Encounter for preprocedural laboratory examination: Secondary | ICD-10-CM | POA: Diagnosis not present

## 2022-02-18 DIAGNOSIS — G8929 Other chronic pain: Secondary | ICD-10-CM

## 2022-02-18 LAB — BASIC METABOLIC PANEL
Anion gap: 9 (ref 5–15)
BUN: 14 mg/dL (ref 8–23)
CO2: 29 mmol/L (ref 22–32)
Calcium: 9.7 mg/dL (ref 8.9–10.3)
Chloride: 104 mmol/L (ref 98–111)
Creatinine, Ser: 0.66 mg/dL (ref 0.44–1.00)
GFR, Estimated: >60 mL/min (ref >60)
Glucose, Bld: 103 mg/dL — ABNORMAL HIGH (ref 70–99)
Potassium: 3.9 mmol/L (ref 3.5–5.1)
Sodium: 142 mmol/L (ref 135–145)

## 2022-02-18 LAB — CBC
HCT: 39.2 % (ref 36.0–46.0)
Hemoglobin: 12.9 g/dL (ref 12.0–15.0)
MCH: 29.9 pg (ref 26.0–34.0)
MCHC: 32.9 g/dL (ref 30.0–36.0)
MCV: 90.7 fL (ref 80.0–100.0)
Platelets: 202 10*3/uL (ref 150–400)
RBC: 4.32 MIL/uL (ref 3.87–5.11)
RDW: 13.4 % (ref 11.5–15.5)
WBC: 5.9 10*3/uL (ref 4.0–10.5)
nRBC: 0 % (ref 0.0–0.2)

## 2022-02-18 LAB — SURGICAL PCR SCREEN
MRSA, PCR: NEGATIVE
Staphylococcus aureus: POSITIVE — AB

## 2022-02-18 NOTE — Progress Notes (Signed)
PCP - Dr. Carol Ada Cardiologist - Dr. Rayann Heman  PPM/ICD - n/a  Chest x-ray - n/a EKG - 09/17/21 Stress Test - 06/07/19 ECHO - 09/15/21 Cardiac Cath -denies   Sleep Study - OSA+ CPAP - denies use. Intolerable to pt.   Blood Thinner Instructions: n/a Aspirin Instructions: n/a  ERAS Protcol -Clear liquids until 0640 PRE-SURGERY Ensure or G2- Ensure.  COVID TEST- n/a  Anesthesia review: Yes, hx of A.Fib.   Patient denies shortness of breath, fever, cough and chest pain at PAT appointment   All instructions explained to the patient, with a verbal understanding of the material. Patient agrees to go over the instructions while at home for a better understanding. Patient also instructed to self quarantine after being tested for COVID-19. The opportunity to ask questions was provided.

## 2022-02-19 NOTE — Progress Notes (Signed)
Anesthesia Chart Review:  Follows with EP cardiology for history of persistent A-fib s/p successful ablation 02/2021, OSA intolerant to CPAP, tachycardia mediated cardiomyopathy (EF has normalized with sinus rhythm by echo 1/23).  Last seen by Dr. Rayann Heman 09/17/2021.  Eliquis discontinued at that time due to patient request.  CHA2DS2-VASc score of 2.  17-monthfollow-up recommended.  Preop labs reviewed, unremarkable.  EKG 09/17/2021: Sinus rhythm.  Rate 75.  TTE 09/15/2021:  1. Left ventricular ejection fraction, by estimation, is 60 to 65%. The  left ventricle has normal function. The left ventricle has no regional  wall motion abnormalities. Left ventricular diastolic parameters were  normal.   2. Right ventricular systolic function is normal. The right ventricular  size is normal. There is normal pulmonary artery systolic pressure.   3. Left atrial size was moderately dilated.   4. The mitral valve is normal in structure. Trivial mitral valve  regurgitation. No evidence of mitral stenosis.   5. The aortic valve is tricuspid. There is mild calcification of the  aortic valve. There is mild thickening of the aortic valve. Aortic valve  regurgitation is not visualized. No aortic stenosis is present.   6. The inferior vena cava is normal in size with greater than 50%  respiratory variability, suggesting right atrial pressure of 3 mmHg.   CT cardiac morphology 01/20/2021: IMPRESSION: 1. There is normal pulmonary vein drainage into the left atrium with ostial measurements above.   2. There is no thrombus in the left atrial appendage.   3. The esophagus runs in the left atrial midline and is not in proximity to any of the pulmonary vein ostia.   4. No PFO/ASD.   5. Normal coronary origin. Left dominance.   6. CAC score of 17.9 Which is 66th percentile for age-, race-, and sex-matched controls.   Nuclear stress 06/07/2019: The left ventricular ejection fraction is moderately decreased  (30-44%). Nuclear stress EF: 44%. Diffuse hypokinesis There was no ST segment deviation noted during stress. Defect 1: There is a small defect of mild severity present in the apical anterior and apex location. No ischemia. Likely breast attenuation. This is an intermediate risk study related to reduced calculated EF. No ischemia.   JWynonia MustyMTexas Health Harris Methodist Hospital StephenvilleShort Stay Center/Anesthesiology Phone [phone]/05/2022 3:25 PM

## 2022-02-19 NOTE — Anesthesia Preprocedure Evaluation (Addendum)
Anesthesia Evaluation  Patient identified by MRN, date of birth, ID band Patient awake    Reviewed: Allergy & Precautions, H&P , NPO status , Patient's Chart, lab work & pertinent test results  History of Anesthesia Complications (+) PONV and history of anesthetic complications  Airway Mallampati: II  TM Distance: >3 FB Neck ROM: Full    Dental no notable dental hx. (+) Teeth Intact, Dental Advisory Given   Pulmonary sleep apnea ,    Pulmonary exam normal breath sounds clear to auscultation       Cardiovascular Exercise Tolerance: Good + dysrhythmias Atrial Fibrillation  Rhythm:Regular Rate:Normal     Neuro/Psych negative neurological ROS  negative psych ROS   GI/Hepatic negative GI ROS, Neg liver ROS,   Endo/Other  Morbid obesity  Renal/GU negative Renal ROS  negative genitourinary   Musculoskeletal  (+) Arthritis , Osteoarthritis,    Abdominal   Peds  Hematology negative hematology ROS (+)   Anesthesia Other Findings   Reproductive/Obstetrics negative OB ROS                            Anesthesia Physical Anesthesia Plan  ASA: 3  Anesthesia Plan: General   Post-op Pain Management: Regional block* and Tylenol PO (pre-op)*   Induction: Intravenous  PONV Risk Score and Plan: 4 or greater and Dexamethasone, Midazolam and Ondansetron  Airway Management Planned: LMA  Additional Equipment:   Intra-op Plan:   Post-operative Plan: Extubation in OR  Informed Consent: I have reviewed the patients History and Physical, chart, labs and discussed the procedure including the risks, benefits and alternatives for the proposed anesthesia with the patient or authorized representative who has indicated his/her understanding and acceptance.     Dental advisory given  Plan Discussed with: CRNA  Anesthesia Plan Comments: (Pt refused Spinal. Will proceed with GA.    PAT note by Karoline Caldwell, PA-C: Follows with EP cardiology for history of persistent A-fib s/p successful ablation 02/2021, OSA intolerant to CPAP, tachycardia mediated cardiomyopathy (EF has normalized with sinus rhythm by echo 1/23).  Last seen by Dr. Rayann Heman 09/17/2021.  Eliquis discontinued at that time due to patient request.  CHA2DS2-VASc score of 2.  16-monthfollow-up recommended.  Preop labs reviewed, unremarkable.  EKG 09/17/2021: Sinus rhythm.  Rate 75.  TTE 09/15/2021: 1. Left ventricular ejection fraction, by estimation, is 60 to 65%. The  left ventricle has normal function. The left ventricle has no regional  wall motion abnormalities. Left ventricular diastolic parameters were  normal.  2. Right ventricular systolic function is normal. The right ventricular  size is normal. There is normal pulmonary artery systolic pressure.  3. Left atrial size was moderately dilated.  4. The mitral valve is normal in structure. Trivial mitral valve  regurgitation. No evidence of mitral stenosis.  5. The aortic valve is tricuspid. There is mild calcification of the  aortic valve. There is mild thickening of the aortic valve. Aortic valve  regurgitation is not visualized. No aortic stenosis is present.  6. The inferior vena cava is normal in size with greater than 50%  respiratory variability, suggesting right atrial pressure of 3 mmHg.   CT cardiac morphology 01/20/2021: IMPRESSION: 1. There is normal pulmonary vein drainage into the left atrium with ostial measurements above.  2. There is no thrombus in the left atrial appendage.  3. The esophagus runs in the left atrial midline and is not in proximity to any of the pulmonary  vein ostia.  4. No PFO/ASD.  5. Normal coronary origin. Left dominance.  6. CAC score of 17.9 Which is 66th percentile for age-, race-, and sex-matched controls.  Nuclear stress 06/07/2019: . The left ventricular ejection fraction is moderately decreased  (30-44%). . Nuclear stress EF: 44%. Diffuse hypokinesis . There was no ST segment deviation noted during stress. . Defect 1: There is a small defect of mild severity present in the apical anterior and apex location. No ischemia. Likely breast attenuation. . This is an intermediate risk study related to reduced calculated EF. No ischemia.  )     Anesthesia Quick Evaluation

## 2022-02-23 ENCOUNTER — Telehealth: Payer: Self-pay

## 2022-02-23 NOTE — Telephone Encounter (Signed)
Patient left voice mail concerned about positive PCR screen for staph.  Please call to advise how this is handled.

## 2022-02-23 NOTE — Telephone Encounter (Signed)
Patient called with concerns about positive nasal swab results from pre op lab. Not sure what she should do. Please call patient to advise.

## 2022-02-25 ENCOUNTER — Encounter (HOSPITAL_COMMUNITY): Payer: Self-pay | Admitting: Orthopaedic Surgery

## 2022-02-25 ENCOUNTER — Other Ambulatory Visit: Payer: Self-pay

## 2022-02-25 ENCOUNTER — Encounter (HOSPITAL_COMMUNITY)
Admission: RE | Disposition: A | Discharge status: Home Health | Payer: Self-pay | Source: Home / Self Care | Attending: Orthopaedic Surgery

## 2022-02-25 ENCOUNTER — Observation Stay (HOSPITAL_COMMUNITY)
Admission: RE | Admit: 2022-02-25 | Discharge: 2022-02-26 | Disposition: A | Discharge status: Home Health | Payer: Medicare Other | Attending: Orthopaedic Surgery | Admitting: Orthopaedic Surgery

## 2022-02-25 ENCOUNTER — Observation Stay (HOSPITAL_COMMUNITY): Payer: Medicare Other

## 2022-02-25 ENCOUNTER — Ambulatory Visit (HOSPITAL_BASED_OUTPATIENT_CLINIC_OR_DEPARTMENT_OTHER): Payer: Medicare Other | Admitting: Anesthesiology

## 2022-02-25 ENCOUNTER — Ambulatory Visit (HOSPITAL_COMMUNITY): Payer: Medicare Other | Admitting: Physician Assistant

## 2022-02-25 DIAGNOSIS — M1711 Unilateral primary osteoarthritis, right knee: Secondary | ICD-10-CM | POA: Diagnosis present

## 2022-02-25 DIAGNOSIS — G4730 Sleep apnea, unspecified: Secondary | ICD-10-CM | POA: Diagnosis not present

## 2022-02-25 DIAGNOSIS — Z6835 Body mass index (BMI) 35.0-35.9, adult: Secondary | ICD-10-CM

## 2022-02-25 DIAGNOSIS — Z96642 Presence of left artificial hip joint: Secondary | ICD-10-CM | POA: Diagnosis not present

## 2022-02-25 DIAGNOSIS — Z853 Personal history of malignant neoplasm of breast: Secondary | ICD-10-CM | POA: Diagnosis not present

## 2022-02-25 DIAGNOSIS — Z96651 Presence of right artificial knee joint: Secondary | ICD-10-CM

## 2022-02-25 DIAGNOSIS — G8929 Other chronic pain: Secondary | ICD-10-CM

## 2022-02-25 HISTORY — PX: TOTAL KNEE ARTHROPLASTY: SHX125

## 2022-02-25 SURGERY — ARTHROPLASTY, KNEE, TOTAL
Anesthesia: General | Site: Knee | Laterality: Right

## 2022-02-25 MED ORDER — BUPIVACAINE-EPINEPHRINE (PF) 0.5% -1:200000 IJ SOLN
INTRAMUSCULAR | Status: DC | PRN
  Administered 2022-02-25: 20 mL via PERINEURAL

## 2022-02-25 MED ORDER — HYDROMORPHONE HCL 1 MG/ML IJ SOLN
0.2500 mg | INTRAMUSCULAR | Status: DC | PRN
  Administered 2022-02-25 (×3): 0.5 mg via INTRAVENOUS

## 2022-02-25 MED ORDER — FENTANYL CITRATE (PF) 100 MCG/2ML IJ SOLN
INTRAMUSCULAR | Status: DC | PRN
  Administered 2022-02-25 (×2): 50 ug via INTRAVENOUS

## 2022-02-25 MED ORDER — ONDANSETRON HCL 4 MG/2ML IJ SOLN
INTRAMUSCULAR | Status: DC | PRN
  Administered 2022-02-25: 4 mg via INTRAVENOUS

## 2022-02-25 MED ORDER — HYDROMORPHONE HCL 1 MG/ML IJ SOLN
INTRAMUSCULAR | Status: AC
  Filled 2022-02-25: qty 1

## 2022-02-25 MED ORDER — DOCUSATE SODIUM 100 MG PO CAPS
100.0000 mg | ORAL_CAPSULE | Freq: Two times a day (BID) | ORAL | Status: DC
  Administered 2022-02-25 – 2022-02-26 (×3): 100 mg via ORAL
  Filled 2022-02-25 (×3): qty 1

## 2022-02-25 MED ORDER — FENTANYL CITRATE (PF) 250 MCG/5ML IJ SOLN
INTRAMUSCULAR | Status: AC
  Filled 2022-02-25: qty 5

## 2022-02-25 MED ORDER — METOPROLOL TARTRATE 25 MG PO TABS
50.0000 mg | ORAL_TABLET | Freq: Two times a day (BID) | ORAL | Status: DC
  Administered 2022-02-25 – 2022-02-26 (×2): 50 mg via ORAL
  Filled 2022-02-25 (×2): qty 2

## 2022-02-25 MED ORDER — BUPIVACAINE-EPINEPHRINE (PF) 0.25% -1:200000 IJ SOLN
INTRAMUSCULAR | Status: DC | PRN
  Administered 2022-02-25: 10 mL

## 2022-02-25 MED ORDER — ASPIRIN 81 MG PO CHEW
81.0000 mg | CHEWABLE_TABLET | Freq: Two times a day (BID) | ORAL | Status: DC
  Administered 2022-02-25 – 2022-02-26 (×2): 81 mg via ORAL
  Filled 2022-02-25 (×2): qty 1

## 2022-02-25 MED ORDER — HYDROMORPHONE HCL 2 MG PO TABS: 2.0000 mg | ORAL_TABLET | ORAL | Status: DC | PRN

## 2022-02-25 MED ORDER — CEFAZOLIN SODIUM-DEXTROSE 2-4 GM/100ML-% IV SOLN
2.0000 g | INTRAVENOUS | Status: AC
  Administered 2022-02-25: 2 g via INTRAVENOUS
  Filled 2022-02-25: qty 100

## 2022-02-25 MED ORDER — TRANEXAMIC ACID-NACL 1000-0.7 MG/100ML-% IV SOLN
INTRAVENOUS | Status: AC
  Filled 2022-02-25: qty 100

## 2022-02-25 MED ORDER — BUPIVACAINE-EPINEPHRINE (PF) 0.25% -1:200000 IJ SOLN
INTRAMUSCULAR | Status: AC
Start: 2022-02-25 — End: ?
  Filled 2022-02-25: qty 30

## 2022-02-25 MED ORDER — PROPOFOL 10 MG/ML IV BOLUS
INTRAVENOUS | Status: AC
  Filled 2022-02-25: qty 20

## 2022-02-25 MED ORDER — ONDANSETRON HCL 4 MG/2ML IJ SOLN: 4.0000 mg | Freq: Four times a day (QID) | INTRAMUSCULAR | Status: DC | PRN

## 2022-02-25 MED ORDER — ONDANSETRON HCL 4 MG PO TABS: 4.0000 mg | ORAL_TABLET | Freq: Four times a day (QID) | ORAL | Status: DC | PRN

## 2022-02-25 MED ORDER — POVIDONE-IODINE 10 % EX SWAB: 2.0000 "application " | Freq: Once | CUTANEOUS | Status: DC

## 2022-02-25 MED ORDER — SODIUM CHLORIDE 0.9 % IV SOLN: INTRAVENOUS | Status: DC

## 2022-02-25 MED ORDER — DEXAMETHASONE SODIUM PHOSPHATE 10 MG/ML IJ SOLN
INTRAMUSCULAR | Status: DC | PRN
  Administered 2022-02-25: 10 mg via INTRAVENOUS

## 2022-02-25 MED ORDER — MENTHOL 3 MG MT LOZG: 1.0000 | LOZENGE | OROMUCOSAL | Status: DC | PRN

## 2022-02-25 MED ORDER — LACTATED RINGERS IV SOLN: INTRAVENOUS | Status: DC

## 2022-02-25 MED ORDER — OXYCODONE HCL 5 MG PO TABS
5.0000 mg | ORAL_TABLET | ORAL | Status: DC | PRN
  Administered 2022-02-25 (×2): 10 mg via ORAL
  Administered 2022-02-26: 5 mg via ORAL
  Administered 2022-02-26 (×3): 10 mg via ORAL
  Filled 2022-02-25 (×4): qty 2
  Filled 2022-02-25: qty 1
  Filled 2022-02-25: qty 2

## 2022-02-25 MED ORDER — FENTANYL CITRATE (PF) 250 MCG/5ML IJ SOLN
INTRAMUSCULAR | Status: DC | PRN
Start: 2022-02-25 — End: 2022-02-25
  Administered 2022-02-25 (×2): 25 ug via INTRAVENOUS
  Administered 2022-02-25 (×3): 50 ug via INTRAVENOUS

## 2022-02-25 MED ORDER — POLYETHYLENE GLYCOL 3350 17 G PO PACK: 17.0000 g | PACK | Freq: Every day | ORAL | Status: DC | PRN

## 2022-02-25 MED ORDER — TRANEXAMIC ACID-NACL 1000-0.7 MG/100ML-% IV SOLN
INTRAVENOUS | Status: DC | PRN
  Administered 2022-02-25: 1000 mg via INTRAVENOUS

## 2022-02-25 MED ORDER — FUROSEMIDE 40 MG PO TABS
40.0000 mg | ORAL_TABLET | Freq: Every day | ORAL | Status: DC
  Administered 2022-02-25: 40 mg via ORAL
  Filled 2022-02-25: qty 1

## 2022-02-25 MED ORDER — VITAMIN D 25 MCG (1000 UNIT) PO TABS
1000.0000 [IU] | ORAL_TABLET | Freq: Every day | ORAL | Status: DC
  Administered 2022-02-25 – 2022-02-26 (×2): 1000 [IU] via ORAL
  Filled 2022-02-25 (×2): qty 1

## 2022-02-25 MED ORDER — METOCLOPRAMIDE HCL 5 MG PO TABS: 5.0000 mg | ORAL_TABLET | Freq: Three times a day (TID) | ORAL | Status: DC | PRN

## 2022-02-25 MED ORDER — ACETAMINOPHEN 325 MG PO TABS
325.0000 mg | ORAL_TABLET | Freq: Four times a day (QID) | ORAL | Status: DC | PRN
  Administered 2022-02-26: 650 mg via ORAL
  Filled 2022-02-25: qty 2

## 2022-02-25 MED ORDER — PROPOFOL 1000 MG/100ML IV EMUL
INTRAVENOUS | Status: AC
Start: 2022-02-25 — End: ?
  Filled 2022-02-25: qty 100

## 2022-02-25 MED ORDER — ORAL CARE MOUTH RINSE: 15.0000 mL | Freq: Once | OROMUCOSAL | Status: AC

## 2022-02-25 MED ORDER — ACETAMINOPHEN 500 MG PO TABS
1000.0000 mg | ORAL_TABLET | Freq: Once | ORAL | Status: AC
  Administered 2022-02-25: 1000 mg via ORAL
  Filled 2022-02-25: qty 2

## 2022-02-25 MED ORDER — ONDANSETRON HCL 4 MG/2ML IJ SOLN
INTRAMUSCULAR | Status: AC
  Filled 2022-02-25: qty 2

## 2022-02-25 MED ORDER — MIDAZOLAM HCL 2 MG/2ML IJ SOLN
INTRAMUSCULAR | Status: AC
  Filled 2022-02-25: qty 2

## 2022-02-25 MED ORDER — HYDROMORPHONE HCL 1 MG/ML IJ SOLN: 0.5000 mg | INTRAMUSCULAR | Status: DC | PRN

## 2022-02-25 MED ORDER — PROPOFOL 10 MG/ML IV BOLUS
INTRAVENOUS | Status: DC | PRN
  Administered 2022-02-25: 130 mg via INTRAVENOUS

## 2022-02-25 MED ORDER — MIDAZOLAM HCL 2 MG/2ML IJ SOLN
INTRAMUSCULAR | Status: DC | PRN
  Administered 2022-02-25: 2 mg via INTRAVENOUS

## 2022-02-25 MED ORDER — METHOCARBAMOL 1000 MG/10ML IJ SOLN: 500.0000 mg | Freq: Four times a day (QID) | INTRAVENOUS | Status: DC | PRN

## 2022-02-25 MED ORDER — DEXAMETHASONE SODIUM PHOSPHATE 10 MG/ML IJ SOLN
INTRAMUSCULAR | Status: AC
Start: 2022-02-25 — End: ?
  Filled 2022-02-25: qty 1

## 2022-02-25 MED ORDER — PANTOPRAZOLE SODIUM 40 MG PO TBEC
40.0000 mg | DELAYED_RELEASE_TABLET | Freq: Every day | ORAL | Status: DC
  Administered 2022-02-25 – 2022-02-26 (×2): 40 mg via ORAL
  Filled 2022-02-25 (×2): qty 1

## 2022-02-25 MED ORDER — PHENYLEPHRINE HCL-NACL 20-0.9 MG/250ML-% IV SOLN
INTRAVENOUS | Status: DC | PRN
  Administered 2022-02-25: 25 ug/min via INTRAVENOUS

## 2022-02-25 MED ORDER — ALUM & MAG HYDROXIDE-SIMETH 200-200-20 MG/5ML PO SUSP: 30.0000 mL | ORAL | Status: DC | PRN

## 2022-02-25 MED ORDER — CHLORHEXIDINE GLUCONATE 0.12 % MT SOLN
15.0000 mL | Freq: Once | OROMUCOSAL | Status: AC
  Administered 2022-02-25: 15 mL via OROMUCOSAL
  Filled 2022-02-25: qty 15

## 2022-02-25 MED ORDER — SODIUM CHLORIDE 0.9 % IR SOLN
Status: DC | PRN
  Administered 2022-02-25: 3000 mL

## 2022-02-25 MED ORDER — METHOCARBAMOL 500 MG PO TABS
500.0000 mg | ORAL_TABLET | Freq: Four times a day (QID) | ORAL | Status: DC | PRN
  Administered 2022-02-25 – 2022-02-26 (×3): 500 mg via ORAL
  Filled 2022-02-25 (×3): qty 1

## 2022-02-25 MED ORDER — FENTANYL CITRATE (PF) 100 MCG/2ML IJ SOLN
INTRAMUSCULAR | Status: AC
  Filled 2022-02-25: qty 2

## 2022-02-25 MED ORDER — DIPHENHYDRAMINE HCL 12.5 MG/5ML PO ELIX: 12.5000 mg | ORAL_SOLUTION | ORAL | Status: DC | PRN

## 2022-02-25 MED ORDER — ANASTROZOLE 1 MG PO TABS
1.0000 mg | ORAL_TABLET | Freq: Every day | ORAL | Status: DC
  Administered 2022-02-25 – 2022-02-26 (×2): 1 mg via ORAL
  Filled 2022-02-25 (×2): qty 1

## 2022-02-25 MED ORDER — METOCLOPRAMIDE HCL 5 MG/ML IJ SOLN
5.0000 mg | Freq: Three times a day (TID) | INTRAMUSCULAR | Status: DC | PRN
  Administered 2022-02-25: 10 mg via INTRAVENOUS
  Filled 2022-02-25: qty 2

## 2022-02-25 MED ORDER — 0.9 % SODIUM CHLORIDE (POUR BTL) OPTIME
TOPICAL | Status: DC | PRN
  Administered 2022-02-25: 1000 mL

## 2022-02-25 MED ORDER — STERILE WATER FOR IRRIGATION IR SOLN
Status: DC | PRN
  Administered 2022-02-25: 1000 mL

## 2022-02-25 MED ORDER — PHENOL 1.4 % MT LIQD: 1.0000 | OROMUCOSAL | Status: DC | PRN

## 2022-02-25 MED ORDER — LIDOCAINE 2% (20 MG/ML) 5 ML SYRINGE
INTRAMUSCULAR | Status: DC | PRN
  Administered 2022-02-25: 40 mg via INTRAVENOUS

## 2022-02-25 MED ORDER — VANCOMYCIN HCL IN DEXTROSE 1-5 GM/200ML-% IV SOLN
1000.0000 mg | Freq: Two times a day (BID) | INTRAVENOUS | Status: AC
  Administered 2022-02-25: 1000 mg via INTRAVENOUS
  Filled 2022-02-25: qty 200

## 2022-02-25 SURGICAL SUPPLY — 67 items
BANDAGE ESMARK 6X9 LF (GAUZE/BANDAGES/DRESSINGS) ×1 IMPLANT
BLADE SAG 18X100X1.27 (BLADE) ×2 IMPLANT
BNDG CMPR 9X6 STRL LF SNTH (GAUZE/BANDAGES/DRESSINGS) ×1
BNDG ELASTIC 6X5.8 VLCR STR LF (GAUZE/BANDAGES/DRESSINGS) ×3 IMPLANT
BNDG ESMARK 6X9 LF (GAUZE/BANDAGES/DRESSINGS) ×2
BOWL SMART MIX CTS (DISPOSABLE) ×2 IMPLANT
BSPLAT TIB 5D D CMNT STM RT (Knees) ×1 IMPLANT
CEMENT BONE R 1X40 (Cement) ×3 IMPLANT
COMP FEM CMT PERSONA SZ9 RT (Joint) ×2 IMPLANT
COMP PATELLA 32 STD 8.5 THK (Orthopedic Implant) IMPLANT
COMPONENT FEM CMT PRSONA SZ9RT (Joint) IMPLANT
COOLER ICEMAN CLASSIC (MISCELLANEOUS) ×1 IMPLANT
COVER SURGICAL LIGHT HANDLE (MISCELLANEOUS) ×2 IMPLANT
CUFF TOURN SGL QUICK 34 (TOURNIQUET CUFF) ×2
CUFF TRNQT CYL 34X4.125X (TOURNIQUET CUFF) ×1 IMPLANT
DRAPE EXTREMITY T 121X128X90 (DISPOSABLE) ×2 IMPLANT
DRAPE HALF SHEET 40X57 (DRAPES) ×3 IMPLANT
DRAPE U-SHAPE 47X51 STRL (DRAPES) ×2 IMPLANT
DRSG PAD ABDOMINAL 8X10 ST (GAUZE/BANDAGES/DRESSINGS) ×2 IMPLANT
DURAPREP 26ML APPLICATOR (WOUND CARE) ×2 IMPLANT
ELECT CAUTERY BLADE 6.4 (BLADE) ×2 IMPLANT
ELECT REM PT RETURN 9FT ADLT (ELECTROSURGICAL) ×2
ELECTRODE REM PT RTRN 9FT ADLT (ELECTROSURGICAL) ×1 IMPLANT
FACESHIELD WRAPAROUND (MASK) ×6 IMPLANT
FACESHIELD WRAPAROUND OR TEAM (MASK) ×2 IMPLANT
GAUZE SPONGE 4X4 12PLY STRL (GAUZE/BANDAGES/DRESSINGS) ×2 IMPLANT
GAUZE XEROFORM 1X8 LF (GAUZE/BANDAGES/DRESSINGS) ×2 IMPLANT
GLOVE BIOGEL PI IND STRL 8 (GLOVE) ×2 IMPLANT
GLOVE BIOGEL PI INDICATOR 8 (GLOVE) ×2
GLOVE ORTHO TXT STRL SZ7.5 (GLOVE) ×3 IMPLANT
GLOVE SURG ORTHO 8.0 STRL STRW (GLOVE) ×3 IMPLANT
GOWN STRL REUS W/ TWL XL LVL3 (GOWN DISPOSABLE) ×2 IMPLANT
GOWN STRL REUS W/TWL XL LVL3 (GOWN DISPOSABLE) ×6
HANDPIECE INTERPULSE COAX TIP (DISPOSABLE) ×2
HDLS TROCR DRIL PIN KNEE 75 (PIN) ×2
IMMOBILIZER KNEE 22 UNIV (SOFTGOODS) ×2 IMPLANT
INSERT TIB ASF CD/8-9 12 RT (Insert) ×1 IMPLANT
KIT BASIN OR (CUSTOM PROCEDURE TRAY) ×2 IMPLANT
KIT TURNOVER KIT B (KITS) ×2 IMPLANT
MANIFOLD NEPTUNE II (INSTRUMENTS) ×2 IMPLANT
NEEDLE 22X1 1/2 (OR ONLY) (NEEDLE) ×1 IMPLANT
NS IRRIG 1000ML POUR BTL (IV SOLUTION) ×2 IMPLANT
PACK TOTAL JOINT (CUSTOM PROCEDURE TRAY) ×2 IMPLANT
PAD ARMBOARD 7.5X6 YLW CONV (MISCELLANEOUS) ×2 IMPLANT
PAD COLD SHLDR WRAP-ON (PAD) ×1 IMPLANT
PADDING CAST COTTON 6X4 STRL (CAST SUPPLIES) ×2 IMPLANT
PATELLA ZIMMER 32MM (Orthopedic Implant) ×2 IMPLANT
PIN DRILL HDLS TROCAR 75 4PK (PIN) IMPLANT
SCREW FEMALE HEX FIX 25X2.5 (ORTHOPEDIC DISPOSABLE SUPPLIES) ×1 IMPLANT
SET HNDPC FAN SPRY TIP SCT (DISPOSABLE) ×1 IMPLANT
SET PAD KNEE POSITIONER (MISCELLANEOUS) ×2 IMPLANT
STAPLER VISISTAT 35W (STAPLE) ×1 IMPLANT
STEM TIBIA 5 DEG SZ D R KNEE (Knees) IMPLANT
STRIP CLOSURE SKIN 1/2X4 (GAUZE/BANDAGES/DRESSINGS) IMPLANT
SUCTION FRAZIER HANDLE 10FR (MISCELLANEOUS) ×2
SUCTION TUBE FRAZIER 10FR DISP (MISCELLANEOUS) ×1 IMPLANT
SUT MNCRL AB 4-0 PS2 18 (SUTURE) IMPLANT
SUT VIC AB 0 CT1 27 (SUTURE) ×2
SUT VIC AB 0 CT1 27XBRD ANBCTR (SUTURE) ×1 IMPLANT
SUT VIC AB 1 CT1 27 (SUTURE) ×8
SUT VIC AB 1 CT1 27XBRD ANBCTR (SUTURE) ×2 IMPLANT
SUT VIC AB 2-0 CT1 27 (SUTURE) ×4
SUT VIC AB 2-0 CT1 TAPERPNT 27 (SUTURE) ×2 IMPLANT
TIBIA STEM 5 DEG SZ D R KNEE (Knees) ×2 IMPLANT
TOWEL GREEN STERILE (TOWEL DISPOSABLE) ×2 IMPLANT
TOWEL GREEN STERILE FF (TOWEL DISPOSABLE) ×2 IMPLANT
WRAP KNEE MAXI GEL POST OP (GAUZE/BANDAGES/DRESSINGS) ×2 IMPLANT

## 2022-02-25 NOTE — OR Nursing (Signed)
Ice man sent to PACU

## 2022-02-25 NOTE — Transfer of Care (Signed)
Immediate Anesthesia Transfer of Care Note  Patient: Linda Foster  Procedure(s) Performed: RIGHT TOTAL KNEE ARTHROPLASTY (Right: Knee)  Patient Location: PACU  Anesthesia Type:General  Level of Consciousness: awake, alert  and oriented  Airway & Oxygen Therapy: Patient Spontanous Breathing  Post-op Assessment: Report given to RN, Post -op Vital signs reviewed and stable and Patient moving all extremities X 4  Post vital signs: Reviewed and stable  Last Vitals:  Vitals Value Taken Time  BP 124/90 02/25/22 1100  Temp 36.5 C 02/25/22 1100  Pulse 68 02/25/22 1104  Resp 14 02/25/22 1104  SpO2 99 % 02/25/22 1104  Vitals shown include unvalidated device data.  Last Pain:  Vitals:   02/25/22 0710  TempSrc:   PainSc: 0-No pain         Complications: No notable events documented.

## 2022-02-25 NOTE — Discharge Instructions (Signed)

## 2022-02-25 NOTE — Op Note (Signed)
NAME: Linda Foster, Linda Foster MEDICAL RECORD NO: [id number] ACCOUNT NO: [id number] DATE OF BIRTH: May 18, 1956 FACILITY: [facility] LOCATION: [facility] PHYSICIAN: Lind Guest. Ninfa Linden, MD  Operative Report   DATE OF PROCEDURE: 02/25/2022  PREOPERATIVE DIAGNOSIS:  Primary osteoarthritis and degenerative joint disease, right knee.  POSTOPERATIVE DIAGNOSIS:  Primary osteoarthritis and degenerative joint disease, right knee.  PROCEDURE:  Right cemented total knee arthroplasty.  IMPLANTS:  Biomet/Zimmer Persona cemented knee system with size 9 right standard CR femur, size D right tibial tray, 12 mm; right medial congruent fixed bearing polyethylene insert, size 32 patellar button.  SURGEON:  Lind Guest. Ninfa Linden, MD  ASSISTANT:  Erskine Emery, PA-C  ANESTHESIA:   1.  Regional right lower extremity adductor canal block. 2.  General. 3.  0.25% Marcaine with epinephrine around the arthrotomy as local.  TOURNIQUET TIME:  81 minutes.  ESTIMATED BLOOD LOSS:  100 mL  ANTIBIOTICS:  1 g IV vancomycin.  BLOOD LOSS:  709 mL  COMPLICATIONS:  None.  INDICATIONS:  The patient is a 66 year old female with debilitating arthritis involving her right knee that has been well documented with clinical exam as well as x-rays.  She has had arthroscopic intervention years ago of that knee.  At this point, her  right knee pain is daily and is detrimentally affecting her mobility, her quality of life and her activities of daily living.  She has tried steroid injections and quad strengthening exercises as well as activity modification and hyaluronic acid  injections.  With the failure to conservative treatment and continued 10/10 daily right knee pain we have recommended a total knee arthroplasty.  We had a long and thorough discussion about the risk of acute blood loss anemia, nerve or vessel injury,  fracture, infection, implant failure, DVT and skin and soft tissue issues.  We talked about our goals being decreased  pain, improve mobility and overall improve quality of life.  DESCRIPTION OF PROCEDURE:  After informed consent was obtained, appropriate right knee was marked.  Anesthesia was obtained, an adductor canal block of the right lower extremity in the holding room.  She was then brought to the operating room and placed  supine on the operating table where general anesthesia was then obtained.  A nonsterile tourniquet was placed around her upper right thigh.  Her right thigh, knee, leg, ankle and foot were prepped and draped with DuraPrep and sterile drapes.  A timeout  was called.  She was identified as correct patient, correct right knee.  We then used Esmarch to wrap that leg and tourniquet was inflated to 300 mm of pressure.  We then made a direct midline incision over the patella and carried this proximally and  distally.  We dissected down the knee joint, carried out a medial parapatellar arthrotomy, finding a moderate joint effusion and osteophytes in all three compartments.  We flexed the knee and removed remnants of the ACL, medial and lateral meniscus as  well as osteophytes from all 3 compartments.  I performed a lateral release and the patella was still quite tight, so we went ahead and made our patellar cut, setting this for taking 9 mm off the undersurface of the patella.  We then went back to the  tibia with the knee in a flexed position using extramedullary cutting guide for making our proximal tibia cut and correcting for varus and valgus and a 3-degree slope.  We made this cut to take 2 mm off the low side.  Once we made this cut we felt  like  we needed to back down 2 more millimeters, which we did.  We then went to the femur and used an intramedullary cutting guide for the femur.  We set this for a 10 mm distal femoral cut for a right knee at 5 degrees externally rotated.  We made that cut  without difficulty, and brought the knee back down to full extension and achieved full extension with a 10  mm extension block.  We then went back to the femur and put our femoral sizing guide based off the epicondylar axis and Whitesides line.  Based off  of this, we chose a size 9 femur.  We put a 4-in-1 cutting block for a size 9 femur, made our anterior and posterior cuts, followed by our chamfer cuts.  We then went back to the tibia and chose a size D right tibial tray for coverage on the tibial  plateau.  We set the rotation of the tibial tubercle and the femur and then we did our drill hole and keel punch off of this.  With the trial D right tibia, we trialed a right 9 femur and placed our 10 mm polyethylene insert, which was a medial congruent  insert for right knee and decided to go up to a 12 mm insert and with a 12 mm insert we felt that our extension was good as well as her flexion and the knee was stable on ligamentous exam.  We then sized for a size 32 patella and drilled three holes for  a size 32 patellar button.  We also did our lug holes.  We were pleased with range of motion and stability with the trial implants in place and removed the trial implants.  We mixed our cement and then cemented with the knee in a flexed position our  Biomet Zimmer Persona tibial tray, size D for right knee followed by cementing our size 9 right femur.  We placed our 12 mm medial congruent polyethylene insert and cemented our size 32 patellar button.  We then held the knee fully extended and  compressed while the cement hardened.  Once it hardened, we put the knee through several cycles of motion.  We were pleased with motion again and stability.  We then irrigated the soft tissue with normal saline solution using pulsatile lavage.  We let  the tourniquet down.  Hemostasis was obtained with electrocautery.  We then closed the arthrotomy with #1 Vicryl suture, followed by 0 Vicryls to close the deep tissue and 2-0 Vicryl to close the subcutaneous tissue.  The skin was closed with staples.  A  well-padded sterile  dressing was applied.  She was awakened, extubated, and taken to recovery room in stable condition with all final counts being correct.  No complications noted.  Of note, Benita Stabile, PA-C did assist during the entire case from  beginning to end and his assistance was crucial and medically necessary for retracting soft tissues, helping guide implant placement and directly involved with a layered closure of the wound.   PUS D: 02/25/2022 10:38:28 am T: 02/25/2022 3:44:00 pm  JOB: [id number]/ [id number]

## 2022-02-25 NOTE — H&P (Signed)
TOTAL KNEE ADMISSION H&P  Patient is being admitted for right total knee arthroplasty.  Subjective:  Chief Complaint:right knee pain.  HPI: Linda Foster, 66 y.o. female, has a history of pain and functional disability in the right knee due to arthritis and has failed non-surgical conservative treatments for greater than 12 weeks to includeNSAID's and/or analgesics, corticosteriod injections, viscosupplementation injections, flexibility and strengthening excercises, supervised PT with diminished ADL's post treatment, use of assistive devices, weight reduction as appropriate, and activity modification.  Onset of symptoms was gradual, starting 4 years ago with gradually worsening course since that time. The patient noted prior procedures on the knee to include  arthroscopy on the right knee(s).  Patient currently rates pain in the right knee(s) at 10 out of 10 with activity. Patient has night pain, worsening of pain with activity and weight bearing, pain that interferes with activities of daily living, pain with passive range of motion, crepitus, and joint swelling.  Patient has evidence of subchondral sclerosis, periarticular osteophytes, and joint space narrowing by imaging studies. There is no active infection.  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, left ([place]) 12/14/2017   History of therapeutic radiation 11/01/2017   Ductal carcinoma in situ (DCIS) of left breast 10/24/2017   Chest pain with moderate  risk of acute coronary syndrome 11/03/2016   Atrial fibrillation with rapid ventricular response ([place]) 11/02/2016   History of invasive breast cancer 11/06/2012   Past Medical History:  Diagnosis Date   Breast cancer ([place]) 2008   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   Breast cancer ([place]) 2019   left ER/PR +,  XRT and chemotherapy   Dysrhythmia    Hx of A.Fib   Family history of adverse reaction to anesthesia    N&V   Family history of prostate cancer    Lymphedema 2013   OSA (obstructive sleep apnea)    uses a dental appliance   Osteoarthritis    Persistent atrial fibrillation ([facility])    History of Afib   PONV (postoperative nausea and vomiting)     Past Surgical History:  Procedure Laterality Date   ABDOMINAL HYSTERECTOMY     ATRIAL FIBRILLATION ABLATION N/A 03/03/2021   Procedure: ATRIAL FIBRILLATION ABLATION;  Surgeon: Thompson Grayer, MD;  Location: [facility];  Service: Cardiovascular;  Laterality: N/A;   BREAST LUMPECTOMY Left 2008   BREAST LUMPECTOMY WITH RADIOACTIVE SEED LOCALIZATION Left 12/05/2017   Procedure: BREAST LUMPECTOMY WITH RADIOACTIVE SEED LOCALIZATION;  Surgeon: Rolm Bookbinder, MD;  Location: [facility];  Service: General;  Laterality: Left;   BREAST RECONSTRUCTION Left 2019   no BP on Lt arm   CARDIOVERSION N/A 12/01/2020   Procedure: CARDIOVERSION;  Surgeon: Larey Dresser, MD;  Location: [facility];  Service: Cardiovascular;  Laterality: N/A;   CESAREAN SECTION      x 3   KNEE ARTHROSCOPY Right 2007   TEE WITHOUT CARDIOVERSION N/A 12/01/2020   Procedure: TRANSESOPHAGEAL ECHOCARDIOGRAM (TEE);  Surgeon: Larey Dresser, MD;  Location: [facility];  Service: Cardiovascular;  Laterality: N/A;   TOTAL HIP ARTHROPLASTY Left 06/15/2019   Procedure: LEFT TOTAL HIP ARTHROPLASTY ANTERIOR APPROACH;  Surgeon: Mcarthur Rossetti, MD;  Location: [facility];  Service: Orthopedics;  Laterality: Left;    Current Facility-Administered Medications   Medication Dose Route Frequency Provider Last Rate Last Admin   ceFAZolin (ANCEF) IVPB 2g/100 mL premix  2 g Intravenous On Call to OR Mcarthur Rossetti, MD       lactated ringers infusion   Intravenous Continuous Belinda Block, MD       povidone-iodine 10 % swab 2 application   2 application  Topical Once Mcarthur Rossetti, MD       Facility-Administered Medications Ordered in Other Encounters  Medication Dose Route Frequency Provider Last Rate Last Admin   bupivacaine-epinephrine (PF) (MARCAINE W/ EPI) 0.5% -1:200000 injection   Peri-NEURAL Anesthesia Intra-op Roderic Palau, MD   20 mL at 02/25/22 0757   fentaNYL (SUBLIMAZE) injection   Intravenous Anesthesia Intra-op Kyung Rudd, CRNA   50 mcg at 02/25/22 0753   midazolam (VERSED) injection   Intravenous Anesthesia Intra-op Kyung Rudd, CRNA   2 mg at 02/25/22 0753   No Known Allergies  Social History   Tobacco Use   Smoking status: Never   Smokeless tobacco: Never  Substance Use Topics   Alcohol use: No    Family History  Problem Relation Age of Onset   Hypertension Mother    COPD Father    Prostate cancer Father 1   Lung cancer Brother 21   Prostate cancer Brother 93   Cancer Cousin        type unk dx. >50   Cancer Cousin 48       type unk   Cancer Cousin        type and age dx unkown     Review of Systems  Musculoskeletal:  Positive for gait problem and joint swelling.  All other systems reviewed and are negative.   Objective:  Physical Exam Vitals reviewed.  Constitutional:      Appearance: Normal appearance.  HENT:     Head: Normocephalic and atraumatic.  Eyes:     Extraocular Movements: Extraocular movements intact.     Pupils: Pupils are equal, round, and reactive to light.  Cardiovascular:     Rate and Rhythm: Normal rate.     Pulses: Normal pulses.  Pulmonary:     Effort: Pulmonary effort is normal.     Breath sounds: Normal breath sounds.  Abdominal:     Palpations:  Abdomen is soft.  Musculoskeletal:     Cervical back: Normal range of motion.     Right knee: Effusion, bony tenderness and crepitus present. Decreased range of motion. Tenderness present over the medial joint line, lateral joint line and patellar tendon. Abnormal alignment and abnormal meniscus.  Neurological:     Mental Status: She is alert and oriented to person, place, and time.  Psychiatric:        Behavior: Behavior normal.     Vital signs in last 24 hours: Temp:  [97.9 F (36.6 C)] 97.9 F (36.6 C) (06/15 0654) Pulse Rate:  [67-75] 67 (06/15 0740) Resp:  [12-20] 12 (06/15 0740) BP: (111-123)/(66-102) 111/79 (06/15 0740) SpO2:  [93 %-100 %] 100 % (06/15 0740) Weight:  [97.1 kg] 97.1 kg (06/15 0654)  Labs:   Estimated body mass index is 35.61 kg/m as calculated from the following:   Height as of this encounter: '5\' 5"'$  (1.651 m).  Weight as of this encounter: 97.1 kg.   Imaging Review Plain radiographs demonstrate severe degenerative joint disease of the right knee(s). The overall alignment ismild varus. The bone quality appears to be good for age and reported activity level.      Assessment/Plan:  End stage arthritis, right knee   The patient history, physical examination, clinical judgment of the provider and imaging studies are consistent with end stage degenerative joint disease of the right knee(s) and total knee arthroplasty is deemed medically necessary. The treatment options including medical management, injection therapy arthroscopy and arthroplasty were discussed at length. The risks and benefits of total knee arthroplasty were presented and reviewed. The risks due to aseptic loosening, infection, stiffness, patella tracking problems, thromboembolic complications and other imponderables were discussed. The patient acknowledged the explanation, agreed to proceed with the plan and consent was signed. Patient is being admitted for inpatient treatment for surgery,  pain control, PT, OT, prophylactic antibiotics, VTE prophylaxis, progressive ambulation and ADL's and discharge planning. The patient is planning to be discharged home with home health services

## 2022-02-25 NOTE — Anesthesia Procedure Notes (Signed)
Anesthesia Regional Block: Adductor canal block   Pre-Anesthetic Checklist: , timeout performed,  Correct Patient, Correct Site, Correct Laterality,  Correct Procedure, Correct Position, site marked,  Risks and benefits discussed,  Pre-op evaluation,  At surgeon's request and post-op pain management  Laterality: Right  Prep: Maximum Sterile Barrier Precautions used, chloraprep       Needles:  Injection technique: Single-shot  Needle Type: Echogenic Stimulator Needle     Needle Length: 9cm  Needle Gauge: 21     Additional Needles:   Procedures:,,,, ultrasound used (permanent image in chart),,    Narrative:  Start time: 02/25/2022 7:47 AM End time: 02/25/2022 7:57 AM Injection made incrementally with aspirations every 5 mL.  Performed by: Personally  Anesthesiologist: Roderic Palau, MD  Additional Notes:

## 2022-02-25 NOTE — Brief Op Note (Signed)
02/25/2022  10:40 AM  PATIENT:  Tora Duck  66 y.o. female  PRE-OPERATIVE DIAGNOSIS:  right knee osteoarthritis  POST-OPERATIVE DIAGNOSIS:  right knee osteoarthritis  PROCEDURE:  Procedure(s): RIGHT TOTAL KNEE ARTHROPLASTY (Right)  SURGEON:  Surgeon(s) and Role:    Mcarthur Rossetti, MD - Primary  PHYSICIAN ASSISTANT:  Benita Stabile, PA-C  ANESTHESIA:   local, regional, and general  EBL:  100 mL   COUNTS:  YES  TOURNIQUET:   Total Tourniquet Time Documented: Thigh (Right) - 83 minutes Total: Thigh (Right) - 83 minutes   DICTATION: .Other Dictation: Dictation Number [id number]  PLAN OF CARE: Admit for overnight observation  PATIENT DISPOSITION:  PACU - hemodynamically stable.   Delay start of Pharmacological VTE agent (>24hrs) due to surgical blood loss or risk of bleeding: no

## 2022-02-25 NOTE — Evaluation (Signed)
Physical Therapy Evaluation Patient Details Name: Linda Foster MRN: [id number] DOB: November 19, 1955 Today's Date: 02/25/2022  History of Present Illness  Pt is a 66 y/o female s/p R TKA. PMH includes breast cancer, OSA, a fib, and L THA.  Clinical Impression  Pt admitted secondary to problem above with deficits below. Pt initially very woozy, but after seated rest, able to ambulate to the bathroom and back with min to min guard A. Noted pt to have R foot drop throughout. Reviewed supine HEP with pt. Anticipate pt will progress well as symptoms improve. Will continue to follow acutely.      Recommendations for follow up therapy are one component of a multi-disciplinary discharge planning process, led by the attending physician.  Recommendations may be updated based on patient status, additional functional criteria and insurance authorization.  Follow Up Recommendations Follow physician's recommendations for discharge plan and follow up therapies    Assistance Recommended at Discharge Intermittent Supervision/Assistance  Patient can return home with the following  Help with stairs or ramp for entrance;Assist for transportation;Assistance with cooking/housework    Equipment Recommendations None recommended by PT  Recommendations for Other Services       Functional Status Assessment Patient has had a recent decline in their functional status and demonstrates the ability to make significant improvements in function in a reasonable and predictable amount of time.     Precautions / Restrictions Precautions Precautions: Knee Precaution Booklet Issued: No Precaution Comments: Verbally reviewed knee precautions. Required Braces or Orthoses: Knee Immobilizer - Right Restrictions Weight Bearing Restrictions: Yes RLE Weight Bearing: Weight bearing as tolerated      Mobility  Bed Mobility Overal bed mobility: Needs Assistance Bed Mobility: Supine to Sit     Supine to sit: Min assist      General bed mobility comments: Min A for steadying assist.    Transfers Overall transfer level: Needs assistance Equipment used: Rolling walker (2 wheels) Transfers: Sit to/from Stand, Bed to chair/wheelchair/BSC Sit to Stand: Min assist Stand pivot transfers: Min assist         General transfer comment: Min A for lift assist and steadying to stand. Pt very shaky and woozy and required seated rest in chair. following seated rest, pt able to ambulate to bathroom    Ambulation/Gait Ambulation/Gait assistance: Min guard Gait Distance (Feet): 20 Feet Assistive device: Rolling walker (2 wheels) Gait Pattern/deviations: Step-to pattern, Decreased step length - right, Decreased step length - left, Decreased dorsiflexion - right, Decreased weight shift to right Gait velocity: Decreased     General Gait Details: Min guard for safety to ambulate to bathroom and back. Cues for sequencing. Pt with RLE foot drop throughout mobility  Stairs            Wheelchair Mobility    Modified Rankin (Stroke Patients Only)       Balance Overall balance assessment: Needs assistance Sitting-balance support: No upper extremity supported, Feet supported Sitting balance-Leahy Scale: Good     Standing balance support: Bilateral upper extremity supported Standing balance-Leahy Scale: Poor Standing balance comment: reliant on BUE support                             Pertinent Vitals/Pain Pain Assessment Pain Assessment: Faces Faces Pain Scale: Hurts little more Pain Location: R knee Pain Descriptors / Indicators: Grimacing, Guarding Pain Intervention(s): Limited activity within patient's tolerance, Monitored during session, Repositioned    Home Living Family/patient expects to  be discharged to:: Private residence Living Arrangements: Spouse/significant other Available Help at Discharge: Family Type of Home: House Home Access: Stairs to enter Entrance Stairs-Rails:  Right Entrance Stairs-Number of Steps: 2   Home Layout: One level Home Equipment: Conservation officer, nature (2 wheels);BSC/3in1      Prior Function Prior Level of Function : Independent/Modified Independent                     Hand Dominance        Extremity/Trunk Assessment   Upper Extremity Assessment Upper Extremity Assessment: Defer to OT evaluation    Lower Extremity Assessment Lower Extremity Assessment: RLE deficits/detail RLE Deficits / Details: deficits consistent with post op pain and weakness. No AROM into DF.    Cervical / Trunk Assessment Cervical / Trunk Assessment: Normal  Communication   Communication: No difficulties  Cognition Arousal/Alertness: Awake/alert Behavior During Therapy: WFL for tasks assessed/performed Overall Cognitive Status: Within Functional Limits for tasks assessed                                          General Comments      Exercises Total Joint Exercises Ankle Circles/Pumps: PROM, Right, 10 reps Quad Sets: AROM, Right, 10 reps Heel Slides: AAROM, Right, 10 reps   Assessment/Plan    PT Assessment Patient needs continued PT services  PT Problem List Decreased strength;Decreased range of motion;Decreased activity tolerance;Decreased mobility;Decreased balance;Decreased knowledge of use of DME;Decreased knowledge of precautions;Pain       PT Treatment Interventions DME instruction;Gait training;Stair training;Functional mobility training;Therapeutic activities;Therapeutic exercise;Balance training;Patient/family education    PT Goals (Current goals can be found in the Care Plan section)  Acute Rehab PT Goals Patient Stated Goal: to go home PT Goal Formulation: With patient Time For Goal Achievement: 03/11/22 Potential to Achieve Goals: Good    Frequency 7X/week     Co-evaluation               AM-PAC PT "6 Clicks" Mobility  Outcome Measure Help needed turning from your back to your side while in  a flat bed without using bedrails?: None Help needed moving from lying on your back to sitting on the side of a flat bed without using bedrails?: A Little Help needed moving to and from a bed to a chair (including a wheelchair)?: A Little Help needed standing up from a chair using your arms (e.g., wheelchair or bedside chair)?: A Little Help needed to walk in hospital room?: A Little Help needed climbing 3-5 steps with a railing? : A Lot 6 Click Score: 18    End of Session Equipment Utilized During Treatment: Gait belt;Right knee immobilizer Activity Tolerance: Patient tolerated treatment well Patient left: in chair;with call bell/phone within reach;with family/visitor present Nurse Communication: Mobility status PT Visit Diagnosis: Other abnormalities of gait and mobility (R26.89);Pain;Difficulty in walking, not elsewhere classified (R26.2) Pain - Right/Left: Right Pain - part of body: Knee    Time: 4854-6270 PT Time Calculation (min) (ACUTE ONLY): 33 min   Charges:   PT Evaluation $PT Eval Low Complexity: 1 Low PT Treatments $Therapeutic Activity: 8-22 mins        Lou Miner, DPT  Acute Rehabilitation Services  Office: [phone]   Rudean Hitt 02/25/2022, 4:32 PM

## 2022-02-25 NOTE — Anesthesia Postprocedure Evaluation (Signed)
Anesthesia Post Note  Patient: ANJELINA DUNG  Procedure(s) Performed: RIGHT TOTAL KNEE ARTHROPLASTY (Right: Knee)     Patient location during evaluation: PACU Anesthesia Type: General and Regional Level of consciousness: awake and alert Pain management: pain level controlled Vital Signs Assessment: post-procedure vital signs reviewed and stable Respiratory status: spontaneous breathing, nonlabored ventilation and respiratory function stable Cardiovascular status: blood pressure returned to baseline and stable Postop Assessment: no apparent nausea or vomiting Anesthetic complications: no   No notable events documented.  Last Vitals:  Vitals:   02/25/22 1145 02/25/22 1200  BP: 126/73 126/66  Pulse: 75 69  Resp: 15 17  Temp:  36.5 C  SpO2: 95% 96%    Last Pain:  Vitals:   02/25/22 1130  TempSrc:   PainSc: Asleep                 Rumaisa Schnetzer,W. EDMOND

## 2022-02-25 NOTE — Anesthesia Procedure Notes (Signed)
Procedure Name: LMA Insertion Date/Time: 02/25/2022 8:35 AM  Performed by: Kyung Rudd, CRNAPre-anesthesia Checklist: Patient identified, Emergency Drugs available, Suction available and Patient being monitored Patient Re-evaluated:Patient Re-evaluated prior to induction Oxygen Delivery Method: Circle system utilized Preoxygenation: Pre-oxygenation with 100% oxygen Induction Type: IV induction LMA: LMA inserted LMA Size: 4.0 Number of attempts: 1 Placement Confirmation: positive ETCO2 and breath sounds checked- equal and bilateral Tube secured with: Tape Dental Injury: Teeth and Oropharynx as per pre-operative assessment

## 2022-02-26 ENCOUNTER — Encounter (HOSPITAL_COMMUNITY): Payer: Self-pay | Admitting: Orthopaedic Surgery

## 2022-02-26 ENCOUNTER — Other Ambulatory Visit: Payer: Self-pay | Admitting: Orthopaedic Surgery

## 2022-02-26 DIAGNOSIS — M1711 Unilateral primary osteoarthritis, right knee: Secondary | ICD-10-CM | POA: Diagnosis not present

## 2022-02-26 LAB — CBC
HCT: 32.6 % — ABNORMAL LOW (ref 36.0–46.0)
Hemoglobin: 10.7 g/dL — ABNORMAL LOW (ref 12.0–15.0)
MCH: 29.9 pg (ref 26.0–34.0)
MCHC: 32.8 g/dL (ref 30.0–36.0)
MCV: 91.1 fL (ref 80.0–100.0)
Platelets: 174 10*3/uL (ref 150–400)
RBC: 3.58 MIL/uL — ABNORMAL LOW (ref 3.87–5.11)
RDW: 13.6 % (ref 11.5–15.5)
WBC: 11.2 10*3/uL — ABNORMAL HIGH (ref 4.0–10.5)
nRBC: 0 % (ref 0.0–0.2)

## 2022-02-26 LAB — BASIC METABOLIC PANEL
Anion gap: 8 (ref 5–15)
BUN: 12 mg/dL (ref 8–23)
CO2: 26 mmol/L (ref 22–32)
Calcium: 9.1 mg/dL (ref 8.9–10.3)
Chloride: 104 mmol/L (ref 98–111)
Creatinine, Ser: 0.71 mg/dL (ref 0.44–1.00)
GFR, Estimated: >60 mL/min (ref >60)
Glucose, Bld: 147 mg/dL — ABNORMAL HIGH (ref 70–99)
Potassium: 4.8 mmol/L (ref 3.5–5.1)
Sodium: 138 mmol/L (ref 135–145)

## 2022-02-26 MED ORDER — METHOCARBAMOL 500 MG PO TABS: 500.0000 mg | ORAL_TABLET | Freq: Four times a day (QID) | ORAL | 1 refills | Status: DC | PRN

## 2022-02-26 MED ORDER — ONDANSETRON 4 MG PO TBDP: 4.0000 mg | ORAL_TABLET | Freq: Three times a day (TID) | ORAL | 0 refills | Status: DC | PRN

## 2022-02-26 MED ORDER — OXYCODONE HCL 5 MG PO TABS: 5.0000 mg | ORAL_TABLET | ORAL | 0 refills | Status: DC | PRN

## 2022-02-26 MED ORDER — ASPIRIN 81 MG PO CHEW: 81.0000 mg | CHEWABLE_TABLET | Freq: Two times a day (BID) | ORAL | 0 refills | Status: DC

## 2022-02-26 NOTE — Discharge Summary (Signed)
Patient ID: Linda Foster MRN: [id number] DOB/AGE: 1956-06-05 66 y.o.  Admit date: 02/25/2022 Discharge date: 02/26/2022  Admission Diagnoses:  Principal Problem:   Unilateral primary osteoarthritis, right knee Active Problems:   Status post right knee replacement   Discharge Diagnoses:  Same  Past Medical History:  Diagnosis Date   Breast cancer ([place]) 2008   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   Breast cancer ([place]) 2019   left ER/PR +,  XRT and chemotherapy   Dysrhythmia    Hx of A.Fib   Family history of adverse reaction to anesthesia    N&V   Family history of prostate cancer    Lymphedema 2013   OSA (obstructive sleep apnea)    uses a dental appliance   Osteoarthritis    Persistent atrial fibrillation ([place])    History of Afib   PONV (postoperative nausea and vomiting)     Surgeries: Procedure(s): RIGHT TOTAL KNEE ARTHROPLASTY on 02/25/2022   Consultants:   Discharged Condition: Improved  Hospital Course: Linda Foster is an 66 y.o. female who was admitted 02/25/2022 for operative treatment ofUnilateral primary osteoarthritis, right knee. Patient has severe unremitting pain that affects sleep, daily activities, and work/hobbies. After pre-op clearance the patient was taken to the operating room on 02/25/2022 and underwent  Procedure(s): RIGHT TOTAL KNEE ARTHROPLASTY.    Patient was given perioperative antibiotics:  Anti-infectives (From admission, onward)    Start     Dose/Rate Route Frequency Ordered Stop   02/25/22 1100  vancomycin (VANCOCIN) IVPB 1000 mg/200 mL premix        1,000 mg 200 mL/hr over 60 Minutes Intravenous Every 12 hours 02/25/22 1057 02/26/22 0747   02/25/22 0700  ceFAZolin (ANCEF) IVPB 2g/100 mL premix        2 g 200 mL/hr over 30 Minutes Intravenous On call to O.R. 02/25/22 0645 02/25/22 0850        Patient was given sequential compression devices, early ambulation, and chemoprophylaxis to prevent DVT.  Patient benefited maximally from  hospital stay and there were no complications.    Recent vital signs: Patient Vitals for the past 24 hrs:  BP Temp Temp src Pulse Resp SpO2  02/26/22 0754 (!) 107/53 98.6 F (37 C) Oral 80 16 100 %  02/26/22 0558 (!) 116/53 97.9 F (36.6 C) Oral 72 18 100 %  02/26/22 0015 105/61 98.6 F (37 C) Oral 72 20 96 %  02/25/22 2103 (!) 104/58 98.7 F (37.1 C) Oral 81 18 99 %  02/25/22 1741 128/63 98.8 F (37.1 C) -- 78 16 100 %     Recent laboratory studies:  Recent Labs    02/26/22 0459  WBC 11.2*  HGB 10.7*  HCT 32.6*  PLT 174  NA 138  K 4.8  CL 104  CO2 26  BUN 12  CREATININE 0.71  GLUCOSE 147*  CALCIUM 9.1     Discharge Medications:   Allergies as of 02/26/2022   No Known Allergies      Medication List     TAKE these medications    anastrozole 1 MG tablet Commonly known as: ARIMIDEX Take 1 tablet (1 mg total) by mouth daily.   APPLE CIDER VINEGAR PO Take 2-3 tablets by mouth daily.   aspirin 81 MG chewable tablet Chew 1 tablet (81 mg total) by mouth 2 (two) times daily.   b complex vitamins capsule Take 1 capsule by mouth daily.   cholecalciferol 25 MCG (1000 UNIT) tablet Commonly known as: VITAMIN  D3 Take 1,000 Units by mouth daily.   furosemide 40 MG tablet Commonly known as: LASIX Take 1 tablet (40 mg total) by mouth daily.   GLUCOSAMINE PO Take 2 tablets by mouth every morning.   methocarbamol 500 MG tablet Commonly known as: ROBAXIN Take 1 tablet (500 mg total) by mouth every 6 (six) hours as needed for muscle spasms.   metoprolol tartrate 50 MG tablet Commonly known as: LOPRESSOR Take 1 tablet (50 mg total) by mouth 2 (two) times daily.   Multiple Vitamin tablet Take 2 tablets by mouth daily.   naproxen 500 MG 24 hr tablet Commonly known as: NAPRELAN Take 500 mg by mouth daily with breakfast.   oxyCODONE 5 MG immediate release tablet Commonly known as: Oxy IR/ROXICODONE Take 1-2 tablets (5-10 mg total) by mouth every 4 (four)  hours as needed for moderate pain (pain score 4-6).               Durable Medical Equipment  (From admission, onward)           Start     Ordered   02/25/22 1315  DME 3 n 1  Once        02/25/22 1314   02/25/22 1315  DME Walker rolling  Once       Question Answer Comment  Walker: With 5 Inch Wheels   Patient needs a walker to treat with the following condition Status post total right knee replacement      02/25/22 1314            Diagnostic Studies: DG Knee Right Port  Result Date: 02/25/2022 CLINICAL DATA:  Status post right knee replacement EXAM: PORTABLE RIGHT KNEE - 2 VIEW COMPARISON:  Right knee radiograph dated Jan 16, 2020 FINDINGS: Interval postsurgical changes from right total knee arthroplasty. Arthroplasty components appear in their expected alignment. No periprosthetic fracture is identified. Expected postoperative changes within the overlying soft tissues. IMPRESSION: Postsurgical changes of right total knee arthroplasty. Electronically Signed   By: Yetta Glassman M.D.   On: 02/25/2022 11:26    Disposition: Discharge disposition: 01-Home or [place], [place] Follow up.   Specialty: Home Health Services Why: The home health agency will contact you for the first home visit. Contact information: [place] [address] [phone]         Mcarthur Rossetti, MD Follow up in 2 week(s).   Specialty: Orthopedic Surgery Contact information: [address] [place] [address] [phone]                  Signed: Mcarthur Rossetti 02/26/2022, 2:41 PM

## 2022-02-26 NOTE — Progress Notes (Signed)
Physical Therapy Treatment Patient Details Name: Linda Foster MRN: [id number] DOB: 09/04/1956 Today's Date: 02/26/2022   History of Present Illness Pt is a 66 y/o female s/p R TKA on 02/25/2022. PMH includes breast cancer, OSA, a fib, and L THA.    PT Comments    Pt progressing towards physical therapy goals. Toe lifter provided to aide in ankle DF until drop foot improves. She was able to demonstrate improved gait pattern however still unable to gain terminal knee extension and compensating with increased step length on the R. Pt and husband educated on HEP, car transfer, stair negotiation, and activity progression. Will continue to follow and progress as able per POC.    Recommendations for follow up therapy are one component of a multi-disciplinary discharge planning process, led by the attending physician.  Recommendations may be updated based on patient status, additional functional criteria and insurance authorization.  Follow Up Recommendations  Follow physician's recommendations for discharge plan and follow up therapies     Assistance Recommended at Discharge Intermittent Supervision/Assistance  Patient can return home with the following Help with stairs or ramp for entrance;Assist for transportation;Assistance with cooking/housework;A little help with walking and/or transfers   Equipment Recommendations  None recommended by PT    Recommendations for Other Services       Precautions / Restrictions Precautions Precautions: Knee Precaution Booklet Issued: No Precaution Comments: Educated pt that NO pillow/roll/ice pack should be placed under the knee, and LE should be resting in extension. Required Braces or Orthoses: Knee Immobilizer - Right Restrictions Weight Bearing Restrictions: Yes RLE Weight Bearing: Weight bearing as tolerated     Mobility  Bed Mobility               General bed mobility comments: Pt was received sitting up in recliner.     Transfers Overall transfer level: Needs assistance Equipment used: Rolling walker (2 wheels) Transfers: Sit to/from Stand Sit to Stand: Supervision, Min guard           General transfer comment: Close guard for safety as pt powered up to full stand. Good hand placement on seated surface for safety.    Ambulation/Gait Ambulation/Gait assistance: Min guard Gait Distance (Feet): 200 Feet Assistive device: Rolling walker (2 wheels) Gait Pattern/deviations: Step-to pattern, Decreased step length - right, Decreased step length - left, Decreased dorsiflexion - right, Decreased weight shift to right, Step-through pattern, Antalgic Gait velocity: Decreased Gait velocity interpretation: 1.31 - 2.62 ft/sec, indicative of limited community ambulator   General Gait Details: Toe lifter donned prior to ambulation and pt with improved gait pattern. No overt LOB noted, however still mildly antalgic.   Stairs  Stairs: Yes Stairs assistance: Min guard Stair Management: One rail Right, Step to pattern, Sideways Number of Stairs: 2 General stair comments: VC's for sequencing and general safety. Husband present for education and practiced guarding pt. Gait belt issued for increased safety as pt catching toes on under side of the step with advancement due to foot drop.   Wheelchair Mobility    Modified Rankin (Stroke Patients Only)       Balance Overall balance assessment: Needs assistance Sitting-balance support: No upper extremity supported, Feet supported Sitting balance-Leahy Scale: Good     Standing balance support: Bilateral upper extremity supported Standing balance-Leahy Scale: Fair Standing balance comment: reliant on BUE support  Cognition Arousal/Alertness: Awake/alert Behavior During Therapy: WFL for tasks assessed/performed Overall Cognitive Status: Within Functional Limits for tasks assessed                                           Exercises Total Joint Exercises Ankle Circles/Pumps: PROM, Right, 10 reps Quad Sets: 10 reps Towel Squeeze: 10 reps Short Arc Quad: 10 reps, AAROM Heel Slides: 10 reps, AAROM Hip ABduction/ADduction: 10 reps Straight Leg Raises: 10 reps, AAROM Long Arc Quad: 10 reps, AAROM Knee Flexion: 10 reps Goniometric ROM: 10-78 AROM in sitting R knee    General Comments        Pertinent Vitals/Pain Pain Assessment Pain Assessment: Faces Pain Score: 5  Faces Pain Scale: Hurts little more Pain Location: R knee Pain Descriptors / Indicators: Grimacing, Guarding, Operative site guarding Pain Intervention(s): Limited activity within patient's tolerance, Monitored during session, Repositioned    Home Living                          Prior Function            PT Goals (current goals can now be found in the care plan section) Acute Rehab PT Goals Patient Stated Goal: to go home PT Goal Formulation: With patient Time For Goal Achievement: 03/11/22 Potential to Achieve Goals: Good Progress towards PT goals: Progressing toward goals    Frequency    7X/week      PT Plan Current plan remains appropriate    Co-evaluation              AM-PAC PT "6 Clicks" Mobility   Outcome Measure  Help needed turning from your back to your side while in a flat bed without using bedrails?: None Help needed moving from lying on your back to sitting on the side of a flat bed without using bedrails?: A Little Help needed moving to and from a bed to a chair (including a wheelchair)?: A Little Help needed standing up from a chair using your arms (e.g., wheelchair or bedside chair)?: A Little Help needed to walk in hospital room?: A Little Help needed climbing 3-5 steps with a railing? : A Little 6 Click Score: 19    End of Session Equipment Utilized During Treatment: Gait belt Activity Tolerance: Patient tolerated treatment well Patient left: in chair;with  call bell/phone within reach;with family/visitor present Nurse Communication: Mobility status PT Visit Diagnosis: Other abnormalities of gait and mobility (R26.89);Pain;Difficulty in walking, not elsewhere classified (R26.2) Pain - Right/Left: Right Pain - part of body: Knee     Time: 8270-7867 PT Time Calculation (min) (ACUTE ONLY): 38 min  Charges:  $Gait Training: 23-37 mins $Therapeutic Exercise: 8-22 mins                     Rolinda Roan, PT, DPT Acute Rehabilitation Services Secure Chat Preferred Office: [phone]    Thelma Comp 02/26/2022, 4:02 PM

## 2022-02-26 NOTE — Evaluation (Signed)
Occupational Therapy Evaluation Patient Details Name: Linda Foster MRN: [id number] DOB: 1956-06-02 Today's Date: 02/26/2022   History of Present Illness Pt is a 66 y/o female s/p R TKA on 02/25/2022. PMH includes breast cancer, OSA, a fib, and L THA.   Clinical Impression   Patient admitted for the above procedure.  Pain post surgery is the only deficit for OT.  She is compensating well to maintain independence with ADL completion.  She will have assist as needed from her spouse.  No acute OT needs identified, and no post acute OT anticipated.        Recommendations for follow up therapy are one component of a multi-disciplinary discharge planning process, led by the attending physician.  Recommendations may be updated based on patient status, additional functional criteria and insurance authorization.   Follow Up Recommendations  No OT follow up    Assistance Recommended at Discharge Intermittent Supervision/Assistance  Patient can return home with the following      Functional Status Assessment  Patient has had a recent decline in their functional status and demonstrates the ability to make significant improvements in function in a reasonable and predictable amount of time.  Equipment Recommendations  None recommended by OT    Recommendations for Other Services       Precautions / Restrictions Precautions Precautions: Knee Precaution Booklet Issued: No Precaution Comments: Educated pt that NO pillow/roll/ice pack should be placed under the knee, and LE should be resting in extension. Required Braces or Orthoses: Knee Immobilizer - Right Restrictions Weight Bearing Restrictions: Yes RLE Weight Bearing: Weight bearing as tolerated      Mobility Bed Mobility                    Transfers Overall transfer level: Needs assistance Equipment used: Rolling walker (2 wheels) Transfers: Sit to/from Stand Sit to Stand: Supervision, Min guard                   Balance   Sitting-balance support: No upper extremity supported, Feet supported Sitting balance-Leahy Scale: Good     Standing balance support: Bilateral upper extremity supported Standing balance-Leahy Scale: Fair                             ADL either performed or assessed with clinical judgement   ADL                       Lower Body Dressing: Minimal assistance;Sit to/from stand                       Vision Baseline Vision/History: 1 Wears glasses Patient Visual Report: No change from baseline       Perception Perception Perception: Within Functional Limits   Praxis Praxis Praxis: Intact    Pertinent Vitals/Pain Pain Assessment Pain Assessment: Faces Faces Pain Scale: Hurts little more Pain Location: R knee Pain Descriptors / Indicators: Grimacing, Guarding, Operative site guarding Pain Intervention(s): Monitored during session     Hand Dominance Right   Extremity/Trunk Assessment Upper Extremity Assessment Upper Extremity Assessment: Overall WFL for tasks assessed   Lower Extremity Assessment Lower Extremity Assessment: Defer to PT evaluation   Cervical / Trunk Assessment Cervical / Trunk Assessment: Normal   Communication Communication Communication: No difficulties   Cognition Arousal/Alertness: Awake/alert Behavior During Therapy: WFL for tasks assessed/performed Overall Cognitive Status: Within Functional Limits for tasks assessed  General Comments   VSS on RA    Exercises     Shoulder Instructions      Home Living Family/patient expects to be discharged to:: Private residence Living Arrangements: Spouse/significant other Available Help at Discharge: Family Type of Home: House Home Access: Stairs to enter Technical brewer of Steps: 2 Entrance Stairs-Rails: Right Home Layout: One level     Bathroom Shower/Tub: Occupational psychologist:  Handicapped height     Home Equipment: Conservation officer, nature (2 wheels);BSC/3in1          Prior Functioning/Environment Prior Level of Function : Independent/Modified Independent                        OT Problem List: Pain      OT Treatment/Interventions:      OT Goals(Current goals can be found in the care plan section) Acute Rehab OT Goals Patient Stated Goal: Return home today OT Goal Formulation: With patient Time For Goal Achievement: 03/01/22 Potential to Achieve Goals: Good  OT Frequency:      Co-evaluation              AM-PAC OT "6 Clicks" Daily Activity     Outcome Measure Help from another person eating meals?: None Help from another person taking care of personal grooming?: None Help from another person toileting, which includes using toliet, bedpan, or urinal?: None Help from another person bathing (including washing, rinsing, drying)?: A Little Help from another person to put on and taking off regular upper body clothing?: None Help from another person to put on and taking off regular lower body clothing?: A Little 6 Click Score: 22   End of Session Equipment Utilized During Treatment: Rolling walker (2 wheels) Nurse Communication: Patient requests pain meds  Activity Tolerance: Patient tolerated treatment well Patient left: in chair;with call bell/phone within reach  OT Visit Diagnosis: Pain Pain - Right/Left: Right Pain - part of body: Knee                Time: 1245-8099 OT Time Calculation (min): 15 min Charges:  OT General Charges $OT Visit: 1 Visit OT Evaluation $OT Eval Moderate Complexity: 1 Mod  02/26/2022  RP, OTR/L  Acute Rehabilitation Services  Office:  [phone]   Metta Clines 02/26/2022, 10:04 AM

## 2022-02-26 NOTE — TOC Transition Note (Signed)
Transition of Care [facility]) - CM/SW Discharge Note   Patient Details  Name: Linda Foster MRN: [id number] Date of Birth: 1955/10/29  Transition of Care [facility]) CM/SW Contact:  Pollie Friar, RN Phone Number: 02/26/2022, 9:43 AM   Clinical Narrative:    Patient is discharging home with home health services pre-arranged through [facility] Information on the AVS. Any needed DME the bedside RN will arrange.  Pt has transport home.   Final next level of care: [place] Barriers to Discharge: No Barriers Identified   Patient Goals and CMS Choice        Discharge Placement                       Discharge Plan and Services                          HH Arranged: PT [place] Agency: [place]        Social Determinants of Health (SDOH) Interventions     Readmission Risk Interventions     No data to display

## 2022-02-26 NOTE — Progress Notes (Signed)
Subjective: 1 Day Post-Op Procedure(s) (LRB): RIGHT TOTAL KNEE ARTHROPLASTY (Right) Patient reports pain as moderate.    Objective: Vital signs in last 24 hours: Temp:  [97.7 F (36.5 C)-98.8 F (37.1 C)] 97.9 F (36.6 C) (06/16 0558) Pulse Rate:  [67-81] 72 (06/16 0558) Resp:  [12-20] 18 (06/16 0558) BP: (104-138)/(53-102) 116/53 (06/16 0558) SpO2:  [93 %-100 %] 100 % (06/16 0558) Weight:  [97.1 kg] 97.1 kg (06/15 0654)  Intake/Output from previous day: 06/15 0701 - 06/16 0700 In: 940 [P.O.:240; I.V.:700] Out: 100 [Blood:100] Intake/Output this shift: No intake/output data recorded.  Recent Labs    02/26/22 0459  HGB 10.7*   Recent Labs    02/26/22 0459  WBC 11.2*  RBC 3.58*  HCT 32.6*  PLT 174   Recent Labs    02/26/22 0459  NA 138  K 4.8  CL 104  CO2 26  BUN 12  CREATININE 0.71  GLUCOSE 147*  CALCIUM 9.1   No results for input(s): "LABPT", "INR" in the last 72 hours.  Sensation intact distally Intact pulses distally Dorsiflexion/Plantar flexion intact Incision: dressing C/D/I No cellulitis present Compartment soft   Assessment/Plan: 1 Day Post-Op Procedure(s) (LRB): RIGHT TOTAL KNEE ARTHROPLASTY (Right) Up with therapy Discharge home with home health      Mcarthur Rossetti 02/26/2022, 6:50 AM

## 2022-02-26 NOTE — Progress Notes (Signed)
Physical Therapy Treatment Patient Details Name: Linda Foster MRN: [id number] DOB: 1956-05-20 Today's Date: 02/26/2022   History of Present Illness Pt is a 66 y/o female s/p R TKA on 02/25/2022. PMH includes breast cancer, OSA, a fib, and L THA.    PT Comments    Pt progressing towards physical therapy goals. Continues to be limited by R foot drop. May benefit from a temporary toe lifter until active range begins to return. Will plan to see for a second session to initiate stair training, advance gait training, and fit toe lifter.     Recommendations for follow up therapy are one component of a multi-disciplinary discharge planning process, led by the attending physician.  Recommendations may be updated based on patient status, additional functional criteria and insurance authorization.  Follow Up Recommendations  Follow physician's recommendations for discharge plan and follow up therapies     Assistance Recommended at Discharge Intermittent Supervision/Assistance  Patient can return home with the following Help with stairs or ramp for entrance;Assist for transportation;Assistance with cooking/housework;A little help with walking and/or transfers   Equipment Recommendations  None recommended by PT    Recommendations for Other Services       Precautions / Restrictions Precautions Precautions: Knee Precaution Booklet Issued: No Precaution Comments: Educated pt that NO pillow/roll/ice pack should be placed under the knee, and LE should be resting in extension. Required Braces or Orthoses: Knee Immobilizer - Right Restrictions Weight Bearing Restrictions: Yes RLE Weight Bearing: Weight bearing as tolerated     Mobility  Bed Mobility               General bed mobility comments: Pt was received sitting up in recliner.    Transfers Overall transfer level: Needs assistance Equipment used: Rolling walker (2 wheels) Transfers: Sit to/from Stand Sit to Stand: Min guard            General transfer comment: Close guard for safety as pt powered up to full stand.    Ambulation/Gait Ambulation/Gait assistance: Min guard Gait Distance (Feet): 200 Feet Assistive device: Rolling walker (2 wheels) Gait Pattern/deviations: Step-to pattern, Decreased step length - right, Decreased step length - left, Decreased dorsiflexion - right, Decreased weight shift to right, Step-through pattern, Antalgic Gait velocity: Decreased Gait velocity interpretation: 1.31 - 2.62 ft/sec, indicative of limited community ambulator   General Gait Details: Antalgic due to R foot drop and hip hike to advance RLE. Pt was able to progress to step-through gait pattern with cues. Good return with education for improved posture and closer walker proximity.   Stairs             Wheelchair Mobility    Modified Rankin (Stroke Patients Only)       Balance Overall balance assessment: Needs assistance Sitting-balance support: No upper extremity supported, Feet supported Sitting balance-Leahy Scale: Good     Standing balance support: Bilateral upper extremity supported Standing balance-Leahy Scale: Poor Standing balance comment: reliant on BUE support                            Cognition Arousal/Alertness: Awake/alert Behavior During Therapy: WFL for tasks assessed/performed Overall Cognitive Status: Within Functional Limits for tasks assessed                                          Exercises Total Joint  Exercises Ankle Circles/Pumps: PROM, Right, 10 reps Quad Sets: 10 reps Towel Squeeze: 10 reps Short Arc Quad: 10 reps, AAROM Heel Slides: 10 reps, AAROM Hip ABduction/ADduction: 10 reps Straight Leg Raises: 10 reps, AAROM Long Arc Quad: 10 reps, AAROM Knee Flexion: 10 reps    General Comments        Pertinent Vitals/Pain Pain Assessment Pain Assessment: 0-10 Pain Score: 5  Pain Location: R knee Pain Descriptors / Indicators:  Grimacing, Guarding, Operative site guarding Pain Intervention(s): Limited activity within patient's tolerance, Monitored during session, Repositioned    Home Living                          Prior Function            PT Goals (current goals can now be found in the care plan section) Acute Rehab PT Goals Patient Stated Goal: to go home PT Goal Formulation: With patient Time For Goal Achievement: 03/11/22 Potential to Achieve Goals: Good Progress towards PT goals: Progressing toward goals    Frequency    7X/week      PT Plan Current plan remains appropriate    Co-evaluation              AM-PAC PT "6 Clicks" Mobility   Outcome Measure  Help needed turning from your back to your side while in a flat bed without using bedrails?: None Help needed moving from lying on your back to sitting on the side of a flat bed without using bedrails?: A Little Help needed moving to and from a bed to a chair (including a wheelchair)?: A Little Help needed standing up from a chair using your arms (e.g., wheelchair or bedside chair)?: A Little Help needed to walk in hospital room?: A Little Help needed climbing 3-5 steps with a railing? : A Little 6 Click Score: 19    End of Session Equipment Utilized During Treatment: Gait belt Activity Tolerance: Patient tolerated treatment well Patient left: in chair;with call bell/phone within reach;with family/visitor present Nurse Communication: Mobility status PT Visit Diagnosis: Other abnormalities of gait and mobility (R26.89);Pain;Difficulty in walking, not elsewhere classified (R26.2) Pain - Right/Left: Right Pain - part of body: Knee     Time: 3664-4034 PT Time Calculation (min) (ACUTE ONLY): 41 min  Charges:  $Gait Training: 8-22 mins $Therapeutic Exercise: 23-37 mins                     Rolinda Roan, PT, DPT Acute Rehabilitation Services Secure Chat Preferred Office: [phone]    Thelma Comp 02/26/2022,  9:41 AM

## 2022-02-26 NOTE — Progress Notes (Signed)
Patient alert and oriented, mae's well, voiding adequate amount of urine, swallowing without difficulty, no c/o pain at time of discharge. Patient discharged home with family. Script and discharged instructions given to patient. Patient and family stated understanding of instructions given. Patient has an appointment with Dr. Blackman   

## 2022-03-03 ENCOUNTER — Telehealth: Payer: Self-pay | Admitting: Orthopaedic Surgery

## 2022-03-03 ENCOUNTER — Other Ambulatory Visit: Payer: Self-pay | Admitting: Orthopaedic Surgery

## 2022-03-03 MED ORDER — OXYCODONE HCL 5 MG PO TABS: 5.0000 mg | ORAL_TABLET | ORAL | 0 refills | Status: DC | PRN

## 2022-03-03 NOTE — Telephone Encounter (Signed)
Pt called and would like a refill on her pain medicine.   Cb [phone]

## 2022-03-03 NOTE — Telephone Encounter (Signed)
Please advise 

## 2022-03-05 ENCOUNTER — Other Ambulatory Visit: Payer: Self-pay | Admitting: Orthopaedic Surgery

## 2022-03-11 ENCOUNTER — Ambulatory Visit (INDEPENDENT_AMBULATORY_CARE_PROVIDER_SITE_OTHER): Payer: Medicare Other | Admitting: Orthopaedic Surgery

## 2022-03-11 ENCOUNTER — Encounter: Payer: Self-pay | Admitting: Orthopaedic Surgery

## 2022-03-11 DIAGNOSIS — Z96651 Presence of right artificial knee joint: Secondary | ICD-10-CM

## 2022-03-11 NOTE — Progress Notes (Signed)
The patient is 2 weeks tomorrow status post a right total knee arthroplasty.  She is pushing herself through therapy and doing a lot on her own.  The physical therapist is helped with home therapy as well.  She reports that she has been able to flex her knee to 95 degrees.  On today's visit she has almost full extension and her flexion is past 90 degrees.  I am very proud of what she has done pushing through the pain and getting this moving.  The staples have been removed and Steri-Strips applied.  She does have some weakness in her foot but not true foot drop but there is definitely weakness.  This should resolve based on what I am seeing right now.  It is essential we get her an outpatient physical therapy at this standpoint to get her knee moving even better.  I would like to see her back in 4 weeks to see how she is doing overall but no x-rays are needed.

## 2022-03-12 ENCOUNTER — Other Ambulatory Visit: Payer: Self-pay

## 2022-03-12 DIAGNOSIS — Z96651 Presence of right artificial knee joint: Secondary | ICD-10-CM

## 2022-03-17 ENCOUNTER — Encounter: Payer: Self-pay | Admitting: Physical Therapy

## 2022-03-17 ENCOUNTER — Ambulatory Visit (INDEPENDENT_AMBULATORY_CARE_PROVIDER_SITE_OTHER): Payer: Medicare Other | Admitting: Physical Therapy

## 2022-03-17 ENCOUNTER — Other Ambulatory Visit: Payer: Self-pay

## 2022-03-17 ENCOUNTER — Other Ambulatory Visit: Payer: Self-pay | Admitting: Orthopaedic Surgery

## 2022-03-17 DIAGNOSIS — R29818 Other symptoms and signs involving the nervous system: Secondary | ICD-10-CM

## 2022-03-17 DIAGNOSIS — M25661 Stiffness of right knee, not elsewhere classified: Secondary | ICD-10-CM | POA: Diagnosis not present

## 2022-03-17 DIAGNOSIS — R600 Localized edema: Secondary | ICD-10-CM

## 2022-03-17 DIAGNOSIS — R2689 Other abnormalities of gait and mobility: Secondary | ICD-10-CM | POA: Diagnosis not present

## 2022-03-17 DIAGNOSIS — M25561 Pain in right knee: Secondary | ICD-10-CM | POA: Diagnosis not present

## 2022-03-17 DIAGNOSIS — M21371 Foot drop, right foot: Secondary | ICD-10-CM

## 2022-03-17 DIAGNOSIS — M6281 Muscle weakness (generalized): Secondary | ICD-10-CM

## 2022-03-17 MED ORDER — OXYCODONE HCL 5 MG PO TABS: 5.0000 mg | ORAL_TABLET | ORAL | 0 refills | Status: DC | PRN

## 2022-03-17 NOTE — Therapy (Signed)
OUTPATIENT PHYSICAL THERAPY LOWER EXTREMITY EVALUATION   Patient Name: Linda Foster MRN: [id number] DOB:07/18/1956, 66 y.o., female Today's Date: 03/17/2022   PT End of Session - 03/17/22 1007     Visit Number 1    Number of Visits 16    Date for PT Re-Evaluation 05/12/22    Authorization Type Medicare and BCBS    Progress Note Due on Visit 10    PT Start Time 0927    PT Stop Time 1001    PT Time Calculation (min) 34 min    Activity Tolerance Patient tolerated treatment well    Behavior During Therapy [facility] for tasks assessed/performed             Past Medical History:  Diagnosis Date   Breast cancer ([place]) 2008   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   Breast cancer ([place]) 2019   left ER/PR +,  XRT and chemotherapy   Dysrhythmia    Hx of A.Fib   Family history of adverse reaction to anesthesia    N&V   Family history of prostate cancer    Lymphedema 2013   OSA (obstructive sleep apnea)    uses a dental appliance   Osteoarthritis    Persistent atrial fibrillation ([facility])    History of Afib   PONV (postoperative nausea and vomiting)    Past Surgical History:  Procedure Laterality Date   ABDOMINAL HYSTERECTOMY     ATRIAL FIBRILLATION ABLATION N/A 03/03/2021   Procedure: ATRIAL FIBRILLATION ABLATION;  Surgeon: Thompson Grayer, MD;  Location: [place] [facility];  Service: Cardiovascular;  Laterality: N/A;   BREAST LUMPECTOMY Left 2008   BREAST LUMPECTOMY WITH RADIOACTIVE SEED LOCALIZATION Left 12/05/2017   Procedure: BREAST LUMPECTOMY WITH RADIOACTIVE SEED LOCALIZATION;  Surgeon: Rolm Bookbinder, MD;  Location: [place];  Service: General;  Laterality: Left;   BREAST RECONSTRUCTION Left 2019   no BP on Lt arm   CARDIOVERSION N/A 12/01/2020   Procedure: CARDIOVERSION;  Surgeon: Larey Dresser, MD;  Location: [facility] ENDOS[facility];  Service: Cardiovascular;  Laterality: N/A;   CESAREAN SECTION      x 3   KNEE ARTHROSCOPY Right 2007   TEE WITHOUT CARDIOVERSION N/A  12/01/2020   Procedure: TRANSESOPHAGEAL ECHOCARDIOGRAM (TEE);  Surgeon: Larey Dresser, MD;  Location: [facility] ENDOSCOPY;  Service: Cardiovascular;  Laterality: N/A;   TOTAL HIP ARTHROPLASTY Left 06/15/2019   Procedure: LEFT TOTAL HIP ARTHROPLASTY ANTERIOR APPROACH;  Surgeon: Mcarthur Rossetti, MD;  Location: [facility];  Service: Orthopedics;  Laterality: Left;   TOTAL KNEE ARTHROPLASTY Right 02/25/2022   Procedure: RIGHT TOTAL KNEE ARTHROPLASTY;  Surgeon: Mcarthur Rossetti, MD;  Location: [place];  Service: Orthopedics;  Laterality: Right;   Patient Active Problem List   Diagnosis Date Noted   Unilateral primary osteoarthritis, right knee 02/25/2022   Status post right knee replacement 02/25/2022   Paroxysmal atrial fibrillation ([place]) 11/25/2020   Unilateral primary osteoarthritis, left hip 06/15/2019   Status post total replacement of left hip 06/15/2019   OSA (obstructive sleep apnea) 02/13/2018   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 01/03/2018   Family history of prostate cancer 01/02/2018   Genetic testing 12/15/2017   Primary malignant neoplasm of lower outer quadrant of female breast, left ([place]) 12/14/2017   History of therapeutic radiation 11/01/2017   Ductal carcinoma in situ (DCIS) of left breast 10/24/2017   Chest pain with moderate risk of acute coronary syndrome 11/03/2016   Atrial fibrillation with rapid ventricular response ([place])  11/02/2016   History of invasive breast cancer 11/06/2012    PCP: Carol Ada, MD  REFERRING PROVIDER: Mcarthur Rossetti, MD   REFERRING DIAG: [phone] (ICD-10-CM) - Status post right knee replacement   THERAPY DIAG:  Acute pain of right knee - Plan: PT plan of care cert/re-cert  Stiffness of right knee, not elsewhere classified - Plan: PT plan of care cert/re-cert  Localized edema - Plan: PT plan of care cert/re-cert  Other abnormalities of gait and mobility - Plan: PT plan of care cert/re-cert  Muscle  weakness (generalized) - Plan: PT plan of care cert/re-cert  Other symptoms and signs involving the nervous system - Plan: PT plan of care cert/re-cert  Foot drop, right - Plan: PT plan of care cert/re-cert  Rationale for Evaluation and Treatment Rehabilitation  ONSET DATE: 02/25/22  SUBJECTIVE:   SUBJECTIVE STATEMENT: Pt is s/p Rt TKA on 02/25/22.  She does have some foot drop and c/o difficulty with toe extension as well as numbness on top of the foot.  She does report when working on swelling and retrograde massage the foot is very sensitive.   PERTINENT HISTORY: breast cancer, OSA, a fib, and L THA.   PAIN:  Are you having pain? Yes: NPRS scale: 3, up to 3/10 Pain location: Rt knee Pain description: soreness, aching Aggravating factors: bending knee, walking, prolonged positioning Relieving factors: medication  PRECAUTIONS: None  WEIGHT BEARING RESTRICTIONS No  FALLS:  Has patient fallen in last 6 months? No  LIVING ENVIRONMENT: Lives with: lives with their spouse Lives in: House/apartment Stairs: Yes: External: 2 steps; on right going up Has following equipment at home: Single point cane, Walker - 2 wheeled, shower chair, and bed side commode  OCCUPATION: retired  PLOF: Independent and Leisure: spend time with grandchildren, scrap booking, limited exercise, has a pool at home as well  PATIENT GOALS normal walking, regain ROM, improve foot/toe weakness   OBJECTIVE:   PATIENT SURVEYS:  03/17/22: FOTO 65 (predicted 70)  COGNITION: Overall cognitive status: Within functional limits for tasks assessed     SENSATION: Light touch: Impaired  and very sensitive at top of foot  EDEMA:  03/17/22: Circumferential: knee joint line: Rt: 52.5 cm; Lt: 51 cm  LOWER EXTREMITY ROM:  Active ROM Right eval Left eval  Knee flexion 85   Knee extension -25 (seated LAQ)     (Blank rows = not tested)  LOWER EXTREMITY ROM:  Passive ROM Right eval Left eval  Knee flexion  98   Knee extension -8   Ankle dorsiflexion     (Blank rows = not tested)  LOWER EXTREMITY MMT:  MMT Right eval Left eval  Hip flexion    Hip extension    Hip abduction    Hip adduction    Hip internal rotation    Hip external rotation    Knee flexion 3-/5   Knee extension 3-/5   Ankle dorsiflexion 3-/5   Ankle plantarflexion    Ankle inversion    Ankle eversion     (Blank rows = not tested)  03/17/22: No active toe extension noted  GAIT: Distance walked: 100 Assistive device utilized: Single point cane Level of assistance: Modified independence Comments: decreased stance on Rt, antalgic gait, decreased hip/knee flexion on Rt    TODAY'S TREATMENT: 03/17/22 Therex: See HEP - performed trial reps PRN for comprehension  Self Care: Discussed desensitization techniques as well as edema reduction techniques   PATIENT EDUCATION:  Education details: HEP Person educated: Patient  Education method: Explanation, Demonstration, and Handouts Education comprehension: verbalized understanding, returned demonstration, and needs further education   HOME EXERCISE PROGRAM: Access Code: [email or web address] URL: [email or web address] Date: 03/17/2022 Prepared by: Faustino Congress  Exercises - Long Sitting Quad Set with Towel Roll Under Heel  - 5-10 x daily - 7 x weekly - 1 sets - 5-10 reps - 5 sec hold - Seated Knee Extension AROM  - 5-10 x daily - 7 x weekly - 1 sets - 5-10 reps - 5 sec hold - Seated Knee Flexion AAROM  - 5-10 x daily - 7 x weekly - 1 sets - 5-10 reps - 10 sec hold - Supine Heel Slide with Strap  - 3-5 x daily - 7 x weekly - 1 sets - 10 reps - Seated Ankle Alphabet  - 2 x daily - 7 x weekly - 1 sets - 1 a-z - Seated Ankle Pumps on Table  - 2 x daily - 7 x weekly - 1 sets - 10 reps  ASSESSMENT:  CLINICAL IMPRESSION: Patient is a 66 y.o. female who was seen today for physical therapy evaluation and treatment for Rt TKA on 03/08/93, complicated by Rt foot  drop and decreased toe extension.  She demonstrates decreased strength and ROM, expected post op pain, and increase swelling affecting gait and functional mobility.  She will benefit from PT to address deficits listed.    OBJECTIVE IMPAIRMENTS Abnormal gait, decreased activity tolerance, decreased balance, decreased mobility, decreased ROM, decreased strength, increased edema, increased fascial restrictions, impaired flexibility, and pain.   ACTIVITY LIMITATIONS lifting, bending, sitting, standing, squatting, sleeping, stairs, transfers, bed mobility, and locomotion level  PARTICIPATION LIMITATIONS: cleaning, laundry, driving, community activity, and yard work  PERSONAL FACTORS 3+ comorbidities: breast cancer, OSA, a fib, and L THA.  and complication of Rt foot drop  are also affecting patient's functional outcome.   REHAB POTENTIAL: Good  CLINICAL DECISION MAKING: Evolving/moderate complexity  EVALUATION COMPLEXITY: Moderate   GOALS: Goals reviewed with patient? Yes  SHORT TERM GOALS: Target date: 04/14/2022  Independent with initial HEP Goal status: INITIAL  2.  Rt knee AROM 0-10-100 for improved mobility and function. Goal status: INITIAL  LONG TERM GOALS: Target date: 05/12/2022  Independent with final HEP Goal status: INITIAL  2.  FOTO score improved to 70 Goal status: INITIAL  3.  Rt knee AROM improved to 0-110 for improved mobility and function Goal status: INIITAL  4.  Report pain < 2/10 with daily functional activity for improved function Goal status: INITIAL  5.  Amb with LRAD with minimal gait deviations for improved functional mobility Goal status: INITIAL  6.  Demonstrate 4/5 dorsiflexion strength for improved function and ability to return to driving Goal status: INITIAL    PLAN: PT FREQUENCY: 2x/week  PT DURATION: 8 weeks  PLANNED INTERVENTIONS: Therapeutic exercises, Therapeutic activity, Neuromuscular re-education, Balance training, Gait training,  Patient/Family education, Joint mobilization, Stair training, Dry Needling, Electrical stimulation, Cryotherapy, Moist heat, scar mobilization, Taping, Vasopneumatic device, Manual therapy, and Re-evaluation  PLAN FOR NEXT SESSION: review HEP and work on maximizing ROM,      Laureen Abrahams, PT, DPT 03/17/22 10:22 AM

## 2022-03-18 ENCOUNTER — Encounter: Payer: Medicare Other | Admitting: Orthopaedic Surgery

## 2022-03-18 ENCOUNTER — Other Ambulatory Visit: Payer: Self-pay | Admitting: Physician Assistant

## 2022-03-18 ENCOUNTER — Encounter: Payer: Medicare Other | Admitting: Rehabilitative and Restorative Service Providers"

## 2022-03-18 MED ORDER — OXYCODONE HCL 5 MG PO TABS: 5.0000 mg | ORAL_TABLET | Freq: Four times a day (QID) | ORAL | 0 refills | Status: DC | PRN

## 2022-03-19 ENCOUNTER — Ambulatory Visit (INDEPENDENT_AMBULATORY_CARE_PROVIDER_SITE_OTHER): Payer: Medicare Other | Admitting: Rehabilitative and Restorative Service Providers"

## 2022-03-19 ENCOUNTER — Encounter: Payer: Self-pay | Admitting: Rehabilitative and Restorative Service Providers"

## 2022-03-19 DIAGNOSIS — R29818 Other symptoms and signs involving the nervous system: Secondary | ICD-10-CM

## 2022-03-19 DIAGNOSIS — M6281 Muscle weakness (generalized): Secondary | ICD-10-CM

## 2022-03-19 DIAGNOSIS — M25561 Pain in right knee: Secondary | ICD-10-CM

## 2022-03-19 DIAGNOSIS — R2689 Other abnormalities of gait and mobility: Secondary | ICD-10-CM | POA: Diagnosis not present

## 2022-03-19 DIAGNOSIS — R600 Localized edema: Secondary | ICD-10-CM

## 2022-03-19 DIAGNOSIS — M21371 Foot drop, right foot: Secondary | ICD-10-CM

## 2022-03-19 DIAGNOSIS — M25661 Stiffness of right knee, not elsewhere classified: Secondary | ICD-10-CM | POA: Diagnosis not present

## 2022-03-19 NOTE — Therapy (Signed)
OUTPATIENT PHYSICAL THERAPY TREATMENT   Patient Name: Linda Foster MRN: [id number] DOB:01/30/1956, 66 y.o., female Today's Date: 03/19/2022   PT End of Session - 03/19/22 0850     Visit Number 2    Number of Visits 16    Date for PT Re-Evaluation 05/12/22    Authorization Type Medicare and BCBS    Progress Note Due on Visit 10    PT Start Time 0842    PT Stop Time 0932    PT Time Calculation (min) 50 min    Activity Tolerance Patient tolerated treatment well    Behavior During Therapy [facility] for tasks assessed/performed              Past Medical History:  Diagnosis Date   Breast cancer ([place]) 2008   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   Breast cancer ([place]) 2019   left ER/PR +,  XRT and chemotherapy   Dysrhythmia    Hx of A.Fib   Family history of adverse reaction to anesthesia    N&V   Family history of prostate cancer    Lymphedema 2013   OSA (obstructive sleep apnea)    uses a dental appliance   Osteoarthritis    Persistent atrial fibrillation (HCC)    History of Afib   PONV (postoperative nausea and vomiting)    Past Surgical History:  Procedure Laterality Date   ABDOMINAL HYSTERECTOMY     ATRIAL FIBRILLATION ABLATION N/A 03/03/2021   Procedure: ATRIAL FIBRILLATION ABLATION;  Surgeon: Thompson Grayer, MD;  Location: [place] [facility];  Service: Cardiovascular;  Laterality: N/A;   BREAST LUMPECTOMY Left 2008   BREAST LUMPECTOMY WITH RADIOACTIVE SEED LOCALIZATION Left 12/05/2017   Procedure: BREAST LUMPECTOMY WITH RADIOACTIVE SEED LOCALIZATION;  Surgeon: Rolm Bookbinder, MD;  Location: [place];  Service: General;  Laterality: Left;   BREAST RECONSTRUCTION Left 2019   no BP on Lt arm   CARDIOVERSION N/A 12/01/2020   Procedure: CARDIOVERSION;  Surgeon: Larey Dresser, MD;  Location: [facility] ENDOSCOPY;  Service: Cardiovascular;  Laterality: N/A;   CESAREAN SECTION      x 3   KNEE ARTHROSCOPY Right 2007   TEE WITHOUT CARDIOVERSION N/A 12/01/2020    Procedure: TRANSESOPHAGEAL ECHOCARDIOGRAM (TEE);  Surgeon: Larey Dresser, MD;  Location: [facility] ENDOSCOPY;  Service: Cardiovascular;  Laterality: N/A;   TOTAL HIP ARTHROPLASTY Left 06/15/2019   Procedure: LEFT TOTAL HIP ARTHROPLASTY ANTERIOR APPROACH;  Surgeon: Mcarthur Rossetti, MD;  Location: [facility];  Service: Orthopedics;  Laterality: Left;   TOTAL KNEE ARTHROPLASTY Right 02/25/2022   Procedure: RIGHT TOTAL KNEE ARTHROPLASTY;  Surgeon: Mcarthur Rossetti, MD;  Location: [place];  Service: Orthopedics;  Laterality: Right;   Patient Active Problem List   Diagnosis Date Noted   Unilateral primary osteoarthritis, right knee 02/25/2022   Status post right knee replacement 02/25/2022   Paroxysmal atrial fibrillation ([place]) 11/25/2020   Unilateral primary osteoarthritis, left hip 06/15/2019   Status post total replacement of left hip 06/15/2019   OSA (obstructive sleep apnea) 02/13/2018   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) 12/14/2017   History of therapeutic radiation 11/01/2017   Ductal carcinoma in situ (DCIS) of left breast 10/24/2017   Chest pain with moderate risk of acute coronary syndrome 11/03/2016   Atrial fibrillation with rapid ventricular response ([place]) 11/02/2016  History of invasive breast cancer 11/06/2012    PCP: Carol Ada, MD  REFERRING PROVIDER: Mcarthur Rossetti, MD   REFERRING DIAG: [phone] (ICD-10-CM) - Status post right knee replacement   THERAPY DIAG:  Acute pain of right knee  Stiffness of right knee, not elsewhere classified  Localized edema  Other abnormalities of gait and mobility  Muscle weakness (generalized)  Other symptoms and signs involving the nervous system  Foot drop, right  Rationale for Evaluation and Treatment  Rehabilitation  ONSET DATE: 02/25/22  SUBJECTIVE:   SUBJECTIVE STATEMENT: Pt indicated 2-3/10 soreness in knee upon arrival.  Pt indicated foot drop and numbness in foot on Rt is troublesome as well as swelling.   PERTINENT HISTORY: breast cancer, OSA, a fib, and Lt THA.   PAIN:  Are you having pain? Yes: NPRS scale: 2-3/10 Pain location: Rt knee Pain description: soreness, aching Aggravating factors: bending knee, walking, prolonged positioning Relieving factors: medication  PRECAUTIONS: None  WEIGHT BEARING RESTRICTIONS No  FALLS:  Has patient fallen in last 6 months? No  LIVING ENVIRONMENT: Lives with: lives with their spouse Lives in: House/apartment Stairs: Yes: External: 2 steps; on right going up Has following equipment at home: Single point cane, Walker - 2 wheeled, shower chair, and bed side commode  OCCUPATION: retired  PLOF: Independent and Leisure: spend time with grandchildren, scrap booking, limited exercise, has a pool at home as well  PATIENT GOALS normal walking, regain ROM, improve foot/toe weakness   OBJECTIVE:   PATIENT SURVEYS:  03/17/22: FOTO 65 (predicted 70)  COGNITION: Overall cognitive status: Within functional limits for tasks assessed     SENSATION: Light touch: Impaired  and very sensitive at top of foot  EDEMA:  03/17/22: Circumferential: knee joint line: Rt: 52.5 cm; Lt: 51 cm  LOWER EXTREMITY ROM:  Active ROM Right 03/17/2022 Left 03/17/2022 Right 03/19/2022  Knee flexion 85    Knee extension -25 (seated LAQ)   -14 in seated LAQ AROM   (Blank rows = not tested)  LOWER EXTREMITY ROM:  Passive ROM Right 03/17/2022 Left 03/17/2022  Knee flexion 98   Knee extension -8   Ankle dorsiflexion     (Blank rows = not tested)  LOWER EXTREMITY MMT:  MMT Right 03/17/2022 Left 03/17/2022  Hip flexion    Hip extension    Hip abduction    Hip adduction    Hip internal rotation    Hip external rotation    Knee flexion 3-/5   Knee  extension 3-/5   Ankle dorsiflexion 3-/5   Ankle plantarflexion    Ankle inversion    Ankle eversion     (Blank rows = not tested)  03/17/22: No active toe extension noted  GAIT: Distance walked: 100 Assistive device utilized: Single point cane Level of assistance: Modified independence Comments: decreased stance on Rt, antalgic gait, decreased hip/knee flexion on Rt    TODAY'S TREATMENT: 03/19/2022 Therex:    Nustep lvl 5 8 mins UE/LE for ROM  Gastroc stretch incline board bilateral 30 sec x 3  Leg press double leg 50 lbs x 15, single leg x15 25 lbs  Seated LAQ c pause in end ranges flexion/extension 2 x 10   Seated alternating isometric holds 5 sec each way x 12 in 70 deg knee flexion   Supine AAROM heel slide c strap Rt leg 5 sec hold x 10   Manual:  Seated Rt knee flexion/distraction/IR mobilization c movement, Progressive end range stretch overpressure  Vasopneumatic Rt knee  10 mins medium compression in elevation 34 degrees  03/17/22 Therex: See HEP - performed trial reps PRN for comprehension  Self Care: Discussed desensitization techniques as well as edema reduction techniques   PATIENT EDUCATION:  Education details: HEP Person educated: Patient Education method: Explanation, Demonstration, and Handouts Education comprehension: verbalized understanding, returned demonstration, and needs further education   HOME EXERCISE PROGRAM: Access Code: [email or web address] URL: [email or web address] Date: 03/17/2022 Prepared by: Faustino Congress  Exercises - Long Sitting Quad Set with Towel Roll Under Heel  - 5-10 x daily - 7 x weekly - 1 sets - 5-10 reps - 5 sec hold - Seated Knee Extension AROM  - 5-10 x daily - 7 x weekly - 1 sets - 5-10 reps - 5 sec hold - Seated Knee Flexion AAROM  - 5-10 x daily - 7 x weekly - 1 sets - 5-10 reps - 10 sec hold - Supine Heel Slide with Strap  - 3-5 x daily - 7 x weekly - 1 sets - 10 reps - Seated Ankle Alphabet  - 2 x daily -  7 x weekly - 1 sets - 1 a-z - Seated Ankle Pumps on Table  - 2 x daily - 7 x weekly - 1 sets - 10 reps  ASSESSMENT:  CLINICAL IMPRESSION: Pt continued to demonstrate restriction in Rt knee mobility, strength and movement coordination that impacts functional movement.   Swelling noted throughout RLE.  Active DF observed in NWB activity today.    OBJECTIVE IMPAIRMENTS Abnormal gait, decreased activity tolerance, decreased balance, decreased mobility, decreased ROM, decreased strength, increased edema, increased fascial restrictions, impaired flexibility, and pain.   ACTIVITY LIMITATIONS lifting, bending, sitting, standing, squatting, sleeping, stairs, transfers, bed mobility, and locomotion level  PARTICIPATION LIMITATIONS: cleaning, laundry, driving, community activity, and yard work  PERSONAL FACTORS 3+ comorbidities: breast cancer, OSA, a fib, and L THA.  and complication of Rt foot drop  are also affecting patient's functional outcome.   REHAB POTENTIAL: Good  CLINICAL DECISION MAKING: Evolving/moderate complexity  EVALUATION COMPLEXITY: Moderate   GOALS: Goals reviewed with patient? Yes  SHORT TERM GOALS: Target date: 04/14/2022  Independent with initial HEP Goal status:on going - assessed 03/19/2022  2.  Rt knee AROM 0-10-100 for improved mobility and function. Goal status: on going - assessed 03/19/2022  LONG TERM GOALS: Target date: 05/12/2022  Independent with final HEP Goal status: INITIAL  2.  FOTO score improved to 70 Goal status: INITIAL  3.  Rt knee AROM improved to 0-110 for improved mobility and function Goal status: INIITAL  4.  Report pain < 2/10 with daily functional activity for improved function Goal status: INITIAL  5.  Amb with LRAD with minimal gait deviations for improved functional mobility Goal status: INITIAL  6.  Demonstrate 4/5 dorsiflexion strength for improved function and ability to return to driving Goal status: INITIAL    PLAN: PT  FREQUENCY: 2x/week  PT DURATION: 8 weeks  PLANNED INTERVENTIONS: Therapeutic exercises, Therapeutic activity, Neuromuscular re-education, Balance training, Gait training, Patient/Family education, Joint mobilization, Stair training, Dry Needling, Electrical stimulation, Cryotherapy, Moist heat, scar mobilization, Taping, Vasopneumatic device, Manual therapy, and Re-evaluation  PLAN FOR NEXT SESSION: Manual for mobility, progressive strengthening, early static balance   Scot Jun, PT, DPT, OCS, ATC 03/19/22  9:24 AM

## 2022-03-25 ENCOUNTER — Ambulatory Visit (INDEPENDENT_AMBULATORY_CARE_PROVIDER_SITE_OTHER): Payer: Medicare Other | Admitting: Rehabilitative and Restorative Service Providers"

## 2022-03-25 ENCOUNTER — Encounter: Payer: Self-pay | Admitting: Rehabilitative and Restorative Service Providers"

## 2022-03-25 DIAGNOSIS — R29818 Other symptoms and signs involving the nervous system: Secondary | ICD-10-CM

## 2022-03-25 DIAGNOSIS — M25661 Stiffness of right knee, not elsewhere classified: Secondary | ICD-10-CM | POA: Diagnosis not present

## 2022-03-25 DIAGNOSIS — M25571 Pain in right ankle and joints of right foot: Secondary | ICD-10-CM

## 2022-03-25 DIAGNOSIS — R2689 Other abnormalities of gait and mobility: Secondary | ICD-10-CM | POA: Diagnosis not present

## 2022-03-25 DIAGNOSIS — G8929 Other chronic pain: Secondary | ICD-10-CM

## 2022-03-25 DIAGNOSIS — M21371 Foot drop, right foot: Secondary | ICD-10-CM

## 2022-03-25 DIAGNOSIS — M25562 Pain in left knee: Secondary | ICD-10-CM

## 2022-03-25 DIAGNOSIS — R600 Localized edema: Secondary | ICD-10-CM | POA: Diagnosis not present

## 2022-03-25 DIAGNOSIS — M6281 Muscle weakness (generalized): Secondary | ICD-10-CM

## 2022-03-25 DIAGNOSIS — R262 Difficulty in walking, not elsewhere classified: Secondary | ICD-10-CM

## 2022-03-25 DIAGNOSIS — M25561 Pain in right knee: Secondary | ICD-10-CM

## 2022-03-25 NOTE — Therapy (Signed)
OUTPATIENT PHYSICAL THERAPY TREATMENT   Patient Name: Linda Foster MRN: [id number] DOB:1956/01/02, 66 y.o., female Today's Date: 03/25/2022   PT End of Session - 03/25/22 1518     Visit Number 3    Number of Visits 16    Date for PT Re-Evaluation 05/12/22    Authorization Type Medicare and BCBS    Progress Note Due on Visit 10    PT Start Time 1514    PT Stop Time 1608    PT Time Calculation (min) 54 min    Activity Tolerance Patient tolerated treatment well    Behavior During Therapy [facility] for tasks assessed/performed               Past Medical History:  Diagnosis Date   Breast cancer ([place]) 2008   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   Breast cancer ([place]) 2019   left ER/PR +,  XRT and chemotherapy   Dysrhythmia    Hx of A.Fib   Family history of adverse reaction to anesthesia    N&V   Family history of prostate cancer    Lymphedema 2013   OSA (obstructive sleep apnea)    uses a dental appliance   Osteoarthritis    Persistent atrial fibrillation (HCC)    History of Afib   PONV (postoperative nausea and vomiting)    Past Surgical History:  Procedure Laterality Date   ABDOMINAL HYSTERECTOMY     ATRIAL FIBRILLATION ABLATION N/A 03/03/2021   Procedure: ATRIAL FIBRILLATION ABLATION;  Surgeon: Thompson Grayer, MD;  Location: [place] [facility];  Service: Cardiovascular;  Laterality: N/A;   BREAST LUMPECTOMY Left 2008   BREAST LUMPECTOMY WITH RADIOACTIVE SEED LOCALIZATION Left 12/05/2017   Procedure: BREAST LUMPECTOMY WITH RADIOACTIVE SEED LOCALIZATION;  Surgeon: Rolm Bookbinder, MD;  Location: [place];  Service: General;  Laterality: Left;   BREAST RECONSTRUCTION Left 2019   no BP on Lt arm   CARDIOVERSION N/A 12/01/2020   Procedure: CARDIOVERSION;  Surgeon: Larey Dresser, MD;  Location: [facility] ENDOSCOPY;  Service: Cardiovascular;  Laterality: N/A;   CESAREAN SECTION      x 3   KNEE ARTHROSCOPY Right 2007   TEE WITHOUT CARDIOVERSION N/A 12/01/2020    Procedure: TRANSESOPHAGEAL ECHOCARDIOGRAM (TEE);  Surgeon: Larey Dresser, MD;  Location: [facility] ENDOSCOPY;  Service: Cardiovascular;  Laterality: N/A;   TOTAL HIP ARTHROPLASTY Left 06/15/2019   Procedure: LEFT TOTAL HIP ARTHROPLASTY ANTERIOR APPROACH;  Surgeon: Mcarthur Rossetti, MD;  Location: [facility];  Service: Orthopedics;  Laterality: Left;   TOTAL KNEE ARTHROPLASTY Right 02/25/2022   Procedure: RIGHT TOTAL KNEE ARTHROPLASTY;  Surgeon: Mcarthur Rossetti, MD;  Location: [place];  Service: Orthopedics;  Laterality: Right;   Patient Active Problem List   Diagnosis Date Noted   Unilateral primary osteoarthritis, right knee 02/25/2022   Status post right knee replacement 02/25/2022   Paroxysmal atrial fibrillation ([place]) 11/25/2020   Unilateral primary osteoarthritis, left hip 06/15/2019   Status post total replacement of left hip 06/15/2019   OSA (obstructive sleep apnea) 02/13/2018   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 01/03/2018   Family history of prostate cancer 01/02/2018   Genetic testing 12/15/2017   Primary malignant neoplasm of lower outer quadrant of female breast, left ([place]) 12/14/2017   History of therapeutic radiation 11/01/2017   Ductal carcinoma in situ (DCIS) of left breast 10/24/2017   Chest pain with moderate risk of acute coronary syndrome 11/03/2016   Atrial fibrillation with rapid ventricular response ([place])  11/02/2016   History of invasive breast cancer 11/06/2012    PCP: Carol Ada, MD  REFERRING PROVIDER: Mcarthur Rossetti, MD   REFERRING DIAG: [phone] (ICD-10-CM) - Status post right knee replacement   THERAPY DIAG:  Acute pain of right knee  Stiffness of right knee, not elsewhere classified  Localized edema  Other abnormalities of gait and mobility  Muscle weakness (generalized)  Other symptoms and signs involving the nervous system  Foot drop, right  Difficulty in walking, not elsewhere classified  Pain  in right ankle and joints of right foot  Chronic pain of right knee  Chronic pain of left knee  Rationale for Evaluation and Treatment Rehabilitation  ONSET DATE: 02/25/22  SUBJECTIVE:   SUBJECTIVE STATEMENT: She got back from vacation last night, and used the cane for some of the visit then stopped using it after it slipped on a wet spot. She did not go into the water, and was advised not to with her incision.  PERTINENT HISTORY: breast cancer, OSA, a fib, and Lt THA.   PAIN:   Are you having pain? Yes: NPRS scale: 2-3/10 Pain location: Rt knee Pain description: soreness, aching Aggravating factors: bending knee, walking, prolonged positioning Relieving factors: medication  PRECAUTIONS: None  WEIGHT BEARING RESTRICTIONS No  FALLS:  Has patient fallen in last 6 months? No  LIVING ENVIRONMENT: Lives with: lives with their spouse Lives in: House/apartment Stairs: Yes: External: 2 steps; on right going up Has following equipment at home: Single point cane, Walker - 2 wheeled, shower chair, and bed side commode  OCCUPATION: retired  PLOF: Independent and Leisure: spend time with grandchildren, scrap booking, limited exercise, has a pool at home as well  PATIENT GOALS normal walking, regain ROM, improve foot/toe weakness   OBJECTIVE:   PATIENT SURVEYS:  03/17/22: FOTO 65 (predicted 70)  COGNITION: Overall cognitive status: Within functional limits for tasks assessed     SENSATION: Light touch: Impaired  and very sensitive at top of foot  EDEMA:  03/17/22: Circumferential: knee joint line: Rt: 52.5 cm; Lt: 51 cm  LOWER EXTREMITY ROM:  Active ROM Right 03/17/2022 Left 03/17/2022 Right 03/19/2022  Knee flexion 85    Knee extension -25 (seated LAQ)   -14 in seated LAQ AROM   (Blank rows = not tested)  LOWER EXTREMITY ROM:  Passive ROM Right 03/17/2022 Left 03/17/2022  Knee flexion 98   Knee extension -8   Ankle dorsiflexion     (Blank rows = not tested)  LOWER  EXTREMITY MMT:  MMT Right 03/17/2022 Left 03/17/2022  Hip flexion    Hip extension    Hip abduction    Hip adduction    Hip internal rotation    Hip external rotation    Knee flexion 3-/5   Knee extension 3-/5   Ankle dorsiflexion 3-/5   Ankle plantarflexion    Ankle inversion    Ankle eversion     (Blank rows = not tested)  03/17/22: No active toe extension noted  GAIT: Distance walked: 100 Assistive device utilized: Single point cane Level of assistance: Modified independence Comments: decreased stance on Rt, antalgic gait, decreased hip/knee flexion on Rt   TODAY'S TREATMENT: 03/25/2022 Therex:    Nustep lvl 5 8 mins BLE for ROM  Leg press double leg 50 lbs x 25, single leg 25 lbs x15    Neuro Re-ed:  Standing static balance at //bars eyes open:  -narrow base of support 1 x 30s  -modified tandem both ways  1 x 30s  -tandem stance RLE in back 3 x 10s, pt felt more unsteady  3 direction tapping (anterior, lateral, posterior) x 6 reps each LE  Manual:  Seated Rt knee flexion end range stretch with manual overpressure  Supine Rt knee extension heel propped end range stretch with manual overpressure  Vasopneumatic Rt knee 10 mins medium compression in elevation 34 degrees  03/19/2022 Therex:    Nustep lvl 5 8 mins UE/LE for ROM  Gastroc stretch incline board bilateral 30 sec x 3  Leg press double leg 50 lbs x 15, single leg x15 25 lbs  Seated LAQ c pause in end ranges flexion/extension 2 x 10   Seated alternating isometric holds 5 sec each way x 12 in 70 deg knee flexion   Supine AAROM heel slide c strap Rt leg 5 sec hold x 10   Manual:  Seated Rt knee flexion/distraction/IR mobilization c movement, Progressive end range stretch overpressure  Vasopneumatic Rt knee 10 mins medium compression in elevation 34 degrees  03/17/22 Therex: See HEP - performed trial reps PRN for comprehension  Self Care: Discussed desensitization techniques as well as edema reduction  techniques   PATIENT EDUCATION:  Education details: HEP Person educated: Patient Education method: Explanation, Demonstration, and Handouts Education comprehension: verbalized understanding, returned demonstration, and needs further education   HOME EXERCISE PROGRAM: Access Code: [email or web address] URL: [email or web address] Date: 03/17/2022 Prepared by: Faustino Congress  Exercises - Long Sitting Quad Set with Towel Roll Under Heel  - 5-10 x daily - 7 x weekly - 1 sets - 5-10 reps - 5 sec hold - Seated Knee Extension AROM  - 5-10 x daily - 7 x weekly - 1 sets - 5-10 reps - 5 sec hold - Seated Knee Flexion AAROM  - 5-10 x daily - 7 x weekly - 1 sets - 5-10 reps - 10 sec hold - Supine Heel Slide with Strap  - 3-5 x daily - 7 x weekly - 1 sets - 10 reps - Seated Ankle Alphabet  - 2 x daily - 7 x weekly - 1 sets - 1 a-z - Seated Ankle Pumps on Table  - 2 x daily - 7 x weekly - 1 sets - 10 reps  ASSESSMENT:  CLINICAL IMPRESSION: She continues to have RLE swelling, decreased knee strength, and pain that reduces knee mobility. Active and passive knee flexion appear to be improved. She has progressed to using no assistive device at home or in the community and her gait pattern is safe and stable, showing increased stiffness following temporary immobility.  OBJECTIVE IMPAIRMENTS Abnormal gait, decreased activity tolerance, decreased balance, decreased mobility, decreased ROM, decreased strength, increased edema, increased fascial restrictions, impaired flexibility, and pain.   ACTIVITY LIMITATIONS lifting, bending, sitting, standing, squatting, sleeping, stairs, transfers, bed mobility, and locomotion level  PARTICIPATION LIMITATIONS: cleaning, laundry, driving, community activity, and yard work  PERSONAL FACTORS 3+ comorbidities: breast cancer, OSA, a fib, and L THA.  and complication of Rt foot drop  are also affecting patient's functional outcome.   REHAB POTENTIAL:  Good  CLINICAL DECISION MAKING: Evolving/moderate complexity  EVALUATION COMPLEXITY: Moderate   GOALS: Goals reviewed with patient? Yes  SHORT TERM GOALS: Target date: 04/14/2022  Independent with initial HEP Goal status:on going - assessed 03/19/2022  2.  Rt knee AROM 0-10-100 for improved mobility and function. Goal status: on going - assessed 03/19/2022  LONG TERM GOALS: Target date: 05/12/2022  Independent with final HEP Goal status: INITIAL  2.  FOTO score improved to 70 Goal status: INITIAL  3.  Rt knee AROM improved to 0-110 for improved mobility and function Goal status: INIITAL  4.  Report pain < 2/10 with daily functional activity for improved function Goal status: INITIAL  5.  Amb with LRAD with minimal gait deviations for improved functional mobility Goal status: INITIAL  6.  Demonstrate 4/5 dorsiflexion strength for improved function and ability to return to driving Goal status: INITIAL   PLAN: PT FREQUENCY: 2x/week  PT DURATION: 8 weeks  PLANNED INTERVENTIONS: Therapeutic exercises, Therapeutic activity, Neuromuscular re-education, Balance training, Gait training, Patient/Family education, Joint mobilization, Stair training, Dry Needling, Electrical stimulation, Cryotherapy, Moist heat, scar mobilization, Taping, Vasopneumatic device, Manual therapy, and Re-evaluation  PLAN FOR NEXT SESSION: Reassess ROM measurements. Manual for mobility, progressive strengthening and add straight single leg raise, progress static and dynamic balance including single leg activities.   Jana Hakim, SPT 03/25/22  4:12 PM   This entire session of physical therapy was performed under the direct supervision of PT signing evaluation /treatment. PT reviewed note and agrees.  Scot Jun, PT, DPT, OCS, ATC 03/25/22  4:35 PM

## 2022-03-26 ENCOUNTER — Ambulatory Visit (INDEPENDENT_AMBULATORY_CARE_PROVIDER_SITE_OTHER): Payer: Medicare Other | Admitting: Rehabilitative and Restorative Service Providers"

## 2022-03-26 ENCOUNTER — Encounter: Payer: Self-pay | Admitting: Rehabilitative and Restorative Service Providers"

## 2022-03-26 DIAGNOSIS — M25561 Pain in right knee: Secondary | ICD-10-CM

## 2022-03-26 DIAGNOSIS — M6281 Muscle weakness (generalized): Secondary | ICD-10-CM

## 2022-03-26 DIAGNOSIS — R600 Localized edema: Secondary | ICD-10-CM | POA: Diagnosis not present

## 2022-03-26 DIAGNOSIS — R29818 Other symptoms and signs involving the nervous system: Secondary | ICD-10-CM

## 2022-03-26 DIAGNOSIS — M25661 Stiffness of right knee, not elsewhere classified: Secondary | ICD-10-CM | POA: Diagnosis not present

## 2022-03-26 DIAGNOSIS — M21371 Foot drop, right foot: Secondary | ICD-10-CM

## 2022-03-26 DIAGNOSIS — R2689 Other abnormalities of gait and mobility: Secondary | ICD-10-CM | POA: Diagnosis not present

## 2022-03-26 NOTE — Therapy (Signed)
OUTPATIENT PHYSICAL THERAPY TREATMENT   Patient Name: Linda Foster MRN: [id number] DOB:[birth year], 66 y.o., female Today's Date: 03/26/2022   PT End of Session - 03/26/22 0854     Visit Number 4    Number of Visits 16    Date for PT Re-Evaluation 05/12/22    Authorization Type Medicare and BCBS    Progress Note Due on Visit 10    PT Start Time 0845    PT Stop Time 0938    PT Time Calculation (min) 53 min    Activity Tolerance Patient tolerated treatment well    Behavior During Therapy [facility] for tasks assessed/performed                Past Medical History:  Diagnosis Date   Breast cancer ([place]) 2008   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   Breast cancer ([place]) 2019   left ER/PR +,  XRT and chemotherapy   Dysrhythmia    Hx of A.Fib   Family history of adverse reaction to anesthesia    N&V   Family history of prostate cancer    Lymphedema 2013   OSA (obstructive sleep apnea)    uses a dental appliance   Osteoarthritis    Persistent atrial fibrillation (HCC)    History of Afib   PONV (postoperative nausea and vomiting)    Past Surgical History:  Procedure Laterality Date   ABDOMINAL HYSTERECTOMY     ATRIAL FIBRILLATION ABLATION N/A 03/03/2021   Procedure: ATRIAL FIBRILLATION ABLATION;  Surgeon: Thompson Grayer, MD;  Location: [facility];  Service: Cardiovascular;  Laterality: N/A;   BREAST LUMPECTOMY Left 2008   BREAST LUMPECTOMY WITH RADIOACTIVE SEED LOCALIZATION Left 12/05/2017   Procedure: BREAST LUMPECTOMY WITH RADIOACTIVE SEED LOCALIZATION;  Surgeon: Rolm Bookbinder, MD;  Location: [place];  Service: General;  Laterality: Left;   BREAST RECONSTRUCTION Left 2019   no BP on Lt arm   CARDIOVERSION N/A 12/01/2020   Procedure: CARDIOVERSION;  Surgeon: Larey Dresser, MD;  Location: [facility];  Service: Cardiovascular;  Laterality: N/A;   CESAREAN SECTION      x 3   KNEE ARTHROSCOPY Right 2007   TEE WITHOUT CARDIOVERSION N/A 12/01/2020    Procedure: TRANSESOPHAGEAL ECHOCARDIOGRAM (TEE);  Surgeon: Larey Dresser, MD;  Location: [facility]COPY;  Service: Cardiovascular;  Laterality: N/A;   TOTAL HIP ARTHROPLASTY Left 06/15/2019   Procedure: LEFT TOTAL HIP ARTHROPLASTY ANTERIOR APPROACH;  Surgeon: Mcarthur Rossetti, MD;  Location: [facility];  Service: Orthopedics;  Laterality: Left;   TOTAL KNEE ARTHROPLASTY Right 02/25/2022   Procedure: RIGHT TOTAL KNEE ARTHROPLASTY;  Surgeon: Mcarthur Rossetti, MD;  Location: [place];  Service: Orthopedics;  Laterality: Right;   Patient Active Problem List   Diagnosis Date Noted   Unilateral primary osteoarthritis, right knee 02/25/2022   Status post right knee replacement 02/25/2022   Paroxysmal atrial fibrillation ([place]) 11/25/2020   Unilateral primary osteoarthritis, left hip 06/15/2019   Status post total replacement of left hip 06/15/2019   OSA (obstructive sleep apnea) 02/13/2018   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 01/03/2018   Family history of prostate cancer 01/02/2018   Genetic testing 12/15/2017   Primary malignant neoplasm of lower outer quadrant of female breast, left ([place]) 12/14/2017   History of therapeutic radiation 11/01/2017   Ductal carcinoma in situ (DCIS) of left breast 10/24/2017   Chest pain with moderate risk of acute coronary syndrome 11/03/2016   Atrial fibrillation with rapid ventricular response (  [place]) 11/02/2016   History of invasive breast cancer 11/06/2012    PCP: Carol Ada, MD  REFERRING PROVIDER: Mcarthur Rossetti, MD   REFERRING DIAG: [phone] (ICD-10-CM) - Status post right knee replacement   THERAPY DIAG:  Acute pain of right knee  Stiffness of right knee, not elsewhere classified  Localized edema  Other abnormalities of gait and mobility  Muscle weakness (generalized)  Other symptoms and signs involving the nervous system  Foot drop, right  Rationale for Evaluation and Treatment  Rehabilitation  ONSET DATE: 02/25/22  SUBJECTIVE:   SUBJECTIVE STATEMENT: She felt well after the last PT session, but woke up to higher pain this morning in both knees. The right knee felt a 4-5/10 pain with tightness in medial and lateral knee. She took ibuprofen and used Voltaren gel prior to coming to PT and feels slightly better following the use of those. She has received clearance from her physician to begin driving next week.  PERTINENT HISTORY: breast cancer, OSA, a fib, and Lt THA.   PAIN:   Are you having pain? Yes: NPRS scale: 3/10 currently, highest since last PT was 4-5/10 Pain location: Rt knee Pain description: soreness, aching Aggravating factors: bending knee, walking, prolonged positioning Relieving factors: medication  PRECAUTIONS: None  WEIGHT BEARING RESTRICTIONS No  FALLS:  Has patient fallen in last 6 months? No  LIVING ENVIRONMENT: Lives with: lives with their spouse Lives in: House/apartment Stairs: Yes: External: 2 steps; on right going up Has following equipment at home: Single point cane, Walker - 2 wheeled, shower chair, and bed side commode  OCCUPATION: retired  PLOF: Independent and Leisure: spend time with grandchildren, scrap booking, limited exercise, has a pool at home as well  PATIENT GOALS normal walking, regain ROM, improve foot/toe weakness   OBJECTIVE:   PATIENT SURVEYS:  03/17/22: FOTO 65 (predicted 70)  COGNITION: Overall cognitive status: Within functional limits for tasks assessed     SENSATION: Light touch: Impaired  and very sensitive at top of foot  EDEMA:  03/17/22: Circumferential: knee joint line: Rt: 52.5 cm; Lt: 51 cm  LOWER EXTREMITY ROM:  Active ROM Right 03/17/2022 Left 03/17/2022 Right 03/19/2022 Right 03/26/2022  Knee flexion 85   101 AROM in supine heel slide  Knee extension -25 (seated LAQ)   -14 in seated LAQ AROM    (Blank rows = not tested)  LOWER EXTREMITY ROM:  Passive ROM Right 03/17/2022  Left 03/17/2022 Right 03/26/2022  Knee flexion 98  105 in supine heel slide overpressure  Knee extension -8    Ankle dorsiflexion      (Blank rows = not tested)  LOWER EXTREMITY MMT:  MMT Right 03/17/2022 Left 03/17/2022  Hip flexion    Hip extension    Hip abduction    Hip adduction    Hip internal rotation    Hip external rotation    Knee flexion 3-/5   Knee extension 3-/5   Ankle dorsiflexion 3-/5   Ankle plantarflexion    Ankle inversion    Ankle eversion     (Blank rows = not tested)  03/17/22: No active toe extension noted  GAIT: Distance walked: 100 Assistive device utilized: Single point cane Level of assistance: Modified independence Comments: decreased stance on Rt, antalgic gait, decreased hip/knee flexion on Rt   TODAY'S TREATMENT: 03/26/2022 Therex:    SciFit Seat 9, 8 mins BUE/BLE for ROM  Leg press with 3s hold at extension 45* BLE 50 lbs x 15 reps RLE 25 lbs  x15 reps  Seated straight leg raise 4-6" c slow lowering x 15 reps   TherActivity:  Single leg eccentric lowering 4" step posterior and lateral 1 x 5 reps LLE stance and 1 x 10 reps each direction RLE stance, verbal cueing for knee extension  Step over box with rocking back to extension 10 reps LLE swing and 10 reps RLE swing, verbal cueing for hip hike/trunk lean  RLE driving simulation on BOSU with verbal reaction cues, verbal cueing for speed and consistent pressure through LE  Vasopneumatic Rt knee 10 mins medium compression in elevation 34 degrees  03/25/2022 Therex:    Nustep lvl 5 8 mins BLE for ROM  Leg press double leg 50 lbs x 25, single leg 25 lbs x15    Neuro Re-ed:  Standing static balance at //bars eyes open:  -narrow base of support 1 x 30s  -modified tandem both ways 1 x 30s  -tandem stance RLE in back 3 x 10s, pt felt more unsteady  3 direction tapping (anterior, lateral, posterior) x 6 reps each LE  Manual:  Seated Rt knee flexion end range stretch with manual  overpressure  Supine Rt knee extension heel propped end range stretch with manual overpressure  Vasopneumatic Rt knee 10 mins medium compression in elevation 34 degrees  03/19/2022 Therex:    Nustep lvl 5 8 mins UE/LE for ROM  Gastroc stretch incline board bilateral 30 sec x 3  Leg press double leg 50 lbs x 15, single leg x15 25 lbs  Seated LAQ c pause in end ranges flexion/extension 2 x 10   Seated alternating isometric holds 5 sec each way x 12 in 70 deg knee flexion   Supine AAROM heel slide c strap Rt leg 5 sec hold x 10   Manual:  Seated Rt knee flexion/distraction/IR mobilization c movement, Progressive end range stretch overpressure  Vasopneumatic Rt knee 10 mins medium compression in elevation 34 degrees   PATIENT EDUCATION:  Education details: HEP Person educated: Patient Education method: Consulting civil engineer, Media planner, and Handouts Education comprehension: verbalized understanding, returned demonstration, and needs further education   HOME EXERCISE PROGRAM: Access Code: [email or web address] URL: [email or web address] Date: 03/17/2022 Prepared by: Faustino Congress  Exercises - Long Sitting Quad Set with Towel Roll Under Heel  - 5-10 x daily - 7 x weekly - 1 sets - 5-10 reps - 5 sec hold - Seated Knee Extension AROM  - 5-10 x daily - 7 x weekly - 1 sets - 5-10 reps - 5 sec hold - Seated Knee Flexion AAROM  - 5-10 x daily - 7 x weekly - 1 sets - 5-10 reps - 10 sec hold - Supine Heel Slide with Strap  - 3-5 x daily - 7 x weekly - 1 sets - 10 reps - Seated Ankle Alphabet  - 2 x daily - 7 x weekly - 1 sets - 1 a-z - Seated Ankle Pumps on Table  - 2 x daily - 7 x weekly - 1 sets - 10 reps  ASSESSMENT:  CLINICAL IMPRESSION: She presented with higher pain and stiffness this morning. Her knee flexion range of motion has improved and she was able to complete full revolutions on the bike and clear a 4" step with mild hip hiking/trunk lean. Knee extension range of motion  appears to remain limited and she will continue to benefit from an emphasis on knee mobility. Functional strengthening tasks continue to be progressed, especially with quad eccentric control and strength in terminal knee extension.  OBJECTIVE IMPAIRMENTS Abnormal gait, decreased activity tolerance, decreased balance, decreased mobility, decreased ROM, decreased strength, increased edema, increased fascial restrictions, impaired flexibility, and pain.   ACTIVITY LIMITATIONS lifting, bending, sitting, standing, squatting, sleeping, stairs, transfers, bed mobility, and locomotion level  PARTICIPATION LIMITATIONS: cleaning, laundry, driving, community activity, and yard work  PERSONAL FACTORS 3+ comorbidities: breast cancer, OSA, a fib, and L THA.  and complication of Rt foot drop  are also affecting patient's functional outcome.   REHAB POTENTIAL: Good  CLINICAL DECISION MAKING: Evolving/moderate complexity  EVALUATION COMPLEXITY: Moderate   GOALS: Goals reviewed with patient? Yes  SHORT TERM GOALS: Target date: 04/14/2022  Independent with initial HEP Goal status:on going - assessed 03/19/2022  2.  Rt knee AROM 0-10-100 for improved mobility and function. Goal status: on going - assessed 03/19/2022  LONG TERM GOALS: Target date: 05/12/2022  Independent with final HEP Goal status: INITIAL  2.  FOTO score improved to 70 Goal status: INITIAL  3.  Rt knee AROM improved to 0-110 for improved mobility and function Goal status: INIITAL  4.  Report pain < 2/10 with daily functional activity for improved function Goal status: INITIAL  5.  Amb with LRAD with minimal gait deviations for improved functional mobility Goal status: INITIAL  6.  Demonstrate 4/5 dorsiflexion strength for improved function and ability to return to driving Goal status: INITIAL   PLAN: PT FREQUENCY: 2x/week  PT DURATION: 8 weeks  PLANNED INTERVENTIONS: Therapeutic exercises, Therapeutic activity,  Neuromuscular re-education, Balance training, Gait training, Patient/Family education, Joint mobilization, Stair training, Dry Needling, Electrical stimulation, Cryotherapy, Moist heat, scar mobilization, Taping, Vasopneumatic device, Manual therapy, and Re-evaluation  PLAN FOR NEXT SESSION: Assess stairs with and without handrail. Manual for mobility, progressive strengthening, progress static and dynamic balance during walking.   Jana Hakim, SPT 03/26/22 9:56 AM   This entire session of physical therapy was performed under the direct supervision of PT signing evaluation /treatment. PT reviewed note and agrees.  Scot Jun, PT, DPT, OCS, ATC 03/26/22  11:11 AM

## 2022-03-29 ENCOUNTER — Ambulatory Visit (INDEPENDENT_AMBULATORY_CARE_PROVIDER_SITE_OTHER): Payer: Medicare Other | Admitting: Rehabilitative and Restorative Service Providers"

## 2022-03-29 ENCOUNTER — Encounter: Payer: Self-pay | Admitting: Rehabilitative and Restorative Service Providers"

## 2022-03-29 DIAGNOSIS — R2689 Other abnormalities of gait and mobility: Secondary | ICD-10-CM

## 2022-03-29 DIAGNOSIS — M25561 Pain in right knee: Secondary | ICD-10-CM

## 2022-03-29 DIAGNOSIS — M6281 Muscle weakness (generalized): Secondary | ICD-10-CM

## 2022-03-29 DIAGNOSIS — M25661 Stiffness of right knee, not elsewhere classified: Secondary | ICD-10-CM | POA: Diagnosis not present

## 2022-03-29 DIAGNOSIS — M21371 Foot drop, right foot: Secondary | ICD-10-CM

## 2022-03-29 DIAGNOSIS — R600 Localized edema: Secondary | ICD-10-CM

## 2022-03-29 DIAGNOSIS — R29818 Other symptoms and signs involving the nervous system: Secondary | ICD-10-CM

## 2022-03-29 NOTE — Therapy (Signed)
OUTPATIENT PHYSICAL THERAPY TREATMENT   Patient Name: Linda Foster MRN: [id number] DOB:Jan 23, 1956, 66 y.o., female Today's Date: 03/29/2022   PT End of Session - 03/29/22 0929     Visit Number 5    Number of Visits 16    Date for PT Re-Evaluation 05/12/22    Authorization Type Medicare and BCBS    Progress Note Due on Visit 10    PT Start Time 0926    PT Stop Time 1018    PT Time Calculation (min) 52 min    Activity Tolerance Patient tolerated treatment well    Behavior During Therapy [facility] for tasks assessed/performed                 Past Medical History:  Diagnosis Date   Breast cancer ([place]) 2008   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   Breast cancer ([place]) 2019   left ER/PR +,  XRT and chemotherapy   Dysrhythmia    Hx of A.Fib   Family history of adverse reaction to anesthesia    N&V   Family history of prostate cancer    Lymphedema 2013   OSA (obstructive sleep apnea)    uses a dental appliance   Osteoarthritis    Persistent atrial fibrillation (HCC)    History of Afib   PONV (postoperative nausea and vomiting)    Past Surgical History:  Procedure Laterality Date   ABDOMINAL HYSTERECTOMY     ATRIAL FIBRILLATION ABLATION N/A 03/03/2021   Procedure: ATRIAL FIBRILLATION ABLATION;  Surgeon: Thompson Grayer, MD;  Location: [place] [facility];  Service: Cardiovascular;  Laterality: N/A;   BREAST LUMPECTOMY Left 2008   BREAST LUMPECTOMY WITH RADIOACTIVE SEED LOCALIZATION Left 12/05/2017   Procedure: BREAST LUMPECTOMY WITH RADIOACTIVE SEED LOCALIZATION;  Surgeon: Rolm Bookbinder, MD;  Location: [place];  Service: General;  Laterality: Left;   BREAST RECONSTRUCTION Left 2019   no BP on Lt arm   CARDIOVERSION N/A 12/01/2020   Procedure: CARDIOVERSION;  Surgeon: Larey Dresser, MD;  Location: [facility] ENDOSCOPY;  Service: Cardiovascular;  Laterality: N/A;   CESAREAN SECTION      x 3   KNEE ARTHROSCOPY Right 2007   TEE WITHOUT CARDIOVERSION N/A 12/01/2020    Procedure: TRANSESOPHAGEAL ECHOCARDIOGRAM (TEE);  Surgeon: Larey Dresser, MD;  Location: [facility] ENDOSCOPY;  Service: Cardiovascular;  Laterality: N/A;   TOTAL HIP ARTHROPLASTY Left 06/15/2019   Procedure: LEFT TOTAL HIP ARTHROPLASTY ANTERIOR APPROACH;  Surgeon: Mcarthur Rossetti, MD;  Location: [facility];  Service: Orthopedics;  Laterality: Left;   TOTAL KNEE ARTHROPLASTY Right 02/25/2022   Procedure: RIGHT TOTAL KNEE ARTHROPLASTY;  Surgeon: Mcarthur Rossetti, MD;  Location: [place];  Service: Orthopedics;  Laterality: Right;   Patient Active Problem List   Diagnosis Date Noted   Unilateral primary osteoarthritis, right knee 02/25/2022   Status post right knee replacement 02/25/2022   Paroxysmal atrial fibrillation ([place]) 11/25/2020   Unilateral primary osteoarthritis, left hip 06/15/2019   Status post total replacement of left hip 06/15/2019   OSA (obstructive sleep apnea) 02/13/2018   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 01/03/2018   Family history of prostate cancer 01/02/2018   Genetic testing 12/15/2017   Primary malignant neoplasm of lower outer quadrant of female breast, left ([place]) 12/14/2017   History of therapeutic radiation 11/01/2017   Ductal carcinoma in situ (DCIS) of left breast 10/24/2017   Chest pain with moderate risk of acute coronary syndrome 11/03/2016   Atrial fibrillation with rapid ventricular  response ([place]) 11/02/2016   History of invasive breast cancer 11/06/2012    PCP: Carol Ada, MD  REFERRING PROVIDER: Mcarthur Rossetti, MD   REFERRING DIAG: [phone] (ICD-10-CM) - Status post right knee replacement   THERAPY DIAG:  Acute pain of right knee  Stiffness of right knee, not elsewhere classified  Localized edema  Other abnormalities of gait and mobility  Muscle weakness (generalized)  Other symptoms and signs involving the nervous system  Foot drop, right  Rationale for Evaluation and Treatment  Rehabilitation  ONSET DATE: 02/25/22  SUBJECTIVE:   SUBJECTIVE STATEMENT: Pt indicated feeling tightness upon arrival but not specific pain complaints.  Felt ok after last visit.   PERTINENT HISTORY: breast cancer, OSA, a fib, and Lt THA.   PAIN:   Are you having pain? Yes: NPRS scale: 0//10 Pain location: Rt knee Pain description: soreness, aching Aggravating factors: bending knee, walking, prolonged positioning Relieving factors: medication  PRECAUTIONS: None  WEIGHT BEARING RESTRICTIONS No  FALLS:  Has patient fallen in last 6 months? No  LIVING ENVIRONMENT: Lives with: lives with their spouse Lives in: House/apartment Stairs: Yes: External: 2 steps; on right going up Has following equipment at home: Single point cane, Walker - 2 wheeled, shower chair, and bed side commode  OCCUPATION: retired  PLOF: Independent and Leisure: spend time with grandchildren, scrap booking, limited exercise, has a pool at home as well  PATIENT GOALS normal walking, regain ROM, improve foot/toe weakness   OBJECTIVE:   PATIENT SURVEYS:  03/17/22: FOTO 65 (predicted 70)  COGNITION: Overall cognitive status: Within functional limits for tasks assessed     SENSATION: Light touch: Impaired  and very sensitive at top of foot  EDEMA:  03/17/22: Circumferential: knee joint line: Rt: 52.5 cm; Lt: 51 cm  LOWER EXTREMITY ROM:  Active ROM Right 03/17/2022 Left 03/17/2022 Right 03/19/2022 Right 03/26/2022  Knee flexion 85   101 AROM in supine heel slide  Knee extension -25 (seated LAQ)   -14 in seated LAQ AROM    (Blank rows = not tested)  LOWER EXTREMITY ROM:  Passive ROM Right 03/17/2022 Left 03/17/2022 Right 03/26/2022  Knee flexion 98  105 in supine heel slide overpressure  Knee extension -8    Ankle dorsiflexion      (Blank rows = not tested)  LOWER EXTREMITY MMT:  MMT Right 03/17/2022 Left 03/29/2022 Right 03/29/2022  Hip flexion     Hip extension     Hip abduction     Hip  adduction     Hip internal rotation     Hip external rotation     Knee flexion 3-/5    Knee extension 3-/5 5/5 38.5, 36.9 lbs 3+/5 15.2, 15.3 lbs  Ankle dorsiflexion 3-/5    Ankle plantarflexion     Ankle inversion     Ankle eversion      (Blank rows = not tested)  03/17/22: No active toe extension noted  GAIT: Distance walked: 100 Assistive device utilized: Single point cane Level of assistance: Modified independence Comments: decreased stance on Rt, antalgic gait, decreased hip/knee flexion on Rt   TODAY'S TREATMENT: 03/29/2022 Therex:    SciFit Seat 9, 6 mins BUE/BLE for ROM  Seated LAQ 3 lbs c pause in end range flexion/extension 2 x 15   Seated SLR Rt 2 x 10   Incilne board runner stretch Rt leg posterior 30 sec x 3   TherActivity:  Leg press: double leg 62 lbs x 15, Rt single leg 31 lbs  x 15 c slow lowering focus    4 inch lateral step down WB on Rt x 15  Manual  Seated Rt knee flexion c distraction/IR mobilizations   Vasopneumatic Rt knee 10 mins medium compression in elevation 34 degrees  03/26/2022 Therex:    SciFit Seat 9, 8 mins BUE/BLE for ROM  Leg press with 3s hold at extension 45* BLE 50 lbs x 15 reps RLE 25 lbs x15 reps  Seated straight leg raise 4-6" c slow lowering x 15 reps   TherActivity:  Single leg eccentric lowering 4" step posterior and lateral 1 x 5 reps LLE stance and 1 x 10 reps each direction RLE stance, verbal cueing for knee extension  Step over box with rocking back to extension 10 reps LLE swing and 10 reps RLE swing, verbal cueing for hip hike/trunk lean  RLE driving simulation on BOSU with verbal reaction cues, verbal cueing for speed and consistent pressure through LE  Vasopneumatic Rt knee 10 mins medium compression in elevation 34 degrees  03/25/2022 Therex:    Nustep lvl 5 8 mins BLE for ROM  Leg press double leg 50 lbs x 25, single leg 25 lbs x15    Neuro Re-ed:  Standing static balance at //bars eyes open:  -narrow base of  support 1 x 30s  -modified tandem both ways 1 x 30s  -tandem stance RLE in back 3 x 10s, pt felt more unsteady  3 direction tapping (anterior, lateral, posterior) x 6 reps each LE  Manual:  Seated Rt knee flexion end range stretch with manual overpressure  Supine Rt knee extension heel propped end range stretch with manual overpressure  Vasopneumatic Rt knee 10 mins medium compression in elevation 34 degrees    PATIENT EDUCATION:  Education details: HEP Person educated: Patient Education method: Consulting civil engineer, Media planner, and Handouts Education comprehension: verbalized understanding, returned demonstration, and needs further education   HOME EXERCISE PROGRAM: Access Code: [email or web address] URL: [email or web address] Date: 03/17/2022 Prepared by: Faustino Congress  Exercises - Long Sitting Quad Set with Towel Roll Under Heel  - 5-10 x daily - 7 x weekly - 1 sets - 5-10 reps - 5 sec hold - Seated Knee Extension AROM  - 5-10 x daily - 7 x weekly - 1 sets - 5-10 reps - 5 sec hold - Seated Knee Flexion AAROM  - 5-10 x daily - 7 x weekly - 1 sets - 5-10 reps - 10 sec hold - Supine Heel Slide with Strap  - 3-5 x daily - 7 x weekly - 1 sets - 10 reps - Seated Ankle Alphabet  - 2 x daily - 7 x weekly - 1 sets - 1 a-z - Seated Ankle Pumps on Table  - 2 x daily - 7 x weekly - 1 sets - 10 reps  ASSESSMENT:  CLINICAL IMPRESSION: Pt to continue to benefit from progressive strengthening program to improve functional WB control and ability to faciltiate walking, stairs and other daily activity without difficulty.   OBJECTIVE IMPAIRMENTS Abnormal gait, decreased activity tolerance, decreased balance, decreased mobility, decreased ROM, decreased strength, increased edema, increased fascial restrictions, impaired flexibility, and pain.   ACTIVITY LIMITATIONS lifting, bending, sitting, standing, squatting, sleeping, stairs, transfers, bed mobility, and locomotion level  PARTICIPATION  LIMITATIONS: cleaning, laundry, driving, community activity, and yard work  PERSONAL FACTORS 3+ comorbidities: breast cancer, OSA, a fib, and L THA.  and complication of Rt foot drop  are also affecting patient's functional outcome.   REHAB POTENTIAL: Good  CLINICAL DECISION MAKING: Evolving/moderate complexity  EVALUATION COMPLEXITY: Moderate   GOALS: Goals reviewed with patient? Yes  SHORT TERM GOALS: Target date: 04/14/2022  Independent with initial HEP Goal status:on going - assessed 03/29/2022  2.  Rt knee AROM 0-10-100 for improved mobility and function. Goal status: on going - assessed 03/29/2022  LONG TERM GOALS: Target date: 05/12/2022  Independent with final HEP Goal status: on going - assessed 03/29/2022  2.  FOTO score improved to 70 Goal status: on going - assessed 03/29/2022  3.  Rt knee AROM improved to 0-110 for improved mobility and function Goal status: on going - assessed 03/29/2022  4.  Report pain < 2/10 with daily functional activity for improved function Goal status: on going - assessed 03/29/2022  5.  Amb with LRAD with minimal gait deviations for improved functional mobility Goal status: MET- assessed 03/29/2022  6.  Demonstrate 4/5 dorsiflexion strength for improved function and ability to return to driving Goal status: on going - assessed 03/29/2022   PLAN: PT FREQUENCY: 2x/week  PT DURATION: 8 weeks  PLANNED INTERVENTIONS: Therapeutic exercises, Therapeutic activity, Neuromuscular re-education, Balance training, Gait training, Patient/Family education, Joint mobilization, Stair training, Dry Needling, Electrical stimulation, Cryotherapy, Moist heat, scar mobilization, Taping, Vasopneumatic device, Manual therapy, and Re-evaluation  PLAN FOR NEXT SESSION: Progressive strengthening, static balance     Scot Jun, PT, DPT, OCS, ATC 03/29/22  10:27 AM

## 2022-03-31 ENCOUNTER — Ambulatory Visit (INDEPENDENT_AMBULATORY_CARE_PROVIDER_SITE_OTHER): Payer: Medicare Other | Admitting: Rehabilitative and Restorative Service Providers"

## 2022-03-31 ENCOUNTER — Encounter: Payer: Self-pay | Admitting: Rehabilitative and Restorative Service Providers"

## 2022-03-31 DIAGNOSIS — M25561 Pain in right knee: Secondary | ICD-10-CM | POA: Diagnosis not present

## 2022-03-31 DIAGNOSIS — R29818 Other symptoms and signs involving the nervous system: Secondary | ICD-10-CM

## 2022-03-31 DIAGNOSIS — R600 Localized edema: Secondary | ICD-10-CM | POA: Diagnosis not present

## 2022-03-31 DIAGNOSIS — M21371 Foot drop, right foot: Secondary | ICD-10-CM

## 2022-03-31 DIAGNOSIS — M25661 Stiffness of right knee, not elsewhere classified: Secondary | ICD-10-CM

## 2022-03-31 DIAGNOSIS — R2689 Other abnormalities of gait and mobility: Secondary | ICD-10-CM

## 2022-03-31 DIAGNOSIS — M6281 Muscle weakness (generalized): Secondary | ICD-10-CM

## 2022-03-31 NOTE — Therapy (Signed)
OUTPATIENT PHYSICAL THERAPY TREATMENT   Patient Name: Linda Foster MRN: [id number] DOB:16-Nov-1955, 66 y.o., female Today's Date: 03/31/2022   PT End of Session - 03/31/22 0926     Visit Number 6    Number of Visits 16    Date for PT Re-Evaluation 05/12/22    Authorization Type Medicare and BCBS    Progress Note Due on Visit 10    PT Start Time 0927    PT Stop Time 1018    PT Time Calculation (min) 51 min    Activity Tolerance Patient tolerated treatment well    Behavior During Therapy [facility] for tasks assessed/performed                  Past Medical History:  Diagnosis Date   Breast cancer ([place]) 2008   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   Breast cancer ([place]) 2019   left ER/PR +,  XRT and chemotherapy   Dysrhythmia    Hx of A.Fib   Family history of adverse reaction to anesthesia    N&V   Family history of prostate cancer    Lymphedema 2013   OSA (obstructive sleep apnea)    uses a dental appliance   Osteoarthritis    Persistent atrial fibrillation (HCC)    History of Afib   PONV (postoperative nausea and vomiting)    Past Surgical History:  Procedure Laterality Date   ABDOMINAL HYSTERECTOMY     ATRIAL FIBRILLATION ABLATION N/A 03/03/2021   Procedure: ATRIAL FIBRILLATION ABLATION;  Surgeon: Thompson Grayer, MD;  Location: [place] [facility];  Service: Cardiovascular;  Laterality: N/A;   BREAST LUMPECTOMY Left 2008   BREAST LUMPECTOMY WITH RADIOACTIVE SEED LOCALIZATION Left 12/05/2017   Procedure: BREAST LUMPECTOMY WITH RADIOACTIVE SEED LOCALIZATION;  Surgeon: Rolm Bookbinder, MD;  Location: [place];  Service: General;  Laterality: Left;   BREAST RECONSTRUCTION Left 2019   no BP on Lt arm   CARDIOVERSION N/A 12/01/2020   Procedure: CARDIOVERSION;  Surgeon: Larey Dresser, MD;  Location: [facility] ENDOSCOPY;  Service: Cardiovascular;  Laterality: N/A;   CESAREAN SECTION      x 3   KNEE ARTHROSCOPY Right 2007   TEE WITHOUT CARDIOVERSION N/A 12/01/2020    Procedure: TRANSESOPHAGEAL ECHOCARDIOGRAM (TEE);  Surgeon: Larey Dresser, MD;  Location: [facility] ENDOSCOPY;  Service: Cardiovascular;  Laterality: N/A;   TOTAL HIP ARTHROPLASTY Left 06/15/2019   Procedure: LEFT TOTAL HIP ARTHROPLASTY ANTERIOR APPROACH;  Surgeon: Mcarthur Rossetti, MD;  Location: [facility];  Service: Orthopedics;  Laterality: Left;   TOTAL KNEE ARTHROPLASTY Right 02/25/2022   Procedure: RIGHT TOTAL KNEE ARTHROPLASTY;  Surgeon: Mcarthur Rossetti, MD;  Location: [place];  Service: Orthopedics;  Laterality: Right;   Patient Active Problem List   Diagnosis Date Noted   Unilateral primary osteoarthritis, right knee 02/25/2022   Status post right knee replacement 02/25/2022   Paroxysmal atrial fibrillation ([place]) 11/25/2020   Unilateral primary osteoarthritis, left hip 06/15/2019   Status post total replacement of left hip 06/15/2019   OSA (obstructive sleep apnea) 02/13/2018   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 01/03/2018   Family history of prostate cancer 01/02/2018   Genetic testing 12/15/2017   Primary malignant neoplasm of lower outer quadrant of female breast, left ([place]) 12/14/2017   History of therapeutic radiation 11/01/2017   Ductal carcinoma in situ (DCIS) of left breast 10/24/2017   Chest pain with moderate risk of acute coronary syndrome 11/03/2016   Atrial fibrillation with rapid  ventricular response ([place]) 11/02/2016   History of invasive breast cancer 11/06/2012    PCP: Carol Ada, MD  REFERRING PROVIDER: Mcarthur Rossetti, MD   REFERRING DIAG: [phone] (ICD-10-CM) - Status post right knee replacement   THERAPY DIAG:  Acute pain of right knee  Stiffness of right knee, not elsewhere classified  Localized edema  Other abnormalities of gait and mobility  Muscle weakness (generalized)  Other symptoms and signs involving the nervous system  Foot drop, right  Rationale for Evaluation and Treatment  Rehabilitation  ONSET DATE: 02/25/22  SUBJECTIVE:   SUBJECTIVE STATEMENT: Pt indicated having some foot complaints yesterday that required her to put ice on it.  Reported Rt knee doing ok without pain at rest.  Pt indicated nighttime still can be difficult to sleep like normal.   PERTINENT HISTORY: breast cancer, OSA, a fib, and Lt THA.   PAIN:   NPRS scale: 0//10 Pain location: Rt knee Pain description: soreness, aching Aggravating factors: bending knee, walking, prolonged positioning Relieving factors: medication  PRECAUTIONS: None  WEIGHT BEARING RESTRICTIONS No  FALLS:  Has patient fallen in last 6 months? No  LIVING ENVIRONMENT: Lives with: lives with their spouse Lives in: House/apartment Stairs: Yes: External: 2 steps; on right going up Has following equipment at home: Single point cane, Walker - 2 wheeled, shower chair, and bed side commode  OCCUPATION: retired  PLOF: Independent and Leisure: spend time with grandchildren, scrap booking, limited exercise, has a pool at home as well  PATIENT GOALS normal walking, regain ROM, improve foot/toe weakness   OBJECTIVE:   PATIENT SURVEYS:  03/17/22: FOTO 65 (predicted 70)  COGNITION: Overall cognitive status: Within functional limits for tasks assessed     SENSATION: Light touch: Impaired  and very sensitive at top of foot  EDEMA:  03/17/22: Circumferential: knee joint line: Rt: 52.5 cm; Lt: 51 cm  LOWER EXTREMITY ROM:  Active ROM Right 03/17/2022 Left 03/17/2022 Right 03/19/2022 Right 03/26/2022  Knee flexion 85   101 AROM in supine heel slide  Knee extension -25 (seated LAQ)   -14 in seated LAQ AROM    (Blank rows = not tested)  LOWER EXTREMITY ROM:  Passive ROM Right 03/17/2022 Left 03/17/2022 Right 03/26/2022  Knee flexion 98  105 in supine heel slide overpressure  Knee extension -8    Ankle dorsiflexion      (Blank rows = not tested)  LOWER EXTREMITY MMT:  MMT Right 03/17/2022 Left 03/29/2022  Right 03/29/2022  Hip flexion     Hip extension     Hip abduction     Hip adduction     Hip internal rotation     Hip external rotation     Knee flexion 3-/5    Knee extension 3-/5 5/5 38.5, 36.9 lbs 3+/5 15.2, 15.3 lbs  Ankle dorsiflexion 3-/5    Ankle plantarflexion     Ankle inversion     Ankle eversion      (Blank rows = not tested)  03/17/22: No active toe extension noted  GAIT: Distance walked: 100 Assistive device utilized: Single point cane Level of assistance: Modified independence Comments: decreased stance on Rt, antalgic gait, decreased hip/knee flexion on Rt   TODAY'S TREATMENT: 03/31/2022 Therex:    Recumbent bike lvl 1 6 mins   Seated LAQ 5 lbs c pause in end range flexion/extension 2 x 10  Seated SLR Rt 2 x 10   Seated quad set review for home use  Review of HEP updates c cues  for techniques.    TherActivity:  Leg press: Rt single leg 31 lbs 2 x 15 c slow lowering focus    4 inch lateral step down WB on Rt x 15  Sit to stand to sit transfer 20 inch without UE x 10 (cues for home use with slow lowering)  Manual  Seated Rt knee flexion c distraction/IR mobilizations.  Seated contract/relax to Rt quad for flexion mobility gains.     Vasopneumatic Rt knee 10 mins medium compression in elevation 34 degrees  03/29/2022 Therex:    SciFit Seat 9, 6 mins BUE/BLE for ROM  Seated LAQ 3 lbs c pause in end range flexion/extension 2 x 15   Seated SLR Rt 2 x 10   Incilne board runner stretch Rt leg posterior 30 sec x 3   TherActivity:  Leg press: double leg 62 lbs x 15, Rt single leg 31 lbs x 15 c slow lowering focus    4 inch lateral step down WB on Rt x 15  Manual  Seated Rt knee flexion c distraction/IR mobilizations   Vasopneumatic Rt knee 10 mins medium compression in elevation 34 degrees  03/26/2022 Therex:    SciFit Seat 9, 8 mins BUE/BLE for ROM  Leg press with 3s hold at extension 45* BLE 50 lbs x 15 reps RLE 25 lbs x15 reps  Seated straight leg  raise 4-6" c slow lowering x 15 reps   TherActivity:  Single leg eccentric lowering 4" step posterior and lateral 1 x 5 reps LLE stance and 1 x 10 reps each direction RLE stance, verbal cueing for knee extension  Step over box with rocking back to extension 10 reps LLE swing and 10 reps RLE swing, verbal cueing for hip hike/trunk lean  RLE driving simulation on BOSU with verbal reaction cues, verbal cueing for speed and consistent pressure through LE  Vasopneumatic Rt knee 10 mins medium compression in elevation 34 degrees   PATIENT EDUCATION:  03/31/2022 Education details: HEP progression  Person educated: Patient Education method: Consulting civil engineer, Media planner, and Handouts Education comprehension: verbalized understanding, returned demonstration, and needs further education   HOME EXERCISE PROGRAM: Access Code: [email or web address] URL: [email or web address] Date: 03/31/2022 Prepared by: Scot Jun  Exercises - Seated Knee Extension AROM  - 5-10 x daily - 7 x weekly - 1 sets - 5-10 reps - 5 sec hold - Seated Knee Flexion AAROM  - 5-10 x daily - 7 x weekly - 1 sets - 5-10 reps - 10 sec hold - Seated Ankle Pumps on Table  - 2 x daily - 7 x weekly - 1 sets - 10 reps - Seated Quad Set  - 3-5 x daily - 7 x weekly - 1 sets - 10 reps - 5 hold - Seated Straight Leg Heel Taps  - 1-2 x daily - 7 x weekly - 3 sets - 10 reps - Sit to Stand  - 1 x daily - 7 x weekly - 3 sets - 10 reps - Gastroc Stretch on Wall  - 1-2 x daily - 7 x weekly - 1 sets - 3-5 reps - 15 hold  ASSESSMENT:  CLINICAL IMPRESSION: Review and progression of HEP to include some interventions from last few visits c good knowledge noted from Pt.  Continued emphasis on improving overall mobility and quad strength for functional improvements in daily life.  Skilled PT services to continue to help improve.   OBJECTIVE IMPAIRMENTS Abnormal gait, decreased activity tolerance, decreased balance, decreased mobility, decreased  ROM, decreased  strength, increased edema, increased fascial restrictions, impaired flexibility, and pain.   ACTIVITY LIMITATIONS lifting, bending, sitting, standing, squatting, sleeping, stairs, transfers, bed mobility, and locomotion level  PARTICIPATION LIMITATIONS: cleaning, laundry, driving, community activity, and yard work  PERSONAL FACTORS 3+ comorbidities: breast cancer, OSA, a fib, and L THA.  and complication of Rt foot drop  are also affecting patient's functional outcome.   REHAB POTENTIAL: Good  CLINICAL DECISION MAKING: Evolving/moderate complexity  EVALUATION COMPLEXITY: Moderate   GOALS: Goals reviewed with patient? Yes  SHORT TERM GOALS: Target date: 04/14/2022  Independent with initial HEP Goal status:on going - assessed 03/29/2022  2.  Rt knee AROM 0-10-100 for improved mobility and function. Goal status: on going - assessed 03/29/2022  LONG TERM GOALS: Target date: 05/12/2022  Independent with final HEP Goal status: on going - assessed 03/29/2022  2.  FOTO score improved to 70 Goal status: on going - assessed 03/29/2022  3.  Rt knee AROM improved to 0-110 for improved mobility and function Goal status: on going - assessed 03/29/2022  4.  Report pain < 2/10 with daily functional activity for improved function Goal status: on going - assessed 03/29/2022  5.  Amb with LRAD with minimal gait deviations for improved functional mobility Goal status: MET- assessed 03/29/2022  6.  Demonstrate 4/5 dorsiflexion strength for improved function and ability to return to driving Goal status: on going - assessed 03/29/2022   PLAN: PT FREQUENCY: 2x/week  PT DURATION: 8 weeks  PLANNED INTERVENTIONS: Therapeutic exercises, Therapeutic activity, Neuromuscular re-education, Balance training, Gait training, Patient/Family education, Joint mobilization, Stair training, Dry Needling, Electrical stimulation, Cryotherapy, Moist heat, scar mobilization, Taping, Vasopneumatic device,  Manual therapy, and Re-evaluation  PLAN FOR NEXT SESSION: Static balance/compliant surface as able.  Progressive quad strengthening continued.    Scot Jun, PT, DPT, OCS, ATC 03/31/22  10:14 AM

## 2022-04-05 ENCOUNTER — Encounter: Payer: Self-pay | Admitting: Rehabilitative and Restorative Service Providers"

## 2022-04-05 ENCOUNTER — Ambulatory Visit (INDEPENDENT_AMBULATORY_CARE_PROVIDER_SITE_OTHER): Payer: Medicare Other | Admitting: Rehabilitative and Restorative Service Providers"

## 2022-04-05 DIAGNOSIS — R600 Localized edema: Secondary | ICD-10-CM | POA: Diagnosis not present

## 2022-04-05 DIAGNOSIS — R2689 Other abnormalities of gait and mobility: Secondary | ICD-10-CM

## 2022-04-05 DIAGNOSIS — M25661 Stiffness of right knee, not elsewhere classified: Secondary | ICD-10-CM

## 2022-04-05 DIAGNOSIS — M21371 Foot drop, right foot: Secondary | ICD-10-CM

## 2022-04-05 DIAGNOSIS — M25561 Pain in right knee: Secondary | ICD-10-CM | POA: Diagnosis not present

## 2022-04-05 DIAGNOSIS — R29818 Other symptoms and signs involving the nervous system: Secondary | ICD-10-CM

## 2022-04-05 DIAGNOSIS — M6281 Muscle weakness (generalized): Secondary | ICD-10-CM

## 2022-04-05 NOTE — Therapy (Signed)
OUTPATIENT PHYSICAL THERAPY TREATMENT   Patient Name: Linda Foster MRN: [id number] DOB:10-25-1955, 66 y.o., female Today's Date: 04/05/2022   PT End of Session - 04/05/22 0934     Visit Number 7    Number of Visits 16    Date for PT Re-Evaluation 05/12/22    Authorization Type Medicare and BCBS    Progress Note Due on Visit 10    PT Start Time 0930    PT Stop Time 1020    PT Time Calculation (min) 50 min    Activity Tolerance Patient tolerated treatment well    Behavior During Therapy [facility] for tasks assessed/performed                   Past Medical History:  Diagnosis Date   Breast cancer ([place]) 2008   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   Breast cancer ([place]) 2019   left ER/PR +,  XRT and chemotherapy   Dysrhythmia    Hx of A.Fib   Family history of adverse reaction to anesthesia    N&V   Family history of prostate cancer    Lymphedema 2013   OSA (obstructive sleep apnea)    uses a dental appliance   Osteoarthritis    Persistent atrial fibrillation (HCC)    History of Afib   PONV (postoperative nausea and vomiting)    Past Surgical History:  Procedure Laterality Date   ABDOMINAL HYSTERECTOMY     ATRIAL FIBRILLATION ABLATION N/A 03/03/2021   Procedure: ATRIAL FIBRILLATION ABLATION;  Surgeon: Thompson Grayer, MD;  Location: [place] [facility];  Service: Cardiovascular;  Laterality: N/A;   BREAST LUMPECTOMY Left 2008   BREAST LUMPECTOMY WITH RADIOACTIVE SEED LOCALIZATION Left 12/05/2017   Procedure: BREAST LUMPECTOMY WITH RADIOACTIVE SEED LOCALIZATION;  Surgeon: Rolm Bookbinder, MD;  Location: [place];  Service: General;  Laterality: Left;   BREAST RECONSTRUCTION Left 2019   no BP on Lt arm   CARDIOVERSION N/A 12/01/2020   Procedure: CARDIOVERSION;  Surgeon: Larey Dresser, MD;  Location: [facility] ENDOSCOPY;  Service: Cardiovascular;  Laterality: N/A;   CESAREAN SECTION      x 3   KNEE ARTHROSCOPY Right 2007   TEE WITHOUT CARDIOVERSION N/A  12/01/2020   Procedure: TRANSESOPHAGEAL ECHOCARDIOGRAM (TEE);  Surgeon: Larey Dresser, MD;  Location: [facility] ENDOSCOPY;  Service: Cardiovascular;  Laterality: N/A;   TOTAL HIP ARTHROPLASTY Left 06/15/2019   Procedure: LEFT TOTAL HIP ARTHROPLASTY ANTERIOR APPROACH;  Surgeon: Mcarthur Rossetti, MD;  Location: [facility];  Service: Orthopedics;  Laterality: Left;   TOTAL KNEE ARTHROPLASTY Right 02/25/2022   Procedure: RIGHT TOTAL KNEE ARTHROPLASTY;  Surgeon: Mcarthur Rossetti, MD;  Location: [place];  Service: Orthopedics;  Laterality: Right;   Patient Active Problem List   Diagnosis Date Noted   Unilateral primary osteoarthritis, right knee 02/25/2022   Status post right knee replacement 02/25/2022   Paroxysmal atrial fibrillation ([place]) 11/25/2020   Unilateral primary osteoarthritis, left hip 06/15/2019   Status post total replacement of left hip 06/15/2019   OSA (obstructive sleep apnea) 02/13/2018   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 01/03/2018   Family history of prostate cancer 01/02/2018   Genetic testing 12/15/2017   Primary malignant neoplasm of lower outer quadrant of female breast, left ([place]) 12/14/2017   History of therapeutic radiation 11/01/2017   Ductal carcinoma in situ (DCIS) of left breast 10/24/2017   Chest pain with moderate risk of acute coronary syndrome 11/03/2016   Atrial fibrillation with  rapid ventricular response ([place]) 11/02/2016   History of invasive breast cancer 11/06/2012    PCP: Carol Ada, MD  REFERRING PROVIDER: Mcarthur Rossetti, MD   REFERRING DIAG: [phone] (ICD-10-CM) - Status post right knee replacement   THERAPY DIAG:  Acute pain of right knee  Stiffness of right knee, not elsewhere classified  Localized edema  Other abnormalities of gait and mobility  Muscle weakness (generalized)  Other symptoms and signs involving the nervous system  Foot drop, right  Rationale for Evaluation and Treatment  Rehabilitation  ONSET DATE: 02/25/22  SUBJECTIVE:   SUBJECTIVE STATEMENT: Pt indicated not really having pain in knee to report over the weekend.  Pt indicated some discomfort for lying down/sleeping.  Pt indicated having some pain in foot.   PERTINENT HISTORY: breast cancer, OSA, a fib, and Lt THA.   PAIN:   NPRS scale: 0/10 knee, 3/10 in foot Pain location: Rt knee, Rt foot Pain description: soreness, aching Aggravating factors: discomfort with lying down, nighttime.  Relieving factors: medication  PRECAUTIONS: None  WEIGHT BEARING RESTRICTIONS No  FALLS:  Has patient fallen in last 6 months? No  LIVING ENVIRONMENT: Lives with: lives with their spouse Lives in: House/apartment Stairs: Yes: External: 2 steps; on right going up Has following equipment at home: Single point cane, Walker - 2 wheeled, shower chair, and bed side commode  OCCUPATION: retired  PLOF: Independent and Leisure: spend time with grandchildren, scrap booking, limited exercise, has a pool at home as well  PATIENT GOALS normal walking, regain ROM, improve foot/toe weakness   OBJECTIVE:   PATIENT SURVEYS:  03/17/22: FOTO 65 (predicted 70)  COGNITION: Overall cognitive status: Within functional limits for tasks assessed     SENSATION: Light touch: Impaired  and very sensitive at top of foot  EDEMA:  03/17/22: Circumferential: knee joint line: Rt: 52.5 cm; Lt: 51 cm  LOWER EXTREMITY ROM:  Active ROM Right 03/17/2022 Left 03/17/2022 Right 03/19/2022 Right 03/26/2022 Right 04/05/2022  Knee flexion 85   101 AROM in supine heel slide AROM 110 in supine heel slide  Knee extension -25 (seated LAQ)   -14 in seated LAQ AROM  -5 in seated LAQ AROM   (Blank rows = not tested)  LOWER EXTREMITY ROM:  Passive ROM Right 03/17/2022 Left 03/17/2022 Right 03/26/2022  Knee flexion 98  105 in supine heel slide overpressure  Knee extension -8    Ankle dorsiflexion      (Blank rows = not tested)  LOWER EXTREMITY  MMT:  MMT Right 03/17/2022 Left 03/29/2022 Right 03/29/2022  Hip flexion     Hip extension     Hip abduction     Hip adduction     Hip internal rotation     Hip external rotation     Knee flexion 3-/5    Knee extension 3-/5 5/5 38.5, 36.9 lbs 3+/5 15.2, 15.3 lbs  Ankle dorsiflexion 3-/5    Ankle plantarflexion     Ankle inversion     Ankle eversion      (Blank rows = not tested)  03/17/22: No active toe extension noted  GAIT: Distance walked: 100 Assistive device utilized: Single point cane Level of assistance: Modified independence Comments: decreased stance on Rt, antalgic gait, decreased hip/knee flexion on Rt   TODAY'S TREATMENT: 04/05/2022 Therex:    Recumbent bike lvl 2 7 mins - seat 7  Incline board runner stretch Rt leg 30 sec x 3  Supine AROM heel slide Rt leg x5 5 sec hold  Neuro Re-ed  SLS c vector reaching fwd/lateral/back contralateral leg light touch x 8 bilateral   Tandem stance on foam 1 min x 2 bilateral occasional hand assist required on bar   TherActivity:  Leg press: Rt single leg 37 lbs 2 x 15 c slow lowering focus , end range pause 2 seconds each way   6 inch step up on and down forward WB on Rt 2 x 10     Vasopneumatic Rt knee 10 mins medium compression in elevation 34 degrees  03/31/2022 Therex:    Recumbent bike lvl 1 6 mins   Seated LAQ 5 lbs c pause in end range flexion/extension 2 x 10  Seated SLR Rt 2 x 10   Seated quad set review for home use  Review of HEP updates c cues for techniques.    TherActivity:  Leg press: Rt single leg 31 lbs 2 x 15 c slow lowering focus    4 inch lateral step down WB on Rt x 15  Sit to stand to sit transfer 20 inch without UE x 10 (cues for home use with slow lowering)  Manual  Seated Rt knee flexion c distraction/IR mobilizations.  Seated contract/relax to Rt quad for flexion mobility gains.     Vasopneumatic Rt knee 10 mins medium compression in elevation 34 degrees  03/29/2022 Therex:    SciFit  Seat 9, 6 mins BUE/BLE for ROM  Seated LAQ 3 lbs c pause in end range flexion/extension 2 x 15   Seated SLR Rt 2 x 10   Incilne board runner stretch Rt leg posterior 30 sec x 3   TherActivity:  Leg press: double leg 62 lbs x 15, Rt single leg 31 lbs x 15 c slow lowering focus    4 inch lateral step down WB on Rt x 15  Manual  Seated Rt knee flexion c distraction/IR mobilizations   Vasopneumatic Rt knee 10 mins medium compression in elevation 34 degrees   PATIENT EDUCATION:  03/31/2022 Education details: HEP progression  Person educated: Patient Education method: Consulting civil engineer, Media planner, and Handouts Education comprehension: verbalized understanding, returned demonstration, and needs further education   HOME EXERCISE PROGRAM: Access Code: [email or web address] URL: [email or web address] Date: 03/31/2022 Prepared by: Scot Jun  Exercises - Seated Knee Extension AROM  - 5-10 x daily - 7 x weekly - 1 sets - 5-10 reps - 5 sec hold - Seated Knee Flexion AAROM  - 5-10 x daily - 7 x weekly - 1 sets - 5-10 reps - 10 sec hold - Seated Ankle Pumps on Table  - 2 x daily - 7 x weekly - 1 sets - 10 reps - Seated Quad Set  - 3-5 x daily - 7 x weekly - 1 sets - 10 reps - 5 hold - Seated Straight Leg Heel Taps  - 1-2 x daily - 7 x weekly - 3 sets - 10 reps - Sit to Stand  - 1 x daily - 7 x weekly - 3 sets - 10 reps - Gastroc Stretch on Wall  - 1-2 x daily - 7 x weekly - 1 sets - 3-5 reps - 15 hold  ASSESSMENT:  CLINICAL IMPRESSION: AROM continued to show improvements at this time.  Progressive strengthening and WB making improvements at this time with continued room for improvement to help improve WB activity.  Continued skilled PT services indicated at this time.   OBJECTIVE IMPAIRMENTS Abnormal gait, decreased activity tolerance, decreased balance, decreased mobility, decreased ROM, decreased strength, increased edema,  increased fascial restrictions, impaired flexibility, and  pain.   ACTIVITY LIMITATIONS lifting, bending, sitting, standing, squatting, sleeping, stairs, transfers, bed mobility, and locomotion level  PARTICIPATION LIMITATIONS: cleaning, laundry, driving, community activity, and yard work  PERSONAL FACTORS 3+ comorbidities: breast cancer, OSA, a fib, and L THA.  and complication of Rt foot drop  are also affecting patient's functional outcome.   REHAB POTENTIAL: Good  CLINICAL DECISION MAKING: Evolving/moderate complexity  EVALUATION COMPLEXITY: Moderate   GOALS: Goals reviewed with patient? Yes  SHORT TERM GOALS: Target date: 04/14/2022  Independent with initial HEP Goal status:on going - assessed 03/29/2022  2.  Rt knee AROM 0-10-100 for improved mobility and function. Goal status: on going - assessed 03/29/2022  LONG TERM GOALS: Target date: 05/12/2022  Independent with final HEP Goal status: on going - assessed 03/29/2022  2.  FOTO score improved to 70 Goal status: on going - assessed 03/29/2022  3.  Rt knee AROM improved to 0-110 for improved mobility and function Goal status: on going - assessed 03/29/2022  4.  Report pain < 2/10 with daily functional activity for improved function Goal status: on going - assessed 03/29/2022  5.  Amb with LRAD with minimal gait deviations for improved functional mobility Goal status: MET- assessed 03/29/2022  6.  Demonstrate 4/5 dorsiflexion strength for improved function and ability to return to driving Goal status: on going - assessed 03/29/2022   PLAN: PT FREQUENCY: 2x/week  PT DURATION: 8 weeks  PLANNED INTERVENTIONS: Therapeutic exercises, Therapeutic activity, Neuromuscular re-education, Balance training, Gait training, Patient/Family education, Joint mobilization, Stair training, Dry Needling, Electrical stimulation, Cryotherapy, Moist heat, scar mobilization, Taping, Vasopneumatic device, Manual therapy, and Re-evaluation  PLAN FOR NEXT SESSION: MD note for follow up visit.     Scot Jun, PT, DPT, OCS, ATC 04/05/22  10:15 AM

## 2022-04-07 ENCOUNTER — Encounter: Payer: Self-pay | Admitting: Rehabilitative and Restorative Service Providers"

## 2022-04-07 ENCOUNTER — Ambulatory Visit (INDEPENDENT_AMBULATORY_CARE_PROVIDER_SITE_OTHER): Payer: Medicare Other | Admitting: Rehabilitative and Restorative Service Providers"

## 2022-04-07 DIAGNOSIS — R29818 Other symptoms and signs involving the nervous system: Secondary | ICD-10-CM

## 2022-04-07 DIAGNOSIS — M25561 Pain in right knee: Secondary | ICD-10-CM

## 2022-04-07 DIAGNOSIS — M25661 Stiffness of right knee, not elsewhere classified: Secondary | ICD-10-CM | POA: Diagnosis not present

## 2022-04-07 DIAGNOSIS — R600 Localized edema: Secondary | ICD-10-CM | POA: Diagnosis not present

## 2022-04-07 DIAGNOSIS — R2689 Other abnormalities of gait and mobility: Secondary | ICD-10-CM

## 2022-04-07 DIAGNOSIS — M21371 Foot drop, right foot: Secondary | ICD-10-CM

## 2022-04-07 DIAGNOSIS — M6281 Muscle weakness (generalized): Secondary | ICD-10-CM

## 2022-04-07 NOTE — Therapy (Signed)
OUTPATIENT PHYSICAL THERAPY TREATMENT / PROGRESS NOTE   Patient Name: Linda Foster MRN: [id number] DOB:April 19, 1956, 66 y.o., female Today's Date: 04/07/2022  Progress Note Reporting Period 03/17/2022 to 04/07/2022  See note below for Objective Data and Assessment of Progress/Goals.       PT End of Session - 04/07/22 0923     Visit Number 8    Number of Visits 16    Date for PT Re-Evaluation 05/12/22    Authorization Type Medicare and BCBS    Progress Note Due on Visit 10    PT Start Time 0924    PT Stop Time 1015    PT Time Calculation (min) 51 min    Activity Tolerance Patient tolerated treatment well    Behavior During Therapy [facility] for tasks assessed/performed              Past Medical History:  Diagnosis Date   Breast cancer ([place]) 2008   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   Breast cancer ([place]) 2019   left ER/PR +,  XRT and chemotherapy   Dysrhythmia    Hx of A.Fib   Family history of adverse reaction to anesthesia    N&V   Family history of prostate cancer    Lymphedema 2013   OSA (obstructive sleep apnea)    uses a dental appliance   Osteoarthritis    Persistent atrial fibrillation (HCC)    History of Afib   PONV (postoperative nausea and vomiting)    Past Surgical History:  Procedure Laterality Date   ABDOMINAL HYSTERECTOMY     ATRIAL FIBRILLATION ABLATION N/A 03/03/2021   Procedure: ATRIAL FIBRILLATION ABLATION;  Surgeon: Thompson Grayer, MD;  Location: [place] [facility];  Service: Cardiovascular;  Laterality: N/A;   BREAST LUMPECTOMY Left 2008   BREAST LUMPECTOMY WITH RADIOACTIVE SEED LOCALIZATION Left 12/05/2017   Procedure: BREAST LUMPECTOMY WITH RADIOACTIVE SEED LOCALIZATION;  Surgeon: Rolm Bookbinder, MD;  Location: [place];  Service: General;  Laterality: Left;   BREAST RECONSTRUCTION Left 2019   no BP on Lt arm   CARDIOVERSION N/A 12/01/2020   Procedure: CARDIOVERSION;  Surgeon: Larey Dresser, MD;  Location: [facility];   Service: Cardiovascular;  Laterality: N/A;   CESAREAN SECTION      x 3   KNEE ARTHROSCOPY Right 2007   TEE WITHOUT CARDIOVERSION N/A 12/01/2020   Procedure: TRANSESOPHAGEAL ECHOCARDIOGRAM (TEE);  Surgeon: Larey Dresser, MD;  Location: [facility] ENDOSCOPY;  Service: Cardiovascular;  Laterality: N/A;   TOTAL HIP ARTHROPLASTY Left 06/15/2019   Procedure: LEFT TOTAL HIP ARTHROPLASTY ANTERIOR APPROACH;  Surgeon: Mcarthur Rossetti, MD;  Location: [facility];  Service: Orthopedics;  Laterality: Left;   TOTAL KNEE ARTHROPLASTY Right 02/25/2022   Procedure: RIGHT TOTAL KNEE ARTHROPLASTY;  Surgeon: Mcarthur Rossetti, MD;  Location: [place];  Service: Orthopedics;  Laterality: Right;   Patient Active Problem List   Diagnosis Date Noted   Unilateral primary osteoarthritis, right knee 02/25/2022   Status post right knee replacement 02/25/2022   Paroxysmal atrial fibrillation ([place]) 11/25/2020   Unilateral primary osteoarthritis, left hip 06/15/2019   Status post total replacement of left hip 06/15/2019   OSA (obstructive sleep apnea) 02/13/2018   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 01/03/2018   Family history of prostate cancer 01/02/2018   Genetic testing 12/15/2017   Primary malignant neoplasm of lower outer quadrant of female breast, left ([place]) 12/14/2017   History of therapeutic radiation 11/01/2017   Ductal carcinoma in situ (DCIS)  of left breast 10/24/2017   Chest pain with moderate risk of acute coronary syndrome 11/03/2016   Atrial fibrillation with rapid ventricular response ([place]) 11/02/2016   History of invasive breast cancer 11/06/2012    PCP: Carol Ada, MD  REFERRING PROVIDER: Mcarthur Rossetti, MD   REFERRING DIAG: [id number] (ICD-10-CM) - Status post right knee replacement   THERAPY DIAG:  Acute pain of right knee  Stiffness of right knee, not elsewhere classified  Localized edema  Other abnormalities of gait and mobility  Muscle weakness  (generalized)  Other symptoms and signs involving the nervous system  Foot drop, right  Rationale for Evaluation and Treatment Rehabilitation  ONSET DATE: 02/25/2022  SUBJECTIVE:   SUBJECTIVE STATEMENT: Pt indicated no specific complaints. GROC +6 at this time ( a great deal better).   PERTINENT HISTORY: breast cancer, OSA, a fib, and Lt THA.   PAIN:   NPRS scale: 0/10 upon arrival (knee), at worst in last week 3/10 (knee) Pain location: Rt knee, Rt foot Pain description: soreness, aching Aggravating factors: discomfort with lying down, nighttime.  Relieving factors: medication  PRECAUTIONS: None  WEIGHT BEARING RESTRICTIONS No  FALLS:  Has patient fallen in last 6 months? No  LIVING ENVIRONMENT: Lives with: lives with their spouse Lives in: House/apartment Stairs: Yes: External: 2 steps; on right going up Has following equipment at home: Single point cane, Walker - 2 wheeled, shower chair, and bed side commode  OCCUPATION: retired  PLOF: Independent and Leisure: spend time with grandchildren, scrap booking, limited exercise, has a pool at home as well  PATIENT GOALS normal walking, regain ROM, improve foot/toe weakness   OBJECTIVE:   PATIENT SURVEYS:  04/07/2022: FOTO update: 67  03/17/22: FOTO 65 (predicted 70)  COGNITION: 03/17/2022: Overall cognitive status: Within functional limits for tasks assessed     SENSATION: 03/17/2022: Light touch: Impaired  and very sensitive at top of foot  EDEMA:  03/17/22: Circumferential: knee joint line: Rt: 52.5 cm; Lt: 51 cm  LOWER EXTREMITY ROM:  Active ROM Right 03/17/2022 Left 03/17/2022 Right 03/19/2022 Right 03/26/2022 Right 04/05/2022  Knee flexion 85   101 AROM in supine heel slide AROM 110 in supine heel slide  Knee extension -25 (seated LAQ)   -14 in seated LAQ AROM  -5 in seated LAQ AROM   (Blank rows = not tested)  LOWER EXTREMITY ROM:  Passive ROM Right 03/17/2022 Left 03/17/2022 Right 03/26/2022  Knee flexion  98  105 in supine heel slide overpressure  Knee extension -8    Ankle dorsiflexion      (Blank rows = not tested)  LOWER EXTREMITY MMT:  MMT Right 03/17/2022 Left 03/29/2022 Right 03/29/2022 Right 04/07/2022  Hip flexion      Hip extension      Hip abduction      Hip adduction      Hip internal rotation      Hip external rotation      Knee flexion 3-/5     Knee extension 3-/5 5/5 38.5, 36.9 lbs 3+/5 15.2, 15.3 lbs 4/5 22.3, 23.5 lbs  Ankle dorsiflexion 3-/5     Ankle plantarflexion      Ankle inversion      Ankle eversion       (Blank rows = not tested)  03/17/22: No active toe extension noted  GAIT: 04/07/2022:  independent ambulation within clinic.  Mild reduction in TKE in stance, WB stance time.    TODAY'S TREATMENT: 04/07/2022 Therex:    UBE UE/LE 6  mins lvl 3.0, seat 9   Seated LAQ Rt leg 5 lbs 2 x 15  Incline runner stretch Rt leg posterior 30 sec x 3  Neuro Re-ed  SLS c vector reaching fwd/lateral/back contralateral leg light touch x 8 bilateral   Tandem ambulation in // bars fwd/back 10 ft x 6 each way    TherActivity:  Leg press: Rt single leg 43 lbs 2 x 15 c slow lowering focus , end range pause 2 seconds each way   Lateral step down WB on Rt leg 6 inch x 15   6 inch step up on and down forward WB on Rt x15    Vasopneumatic Rt knee 10 mins medium compression in elevation 34 degrees  04/05/2022 Therex:    Recumbent bike lvl 2 7 mins - seat 7  Incline board runner stretch Rt leg 30 sec x 3  Supine AROM heel slide Rt leg x5 5 sec hold   Neuro Re-ed  SLS c vector reaching fwd/lateral/back contralateral leg light touch x 8 bilateral   Tandem stance on foam 1 min x 2 bilateral occasional hand assist required on bar   TherActivity:  Leg press: Rt single leg 37 lbs 2 x 15 c slow lowering focus , end range pause 2 seconds each way   6 inch step up on and down forward WB on Rt 2 x 10     Vasopneumatic Rt knee 10 mins medium compression in elevation 34  degrees  03/31/2022 Therex:    Recumbent bike lvl 1 6 mins   Seated LAQ 5 lbs c pause in end range flexion/extension 2 x 10  Seated SLR Rt 2 x 10   Seated quad set review for home use  Review of HEP updates c cues for techniques.    TherActivity:  Leg press: Rt single leg 31 lbs 2 x 15 c slow lowering focus    4 inch lateral step down WB on Rt x 15  Sit to stand to sit transfer 20 inch without UE x 10 (cues for home use with slow lowering)  Manual  Seated Rt knee flexion c distraction/IR mobilizations.  Seated contract/relax to Rt quad for flexion mobility gains.     Vasopneumatic Rt knee 10 mins medium compression in elevation 34 degrees   PATIENT EDUCATION:  03/31/2022 Education details: HEP progression  Person educated: Patient Education method: Consulting civil engineer, Demonstration, and Handouts Education comprehension: verbalized understanding, returned demonstration, and needs further education   HOME EXERCISE PROGRAM: Access Code: [email or web address] URL: [email or web address] Date: 03/31/2022 Prepared by: Scot Jun  Exercises - Seated Knee Extension AROM  - 5-10 x daily - 7 x weekly - 1 sets - 5-10 reps - 5 sec hold - Seated Knee Flexion AAROM  - 5-10 x daily - 7 x weekly - 1 sets - 5-10 reps - 10 sec hold - Seated Ankle Pumps on Table  - 2 x daily - 7 x weekly - 1 sets - 10 reps - Seated Quad Set  - 3-5 x daily - 7 x weekly - 1 sets - 10 reps - 5 hold - Seated Straight Leg Heel Taps  - 1-2 x daily - 7 x weekly - 3 sets - 10 reps - Sit to Stand  - 1 x daily - 7 x weekly - 3 sets - 10 reps - Gastroc Stretch on Wall  - 1-2 x daily - 7 x weekly - 1 sets - 3-5 reps - 15 hold  ASSESSMENT:  CLINICAL IMPRESSION:  Pt has attended 8 visits overall during course of treatment.  See objective data for updated data showing progression in mobility/strength as well as functional ability.  Continued Rt leg symptoms (knee, foot) with mobility and strength deficits still present that  can impair functional ability.  Continued skilled PT services indicated at this time.  Due to improvements, some reduction of frequency of in clinic visits can be done.   OBJECTIVE IMPAIRMENTS Abnormal gait, decreased activity tolerance, decreased balance, decreased mobility, decreased ROM, decreased strength, increased edema, increased fascial restrictions, impaired flexibility, and pain.   ACTIVITY LIMITATIONS lifting, bending, sitting, standing, squatting, sleeping, stairs, transfers, bed mobility, and locomotion level  PARTICIPATION LIMITATIONS: cleaning, laundry, driving, community activity, and yard work  PERSONAL FACTORS 3+ comorbidities: breast cancer, OSA, a fib, and L THA.  and complication of Rt foot drop  are also affecting patient's functional outcome.   REHAB POTENTIAL: Good  CLINICAL DECISION MAKING: Evolving/moderate complexity  EVALUATION COMPLEXITY: Moderate   GOALS: Goals reviewed with patient? Yes  SHORT TERM GOALS: Target date: 04/14/2022  Independent with initial HEP Goal status: MET - 04/07/2022  2.  Rt knee AROM 0-10-100 for improved mobility and function. Goal status: MET - 04/07/2022  LONG TERM GOALS: Target date: 05/12/2022  Independent with final HEP Goal status: on going - assessed 04/07/2022  2.  FOTO score improved to 70 Goal status: on going - assessed 04/07/2022  3.  Rt knee AROM improved to 0-110 for improved mobility and function Goal status: on going - assessed 04/07/2022  4.  Report pain < 2/10 with daily functional activity for improved function Goal status: on going - assessed 04/07/2022  5.  Amb with LRAD with minimal gait deviations for improved functional mobility Goal status: MET- assessed 04/07/2022  6.  Demonstrate 4/5 dorsiflexion strength for improved function and ability to return to driving Goal status: on going - assessed 04/07/2022   PLAN: PT FREQUENCY: 2x/week  PT DURATION: 8 weeks  PLANNED INTERVENTIONS: Therapeutic  exercises, Therapeutic activity, Neuromuscular re-education, Balance training, Gait training, Patient/Family education, Joint mobilization, Stair training, Dry Needling, Electrical stimulation, Cryotherapy, Moist heat, scar mobilization, Taping, Vasopneumatic device, Manual therapy, and Re-evaluation  PLAN FOR NEXT SESSION:   Progressive strengthening, end range mobility improvements.  Dynamic balance control progression as able.  May soon begin transition to 1x-2x/week flexibility pending presentation and patient preference.    Scot Jun, PT, DPT, OCS, ATC 04/07/22  10:06 AM

## 2022-04-08 ENCOUNTER — Encounter: Payer: Self-pay | Admitting: Orthopaedic Surgery

## 2022-04-08 ENCOUNTER — Ambulatory Visit (INDEPENDENT_AMBULATORY_CARE_PROVIDER_SITE_OTHER): Payer: Medicare Other | Admitting: Orthopaedic Surgery

## 2022-04-08 DIAGNOSIS — Z96651 Presence of right artificial knee joint: Secondary | ICD-10-CM

## 2022-04-08 NOTE — Progress Notes (Signed)
The patient is now 6 weeks status post a right total knee arthroplasty.  Her incision is healed nicely.  She is back to driving.  She has much improved range of motion of the right knee with almost full extension to pretty good flexion.  There is still peripheral edema.  She does wear compressive hose.  She does not show evidence of true foot drop in terms of the foot and ankle itself but her toes are still having problems getting extension.  This should improve with time because already she is much stronger with her right foot dorsiflexion.  The knee does feel ligamentously stable.  She can swing from my standpoint she will get back to full activities as comfort allows.  She will still work on range of motion of the knee.  She should expect swelling to be in place and hopefully slowly go down as time allows.  I would like to see her back in 3 months.  At that visit we will have a standing AP and lateral of her right operative knee.  All question concerns were answered and addressed.

## 2022-04-12 ENCOUNTER — Encounter: Payer: Self-pay | Admitting: Physical Therapy

## 2022-04-12 ENCOUNTER — Ambulatory Visit (INDEPENDENT_AMBULATORY_CARE_PROVIDER_SITE_OTHER): Payer: Medicare Other | Admitting: Physical Therapy

## 2022-04-12 DIAGNOSIS — R2689 Other abnormalities of gait and mobility: Secondary | ICD-10-CM | POA: Diagnosis not present

## 2022-04-12 DIAGNOSIS — R600 Localized edema: Secondary | ICD-10-CM | POA: Diagnosis not present

## 2022-04-12 DIAGNOSIS — M25561 Pain in right knee: Secondary | ICD-10-CM | POA: Diagnosis not present

## 2022-04-12 DIAGNOSIS — M6281 Muscle weakness (generalized): Secondary | ICD-10-CM

## 2022-04-12 DIAGNOSIS — M25661 Stiffness of right knee, not elsewhere classified: Secondary | ICD-10-CM

## 2022-04-12 NOTE — Therapy (Signed)
OUTPATIENT PHYSICAL THERAPY TREATMENT DISCHARGE SUMMARY   Patient Name: Linda Foster MRN: [id number] DOB:08-14-1956, 66 y.o., female Today's Date: 04/12/2022        PT End of Session - 04/12/22 0932     Visit Number 9    Number of Visits 16    Date for PT Re-Evaluation 05/12/22    Authorization Type Medicare and BCBS    Progress Note Due on Visit 18   updated to reflect prior progress note   PT Start Time 0930    PT Stop Time 1005    PT Time Calculation (min) 35 min    Activity Tolerance Patient tolerated treatment well    Behavior During Therapy [facility] for tasks assessed/performed               Past Medical History:  Diagnosis Date   Breast cancer ([place]) 2008   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   Breast cancer ([place]) 2019   left ER/PR +,  XRT and chemotherapy   Dysrhythmia    Hx of A.Fib   Family history of adverse reaction to anesthesia    N&V   Family history of prostate cancer    Lymphedema 2013   OSA (obstructive sleep apnea)    uses a dental appliance   Osteoarthritis    Persistent atrial fibrillation (HCC)    History of Afib   PONV (postoperative nausea and vomiting)    Past Surgical History:  Procedure Laterality Date   ABDOMINAL HYSTERECTOMY     ATRIAL FIBRILLATION ABLATION N/A 03/03/2021   Procedure: ATRIAL FIBRILLATION ABLATION;  Surgeon: Thompson Grayer, MD;  Location: [place] [facility];  Service: Cardiovascular;  Laterality: N/A;   BREAST LUMPECTOMY Left 2008   BREAST LUMPECTOMY WITH RADIOACTIVE SEED LOCALIZATION Left 12/05/2017   Procedure: BREAST LUMPECTOMY WITH RADIOACTIVE SEED LOCALIZATION;  Surgeon: Rolm Bookbinder, MD;  Location: [place];  Service: General;  Laterality: Left;   BREAST RECONSTRUCTION Left 2019   no BP on Lt arm   CARDIOVERSION N/A 12/01/2020   Procedure: CARDIOVERSION;  Surgeon: Larey Dresser, MD;  Location: [facility];  Service: Cardiovascular;  Laterality: N/A;   CESAREAN SECTION      x 3   KNEE  ARTHROSCOPY Right 2007   TEE WITHOUT CARDIOVERSION N/A 12/01/2020   Procedure: TRANSESOPHAGEAL ECHOCARDIOGRAM (TEE);  Surgeon: Larey Dresser, MD;  Location: [facility] ENDOSCOPY;  Service: Cardiovascular;  Laterality: N/A;   TOTAL HIP ARTHROPLASTY Left 06/15/2019   Procedure: LEFT TOTAL HIP ARTHROPLASTY ANTERIOR APPROACH;  Surgeon: Mcarthur Rossetti, MD;  Location: [facility];  Service: Orthopedics;  Laterality: Left;   TOTAL KNEE ARTHROPLASTY Right 02/25/2022   Procedure: RIGHT TOTAL KNEE ARTHROPLASTY;  Surgeon: Mcarthur Rossetti, MD;  Location: [place];  Service: Orthopedics;  Laterality: Right;   Patient Active Problem List   Diagnosis Date Noted   Unilateral primary osteoarthritis, right knee 02/25/2022   Status post right knee replacement 02/25/2022   Paroxysmal atrial fibrillation ([place]) 11/25/2020   Unilateral primary osteoarthritis, left hip 06/15/2019   Status post total replacement of left hip 06/15/2019   OSA (obstructive sleep apnea) 02/13/2018   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 01/03/2018   Family history of prostate cancer 01/02/2018   Genetic testing 12/15/2017   Primary malignant neoplasm of lower outer quadrant of female breast, left ([place]) 12/14/2017   History of therapeutic radiation 11/01/2017   Ductal carcinoma in situ (DCIS) of left breast 10/24/2017   Chest pain with moderate risk  of acute coronary syndrome 11/03/2016   Atrial fibrillation with rapid ventricular response ([place]) 11/02/2016   History of invasive breast cancer 11/06/2012    PCP: Carol Ada, MD  REFERRING PROVIDER: Mcarthur Rossetti, MD   REFERRING DIAG: [phone] (ICD-10-CM) - Status post right knee replacement   THERAPY DIAG:  Acute pain of right knee  Stiffness of right knee, not elsewhere classified  Localized edema  Other abnormalities of gait and mobility  Muscle weakness (generalized)  Rationale for Evaluation and Treatment Rehabilitation  ONSET  DATE: 02/25/2022  SUBJECTIVE:   SUBJECTIVE STATEMENT: Knee is doing well; no pain; just states "it feels weird."  PERTINENT HISTORY: breast cancer, OSA, a fib, and Lt THA.   PAIN:   NPRS scale: 0/10 upon arrival (knee), at worst in last week 3/10 (knee) Pain location: Rt knee, Rt foot Pain description: soreness, aching Aggravating factors: discomfort with lying down, nighttime.  Relieving factors: medication  PRECAUTIONS: None  WEIGHT BEARING RESTRICTIONS No  FALLS:  Has patient fallen in last 6 months? No  LIVING ENVIRONMENT: Lives with: lives with their spouse Lives in: House/apartment Stairs: Yes: External: 2 steps; on right going up Has following equipment at home: Single point cane, Walker - 2 wheeled, shower chair, and bed side commode  OCCUPATION: retired  PLOF: Independent and Leisure: spend time with grandchildren, scrap booking, limited exercise, has a pool at home as well  PATIENT GOALS normal walking, regain ROM, improve foot/toe weakness   OBJECTIVE:   PATIENT SURVEYS:  04/12/22: FOTO 77 04/07/2022: FOTO update: 67 03/17/22: FOTO 65 (predicted 70)  COGNITION: 03/17/2022: Overall cognitive status: Within functional limits for tasks assessed     SENSATION: 03/17/2022: Light touch: Impaired  and very sensitive at top of foot  EDEMA:  03/17/22: Circumferential: knee joint line: Rt: 52.5 cm; Lt: 51 cm  LOWER EXTREMITY ROM:  Active ROM Right 03/17/2022 Left 03/17/2022 Right 03/19/2022 Right 03/26/2022 Right 04/05/2022 Right 04/12/22  Knee flexion 85   101 AROM in supine heel slide AROM 110 in supine heel slide AROM 110  Knee extension -25 (seated LAQ)   -14 in seated LAQ AROM  -5 in seated LAQ AROM -2 in seated LAQ   (Blank rows = not tested)  LOWER EXTREMITY ROM:  Passive ROM Right 03/17/2022 Left 03/17/2022 Right 03/26/2022  Knee flexion 98  105 in supine heel slide overpressure  Knee extension -8    Ankle dorsiflexion      (Blank rows = not tested)  LOWER  EXTREMITY MMT:  MMT Right 03/17/2022 Left 03/29/2022 Right 03/29/2022 Right 04/07/2022  Knee flexion 3-/5     Knee extension 3-/5 5/5 38.5, 36.9 lbs 3+/5 15.2, 15.3 lbs 4/5 22.3, 23.5 lbs  Ankle dorsiflexion 3-/5      (Blank rows = not tested)  03/17/22: No active toe extension noted  GAIT: 04/07/2022:  independent ambulation within clinic.  Mild reduction in TKE in stance, WB stance time.    TODAY'S TREATMENT: 04/12/22 Therex: UBE UE/LE 6 mins lvl 3.0, seat 9  Knee extension machine 5# RLE only x 10 reps, then min A from LLE x 10 reps Knee flexion RLE only 10# 2x10 Discussed aquatic exercises with pt, she verbalized understanding of options ROM measurements as noted above  04/07/2022 Therex:    UBE UE/LE 6 mins lvl 3.0, seat 9   Seated LAQ Rt leg 5 lbs 2 x 15  Incline runner stretch Rt leg posterior 30 sec x 3  Neuro Re-ed  SLS c vector  reaching fwd/lateral/back contralateral leg light touch x 8 bilateral   Tandem ambulation in // bars fwd/back 10 ft x 6 each way    TherActivity:  Leg press: Rt single leg 43 lbs 2 x 15 c slow lowering focus , end range pause 2 seconds each way   Lateral step down WB on Rt leg 6 inch x 15   6 inch step up on and down forward WB on Rt x15    Vasopneumatic Rt knee 10 mins medium compression in elevation 34 degrees  04/05/2022 Therex:    Recumbent bike lvl 2 7 mins - seat 7  Incline board runner stretch Rt leg 30 sec x 3  Supine AROM heel slide Rt leg x5 5 sec hold   Neuro Re-ed  SLS c vector reaching fwd/lateral/back contralateral leg light touch x 8 bilateral   Tandem stance on foam 1 min x 2 bilateral occasional hand assist required on bar   TherActivity:  Leg press: Rt single leg 37 lbs 2 x 15 c slow lowering focus , end range pause 2 seconds each way   6 inch step up on and down forward WB on Rt 2 x 10     Vasopneumatic Rt knee 10 mins medium compression in elevation 34 degrees   PATIENT EDUCATION:  03/31/2022 Education  details: HEP progression  Person educated: Patient Education method: Consulting civil engineer, Media planner, and Handouts Education comprehension: verbalized understanding, returned demonstration, and needs further education   HOME EXERCISE PROGRAM: Access Code: [email or web address] URL: [email or web address] Date: 03/31/2022 Prepared by: Scot Jun  Exercises - Seated Knee Extension AROM  - 5-10 x daily - 7 x weekly - 1 sets - 5-10 reps - 5 sec hold - Seated Knee Flexion AAROM  - 5-10 x daily - 7 x weekly - 1 sets - 5-10 reps - 10 sec hold - Seated Ankle Pumps on Table  - 2 x daily - 7 x weekly - 1 sets - 10 reps - Seated Quad Set  - 3-5 x daily - 7 x weekly - 1 sets - 10 reps - 5 hold - Seated Straight Leg Heel Taps  - 1-2 x daily - 7 x weekly - 3 sets - 10 reps - Sit to Stand  - 1 x daily - 7 x weekly - 3 sets - 10 reps - Gastroc Stretch on Wall  - 1-2 x daily - 7 x weekly - 1 sets - 3-5 reps - 15 hold  ASSESSMENT:  CLINICAL IMPRESSION: Pt has met/partially met all goals at this time except ankle strength which is still limited.  This will likely take time due to nerve damage.  Will d/c PT today as pt is pleased with current progress.     OBJECTIVE IMPAIRMENTS Abnormal gait, decreased activity tolerance, decreased balance, decreased mobility, decreased ROM, decreased strength, increased edema, increased fascial restrictions, impaired flexibility, and pain.   ACTIVITY LIMITATIONS lifting, bending, sitting, standing, squatting, sleeping, stairs, transfers, bed mobility, and locomotion level  PARTICIPATION LIMITATIONS: cleaning, laundry, driving, community activity, and yard work  PERSONAL FACTORS 3+ comorbidities: breast cancer, OSA, a fib, and L THA.  and complication of Rt foot drop  are also affecting patient's functional outcome.   REHAB POTENTIAL: Good  CLINICAL DECISION MAKING: Evolving/moderate complexity  EVALUATION COMPLEXITY: Moderate   GOALS: Goals reviewed with  patient? Yes  SHORT TERM GOALS: Target date: 04/14/2022  Independent with initial HEP Goal status: MET - 04/07/2022  2.  Rt knee AROM 0-10-100 for improved mobility and  function. Goal status: MET - 04/07/2022  LONG TERM GOALS: Target date: 05/12/2022  Independent with final HEP Goal status: MET 04/12/22  2.  FOTO score improved to 70 /Goal status: MET 04/12/22  3.  Rt knee AROM improved to 0-110 for improved mobility and function Goal status: partially met 04/12/22  4.  Report pain < 2/10 with daily functional activity for improved function Goal status: on going - assessed 04/07/2022  5.  Amb with LRAD with minimal gait deviations for improved functional mobility Goal status: MET- assessed 04/07/2022  6.  Demonstrate 4/5 dorsiflexion strength for improved function and ability to return to driving Goal status: not met 04/12/22   PLAN: PT FREQUENCY: 2x/week  PT DURATION: 8 weeks  PLANNED INTERVENTIONS: Therapeutic exercises, Therapeutic activity, Neuromuscular re-education, Balance training, Gait training, Patient/Family education, Joint mobilization, Stair training, Dry Needling, Electrical stimulation, Cryotherapy, Moist heat, scar mobilization, Taping, Vasopneumatic device, Manual therapy, and Re-evaluation  PLAN FOR NEXT SESSION:   d/c PT today   Laureen Abrahams, PT, DPT 04/12/22 10:10 AM     PHYSICAL THERAPY DISCHARGE SUMMARY  Visits from Start of Care: 9  Current functional level related to goals / functional outcomes: See above   Remaining deficits: See above   Education / Equipment: HEP   Patient agrees to discharge. Patient goals were partially met. Patient is being discharged due to being pleased with the current functional level.  Laureen Abrahams, PT, DPT 04/12/22 10:10 AM  [facility] [address] [place], [place], [address] Phone: [phone]   Fax:  [phone]

## 2022-04-14 ENCOUNTER — Ambulatory Visit (HOSPITAL_COMMUNITY): Payer: Medicare Other | Admitting: Physician Assistant

## 2022-04-14 ENCOUNTER — Encounter: Payer: Medicare Other | Admitting: Rehabilitative and Restorative Service Providers"

## 2022-04-15 ENCOUNTER — Ambulatory Visit (HOSPITAL_COMMUNITY)
Admission: RE | Admit: 2022-04-15 | Discharge: 2022-04-15 | Disposition: A | Discharge status: Home / Self Care | Payer: Medicare Other | Source: Ambulatory Visit | Attending: Physician Assistant | Admitting: Physician Assistant

## 2022-04-15 ENCOUNTER — Encounter (HOSPITAL_COMMUNITY): Payer: Self-pay | Admitting: Physician Assistant

## 2022-04-15 VITALS — BP 124/84 | HR 71 | Ht 65.0 in | Wt 205.8 lb

## 2022-04-15 DIAGNOSIS — I5022 Chronic systolic (congestive) heart failure: Secondary | ICD-10-CM | POA: Diagnosis not present

## 2022-04-15 DIAGNOSIS — Z79899 Other long term (current) drug therapy: Secondary | ICD-10-CM | POA: Diagnosis not present

## 2022-04-15 DIAGNOSIS — I480 Paroxysmal atrial fibrillation: Secondary | ICD-10-CM | POA: Diagnosis present

## 2022-04-15 DIAGNOSIS — G4733 Obstructive sleep apnea (adult) (pediatric): Secondary | ICD-10-CM | POA: Diagnosis not present

## 2022-04-15 MED ORDER — METOPROLOL TARTRATE 50 MG PO TABS: 25.0000 mg | ORAL_TABLET | Freq: Two times a day (BID) | ORAL | 3 refills | Status: DC

## 2022-04-15 NOTE — Progress Notes (Signed)
Primary Care Physician: Carol Ada, MD Referring Physician: Dr. Rayann Heman( previous Dr. Caryl Comes)   Linda Foster is a 66 y.o. female with a h/o paroxysmal afib that was seen  in the afib clinic early fall  for afib being found at time of preop for hip surgery .  BB was restarted and she was in SR today when seen in the clinic.  She went on to have her hip surgery and did well without any issues with afib.  She has felt a few flutters since  then but short lived. Not on anticoagulation with CHA2DS2VASc score of 1 for female.  F/u in afib clinic, 05/28/20. Pt is here as she had irregular heart beat that started Tuesday pm after returning from  the beach. She considered taking extra metoprolol but did not. She is not on anticoagulation as she has a CHA2DS2VASc score of 1. She feels better today and EKG shows SR. No alcohol, heavy caffeine use, no tobacco use.   Follow up in the AF clinic 11/25/20. Patient reports that around 3:30 AM today, she felt much more SOB and she checked her heart rate which was around 160 bpm. There were no specific triggers that she could identify. She is not currently on anticoagulation.   F/u in afib clnic, 12/05/20. She  had a successful cardioversion  3/21 but returned to afib this am. HR was 150 bpm before her meds this am now atrial flutter at 125 bpm. She is currently on 75 mg metoprolol bid. We discussed options going forward to manage afib. She continues on eliquis 5 mg bid for a CHA2DS2VASc score of 1.   F/u in afib clinic, 7/19. She is one month s/p ablation and has not noted any afib. No swallowing or groin issues. Continues to feel slightly winded.   Follow up in the AF clinic 04/15/22. Patient reports that she has done well since her last visit. She has not had any episodes of afib. She had knee surgery about 6 weeks ago and is recovering well.   Today, she denies symptoms of palpitations, chest pain, orthopnea, PND, lower extremity edema, dizziness, presyncope,  syncope, or neurologic sequela. The patient is tolerating medications without difficulties and is otherwise without complaint today.   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   Breast cancer ([place]) 2019   left ER/PR +,  XRT and chemotherapy   Dysrhythmia    Hx of A.Fib   Family history of adverse reaction to anesthesia    N&V   Family history of prostate cancer    Lymphedema 2013   OSA (obstructive sleep apnea)    uses a dental appliance   Osteoarthritis    Persistent atrial fibrillation (HCC)    History of Afib   PONV (postoperative nausea and vomiting)    Past Surgical History:  Procedure Laterality Date   ABDOMINAL HYSTERECTOMY     ATRIAL FIBRILLATION ABLATION N/A 03/03/2021   Procedure: ATRIAL FIBRILLATION ABLATION;  Surgeon: Thompson Grayer, MD;  Location: [place] [facility];  Service: Cardiovascular;  Laterality: N/A;   BREAST LUMPECTOMY Left 2008   BREAST LUMPECTOMY WITH RADIOACTIVE SEED LOCALIZATION Left 12/05/2017   Procedure: BREAST LUMPECTOMY WITH RADIOACTIVE SEED LOCALIZATION;  Surgeon: Rolm Bookbinder, MD;  Location: [place];  Service: General;  Laterality: Left;   BREAST RECONSTRUCTION Left 2019   no BP on Lt arm   CARDIOVERSION N/A 12/01/2020   Procedure:  CARDIOVERSION;  Surgeon: Larey Dresser, MD;  Location: [facility] ENDOSCOPY;  Service: Cardiovascular;  Laterality: N/A;   CESAREAN SECTION      x 3   KNEE ARTHROSCOPY Right 2007   TEE WITHOUT CARDIOVERSION N/A 12/01/2020   Procedure: TRANSESOPHAGEAL ECHOCARDIOGRAM (TEE);  Surgeon: Larey Dresser, MD;  Location: [facility] ENDOSCOPY;  Service: Cardiovascular;  Laterality: N/A;   TOTAL HIP ARTHROPLASTY Left 06/15/2019   Procedure: LEFT TOTAL HIP ARTHROPLASTY ANTERIOR APPROACH;  Surgeon: Mcarthur Rossetti, MD;  Location: [facility];  Service: Orthopedics;  Laterality: Left;   TOTAL KNEE ARTHROPLASTY Right 02/25/2022   Procedure: RIGHT TOTAL KNEE  ARTHROPLASTY;  Surgeon: Mcarthur Rossetti, MD;  Location: [place];  Service: Orthopedics;  Laterality: Right;    Current Outpatient Medications  Medication Sig Dispense Refill   anastrozole (ARIMIDEX) 1 MG tablet Take 1 tablet (1 mg total) by mouth daily. 90 tablet 2   APPLE CIDER VINEGAR PO Take 2-3 tablets by mouth daily.     b complex vitamins capsule Take 1 capsule by mouth daily.     cholecalciferol (VITAMIN D3) 25 MCG (1000 UNIT) tablet Take 1,000 Units by mouth daily.     furosemide (LASIX) 40 MG tablet Take 1 tablet (40 mg total) by mouth daily. 90 tablet 3   Glucosamine HCl (GLUCOSAMINE PO) Take 2 tablets by mouth every morning.     methocarbamol (ROBAXIN) 500 MG tablet Take 1 tablet (500 mg total) by mouth every 6 (six) hours as needed for muscle spasms. 30 tablet 1   metoprolol tartrate (LOPRESSOR) 50 MG tablet Take 1 tablet (50 mg total) by mouth 2 (two) times daily. 180 tablet 3   Multiple Vitamin tablet Take 2 tablets by mouth daily.     naproxen (NAPRELAN) 500 MG 24 hr tablet Take 500 mg by mouth daily with breakfast.     ondansetron (ZOFRAN-ODT) 4 MG disintegrating tablet Take 1 tablet (4 mg total) by mouth every 8 (eight) hours as needed for nausea or vomiting. 20 tablet 0   oxyCODONE (OXY IR/ROXICODONE) 5 MG immediate release tablet Take 1 tablet (5 mg total) by mouth every 6 (six) hours as needed for moderate pain (pain score 4-6). 30 tablet 0   No current facility-administered medications for this encounter.    No Known Allergies  Social History   Socioeconomic History   Marital status: Married    Spouse name: Not on file   Number of children: Not on file   Years of education: Not on file   Highest education level: Not on file  Occupational History   Not on file  Tobacco Use   Smoking status: Never   Smokeless tobacco: Never  Vaping Use   Vaping Use: Never used  Substance and Sexual Activity   Alcohol use: No   Drug use: No   Sexual activity: Not on  file  Other Topics Concern   Not on file  Social History Narrative   Not on file   Social Determinants of Health   Financial Resource Strain: Not on file  Food Insecurity: Not on file  Transportation Needs: Not on file  Physical Activity: Not on file  Stress: Not on file  Social Connections: Not on file  Intimate Partner Violence: Not on file    Family History  Problem Relation Age of Onset   Hypertension Mother    COPD Father    Prostate cancer Father 24   Lung cancer Brother 21   Prostate cancer Brother 58  Cancer Cousin        type unk dx. >50   Cancer Cousin 23       type unk   Cancer Cousin        type and age dx unkown    ROS- All systems are reviewed and negative except as per the HPI above  Physical Exam: Vitals:   04/15/22 0811  Height: '5\' 5"'$  (1.651 m)   Wt Readings from Last 3 Encounters:  02/25/22 97.1 kg  02/18/22 97.5 kg  09/17/21 97.1 kg    Labs: Lab Results  Component Value Date   NA 138 02/26/2022   K 4.8 02/26/2022   CL 104 02/26/2022   CO2 26 02/26/2022   GLUCOSE 147 (H) 02/26/2022   BUN 12 02/26/2022   CREATININE 0.71 02/26/2022   CALCIUM 9.1 02/26/2022   MG 2.2 11/02/2016   Lab Results  Component Value Date   INR 1.02 11/03/2016   Lab Results  Component Value Date   CHOL 232 (H) 11/03/2016   HDL 74 11/03/2016   LDLCALC 145 (H) 11/03/2016   TRIG 63 11/03/2016    GEN- The patient is a well appearing obese female, alert and oriented x 3 today.   HEENT-head normocephalic, atraumatic, sclera clear, conjunctiva pink, hearing intact, trachea midline. Lungs- Clear to ausculation bilaterally, normal work of breathing Heart- Regular rate and rhythm, no murmurs, rubs or gallops  GI- soft, NT, ND, + BS Extremities- no clubbing, cyanosis, or edema MS- no significant deformity or atrophy Skin- no rash or lesion Psych- euthymic mood, full affect Neuro- strength and sensation are intact   EKG-  SR Vent. rate 71 BPM PR interval  140 ms QRS duration 84 ms QT/QTcB 404/439 ms  Echo 09/15/21  1. Left ventricular ejection fraction, by estimation, is 60 to 65%. The  left ventricle has normal function. The left ventricle has no regional  wall motion abnormalities. Left ventricular diastolic parameters were  normal.   2. Right ventricular systolic function is normal. The right ventricular  size is normal. There is normal pulmonary artery systolic pressure.   3. Left atrial size was moderately dilated.   4. The mitral valve is normal in structure. Trivial mitral valve  regurgitation. No evidence of mitral stenosis.   5. The aortic valve is tricuspid. There is mild calcification of the  aortic valve. There is mild thickening of the aortic valve. Aortic valve  regurgitation is not visualized. No aortic stenosis is present.   6. The inferior vena cava is normal in size with greater than 50%  respiratory variability, suggesting right atrial pressure of 3 mmHg.   Epic records reviewed   Assessment and Plan: 1. Paroxysmal afib S/p ablation 03/03/21 Patient appears to be maintaining SR.  Will decrease metoprolol to 25 mg BID. If she still has not had any afib on follow up, will consider discontinuing.   2.Chadsvasc score of 2 Not currently on anticoagulation with low CV score.  3. OSA Encouraged compliance with CPAP therapy. Followed by [facility]   4. HFrEF EF recovered with SR, suspected tachycardia mediated.  Fluid status appears stable today.    Follow up in the AF clinic in 6 months.    [facility] [address] [place], [place] [address] [phone]

## 2022-04-19 ENCOUNTER — Encounter: Payer: Medicare Other | Admitting: Physical Therapy

## 2022-04-21 ENCOUNTER — Encounter: Payer: Medicare Other | Admitting: Physical Therapy

## 2022-06-22 IMAGING — DX DG KNEE 1-2V PORT*R*
1 series · 2 of 2 positions shown · non-contrast
Comparison: Right knee radiograph dated January 16, 2020

CLINICAL DATA: Status post right knee replacement

EXAM:
PORTABLE RIGHT KNEE - 2 VIEW

[Series 1: knee · 0.14mm/px · 2 of 2 slices shown]
[im 1/2]
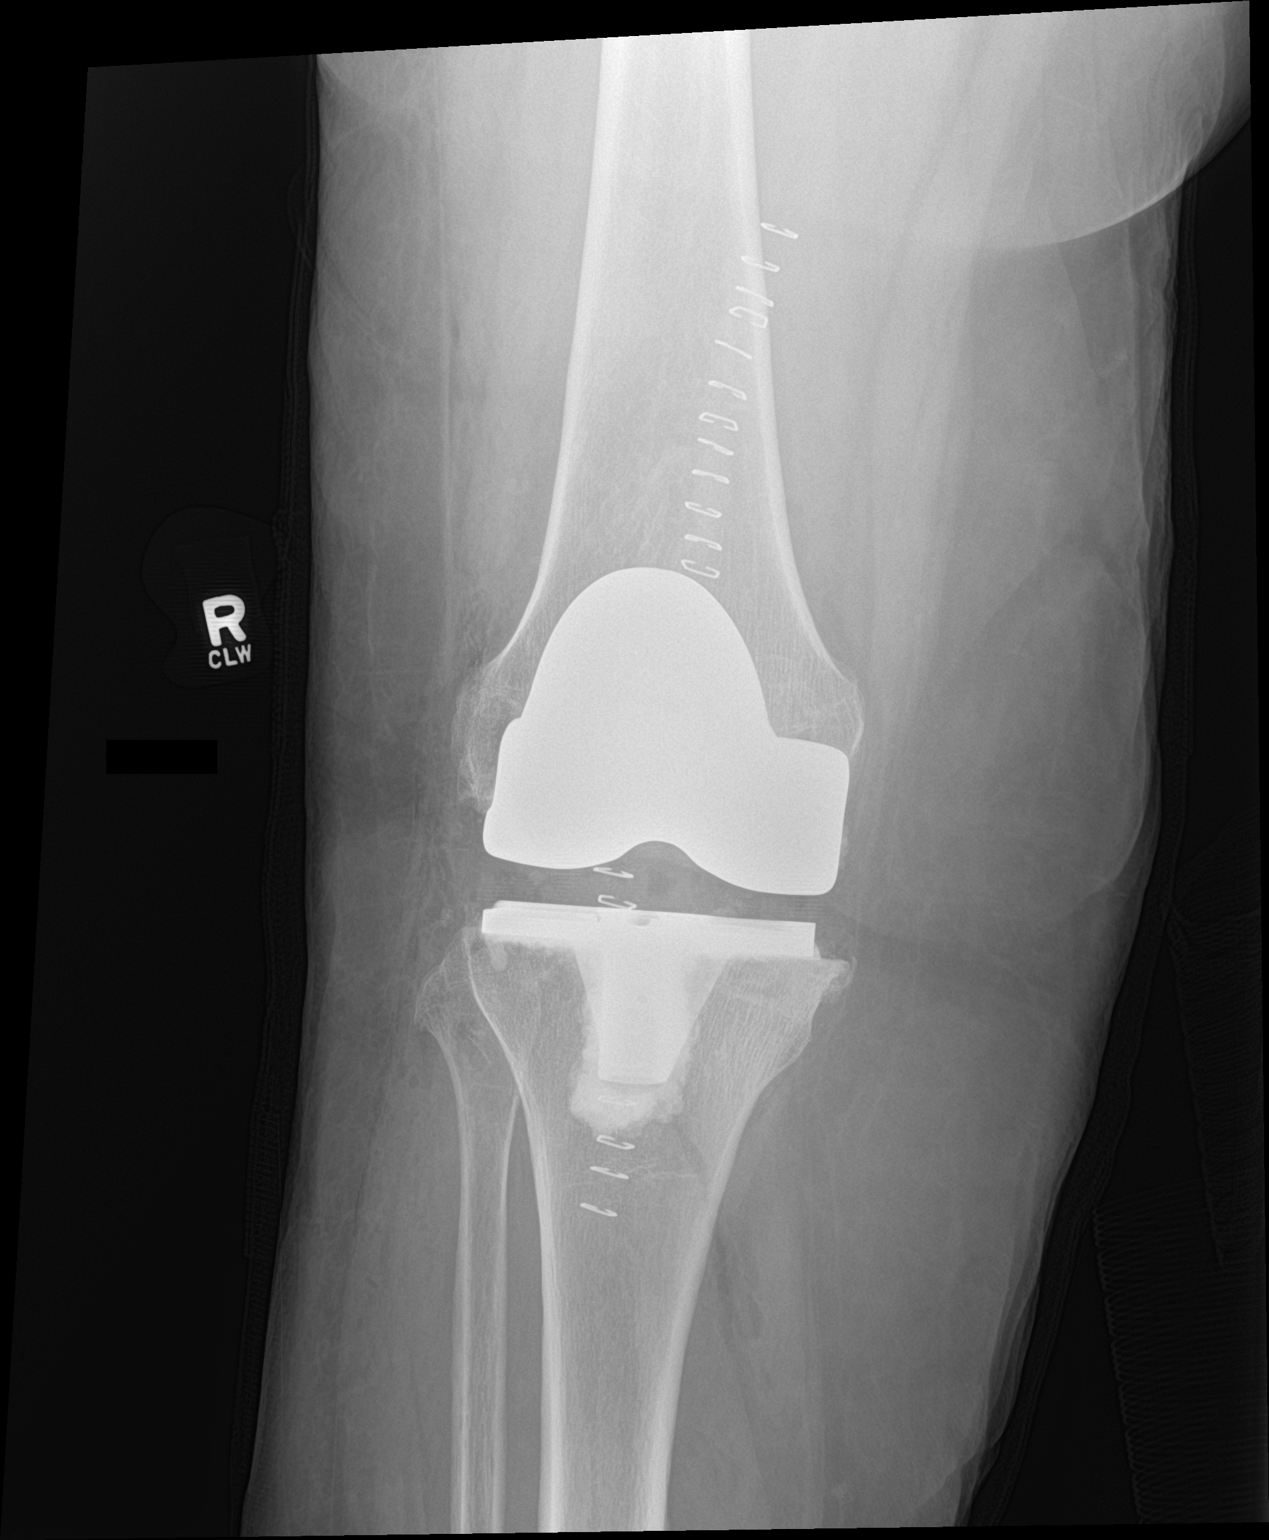
[im 2/2]
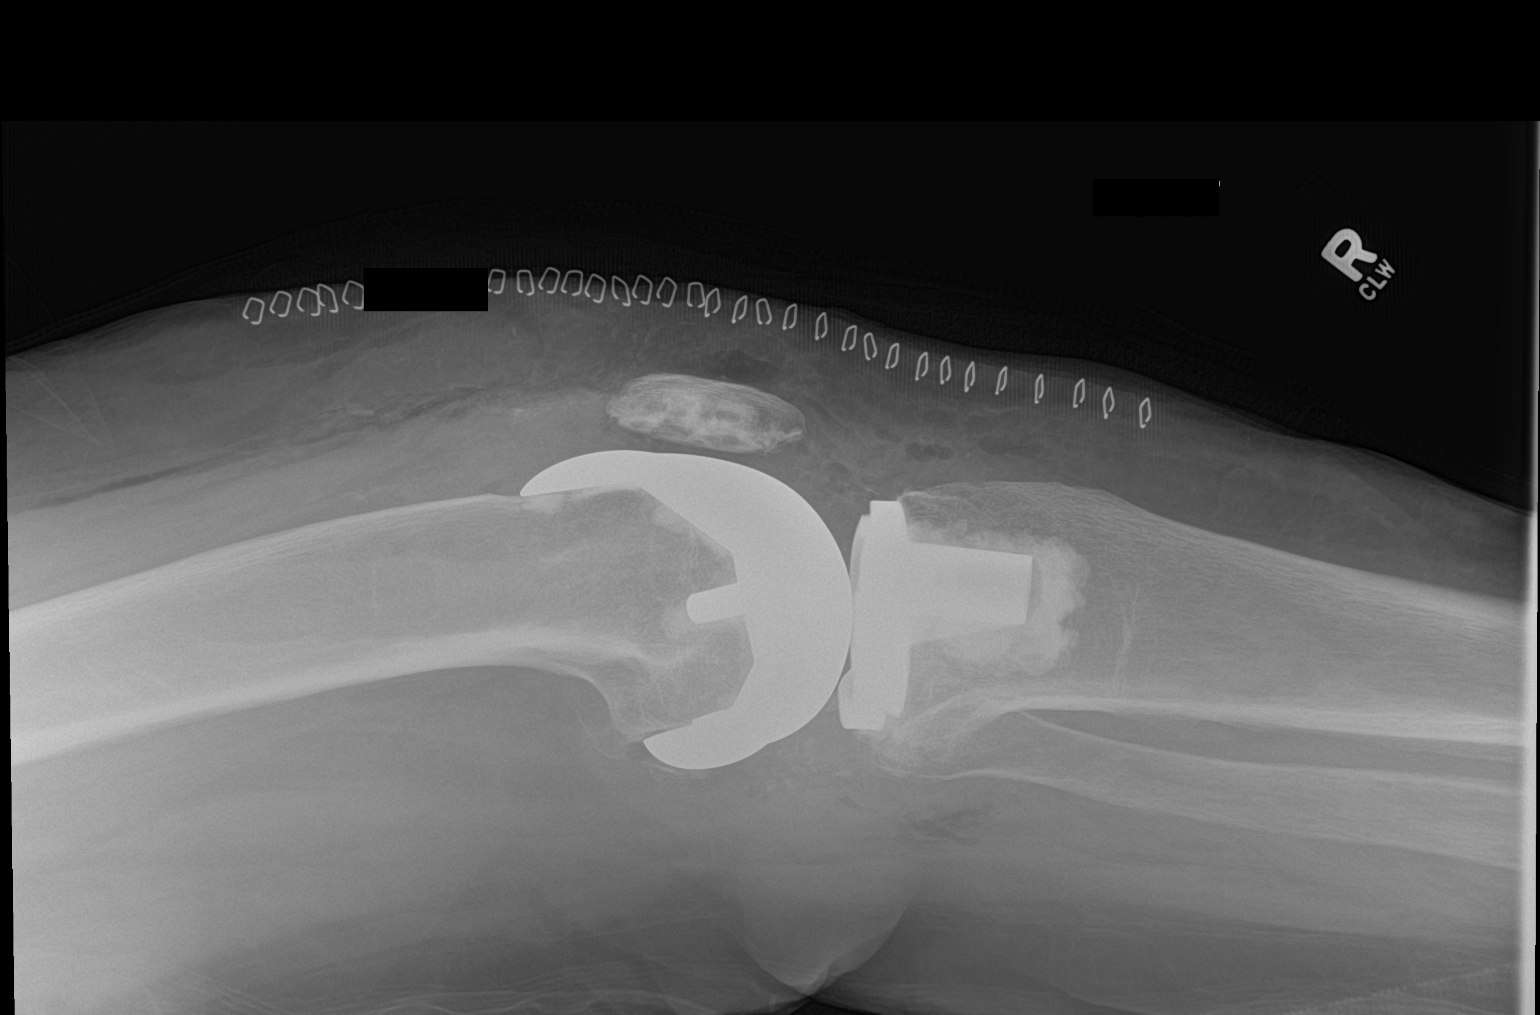

[2 of 2 positions shown; findings below may reference images not displayed]

FINDINGS: Interval postsurgical changes from right total knee arthroplasty.
Arthroplasty components appear in their expected alignment. No
periprosthetic fracture is identified. Expected postoperative
changes within the overlying soft tissues.
IMPRESSION: Postsurgical changes of right total knee arthroplasty.

## 2022-07-01 NOTE — Progress Notes (Signed)
Patient Care Team: Carol Ada, MD as PCP - General (Family Medicine) Aibhlinn Compton, MD as Consulting Physician (Obstetrics and Gynecology) Marlou Sa, Tonna Corner, MD as Consulting Physician (Orthopedic Surgery) Rolm Bookbinder, MD as Consulting Physician (General Surgery) Magrinat, Virgie Dad, MD (Inactive) as Consulting Physician (Oncology) Irene Limbo, MD as Consulting Physician (Plastic Surgery) Jacinto Reap, MD (Plastic Surgery) Belva Crome, MD as Consulting Physician (Cardiology) Belva Crome, MD as Consulting Physician (Cardiology) Mcarthur Rossetti, MD as Consulting Physician (Orthopedic Surgery)  DIAGNOSIS:  Encounter Diagnosis  Name Primary?   Ductal carcinoma in situ (DCIS) of left breast     SUMMARY OF ONCOLOGIC HISTORY: Oncology History Overview Note  Cancer Staging Ductal carcinoma in situ (DCIS) of left breast Staging form: Breast, AJCC 8th Edition - Clinical stage from 10/20/2017: Stage 0 (cTis (DCIS), cN0, cM0, ER: Positive, PR: Positive, HER2: Not assessed ) - Signed by Truitt Merle, MD on 10/25/2017  2008, left breast pathological stage: T2 N1  triple negative invasive ductal carcinoma, grade 3.   History of invasive breast cancer  08/25/2006 Imaging   MR breast bilateral IMPRESSION: 4.9 cm mass within the upper outer quadrant of the left breast, consistent with known malignancy. Enlarged lower axillary lymph node on the left, consistent with known metastatic disease.   08/27/2006 Imaging   PET Scan IMPRESSION: The left breast primary and left axillary adenopathy are both hypermetabolic. No other areas of hypermetabolism suggest metastatic disease. Neither of the two large liver lesions is hypermetabolic.  Further, a left hepatic dome lesion is partially imaged on the breast MR is more consistent with a hemangioma or less likely cyst.  These lesions could be more entirely evaluated with dedicated hepatic MR or reevaluated on follow-up PET.     09/16/2006 - 11/04/2006 Chemotherapy   The patient had 4 cycles of chemotherapy with FEC for chemotherapy treatment.     11/17/2006 Imaging   MR breast bilateral IMPRESSION: No residual enhancement of the left upper outer quadrant breast cancer.  The previously seen left axillary adenopathy has also resolved.   11/18/2006 - 12/02/2006 Chemotherapy   The patient had 2 cycles of chemotherapy with Taxotere for chemotherapy treatment.     12/16/2006 - 01/06/2007 Chemotherapy   The patient had  2 cycles of chemotherapy with Abraxane for chemotherapy treatment.     01/09/2007 Imaging   MR breast bilateral IMPRESSION: No residual enhancement in the upper outer quadrant of the left breast.  Left axillary adenopathy is no longer present.   02/01/2007 Initial Diagnosis   History of left breast cancer   2008 Mammogram   Routine screening mammogram IMPRESSION: Palpable mass 3 cm by lobe with this centimeter focus of calcifications. Ultrasound confirmed the presence of a hypoechoic mass measuring about 5 cm in maximum diameter. There was a 2 cm hypoechoic lymph nodes seen.    02/01/2007 Pathology Results   LEFT BREAST, NEEDLE LOCALIZATION BIOPSY IMPRESSION: DENSE FIBROSIS. NO RESIDUAL TUMOR IDENTIFIED. LYMPH NODE, LEFT AXILLARY, EXCISION: NO TUMOR IDENTIFIED IN TEN LYMPH NODES, SEE COMMENT   2008 Pathology Results    Biopsy: Both breasts and lymph nodes showed metastatic carcinoma. ER/PR positive, Ki-67 52%, HER-2/neu 1+.     03/28/2007 - 04/21/2007 Radiation Therapy   Radiation Therapy treatment   Ductal carcinoma in situ (DCIS) of left breast  10/13/2017 Mammogram   Unilateral left diagnostic mammography with tomography and left breast ultrasonography IMPRESSION: The new grouped calcifications in the left breast are suspicious of malignancy.   10/20/2017 Pathology  Results   Left needle core biopsy with pathology showing: IMPRESSION: Breast, left, needle core biopsy with ductal carcinoma in situ (DCIS),  intermediate nuclear grade, solid and cribriform type with central necrosis and associated microcalcifications. PROGNOSTIC INDICATORS Results: IMMUNOHISTOCHEMICAL AND MORPHOMETRIC ANALYSIS PERFORMED MANUALLY Estrogen Receptor: 95%, POSITIVE, STRONG STAINING INTENSITY Progesterone Receptor: 80%, POSITIVE, STRONG STAINING INTENSITY   10/24/2017 Initial Diagnosis   Ductal carcinoma in situ (DCIS) of left breast   11/26/2017 Genetic Testing   The Common Hereditary Cancer Panel offered by [facility] includes sequencing and/or deletion duplication testing of the following 47 genes: APC, ATM, AXIN2, BARD1, BMPR1A, BRCA1, BRCA2, BRIP1, CDH1, CDKN2A (p14ARF), CDKN2A (p16INK4a), CKD4, CHEK2, CTNNA1, DICER1, EPCAM (Deletion/duplication testing only), GREM1 (promoter region deletion/duplication testing only), KIT, MEN1, MLH1, MSH2, MSH3, MSH6, MUTYH, NBN, NF1, NHTL1, PALB2, PDGFRA, PMS2, POLD1, POLE, PTEN, RAD50, RAD51C, RAD51D, SDHB, SDHC, SDHD, SMAD4, SMARCA4. STK11, TP53, TSC1, TSC2, and VHL.  The following genes were evaluated for sequence changes only: SDHA and HOXB13 c.251G>A variant only.  Results: No pathogenic variants identified.  Variants of uncertain significance were identified in the genes APC c.4088A>G (p.Lys1363Arg) and BRIP1 c.1655T>C (p.Ile552Thr).  These variants should not be used to alter medical management.  The date of this test report is 11/26/2017.      CHIEF COMPLIANT: estrogen receptor positive breast cancer (s/p left mastectomy)    INTERVAL HISTORY: AASTHA Foster is a 66 y.o with the above-mentioned. Currently on anastrozole and surveillance. She presents to the clinic for a follow-up and establish oncology care with Dr. Lindi Adie. She reports that  She is tolerating the anastrozole extremely well. She does have some joint stiffness, She had a right knee replacement so it could be some of the cause of her stiffness.. Denies any pain or discomfort in breast. She does have swelling in  feet.   ALLERGIES:  has No Known Allergies.  MEDICATIONS:  Current Outpatient Medications  Medication Sig Dispense Refill   APPLE CIDER VINEGAR PO Take 2-3 tablets by mouth daily.     b complex vitamins capsule Take 1 capsule by mouth daily.     cholecalciferol (VITAMIN D3) 25 MCG (1000 UNIT) tablet Take 1,000 Units by mouth daily.     furosemide (LASIX) 40 MG tablet Take 1 tablet (40 mg total) by mouth daily. 90 tablet 3   Glucosamine HCl (GLUCOSAMINE PO) Take 2 tablets by mouth every morning.     Multiple Vitamin tablet Take 2 tablets by mouth daily.     naproxen (NAPRELAN) 500 MG 24 hr tablet Take 500 mg by mouth daily with breakfast.     anastrozole (ARIMIDEX) 1 MG tablet Take 1 tablet (1 mg total) by mouth daily. 90 tablet 3   No current facility-administered medications for this visit.    PHYSICAL EXAMINATION: ECOG PERFORMANCE STATUS: 1 - Symptomatic but completely ambulatory  Vitals:   07/05/22 1123  BP: 134/77  Pulse: 88  Resp: 18  Temp: (!) 97.3 F (36.3 C)  SpO2: 100%   Filed Weights   07/05/22 1123  Weight: 210 lb 9.6 oz (95.5 kg)    BREAST: No palpable masses or nodules in either right or left breasts. No palpable axillary supraclavicular or infraclavicular adenopathy no breast tenderness or nipple discharge. (exam performed in the presence of a chaperone)  LABORATORY DATA:  I have reviewed the data as listed    Latest Ref Rng & Units 07/05/2022   11:10 AM 02/26/2022    4:59 AM 02/18/2022    8:31 AM  CMP  Glucose 70 - 99 mg/dL 105  147  103   BUN 8 - 23 mg/dL _0 Creatinine 0.44 - 1.00 mg/dL 0.79  0.71  0.66   Sodium 135 - 145 mmol/L 141  138  142   Potassium 3.5 - 5.1 mmol/L 4.3  4.8  3.9   Chloride 98 - 111 mmol/L 103  104  104   CO2 22 - 32 mmol/L _1 Calcium 8.9 - 10.3 mg/dL 9.9  9.1  9.7   Total Protein 6.5 - 8.1 g/dL 7.2     Total Bilirubin 0.3 - 1.2 mg/dL 0.7     Alkaline Phos 38 - 126 U/L 104     AST 15 - 41 U/L 20     ALT  0 - 44 U/L 15       Lab Results  Component Value Date   WBC 5.3 07/05/2022   HGB 12.7 07/05/2022   HCT 38.4 07/05/2022   MCV 88.7 07/05/2022   PLT 201 07/05/2022   NEUTROABS 2.4 07/05/2022    ASSESSMENT & PLAN:  Ductal carcinoma in situ (DCIS) of left breast Dr. Virgie Dad patient to establish oncology care with me 2008: Left lumpectomy and ALND: T2N3 IDC, triple negative, postchemotherapy and radiation 12/05/2017: Left lumpectomy: T1 a N0 stage Ia IDC ER/PR positive HER2 negative along with DCIS ER/PR positive, genetics: Negative 03/07/2018: Left mastectomy with D IEP reconstruction: No residual cancer  Current treatment: Anastrozole started 12/25/2017 Anastrozole toxicities: 1.  Denies any hot flashes. Denies arthralgias or myalgias.  We discussed the duration of antiestrogen therapy is between 5 to 7 years.  We would like to send for breast cancer index to determine if she would benefit from extended endocrine therapy.  I will call her with the result of this test.  Breast cancer surveillance: 1.  Breast exam 07/05/2022: Benign 2. mammogram right breast at [facility] 07/05/2022: This will be done at a later time today.  Return to clinic in 1 year for follow-up    No orders of the defined types were placed in this encounter.  The patient has a good understanding of the overall plan. she agrees with it. she will call with any problems that may develop before the next visit here. Total time spent: 30 mins including face to face time and time spent for planning, charting and co-ordination of care   Harriette Ohara, MD 07/05/22    I Gardiner Coins am scribing for Dr. Lindi Adie  I have reviewed the above documentation for accuracy and completeness, and I agree with the above.

## 2022-07-02 ENCOUNTER — Other Ambulatory Visit: Payer: Self-pay | Admitting: *Deleted

## 2022-07-02 DIAGNOSIS — C50512 Malignant neoplasm of lower-outer quadrant of left female breast: Secondary | ICD-10-CM

## 2022-07-05 ENCOUNTER — Other Ambulatory Visit: Payer: Self-pay

## 2022-07-05 ENCOUNTER — Inpatient Hospital Stay: Payer: Medicare Other | Attending: Hematology and Oncology

## 2022-07-05 ENCOUNTER — Inpatient Hospital Stay (HOSPITAL_BASED_OUTPATIENT_CLINIC_OR_DEPARTMENT_OTHER): Payer: Medicare Other | Admitting: Hematology and Oncology

## 2022-07-05 DIAGNOSIS — Z79811 Long term (current) use of aromatase inhibitors: Secondary | ICD-10-CM | POA: Insufficient documentation

## 2022-07-05 DIAGNOSIS — D0512 Intraductal carcinoma in situ of left breast: Secondary | ICD-10-CM

## 2022-07-05 DIAGNOSIS — Z9012 Acquired absence of left breast and nipple: Secondary | ICD-10-CM | POA: Diagnosis not present

## 2022-07-05 DIAGNOSIS — Z9221 Personal history of antineoplastic chemotherapy: Secondary | ICD-10-CM | POA: Insufficient documentation

## 2022-07-05 DIAGNOSIS — C50512 Malignant neoplasm of lower-outer quadrant of left female breast: Secondary | ICD-10-CM

## 2022-07-05 DIAGNOSIS — Z853 Personal history of malignant neoplasm of breast: Secondary | ICD-10-CM | POA: Insufficient documentation

## 2022-07-05 DIAGNOSIS — Z923 Personal history of irradiation: Secondary | ICD-10-CM | POA: Insufficient documentation

## 2022-07-05 LAB — CBC WITH DIFFERENTIAL (CANCER CENTER ONLY)
Abs Immature Granulocytes: 0.01 10*3/uL (ref 0.00–0.07)
Basophils Absolute: 0 10*3/uL (ref 0.0–0.1)
Basophils Relative: 1 %
Eosinophils Absolute: 0.1 10*3/uL (ref 0.0–0.5)
Eosinophils Relative: 2 %
HCT: 38.4 % (ref 36.0–46.0)
Hemoglobin: 12.7 g/dL (ref 12.0–15.0)
Immature Granulocytes: 0 %
Lymphocytes Relative: 44 %
Lymphs Abs: 2.3 10*3/uL (ref 0.7–4.0)
MCH: 29.3 pg (ref 26.0–34.0)
MCHC: 33.1 g/dL (ref 30.0–36.0)
MCV: 88.7 fL (ref 80.0–100.0)
Monocytes Absolute: 0.4 10*3/uL (ref 0.1–1.0)
Monocytes Relative: 7 %
Neutro Abs: 2.4 10*3/uL (ref 1.7–7.7)
Neutrophils Relative %: 46 %
Platelet Count: 201 10*3/uL (ref 150–400)
RBC: 4.33 MIL/uL (ref 3.87–5.11)
RDW: 13.9 % (ref 11.5–15.5)
WBC Count: 5.3 10*3/uL (ref 4.0–10.5)
nRBC: 0 % (ref 0.0–0.2)

## 2022-07-05 LAB — CMP (CANCER CENTER ONLY)
ALT: 15 U/L (ref 0–44)
AST: 20 U/L (ref 15–41)
Albumin: 4.3 g/dL (ref 3.5–5.0)
Alkaline Phosphatase: 104 U/L (ref 38–126)
Anion gap: 7 (ref 5–15)
BUN: 19 mg/dL (ref 8–23)
CO2: 31 mmol/L (ref 22–32)
Calcium: 9.9 mg/dL (ref 8.9–10.3)
Chloride: 103 mmol/L (ref 98–111)
Creatinine: 0.79 mg/dL (ref 0.44–1.00)
GFR, Estimated: >60 mL/min (ref >60)
Glucose, Bld: 105 mg/dL — ABNORMAL HIGH (ref 70–99)
Potassium: 4.3 mmol/L (ref 3.5–5.1)
Sodium: 141 mmol/L (ref 135–145)
Total Bilirubin: 0.7 mg/dL (ref 0.3–1.2)
Total Protein: 7.2 g/dL (ref 6.5–8.1)

## 2022-07-05 MED ORDER — ANASTROZOLE 1 MG PO TABS: 1.0000 mg | ORAL_TABLET | Freq: Every day | ORAL | 3 refills | Status: DC

## 2022-07-05 NOTE — Assessment & Plan Note (Addendum)
Dr. Virgie Dad patient to establish oncology care with me 2008: Left lumpectomy and ALND: T2N3 IDC, triple negative, postchemotherapy and radiation 12/05/2017: Left lumpectomy: T1 a N0 stage Ia IDC ER/PR positive HER2 negative along with DCIS ER/PR positive, genetics: Negative 03/07/2018: Left mastectomy with D IEP reconstruction: No residual cancer  Current treatment: Anastrozole started 12/25/2017 Anastrozole toxicities: 1.  Denies any hot flashes. Denies arthralgias or myalgias.  We discussed the duration of antiestrogen therapy is between 5 to 7 years.  We would like to send for breast cancer index to determine if she would benefit from extended endocrine therapy.  I will call her with the result of this test.  Breast cancer surveillance: 1.  Breast exam 07/05/2022: Benign 2. mammogram right breast at [facility] 07/05/2022: Results are not available  Return to clinic in 1 year for follow-up

## 2022-07-06 ENCOUNTER — Encounter: Payer: Self-pay | Admitting: *Deleted

## 2022-07-06 NOTE — Progress Notes (Signed)
Per MD request, RN successfully faxed [facility] request [phone]).

## 2022-07-06 NOTE — Progress Notes (Signed)
Orders entered for [facility] testing per MD. Requisition and all supporting documents faxed with fax confirmation.

## 2022-07-08 ENCOUNTER — Encounter: Payer: Self-pay | Admitting: Hematology and Oncology

## 2022-07-12 ENCOUNTER — Ambulatory Visit (INDEPENDENT_AMBULATORY_CARE_PROVIDER_SITE_OTHER): Payer: Medicare Other

## 2022-07-12 ENCOUNTER — Encounter: Payer: Self-pay | Admitting: Orthopaedic Surgery

## 2022-07-12 ENCOUNTER — Ambulatory Visit (INDEPENDENT_AMBULATORY_CARE_PROVIDER_SITE_OTHER): Payer: Medicare Other | Admitting: Orthopaedic Surgery

## 2022-07-12 DIAGNOSIS — I2510 Atherosclerotic heart disease of native coronary artery without angina pectoris: Secondary | ICD-10-CM

## 2022-07-12 DIAGNOSIS — Z96651 Presence of right artificial knee joint: Secondary | ICD-10-CM

## 2022-07-12 NOTE — Progress Notes (Signed)
The patient is a 66 year old female who is now 4 months status post a right total knee arthroplasty.  She says she is coming along and reports better motion and strength.  She had dealt with a little bit of foot drop after surgery but she said that is done very well except for her big toe on the right side.  On exam her flexion and extension are almost full.  The knee feels limply stable without right knee.  She has 5 out of 5 strength of her ankle dorsiflexion but her great toe still is weak and cannot dorsiflex yet but hopefully this will improve with time.  2 views of the right knee show well-seated total knee arthroplasty with no complicating features.  She has a little bit of narrowing within her left knee joint when that can be seen on the standing AP films.  From my standpoint the next time we need to see her is not for 6 months unless she is having issues.  We will have a final AP and lateral of her right knee at that visit.  All questions and concerns were answered and addressed.

## 2022-07-26 ENCOUNTER — Encounter (HOSPITAL_COMMUNITY): Payer: Self-pay

## 2022-07-26 ENCOUNTER — Telehealth: Payer: Self-pay | Admitting: Hematology and Oncology

## 2022-07-26 NOTE — Telephone Encounter (Signed)
Telephone call I informed the patient that the breast cancer index predicts for benefit from extended endocrine therapy. Risk of recurrence: 3.4% versus 1.1% I recommended continuing for 2 more years.

## 2022-08-30 ENCOUNTER — Telehealth: Payer: Self-pay | Admitting: Nurse Practitioner

## 2022-08-30 NOTE — Telephone Encounter (Signed)
   Pts husband called this evening.  While wrapping Christmas presents, pt became mildly lightheaded. She laid down and LH improved.  BP trending 998-338 systolic.  HR low 80's.  No chest pain, dyspnea, palpitations.  Pts husband placed pulse ox on during phone call.  I do hear some irregularity, though can't be sure if this is sinus w/ PACs vs afib based on limited ability to hear over he phone.  I advised that if symptoms persist/progress tonight, she should be seen in the ED.  If however, symptoms subside but there is still some irregularity no pulse-ox in the AM, she should call the afib clinic and arrange to come in for a 12 lead, as she is not on [facility] @ this time.  Caller verbalized understanding and was grateful for the call back.  Murray Hodgkins, NP 08/30/2022, 6:53 PM

## 2022-09-02 ENCOUNTER — Ambulatory Visit (HOSPITAL_COMMUNITY)
Admission: RE | Admit: 2022-09-02 | Discharge: 2022-09-02 | Disposition: A | Discharge status: Home / Self Care | Payer: Medicare Other | Source: Ambulatory Visit | Attending: Physician Assistant | Admitting: Physician Assistant

## 2022-09-02 VITALS — BP 140/86 | HR 93 | Ht 65.0 in | Wt 215.8 lb

## 2022-09-02 DIAGNOSIS — I110 Hypertensive heart disease with heart failure: Secondary | ICD-10-CM | POA: Diagnosis not present

## 2022-09-02 DIAGNOSIS — G4733 Obstructive sleep apnea (adult) (pediatric): Secondary | ICD-10-CM | POA: Diagnosis not present

## 2022-09-02 DIAGNOSIS — I5020 Unspecified systolic (congestive) heart failure: Secondary | ICD-10-CM | POA: Diagnosis not present

## 2022-09-02 DIAGNOSIS — I480 Paroxysmal atrial fibrillation: Secondary | ICD-10-CM

## 2022-09-02 MED ORDER — METOPROLOL TARTRATE 25 MG PO TABS: 25.0000 mg | ORAL_TABLET | Freq: Two times a day (BID) | ORAL | 3 refills | Status: AC | PRN

## 2022-09-02 NOTE — Progress Notes (Signed)
Primary Care Physician: Carol Ada, MD Referring Physician: Dr. Rayann Heman( previous Dr. Caryl Comes)   Linda Foster is a 66 y.o. female with a h/o paroxysmal afib that was seen  in the afib clinic early fall  for afib being found at time of preop for hip surgery .  BB was restarted and she was in SR today when seen in the clinic.  She went on to have her hip surgery and did well without any issues with afib.  She has felt a few flutters since  then but short lived. Not on anticoagulation with CHA2DS2VASc score of 1 for female.  F/u in afib clinic, 05/28/20. Pt is here as she had irregular heart beat that started Tuesday pm after returning from  the beach. She considered taking extra metoprolol but did not. She is not on anticoagulation as she has a CHA2DS2VASc score of 1. She feels better today and EKG shows SR. No alcohol, heavy caffeine use, no tobacco use.   Follow up in the AF clinic 11/25/20. Patient reports that around 3:30 AM today, she felt much more SOB and she checked her heart rate which was around 160 bpm. There were no specific triggers that she could identify. She is not currently on anticoagulation.   F/u in afib clnic, 12/05/20. She  had a successful cardioversion  3/21 but returned to afib this am. HR was 150 bpm before her meds this am now atrial flutter at 125 bpm. She is currently on 75 mg metoprolol bid. We discussed options going forward to manage afib. She continues on eliquis 5 mg bid for a CHA2DS2VASc score of 1.   F/u in afib clinic, 7/19. She is one month s/p ablation and has not noted any afib. No swallowing or groin issues. Continues to feel slightly winded.   Follow up in the AF clinic 04/15/22. Patient reports that she has done well since her last visit. She has not had any episodes of afib. She had knee surgery about 6 weeks ago and is recovering well.   Follow up in the AF clinic 09/02/22. Patient called triage 08/30/22 after having a lightheaded spell. She checked her  pulse oximeter which showed normal heart rate but irregular beat. Her ECG shows SR today. She is feeling back to normal. There were no specific triggers that she could identify.   Today, she denies symptoms of palpitations, chest pain, orthopnea, PND, lower extremity edema, presyncope, syncope, or neurologic sequela. The patient is tolerating medications without difficulties and is otherwise without complaint today.   Past Medical History:  Diagnosis Date   Breast cancer ([place]) 2008   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   Breast cancer ([place]) 2019   left ER/PR +,  XRT and chemotherapy   Dysrhythmia    Hx of A.Fib   Family history of adverse reaction to anesthesia    N&V   Family history of prostate cancer    Lymphedema 2013   OSA (obstructive sleep apnea)    uses a dental appliance   Osteoarthritis    Persistent atrial fibrillation (HCC)    History of Afib   PONV (postoperative nausea and vomiting)    Past Surgical History:  Procedure Laterality Date   ABDOMINAL HYSTERECTOMY     ATRIAL FIBRILLATION ABLATION N/A 03/03/2021   Procedure: ATRIAL FIBRILLATION ABLATION;  Surgeon: Thompson Grayer, MD;  Location: [facility];  Service: Cardiovascular;  Laterality: N/A;   BREAST LUMPECTOMY Left 2008   BREAST LUMPECTOMY WITH RADIOACTIVE SEED  LOCALIZATION Left 12/05/2017   Procedure: BREAST LUMPECTOMY WITH RADIOACTIVE SEED LOCALIZATION;  Surgeon: Rolm Bookbinder, MD;  Location: [place];  Service: General;  Laterality: Left;   BREAST RECONSTRUCTION Left 2019   no BP on Lt arm   CARDIOVERSION N/A 12/01/2020   Procedure: CARDIOVERSION;  Surgeon: Larey Dresser, MD;  Location: [facility] ENDOSCOPY;  Service: Cardiovascular;  Laterality: N/A;   CESAREAN SECTION      x 3   KNEE ARTHROSCOPY Right 2007   TEE WITHOUT CARDIOVERSION N/A 12/01/2020   Procedure: TRANSESOPHAGEAL ECHOCARDIOGRAM (TEE);  Surgeon: Larey Dresser, MD;  Location: [facility] ENDOSCOPY;  Service: Cardiovascular;  Laterality:  N/A;   TOTAL HIP ARTHROPLASTY Left 06/15/2019   Procedure: LEFT TOTAL HIP ARTHROPLASTY ANTERIOR APPROACH;  Surgeon: Mcarthur Rossetti, MD;  Location: [facility];  Service: Orthopedics;  Laterality: Left;   TOTAL KNEE ARTHROPLASTY Right 02/25/2022   Procedure: RIGHT TOTAL KNEE ARTHROPLASTY;  Surgeon: Mcarthur Rossetti, MD;  Location: [place];  Service: Orthopedics;  Laterality: Right;    Current Outpatient Medications  Medication Sig Dispense Refill   anastrozole (ARIMIDEX) 1 MG tablet Take 1 tablet (1 mg total) by mouth daily. 90 tablet 3   APPLE CIDER VINEGAR PO Take 2-3 tablets by mouth daily.     b complex vitamins capsule Take 1 capsule by mouth daily.     cholecalciferol (VITAMIN D3) 25 MCG (1000 UNIT) tablet Take 1,000 Units by mouth daily.     furosemide (LASIX) 40 MG tablet Take 1 tablet (40 mg total) by mouth daily. 90 tablet 3   Glucosamine HCl (GLUCOSAMINE PO) Take 2 tablets by mouth every morning.     Multiple Vitamin tablet Take 2 tablets by mouth daily.     naproxen (NAPRELAN) 500 MG 24 hr tablet Take 500 mg by mouth daily with breakfast.     No current facility-administered medications for this encounter.    No Known Allergies  Social History   Socioeconomic History   Marital status: Married    Spouse name: Not on file   Number of children: Not on file   Years of education: Not on file   Highest education level: Not on file  Occupational History   Not on file  Tobacco Use   Smoking status: Never   Smokeless tobacco: Never   Tobacco comments:    Never smoke 04/15/22  Vaping Use   Vaping Use: Never used  Substance and Sexual Activity   Alcohol use: No   Drug use: No   Sexual activity: Not on file  Other Topics Concern   Not on file  Social History Narrative   Not on file   Social Determinants of Health   Financial Resource Strain: Not on file  Food Insecurity: Not on file  Transportation Needs: Not on file  Physical Activity: Not on file   Stress: Not on file  Social Connections: Not on file  Intimate Partner Violence: Not on file    Family History  Problem Relation Age of Onset   Hypertension Mother    COPD Father    Prostate cancer Father 79   Lung cancer Brother 6   Prostate cancer Brother 36   Cancer Cousin        type unk dx. >50   Cancer Cousin 61       type unk   Cancer Cousin        type and age dx unkown    ROS- All systems are reviewed and  negative except as per the HPI above  Physical Exam: Vitals:   09/02/22 1429  BP: (!) 140/86  Pulse: 93  Weight: 97.9 kg  Height: '5\' 5"'$  (1.651 m)    Wt Readings from Last 3 Encounters:  09/02/22 97.9 kg  07/05/22 95.5 kg  04/15/22 93.4 kg    Labs: Lab Results  Component Value Date   NA 141 07/05/2022   K 4.3 07/05/2022   CL 103 07/05/2022   CO2 31 07/05/2022   GLUCOSE 105 (H) 07/05/2022   BUN 19 07/05/2022   CREATININE 0.79 07/05/2022   CALCIUM 9.9 07/05/2022   MG 2.2 11/02/2016   Lab Results  Component Value Date   INR 1.02 11/03/2016   Lab Results  Component Value Date   CHOL 232 (H) 11/03/2016   HDL 74 11/03/2016   LDLCALC 145 (H) 11/03/2016   TRIG 63 11/03/2016    GEN- The patient is a well appearing female, alert and oriented x 3 today.   HEENT-head normocephalic, atraumatic, sclera clear, conjunctiva pink, hearing intact, trachea midline. Lungs- Clear to ausculation bilaterally, normal work of breathing Heart- Regular rate and rhythm, no murmurs, rubs or gallops  GI- soft, NT, ND, + BS Extremities- no clubbing, cyanosis, or edema MS- no significant deformity or atrophy Skin- no rash or lesion Psych- euthymic mood, full affect Neuro- strength and sensation are intact   EKG-  SR Vent. rate 93 BPM PR interval 138 ms QRS duration 86 ms QT/QTcB 358/445 ms   Echo 09/15/21  1. Left ventricular ejection fraction, by estimation, is 60 to 65%. The  left ventricle has normal function. The left ventricle has no regional  wall  motion abnormalities. Left ventricular diastolic parameters were  normal.   2. Right ventricular systolic function is normal. The right ventricular  size is normal. There is normal pulmonary artery systolic pressure.   3. Left atrial size was moderately dilated.   4. The mitral valve is normal in structure. Trivial mitral valve  regurgitation. No evidence of mitral stenosis.   5. The aortic valve is tricuspid. There is mild calcification of the  aortic valve. There is mild thickening of the aortic valve. Aortic valve  regurgitation is not visualized. No aortic stenosis is present.   6. The inferior vena cava is normal in size with greater than 50%  respiratory variability, suggesting right atrial pressure of 3 mmHg.   Epic records reviewed   Assessment and Plan: 1. Paroxysmal afib S/p ablation 03/03/21 Patient in [place] and feeling well today.  We discussed getting a smart device, she actually has a [employer] mobile but forgot to use it.  She discontinued daily metoprolol. Will use 25 mg q 6 hours PRN for heart racing.   2.Chadsvasc score of 2 Not currently on anticoagulation with low CV score.  3. OSA Followed by [facility]  Encouraged compliance with CPAP therapy.  4. HFrEF EF recovered with SR, suspected tachycardia mediated.  Appears euvolemic.   Follow up in the AF clinic in one year.    [facility] [address] [place], [place] [address] [phone]

## 2022-10-21 ENCOUNTER — Encounter (HOSPITAL_COMMUNITY): Payer: Self-pay | Admitting: *Deleted

## 2022-11-18 ENCOUNTER — Encounter: Payer: Self-pay | Admitting: Radiology

## 2023-01-13 ENCOUNTER — Ambulatory Visit: Payer: Medicare Other | Admitting: Orthopaedic Surgery

## 2023-04-29 NOTE — Progress Notes (Signed)
Patient doing well

## 2023-07-06 ENCOUNTER — Ambulatory Visit: Payer: Medicare Other | Admitting: Hematology and Oncology

## 2023-08-10 ENCOUNTER — Inpatient Hospital Stay: Payer: Medicare Other | Attending: Hematology and Oncology | Admitting: Hematology and Oncology

## 2023-08-10 NOTE — Assessment & Plan Note (Deleted)
Dr. Darrall Dears patient previously 2008: Left lumpectomy and ALND: T2N3 IDC, triple negative, postchemotherapy and radiation 12/05/2017: Left lumpectomy: T1 a N0 stage Ia IDC ER/PR positive HER2 negative along with DCIS ER/PR positive, genetics: Negative 03/07/2018: Left mastectomy with D IEP reconstruction: No residual cancer   Current treatment: Anastrozole started 12/25/2017 Anastrozole toxicities: 1.  Denies any hot flashes. Denies arthralgias or myalgias.   Breast cancer index: Predicted for benefit from extended endocrine therapy Risk of recurrence: 3.4% versus 1.1%  Recommended continuing antiestrogen therapy for at least 7 years   Breast cancer surveillance: 1.  Breast exam 07/05/2022: Benign 2. mammogram right breast at [facility] 07/05/2022: This will be done at a later time today.   Return to clinic in 1 year for follow-up

## 2023-09-24 ENCOUNTER — Other Ambulatory Visit: Payer: Self-pay | Admitting: Hematology and Oncology

## 2024-01-26 ENCOUNTER — Telehealth: Payer: Self-pay | Admitting: Hematology and Oncology

## 2024-01-26 NOTE — Telephone Encounter (Signed)
 Left patient a vm regarding upcoming appointment

## 2024-02-02 ENCOUNTER — Ambulatory Visit: Admitting: Hematology and Oncology

## 2024-02-14 ENCOUNTER — Inpatient Hospital Stay: Attending: Hematology and Oncology | Admitting: Hematology and Oncology

## 2024-02-14 VITALS — BP 118/60 | HR 85 | Temp 98.5°F | Resp 18 | Ht 65.0 in | Wt 184.5 lb

## 2024-02-14 DIAGNOSIS — Z79811 Long term (current) use of aromatase inhibitors: Secondary | ICD-10-CM | POA: Diagnosis not present

## 2024-02-14 DIAGNOSIS — Z9012 Acquired absence of left breast and nipple: Secondary | ICD-10-CM | POA: Diagnosis not present

## 2024-02-14 DIAGNOSIS — Z923 Personal history of irradiation: Secondary | ICD-10-CM | POA: Insufficient documentation

## 2024-02-14 DIAGNOSIS — D0512 Intraductal carcinoma in situ of left breast: Secondary | ICD-10-CM

## 2024-02-14 MED ORDER — ESTRADIOL 0.1 MG/GM VA CREA: 1.0000 | TOPICAL_CREAM | VAGINAL | 12 refills | Status: AC

## 2024-02-14 NOTE — Assessment & Plan Note (Signed)
 Dr. Mikel Alderman patient originally 2008: Left lumpectomy and ALND: T2N3 IDC, triple negative, postchemotherapy and radiation 12/05/2017: Left lumpectomy: T1 a N0 stage Ia IDC ER/PR positive HER2 negative along with DCIS ER/PR positive, genetics: Negative 03/07/2018: Left mastectomy with D IEP reconstruction: No residual cancer   Current treatment: Anastrozole  started 12/25/2017 Anastrozole  toxicities: 1.  Denies any hot flashes. Denies arthralgias or myalgias.   We discussed the duration of antiestrogen therapy is between 5 to 7 years.  We would like to send for breast cancer index to determine if she would benefit from extended endocrine therapy.  I will call her with the result of this test.   Breast cancer surveillance: 1.  Breast exam 02/14/2024: Benign 2. mammogram right breast at [facility] 07/05/2022: This will be done at a later time today.   Return to clinic in 1 year for follow-up

## 2024-02-14 NOTE — Progress Notes (Signed)
 Patient Care Team: Faustina Hood, MD as PCP - General (Family Medicine) Rogene Claude, MD as Consulting Physician (Obstetrics and Gynecology) Rozelle Corning, Maricela Shoe, MD as Consulting Physician (Orthopedic Surgery) Enid Harry, MD as Consulting Physician (General Surgery) Magrinat, Rozella Cornfield, MD (Inactive) as Consulting Physician (Oncology) Alger Infield, MD as Consulting Physician (Plastic Surgery) Jonita Neth, MD (Plastic Surgery) Arty Binning, MD (Inactive) as Consulting Physician (Cardiology) Arty Binning, MD (Inactive) as Consulting Physician (Cardiology) Arnie Lao, MD as Consulting Physician (Orthopedic Surgery)  DIAGNOSIS:  Encounter Diagnosis  Name Primary?   Ductal carcinoma in situ (DCIS) of left breast Yes    SUMMARY OF ONCOLOGIC HISTORY: Oncology History Overview Note  Cancer Staging Ductal carcinoma in situ (DCIS) of left breast Staging form: Breast, AJCC 8th Edition - Clinical stage from 10/20/2017: Stage 0 (cTis (DCIS), cN0, cM0, ER: Positive, PR: Positive, HER2: Not assessed ) - Signed by Sonja Menlo, MD on 10/25/2017  2008, left breast pathological stage: T2 N1  triple negative invasive ductal carcinoma, grade 3.   History of invasive breast cancer  08/25/2006 Imaging   MR breast bilateral IMPRESSION: 4.9 cm mass within the upper outer quadrant of the left breast, consistent with known malignancy. Enlarged lower axillary lymph node on the left, consistent with known metastatic disease.   08/27/2006 Imaging   PET Scan IMPRESSION: The left breast primary and left axillary adenopathy are both hypermetabolic. No other areas of hypermetabolism suggest metastatic disease. Neither of the two large liver lesions is hypermetabolic.  Further, a left hepatic dome lesion is partially imaged on the breast MR is more consistent with a hemangioma or less likely cyst.  These lesions could be more entirely evaluated with dedicated hepatic MR or  reevaluated on follow-up PET.    09/16/2006 - 11/04/2006 Chemotherapy   The patient had 4 cycles of chemotherapy with FEC for chemotherapy treatment.     11/17/2006 Imaging   MR breast bilateral IMPRESSION: No residual enhancement of the left upper outer quadrant breast cancer.  The previously seen left axillary adenopathy has also resolved.   11/18/2006 - 12/02/2006 Chemotherapy   The patient had 2 cycles of chemotherapy with Taxotere for chemotherapy treatment.     12/16/2006 - 01/06/2007 Chemotherapy   The patient had  2 cycles of chemotherapy with Abraxane for chemotherapy treatment.     01/09/2007 Imaging   MR breast bilateral IMPRESSION: No residual enhancement in the upper outer quadrant of the left breast.  Left axillary adenopathy is no longer present.   02/01/2007 Initial Diagnosis   History of left breast cancer   2008 Mammogram   Routine screening mammogram IMPRESSION: Palpable mass 3 cm by lobe with this centimeter focus of calcifications. Ultrasound confirmed the presence of a hypoechoic mass measuring about 5 cm in maximum diameter. There was a 2 cm hypoechoic lymph nodes seen.    02/01/2007 Pathology Results   LEFT BREAST, NEEDLE LOCALIZATION BIOPSY IMPRESSION: DENSE FIBROSIS. NO RESIDUAL TUMOR IDENTIFIED. LYMPH NODE, LEFT AXILLARY, EXCISION: NO TUMOR IDENTIFIED IN TEN LYMPH NODES, SEE COMMENT   2008 Pathology Results    Biopsy: Both breasts and lymph nodes showed metastatic carcinoma. ER/PR positive, Ki-67 52%, HER-2/neu 1+.     03/28/2007 - 04/21/2007 Radiation Therapy   Radiation Therapy treatment   Ductal carcinoma in situ (DCIS) of left breast  10/13/2017 Mammogram   Unilateral left diagnostic mammography with tomography and left breast ultrasonography IMPRESSION: The new grouped calcifications in the left breast are suspicious of malignancy.  10/20/2017 Pathology Results   Left needle core biopsy with pathology showing: IMPRESSION: Breast, left, needle core biopsy with  ductal carcinoma in situ (DCIS), intermediate nuclear grade, solid and cribriform type with central necrosis and associated microcalcifications. PROGNOSTIC INDICATORS Results: IMMUNOHISTOCHEMICAL AND MORPHOMETRIC ANALYSIS PERFORMED MANUALLY Estrogen Receptor: 95%, POSITIVE, STRONG STAINING INTENSITY Progesterone Receptor: 80%, POSITIVE, STRONG STAINING INTENSITY   10/24/2017 Initial Diagnosis   Ductal carcinoma in situ (DCIS) of left breast   11/26/2017 Genetic Testing   The Common Hereditary Cancer Panel offered by [facility] includes sequencing and/or deletion duplication testing of the following 47 genes: APC, ATM, AXIN2, BARD1, BMPR1A, BRCA1, BRCA2, BRIP1, CDH1, CDKN2A (p14ARF), CDKN2A (p16INK4a), CKD4, CHEK2, CTNNA1, DICER1, EPCAM (Deletion/duplication testing only), GREM1 (promoter region deletion/duplication testing only), KIT, MEN1, MLH1, MSH2, MSH3, MSH6, MUTYH, NBN, NF1, NHTL1, PALB2, PDGFRA, PMS2, POLD1, POLE, PTEN, RAD50, RAD51C, RAD51D, SDHB, SDHC, SDHD, SMAD4, SMARCA4. STK11, TP53, TSC1, TSC2, and VHL.  The following genes were evaluated for sequence changes only: SDHA and HOXB13 c.251G>A variant only.  Results: No pathogenic variants identified.  Variants of uncertain significance were identified in the genes APC c.4088A>G (p.Lys1363Arg) and BRIP1 c.1655T>C (p.Ile552Thr).  These variants should not be used to alter medical management.  The date of this test report is 11/26/2017.      CHIEF COMPLIANT: Follow-up on antiestrogen therapy  HISTORY OF PRESENT ILLNESS:  History of Present Illness Linda Foster is a 68 year old female with a history of ductal carcinoma in situ and invasive ductal carcinoma of the left breast who presents for a routine follow-up visit.  She is currently on anastrozole  1 mg orally daily for the management of ductal carcinoma in situ (DCIS) of the left breast. She is due for her annual mammogram, typically scheduled at [facility] around November.  Her history of  invasive ductal carcinoma of the left breast is being monitored alongside the DCIS to ensure no recurrence or progression. Regular follow-up and imaging are part of her management plan.     ALLERGIES:  has no known allergies.  MEDICATIONS:  Current Outpatient Medications  Medication Sig Dispense Refill   acetaminophen  (TYLENOL ) 500 MG tablet Take 500 mg by mouth.     anastrozole  (ARIMIDEX ) 1 MG tablet TAKE 1 TABLET BY MOUTH EVERY DAY 90 tablet 3   APPLE CIDER VINEGAR PO Take 2-3 tablets by mouth daily.     b complex vitamins capsule Take 1 capsule by mouth daily.     cholecalciferol  (VITAMIN D3) 25 MCG (1000 UNIT) tablet Take 1,000 Units by mouth daily.     furosemide  (LASIX ) 40 MG tablet Take 1 tablet (40 mg total) by mouth daily. 90 tablet 3   Glucosamine HCl (GLUCOSAMINE PO) Take 2 tablets by mouth every morning.     Multiple Vitamin tablet Take 2 tablets by mouth daily.     naproxen (NAPRELAN) 500 MG 24 hr tablet Take 500 mg by mouth daily with breakfast.     metoprolol  tartrate (LOPRESSOR ) 25 MG tablet Take 1 tablet (25 mg total) by mouth 2 (two) times daily as needed. 180 tablet 3   No current facility-administered medications for this visit.    PHYSICAL EXAMINATION: ECOG PERFORMANCE STATUS: 1 - Symptomatic but completely ambulatory  Vitals:   02/14/24 1500  BP: 118/60  Pulse: 85  Resp: 18  Temp: 98.5 F (36.9 C)  SpO2: 99%   Filed Weights   02/14/24 1500  Weight: 184 lb 8 oz (83.7 kg)    Physical Exam No palpable  lumps nodules bilateral breasts or axilla  (exam performed in the presence of a chaperone)  LABORATORY DATA:  I have reviewed the data as listed    Latest Ref Rng & Units 07/05/2022   11:10 AM 02/26/2022    4:59 AM 02/18/2022    8:31 AM  CMP  Glucose 70 - 99 mg/dL 540  981  191   BUN 8 - 23 mg/dL 19  12  14    Creatinine 0.44 - 1.00 mg/dL 4.78  2.95  6.21   Sodium 135 - 145 mmol/L 141  138  142   Potassium 3.5 - 5.1 mmol/L 4.3  4.8  3.9   Chloride  98 - 111 mmol/L 103  104  104   CO2 22 - 32 mmol/L 31  26  29    Calcium  8.9 - 10.3 mg/dL 9.9  9.1  9.7   Total Protein 6.5 - 8.1 g/dL 7.2     Total Bilirubin 0.3 - 1.2 mg/dL 0.7     Alkaline Phos 38 - 126 U/L 104     AST 15 - 41 U/L 20     ALT 0 - 44 U/L 15       Lab Results  Component Value Date   WBC 5.3 07/05/2022   HGB 12.7 07/05/2022   HCT 38.4 07/05/2022   MCV 88.7 07/05/2022   PLT 201 07/05/2022   NEUTROABS 2.4 07/05/2022    ASSESSMENT & PLAN:  Ductal carcinoma in situ (DCIS) of left breast Dr. Mikel Alderman patient originally 2008: Left lumpectomy and ALND: T2N3 IDC, triple negative, postchemotherapy and radiation 12/05/2017: Left lumpectomy: T1 a N0 stage Ia IDC ER/PR positive HER2 negative along with DCIS ER/PR positive, genetics: Negative 03/07/2018: Left mastectomy with D IEP reconstruction: No residual cancer   Current treatment: Anastrozole  started 12/25/2017 Anastrozole  toxicities: 1.  Denies any hot flashes. Denies arthralgias or myalgias.   We discussed the duration of antiestrogen therapy is between 5 to 7 years.  Breast cancer index: With 10 years of antiestrogen therapy risk of recurrence: 1.1 to 1.4%, BCI predictive benefit for extended endocrine therapy.   Breast cancer surveillance: 1.  Breast exam 02/14/2024: Benign 2. mammogram right breast at [facility] 07/05/2022: This will be done at a later time today.   Next year she will complete 7 years of therapy.  Will review the bone density test to make a final decision about extending her antiestrogen therapy further. Return to clinic in 1 year for follow-up  No orders of the defined types were placed in this encounter.  The patient has a good understanding of the overall plan. she agrees with it. she will call with any problems that may develop before the next visit here. Total time spent: 30 mins including face to face time and time spent for planning, charting and co-ordination of care   Viinay K Tyera Hansley,  MD 02/14/24

## 2024-09-19 ENCOUNTER — Other Ambulatory Visit: Payer: Self-pay | Admitting: Hematology and Oncology

## 2024-09-19 ENCOUNTER — Telehealth: Payer: Self-pay | Admitting: Hematology and Oncology

## 2024-09-19 NOTE — Telephone Encounter (Signed)
 left vm for pt to call back concerning a VM she left about an appt

## 2024-10-04 ENCOUNTER — Telehealth: Payer: Self-pay

## 2024-10-04 NOTE — Telephone Encounter (Signed)
 Called pt to r/s pt for 10/16/24 d/t possible weather conditions. She is agreeable. Appt rescheduled.

## 2024-10-08 ENCOUNTER — Inpatient Hospital Stay: Payer: Self-pay | Admitting: Hematology and Oncology

## 2024-10-16 ENCOUNTER — Inpatient Hospital Stay: Payer: Self-pay | Admitting: Hematology and Oncology

## 2024-10-16 VITALS — BP 110/76 | HR 73 | Temp 98.0°F | Resp 16 | Wt 178.0 lb

## 2024-10-16 DIAGNOSIS — C50512 Malignant neoplasm of lower-outer quadrant of left female breast: Secondary | ICD-10-CM

## 2024-10-18 ENCOUNTER — Encounter: Payer: Self-pay | Admitting: Family Medicine

## 2025-02-13 ENCOUNTER — Ambulatory Visit: Admitting: Hematology and Oncology

## 2025-10-16 ENCOUNTER — Inpatient Hospital Stay: Payer: Self-pay | Admitting: Adult Health
